# Patient Record
Sex: Male | Born: 1970 | Race: White | Hispanic: No | Marital: Married | Smoking: Current every day smoker
Health system: Southern US, Community
[De-identification: ages and names within clinical notes are randomized; demographics above are authoritative.]

## PROBLEM LIST (undated history)

## (undated) DIAGNOSIS — N9989 Other postprocedural complications and disorders of genitourinary system: Secondary | ICD-10-CM

## (undated) DIAGNOSIS — K56609 Unspecified intestinal obstruction, unspecified as to partial versus complete obstruction: Secondary | ICD-10-CM

## (undated) DIAGNOSIS — T8859XA Other complications of anesthesia, initial encounter: Secondary | ICD-10-CM

## (undated) DIAGNOSIS — G4700 Insomnia, unspecified: Secondary | ICD-10-CM

## (undated) DIAGNOSIS — Z933 Colostomy status: Secondary | ICD-10-CM

## (undated) DIAGNOSIS — K651 Peritoneal abscess: Secondary | ICD-10-CM

## (undated) DIAGNOSIS — T4145XA Adverse effect of unspecified anesthetic, initial encounter: Secondary | ICD-10-CM

## (undated) DIAGNOSIS — K5792 Diverticulitis of intestine, part unspecified, without perforation or abscess without bleeding: Secondary | ICD-10-CM

## (undated) DIAGNOSIS — Z8639 Personal history of other endocrine, nutritional and metabolic disease: Secondary | ICD-10-CM

## (undated) DIAGNOSIS — Z87442 Personal history of urinary calculi: Secondary | ICD-10-CM

---

## 2000-09-13 ENCOUNTER — Encounter: Payer: Self-pay | Admitting: Emergency Medicine

## 2000-09-13 ENCOUNTER — Emergency Department (HOSPITAL_COMMUNITY): Admission: EM | Admit: 2000-09-13 | Discharge: 2000-09-13 | Payer: Self-pay | Admitting: Emergency Medicine

## 2002-12-31 ENCOUNTER — Emergency Department (HOSPITAL_COMMUNITY): Admission: EM | Admit: 2002-12-31 | Discharge: 2002-12-31 | Payer: Self-pay | Admitting: Emergency Medicine

## 2009-08-25 ENCOUNTER — Emergency Department (HOSPITAL_COMMUNITY): Admission: EM | Admit: 2009-08-25 | Discharge: 2009-08-26 | Payer: Self-pay | Admitting: Emergency Medicine

## 2009-08-25 ENCOUNTER — Ambulatory Visit: Payer: Self-pay | Admitting: Diagnostic Radiology

## 2009-08-25 ENCOUNTER — Emergency Department (HOSPITAL_BASED_OUTPATIENT_CLINIC_OR_DEPARTMENT_OTHER): Admission: EM | Admit: 2009-08-25 | Discharge: 2009-08-25 | Payer: Self-pay | Admitting: Emergency Medicine

## 2010-07-06 LAB — URINE CULTURE
Colony Count: NO GROWTH
Culture: NO GROWTH

## 2010-07-06 LAB — BASIC METABOLIC PANEL
GFR calc Af Amer: >60 mL/min (ref >60)
GFR calc non Af Amer: >60 mL/min (ref >60)
Potassium: 3.8 mEq/L (ref 3.5–5.1)
Sodium: 142 mEq/L (ref 135–145)

## 2010-07-06 LAB — URINALYSIS, ROUTINE W REFLEX MICROSCOPIC
Bilirubin Urine: NEGATIVE
Glucose, UA: NEGATIVE mg/dL
Hgb urine dipstick: NEGATIVE
Ketones, ur: NEGATIVE mg/dL
Nitrite: POSITIVE — AB
Protein, ur: NEGATIVE mg/dL
Specific Gravity, Urine: 1.021 (ref 1.005–1.030)
Urobilinogen, UA: 1 mg/dL (ref 0.0–1.0)
pH: 6 (ref 5.0–8.0)

## 2010-07-06 LAB — URINE MICROSCOPIC-ADD ON

## 2011-03-27 ENCOUNTER — Ambulatory Visit: Payer: Self-pay

## 2011-12-23 ENCOUNTER — Emergency Department (HOSPITAL_COMMUNITY): Payer: Medicaid Other

## 2011-12-23 ENCOUNTER — Encounter (HOSPITAL_COMMUNITY): Payer: Self-pay | Admitting: *Deleted

## 2011-12-23 ENCOUNTER — Inpatient Hospital Stay (HOSPITAL_COMMUNITY)
Admission: EM | Admit: 2011-12-23 | Discharge: 2011-12-26 | DRG: 690 | Disposition: A | Discharge status: Home / Self Care | Payer: Medicaid Other | Attending: Internal Medicine | Admitting: Internal Medicine

## 2011-12-23 DIAGNOSIS — K5792 Diverticulitis of intestine, part unspecified, without perforation or abscess without bleeding: Secondary | ICD-10-CM

## 2011-12-23 DIAGNOSIS — F17200 Nicotine dependence, unspecified, uncomplicated: Secondary | ICD-10-CM | POA: Diagnosis present

## 2011-12-23 DIAGNOSIS — N390 Urinary tract infection, site not specified: Principal | ICD-10-CM | POA: Diagnosis present

## 2011-12-23 DIAGNOSIS — D72829 Elevated white blood cell count, unspecified: Secondary | ICD-10-CM | POA: Diagnosis present

## 2011-12-23 DIAGNOSIS — R109 Unspecified abdominal pain: Secondary | ICD-10-CM | POA: Diagnosis present

## 2011-12-23 DIAGNOSIS — K5732 Diverticulitis of large intestine without perforation or abscess without bleeding: Secondary | ICD-10-CM

## 2011-12-23 DIAGNOSIS — K56699 Other intestinal obstruction unspecified as to partial versus complete obstruction: Secondary | ICD-10-CM | POA: Diagnosis present

## 2011-12-23 DIAGNOSIS — E860 Dehydration: Secondary | ICD-10-CM | POA: Diagnosis present

## 2011-12-23 DIAGNOSIS — K5900 Constipation, unspecified: Secondary | ICD-10-CM | POA: Diagnosis present

## 2011-12-23 DIAGNOSIS — K5289 Other specified noninfective gastroenteritis and colitis: Secondary | ICD-10-CM | POA: Diagnosis present

## 2011-12-23 HISTORY — DX: Other postprocedural complications and disorders of genitourinary system: N99.89

## 2011-12-23 LAB — COMPREHENSIVE METABOLIC PANEL
AST: 17 U/L (ref 0–37)
CO2: 23 mEq/L (ref 19–32)
Chloride: 98 mEq/L (ref 96–112)
Creatinine, Ser: 0.79 mg/dL (ref 0.50–1.35)
GFR calc non Af Amer: >90 mL/min (ref >90)
Total Bilirubin: 0.4 mg/dL (ref 0.3–1.2)

## 2011-12-23 LAB — CBC WITH DIFFERENTIAL/PLATELET
Basophils Absolute: 0.1 10*3/uL (ref 0.0–0.1)
HCT: 46.7 % (ref 39.0–52.0)
Hemoglobin: 16.6 g/dL (ref 13.0–17.0)
Lymphocytes Relative: 17 % (ref 12–46)
Lymphs Abs: 2.5 10*3/uL (ref 0.7–4.0)
Monocytes Absolute: 1.4 10*3/uL — ABNORMAL HIGH (ref 0.1–1.0)
Monocytes Relative: 9 % (ref 3–12)
Neutro Abs: 11 10*3/uL — ABNORMAL HIGH (ref 1.7–7.7)
RBC: 5.08 MIL/uL (ref 4.22–5.81)
RDW: 12.6 % (ref 11.5–15.5)
WBC: 14.9 10*3/uL — ABNORMAL HIGH (ref 4.0–10.5)

## 2011-12-23 LAB — URINALYSIS, ROUTINE W REFLEX MICROSCOPIC
Glucose, UA: NEGATIVE mg/dL
Protein, ur: NEGATIVE mg/dL

## 2011-12-23 LAB — URINE MICROSCOPIC-ADD ON

## 2011-12-23 MED ORDER — IOHEXOL 300 MG/ML  SOLN
100.0000 mL | Freq: Once | INTRAMUSCULAR | Status: AC | PRN
  Administered 2011-12-23: 100 mL via INTRAVENOUS

## 2011-12-23 MED ORDER — LIDOCAINE HCL 2 % EX GEL
CUTANEOUS | Status: AC
  Administered 2011-12-23: 10
  Filled 2011-12-23: qty 10

## 2011-12-23 MED ORDER — DEXTROSE 5 % IV SOLN
1.0000 g | Freq: Once | INTRAVENOUS | Status: AC
  Administered 2011-12-24: 1 g via INTRAVENOUS
  Filled 2011-12-23: qty 10

## 2011-12-23 MED ORDER — HYDROMORPHONE HCL PF 1 MG/ML IJ SOLN
1.0000 mg | Freq: Once | INTRAMUSCULAR | Status: AC
  Administered 2011-12-23: 1 mg via INTRAVENOUS
  Filled 2011-12-23: qty 1

## 2011-12-23 MED ORDER — ONDANSETRON HCL 4 MG/2ML IJ SOLN
4.0000 mg | Freq: Once | INTRAMUSCULAR | Status: AC
  Administered 2011-12-23: 4 mg via INTRAVENOUS
  Filled 2011-12-23: qty 2

## 2011-12-23 NOTE — ED Notes (Signed)
Pt states that he has been unable to urinate for the past two days. Also, states that he has not been able to have a BM since Saturday.

## 2011-12-23 NOTE — ED Provider Notes (Signed)
History     CSN: [id number]  Arrival date & time 12/23/11  2036   First MD Initiated Contact with Patient 12/23/11 2100      Chief Complaint  Patient presents with  . Urinary Retention    (Consider location/radiation/quality/duration/timing/severity/associated sxs/prior treatment) HPI History provided by pt.   Pt c/o constipation and urinary retention.  Most recent BM 6 days ago and he normally goes daily.  Most recently urinated upon waking this morning, but only ~3oz, and before that it was yesterday morning.  His entire abdomen is painful, tender and distended but he does not feel as though he needs to urinate.  Appetite decreased, has had nausea with retching and has not been passing gas.  Denies fever and other GU sx.  No h/o abd surgeries.  No h/o liver disease and denies edema elsewhere.  Per prior chart, pt presented to ED w/ same in 08/2009.  Dr. 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  He diagnosed him w/ urinary retention.  Pt has had intermittent cystitis as well as prostatitis in the past.    Past Medical History  Diagnosis Date  . Urinary anastomotic stricture undiagnosed     hard to be cathed; urology did the last time    History reviewed. No pertinent past surgical history.  History reviewed. No pertinent family history.  History  Substance Use Topics  . Smoking status: Current Everyday Smoker -- 2.0 packs/day for 20 years    Types: Cigarettes  . Smokeless tobacco: Not on file  . Alcohol Use: No      Review of Systems  All other systems reviewed and are negative.    Allergies  Review of patient's allergies indicates no known allergies.  Home Medications   Current Outpatient Rx  Name Route Sig Dispense Refill  . NAPROXEN SODIUM 220 MG PO TABS Oral Take 440 mg by mouth 2 (two) times daily with a meal.      BP 154/99  Pulse 105  Temp 98.1 F (36.7 C) (Oral)  Resp 16  SpO2  96%  Physical Exam  Nursing note and vitals reviewed. Constitutional: He is oriented to person, place, and time. He appears well-developed and well-nourished.       Uncomfortable appearing.  HENT:  Head: Normocephalic and atraumatic.  Eyes:       Normal appearance  Neck: Normal range of motion.  Cardiovascular: Normal rate and regular rhythm.   Pulmonary/Chest: Effort normal and breath sounds normal. No respiratory distress.  Abdominal: Soft. Bowel sounds are normal. He exhibits distension. There is no tenderness. There is no guarding.       Obese. No surgical scars. Diffuse tenderness to light palpation with possible peritonitis (past w/ light tapping and shaking of hips).    Genitourinary:       No CVA tenderness but vibration from tapping induces abd pain.  Prostate exam limited by body habitus but patient denies tenderness.  Nml stool color and nml rectal tone.  No impaction or gross blood.   Musculoskeletal: Normal range of motion. He exhibits no edema and no tenderness.  Neurological: He is alert and oriented to person, place, and time.  Skin: Skin is warm and dry. No rash noted.       No jaundice  Psychiatric: He has a normal mood and affect. His behavior is normal.    ED Course  Procedures (including critical care time)  Labs Reviewed  CBC  WITH DIFFERENTIAL - Abnormal; Notable for the following:    WBC 14.9 (*)     Platelets 507 (*)     Neutro Abs 11.0 (*)     Monocytes Absolute 1.4 (*)     All other components within normal limits  COMPREHENSIVE METABOLIC PANEL - Abnormal; Notable for the following:    Sodium 134 (*)     Glucose, Bld 118 (*)     All other components within normal limits  LIPASE, BLOOD - Abnormal; Notable for the following:    Lipase 126 (*)     All other components within normal limits  URINALYSIS, ROUTINE W REFLEX MICROSCOPIC - Abnormal; Notable for the following:    Color, Urine ORANGE (*)  BIOCHEMICALS MAY BE AFFECTED BY COLOR   APPearance  CLOUDY (*)     Specific Gravity, Urine 1.034 (*)     Hgb urine dipstick MODERATE (*)     Bilirubin Urine SMALL (*)     Ketones, ur TRACE (*)     Nitrite POSITIVE (*)     Leukocytes, UA SMALL (*)     All other components within normal limits  URINE MICROSCOPIC-ADD ON - Abnormal; Notable for the following:    Bacteria, UA MANY (*)     All other components within normal limits  TSH   Ct Abdomen Pelvis W Contrast  12/23/2011  *RADIOLOGY REPORT*  Clinical Data: Abdominal pain.  Unable to urinate for 2 days.  No bowel movement since Saturday.  CT ABDOMEN AND PELVIS WITH CONTRAST  Technique:  Multidetector CT imaging of the abdomen and pelvis was performed following the standard protocol during bolus administration of intravenous contrast.  Contrast: OMNIPAQUE IOHEXOL 300 MG/ML  SOLN  Comparison: 08/25/2009  Findings: Mild dependent atelectasis in the lung bases.  The diffuse low attenuation change throughout the liver consistent with fatty infiltration.  The spleen, gallbladder, pancreas, adrenal glands, kidneys, abdominal aorta, and retroperitoneal lymph nodes are unremarkable.  No free air or free fluid in the abdomen. Prominent visceral adipose tissues.  Stomach and small bowel are not distended.  Mild gaseous distension of the colon with mild colonic wall thickening diffusely.  There is more prominent colonic wall thickening in the descending portion with pericolonic fatty infiltration around the descending region.  This is new since the previous study.  The colon is diffusely stool filled.  Changes most likely to represent inflammatory process such as infectious colitis, diverticulitis, or inflammatory bowel disease.  There is a focal area of narrowing in the descending region, follow-up evaluation of the colon after acute process has resolved is recommended to exclude underlying neoplasm.  There are a few scattered lymph nodes in the pericolonic region on the left which are not pathologically  enlarged and likely represent reactive nodes.  Pelvis:  There is a Foley catheter in the bladder.  Bladder wall does not appear thickened.  Increased density in the bladder may arise from blood or contrast material.  No free or loculated pelvic fluid collections.  The appendix is normal.  No significant pelvic lymphadenopathy.  Diffuse degenerative changes throughout the thoracolumbar spine. Prominent disc osteophyte complexes at multiple levels cause encroachment upon the thecal sac.  IMPRESSION: Inflammatory process focally involving the descending colon with diffuse colonic wall thickening and dilatation.  Changes suggest inflammatory bowel disease versus infectious colitis or diverticulitis.  Underlying neoplasm should be excluded and follow- up evaluation of the colon  after resolution of acute process is recommend   Original Report Authenticated  By: Marlon Pel, M.D.    Dg Abd Acute W/chest  12/23/2011  *RADIOLOGY REPORT*  Clinical Data: Abdominal pain.  Constipation.  ACUTE ABDOMEN SERIES (ABDOMEN 2 VIEW & CHEST 1 VIEW)  Comparison: None.  Findings: Mild diffuse colonic dilatation is seen.  Some stool is noted in the distal colon and right colon.  This could be due to a colonic ileus or distal colonic obstruction.  There is no evidence of free air.  No evidence of dilated small bowel loops.  Heart size is normal.  Both lungs are clear.  IMPRESSION:  1.  Diffuse colonic dilatation, with some stool seen in the distal and right colon.  Differential diagnosis includes colonic ileus and distal colonic obstruction. 2.  No active cardiopulmonary disease.   Original Report Authenticated By: Danae Orleans, M.D.      1. Diverticulitis   2. Urinary tract infection   3. Constipation   4. Dehydration       MDM  41yo M w/ h/o urinary retention secondary to urethral stricture presents w/ urinary retention x 2d and constipation x6d.  On exam, uncomfortable appearing, abd distended and diffusely tender  w/ possible peritonitis, nml DRE.  Foley inserted and approx 200cc urine removed.  Labs sig for mild leukocytosis and UTI.  Acute abd series shows possible distal colonic obstruction. CT abd/pelvis pending.  Pt received IV dilaudid and zofran w/ relief.   Rocephin administered for UTI.    Pt reports that pain and nausea are much improved.  CT shows descending colon diverticulitis vs. Colitis w/ focal area of narrowing that is concerning for neoplasm.  Results discussed w/ pt.  He received IV cipro and flagyl.  [facility] consulted for admission.  8:32 AM        Otilio Miu, PA 12/24/11 [phone]

## 2011-12-23 NOTE — ED Notes (Signed)
Patient transported to CT 

## 2011-12-24 DIAGNOSIS — E860 Dehydration: Secondary | ICD-10-CM | POA: Diagnosis present

## 2011-12-24 DIAGNOSIS — N390 Urinary tract infection, site not specified: Secondary | ICD-10-CM | POA: Diagnosis present

## 2011-12-24 DIAGNOSIS — D72829 Elevated white blood cell count, unspecified: Secondary | ICD-10-CM | POA: Diagnosis present

## 2011-12-24 DIAGNOSIS — F17200 Nicotine dependence, unspecified, uncomplicated: Secondary | ICD-10-CM | POA: Diagnosis present

## 2011-12-24 DIAGNOSIS — K5289 Other specified noninfective gastroenteritis and colitis: Secondary | ICD-10-CM | POA: Diagnosis present

## 2011-12-24 DIAGNOSIS — R109 Unspecified abdominal pain: Secondary | ICD-10-CM | POA: Diagnosis present

## 2011-12-24 DIAGNOSIS — K5900 Constipation, unspecified: Secondary | ICD-10-CM | POA: Diagnosis present

## 2011-12-24 DIAGNOSIS — K56699 Other intestinal obstruction unspecified as to partial versus complete obstruction: Secondary | ICD-10-CM | POA: Diagnosis present

## 2011-12-24 DIAGNOSIS — K5732 Diverticulitis of large intestine without perforation or abscess without bleeding: Secondary | ICD-10-CM | POA: Diagnosis present

## 2011-12-24 LAB — TSH: TSH: 1.537 u[IU]/mL (ref 0.350–4.500)

## 2011-12-24 MED ORDER — FLEET ENEMA 7-19 GM/118ML RE ENEM
1.0000 | ENEMA | Freq: Once | RECTAL | Status: AC | PRN
  Administered 2011-12-24: 1 via RECTAL
  Filled 2011-12-24: qty 1

## 2011-12-24 MED ORDER — HYDROMORPHONE HCL PF 1 MG/ML IJ SOLN
1.0000 mg | INTRAMUSCULAR | Status: DC | PRN
  Administered 2011-12-24 – 2011-12-26 (×8): 1 mg via INTRAVENOUS
  Filled 2011-12-24 (×8): qty 1

## 2011-12-24 MED ORDER — SODIUM CHLORIDE 0.9 % IV BOLUS (SEPSIS)
1000.0000 mL | Freq: Once | INTRAVENOUS | Status: AC
  Administered 2011-12-24: 1000 mL via INTRAVENOUS

## 2011-12-24 MED ORDER — CIPROFLOXACIN IN D5W 400 MG/200ML IV SOLN: 400.0000 mg | Freq: Two times a day (BID) | INTRAVENOUS | Status: DC

## 2011-12-24 MED ORDER — PEG 3350-KCL-NA BICARB-NACL 420 G PO SOLR
4000.0000 mL | Freq: Once | ORAL | Status: AC
  Administered 2011-12-24: 4000 mL via ORAL
  Filled 2011-12-24: qty 4000

## 2011-12-24 MED ORDER — CIPROFLOXACIN IN D5W 400 MG/200ML IV SOLN
400.0000 mg | Freq: Two times a day (BID) | INTRAVENOUS | Status: DC
  Administered 2011-12-24 – 2011-12-26 (×4): 400 mg via INTRAVENOUS
  Filled 2011-12-24 (×6): qty 200

## 2011-12-24 MED ORDER — ALUM & MAG HYDROXIDE-SIMETH 200-200-20 MG/5ML PO SUSP
30.0000 mL | Freq: Once | ORAL | Status: AC
  Administered 2011-12-24: 30 mL via ORAL
  Filled 2011-12-24: qty 30

## 2011-12-24 MED ORDER — BISACODYL 10 MG RE SUPP
10.0000 mg | Freq: Every day | RECTAL | Status: DC | PRN
  Administered 2011-12-24: 10 mg via RECTAL
  Filled 2011-12-24: qty 1

## 2011-12-24 MED ORDER — ONDANSETRON HCL 4 MG/2ML IJ SOLN
4.0000 mg | Freq: Four times a day (QID) | INTRAMUSCULAR | Status: DC | PRN
  Administered 2011-12-24 – 2011-12-25 (×5): 4 mg via INTRAVENOUS
  Filled 2011-12-24 (×5): qty 2

## 2011-12-24 MED ORDER — METRONIDAZOLE IN NACL 5-0.79 MG/ML-% IV SOLN
500.0000 mg | Freq: Three times a day (TID) | INTRAVENOUS | Status: DC
  Administered 2011-12-24 – 2011-12-26 (×7): 500 mg via INTRAVENOUS
  Filled 2011-12-24 (×8): qty 100

## 2011-12-24 MED ORDER — METRONIDAZOLE IN NACL 5-0.79 MG/ML-% IV SOLN
500.0000 mg | Freq: Three times a day (TID) | INTRAVENOUS | Status: DC
  Filled 2011-12-24: qty 100

## 2011-12-24 MED ORDER — CIPROFLOXACIN IN D5W 400 MG/200ML IV SOLN
400.0000 mg | Freq: Once | INTRAVENOUS | Status: AC
  Administered 2011-12-24: 400 mg via INTRAVENOUS
  Filled 2011-12-24: qty 200

## 2011-12-24 MED ORDER — METRONIDAZOLE IN NACL 5-0.79 MG/ML-% IV SOLN
500.0000 mg | Freq: Once | INTRAVENOUS | Status: AC
  Administered 2011-12-24: 500 mg via INTRAVENOUS
  Filled 2011-12-24: qty 100

## 2011-12-24 MED ORDER — BISACODYL 10 MG RE SUPP
10.0000 mg | Freq: Once | RECTAL | Status: AC
  Administered 2011-12-24: 10 mg via RECTAL
  Filled 2011-12-24: qty 1

## 2011-12-24 MED ORDER — DEXTROSE-NACL 5-0.9 % IV SOLN
INTRAVENOUS | Status: DC
  Administered 2011-12-24 – 2011-12-25 (×3): via INTRAVENOUS

## 2011-12-24 MED ORDER — ONDANSETRON HCL 4 MG PO TABS: 4.0000 mg | ORAL_TABLET | Freq: Four times a day (QID) | ORAL | Status: DC | PRN

## 2011-12-24 NOTE — Progress Notes (Addendum)
Addendum to admission note  Assessment and Plan: - reviewed assessment and plan done by my colleague; we will continue cipro and flagyl for possible colitis.  Manson Passey [facility] Pager [phone]

## 2011-12-24 NOTE — ED Provider Notes (Signed)
Medical screening examination/treatment/procedure(s) were conducted as a shared visit with non-physician practitioner(s) and myself.  I personally evaluated the patient during the encounter  Decreased urine output with abdomina distention and obstipation.  Obese, distended nontender abdomen.  Glynn Octave, MD 12/24/11 1102

## 2011-12-24 NOTE — Progress Notes (Signed)
Patient arrived to unit via stretcher from ED accompanied by wife at 59. Patient is A&Ox3, c/o abdominal 6/10 and lower back 7/10 pain~Dilaudid 1mg  IV administered. R hand PIV patent and infusing D5NS@125ml /hr. Foley intact draining clear amber urine. Lungs diminished, bsx4 faint, LBM 12/17/11. Abdomen distended and tender. Skin intact with small scratches noted to BLE per patient from cat. +pedal pulses, no edema noted. Multiple tattoo's to BUE. No advanced directive. Contact person~wife~Ashley Wilson [phone]. Bed in lower position with breaks locked, side rails up x2. Patient resting quietly. Patient expressed he prefers to watch safety video in AM. ~C.Lilyannah Zuelke, RN

## 2011-12-24 NOTE — H&P (Signed)
[facility] History and Physical  YONY ROULSTON ZOX:[id number] DOB: 19-Jun-1970    PCP:   No primary provider on file.   Chief Complaint: abdominal pain.  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  He said his pain started in his lower abdomen, and radiated difusely.  He also noted he hadn't been able to move his bowel for close to a week.  This is very unusual for him.  He noted that he had decreased in his urinary output as well.  He didn't have any fever or chills.  Evaluation in the ER showed his CT of the abdomen and Pelvis showing severe constipation, new colonic wall thickening and increased pericolic fatty infiltration.  He has leukocytosis with a WBC of 16K.  His UA is indicating he has a UTI as well.  In the ER, he was given Rocephin for UTI, then with the result of his CT, he was given Cipro and Flagyl for ? Diverticultis.  Hospitalist was asked to admit him as he may benefit from getting a colonoscopy sooner, and he has no primary physician.   He actually came on no chronic medication, and has not had a PCP in over 2 years.  Rewiew of Systems:  Constitutional: Negative for malaise, fever and chills. No significant weight loss or weight gain Eyes: Negative for eye pain, redness and discharge, diplopia, visual changes, or flashes of light. ENMT: Negative for ear pain, hoarseness, nasal congestion, sinus pressure and sore throat. No headaches; tinnitus, drooling, or problem swallowing. Cardiovascular: Negative for chest pain, palpitations, diaphoresis, dyspnea and peripheral edema. ; No orthopnea, PND Respiratory: Negative for cough, hemoptysis, wheezing and stridor. No pleuritic chestpain. Gastrointestinal: Negative for nausea, vomiting, diarrhea, melena, blood in stool, hematemesis, jaundice and rectal bleeding.    Genitourinary: Negative for  frequency, dysuria, incontinence,flank pain and hematuria; Musculoskeletal: Negative for back pain and neck pain. Negative for swelling and trauma.;  Skin: . Negative for pruritus, rash, abrasions, bruising and skin lesion.; ulcerations Neuro: Negative for headache, lightheadedness and neck stiffness. Negative for weakness, altered level of consciousness , altered mental status, extremity weakness, burning feet, involuntary movement, seizure and syncope.  Psych: negative for anxiety, depression, insomnia, tearfulness, panic attacks, hallucinations, paranoia, suicidal or homicidal ideation    Past Medical History  Diagnosis Date  . Urinary anastomotic stricture undiagnosed     hard to be cathed; urology did the last time    History reviewed. No pertinent past surgical history.  Medications:  HOME MEDS: Prior to Admission medications   Medication Sig Start Date End Date Taking? Authorizing Provider  naproxen sodium (ANAPROX) 220 MG tablet Take 440 mg by mouth 2 (two) times daily with a meal.   Yes Historical Provider, MD     Allergies:  No Known Allergies  Social History:   reports that he has been smoking Cigarettes.  He has a 40 pack-year smoking history. He does not have any smokeless tobacco history on file. He reports that he does not drink alcohol. His drug history not on file.  Family History: History reviewed. No pertinent family history.   Physical Exam: Filed Vitals:   12/23/11 2051 12/24/11 0053 12/24/11 0245 12/24/11 0603  BP: 154/99 127/68 152/91 137/73  Pulse: 105 74 83 78  Temp: 98.1 F (36.7 C) 98.5 F (36.9 C) 97.9 F (36.6 C) 98 F (36.7 C)  TempSrc:  Oral Oral Oral Oral  Resp: 16 16 19 17   Height:   5\' 10"  (1.778 m)   Weight:   138 kg (304 lb 3.8 oz)   SpO2: 96% 95% 98% 94%   Blood pressure 137/73, pulse 78, temperature 98 F (36.7 C), temperature source Oral, resp. rate 17, height 5\' 10"  (1.778 m), weight 138 kg (304 lb 3.8 oz), SpO2 94.00%.  GEN:   Pleasant obese patient lying in the stretcher in no acute distress; cooperative with exam. PSYCH:  alert and oriented x4; does not appear anxious or depressed; affect is appropriate. HEENT: Mucous membranes pink and anicteric; PERRLA; EOM intact; no cervical lymphadenopathy nor thyromegaly or carotid bruit; no JVD; There were no stridor. Neck is very supple. Breasts:: Not examined CHEST WALL: No tenderness CHEST: Normal respiration, clear to auscultation bilaterally.  HEART: Regular rate and rhythm.  There are no murmur, rub, or gallops.   BACK: No kyphosis or scoliosis; no CVA tenderness ABDOMEN: soft and non-tender; no masses, no organomegaly, normal abdominal bowel sounds; no pannus; no intertriginous candida. There is no rebound and no distention. His abdomen is quite obese. Rectal Exam: Not done EXTREMITIES: No bone or joint deformity; age-appropriate arthropathy of the hands and knees; no edema; no ulcerations.  There is no calf tenderness. Genitalia: not examined PULSES: 2+ and symmetric SKIN: Normal hydration no rash or ulceration CNS: Cranial nerves 2-12 grossly intact no focal lateralizing neurologic deficit.  Speech is fluent; uvula elevated with phonation, facial symmetry and tongue midline. DTR are normal bilaterally, cerebella exam is intact, barbinski is negative and strengths are equaled bilaterally.  No sensory loss.   Labs on Admission:  Basic Metabolic Panel:  Lab 12/23/11 0981  NA 134*  K 3.5  CL 98  CO2 23  GLUCOSE 118*  BUN 10  CREATININE 0.79  CALCIUM 9.5  MG --  PHOS --   Liver Function Tests:  Lab 12/23/11 2132  AST 17  ALT 33  ALKPHOS 102  BILITOT 0.4  PROT 7.7  ALBUMIN 3.8    Lab 12/23/11 2132  LIPASE 126*  AMYLASE --   No results found for this basename: AMMONIA:5 in the last 168 hours CBC:  Lab 12/23/11 2132  WBC 14.9*  NEUTROABS 11.0*  HGB 16.6  HCT 46.7  MCV 91.9  PLT 507*   Cardiac Enzymes: No results found for this basename:  CKTOTAL:5,CKMB:5,CKMBINDEX:5,TROPONINI:5 in the last 168 hours  CBG: No results found for this basename: GLUCAP:5 in the last 168 hours   Radiological Exams on Admission: Ct Abdomen Pelvis W Contrast  12/23/2011  *RADIOLOGY REPORT*  Clinical Data: Abdominal pain.  Unable to urinate for 2 days.  No bowel movement since Saturday.  CT ABDOMEN AND PELVIS WITH CONTRAST  Technique:  Multidetector CT imaging of the abdomen and pelvis was performed following the standard protocol during bolus administration of intravenous contrast.  Contrast: OMNIPAQUE IOHEXOL 300 MG/ML  SOLN  Comparison: 08/25/2009  Findings: Mild dependent atelectasis in the lung bases.  The diffuse low attenuation change throughout the liver consistent with fatty infiltration.  The spleen, gallbladder, pancreas, adrenal glands, kidneys, abdominal aorta, and retroperitoneal lymph nodes are unremarkable.  No free air or free fluid in the abdomen. Prominent visceral adipose tissues.  Stomach and small bowel are not distended.  Mild gaseous distension of the colon with mild colonic wall thickening diffusely.  There is more prominent colonic wall thickening in the descending portion with pericolonic fatty infiltration around the descending region.  This is new since the previous study.  The colon is diffusely stool filled.  Changes most likely to represent inflammatory process such as infectious colitis, diverticulitis, or inflammatory bowel disease.  There is a focal area of narrowing in the descending region, follow-up evaluation of the colon after acute process has resolved is recommended to exclude underlying neoplasm.  There are a few scattered lymph nodes in the pericolonic region on the left which are not pathologically enlarged and likely represent reactive nodes.  Pelvis:  There is a Foley catheter in the bladder.  Bladder wall does not appear thickened.  Increased density in the bladder may arise from blood or contrast material.  No  free or loculated pelvic fluid collections.  The appendix is normal.  No significant pelvic lymphadenopathy.  Diffuse degenerative changes throughout the thoracolumbar spine. Prominent disc osteophyte complexes at multiple levels cause encroachment upon the thecal sac.  IMPRESSION: Inflammatory process focally involving the descending colon with diffuse colonic wall thickening and dilatation.  Changes suggest inflammatory bowel disease versus infectious colitis or diverticulitis.  Underlying neoplasm should be excluded and follow- up evaluation of the colon  after resolution of acute process is recommend   Original Report Authenticated By: Marlon Pel, M.D.    Dg Abd Acute W/chest  12/23/2011  *RADIOLOGY REPORT*  Clinical Data: Abdominal pain.  Constipation.  ACUTE ABDOMEN SERIES (ABDOMEN 2 VIEW & CHEST 1 VIEW)  Comparison: None.  Findings: Mild diffuse colonic dilatation is seen.  Some stool is noted in the distal colon and right colon.  This could be due to a colonic ileus or distal colonic obstruction.  There is no evidence of free air.  No evidence of dilated small bowel loops.  Heart size is normal.  Both lungs are clear.  IMPRESSION:  1.  Diffuse colonic dilatation, with some stool seen in the distal and right colon.  Differential diagnosis includes colonic ileus and distal colonic obstruction. 2.  No active cardiopulmonary disease.   Original Report Authenticated By: Danae Orleans, M.D.      Assessment/Plan Present on Admission:  .UTI (lower urinary tract infection) .Colitis .Constipation .Dehydration .Diverticulitis of colon without hemorrhage   PLAN:  This man has several problems.  He was having decrease urinary output and has a UTI.  He also has abdominal pain with severe change in his bowel habit, along with CT scan subtle abnormality.  He will be treated with Cipro and Flagyl, which will cover both diverticulitis and UTI.  For his low urinary output, will give him fluid challege.   I do think he needs to have a colonoscopy if we can clean out his bowel.  I will start with laxative and will give him a bowel regimen.  He doesn't use any narcotics so it is not the cause of his constipation.  He is stable, full code, and will be admitted to [facility] service.  Other plans as per orders.  Code Status: FULL Unk Lightning, MD. [facility] Pager [phone] 7pm to 7am.  12/24/2011, 6:24 AM

## 2011-12-24 NOTE — Progress Notes (Signed)
On-call doctor paged informing of patients arrival to floor.

## 2011-12-25 DIAGNOSIS — R109 Unspecified abdominal pain: Secondary | ICD-10-CM

## 2011-12-25 LAB — CBC
HCT: 44.9 % (ref 39.0–52.0)
MCHC: 33.9 g/dL (ref 30.0–36.0)
Platelets: 445 10*3/uL — ABNORMAL HIGH (ref 150–400)
RDW: 12.9 % (ref 11.5–15.5)
WBC: 9.5 10*3/uL (ref 4.0–10.5)

## 2011-12-25 LAB — BASIC METABOLIC PANEL
BUN: 10 mg/dL (ref 6–23)
Chloride: 98 mEq/L (ref 96–112)
Creatinine, Ser: 0.75 mg/dL (ref 0.50–1.35)
GFR calc Af Amer: >90 mL/min (ref >90)
GFR calc non Af Amer: >90 mL/min (ref >90)

## 2011-12-25 MED ORDER — PNEUMOCOCCAL VAC POLYVALENT 25 MCG/0.5ML IJ INJ
0.5000 mL | INJECTION | INTRAMUSCULAR | Status: AC
  Administered 2011-12-26: 0.5 mL via INTRAMUSCULAR
  Filled 2011-12-25: qty 0.5

## 2011-12-25 NOTE — Progress Notes (Signed)
[facility] PROGRESS NOTE  [name] UYQ:[id number] DOB: 01-07-1971 DOA: 12/23/2011 PCP: No primary provider on file.  Brief narrative: Patient admitted for persistent abdominal pain, found to have diverticulitis.  Assessment/Plan:  Principal Problem:  *Abdominal pain - secondary to possible diverticulitis - continue cipro and flagyl - patient had BM today - continue current bowel regimen  Active Problems  UTI (lower urinary tract infection) - continue cipro  Code Status: full code Family Communication: updated family at bedside Disposition Plan: home in 24 hours  Manson Passey, MD  [facility] Pager [phone]  If 7PM-7AM, please contact night-coverage [email or web address] Password [facility] 12/25/2011, 12:22 PM   LOS: 2 days   HPI/Subjective: Feels better today, had BM in am,  Objective: Filed Vitals:   12/24/11 0603 12/24/11 1352 12/24/11 2055 12/25/11 0540  BP: 137/73 139/84 137/74 132/82  Pulse: 78 85 94 78  Temp: 98 F (36.7 C) 98.2 F (36.8 C) 99 F (37.2 C) 98.6 F (37 C)  TempSrc: Oral Oral Oral Oral  Resp: 17 20 18 18   Height:      Weight:      SpO2: 94% 98% 94% 98%    Intake/Output Summary (Last 24 hours) at 12/25/11 1222 Last data filed at 12/25/11 0540  Gross per 24 hour  Intake 993.75 ml  Output    800 ml  Net 193.75 ml    Exam:   General:  Pt is alert, follows commands appropriately, not in acute distress  Cardiovascular: Regular rate and rhythm, S1/S2, no murmurs, no rubs, no gallops  Respiratory: Clear to auscultation bilaterally, no wheezing, no crackles, no rhonchi  Abdomen: Soft, mid abdomen tenderness, non distended, bowel sounds present, no guarding  Extremities: No edema, pulses DP and PT palpable bilaterally  Neuro: Grossly nonfocal  Data Reviewed: Basic Metabolic Panel:  Lab 12/25/11 3329 12/23/11 2132  NA 134* 134*  K 4.0 3.5  CL 98 98  CO2 28 23  GLUCOSE 153* 118*  BUN 10 10  CREATININE 0.75 0.79  CALCIUM 8.8 9.5     Liver Function Tests:  Lab 12/23/11 2132  AST 17  ALT 33  ALKPHOS 102  BILITOT 0.4  PROT 7.7  ALBUMIN 3.8    Lab 12/23/11 2132  LIPASE 126*  AMYLASE --   CBC:  Lab 12/25/11 0404 12/23/11 2132  WBC 9.5 14.9*  HGB 15.2 16.6  HCT 44.9 46.7  MCV 93.3 91.9  PLT 445* 507*    Studies: Ct Abdomen Pelvis W Contrast 12/23/2011  * IMPRESSION: Inflammatory process focally involving the descending colon with diffuse colonic wall thickening and dilatation.  Changes suggest inflammatory bowel disease versus infectious colitis or diverticulitis.  Underlying neoplasm should be excluded and follow- up evaluation of the colon  after resolution of acute process is recommend     Dg Abd Acute W/chest 12/23/2011  * IMPRESSION:  1.  Diffuse colonic dilatation, with some stool seen in the distal and right colon.  Differential diagnosis includes colonic ileus and distal colonic obstruction. 2.  No active cardiopulmonary disease.      Scheduled Meds:   . alum & mag hydroxide-simeth  30 mL Oral Once  . bisacodyl  10 mg Rectal Once  . ciprofloxacin  400 mg Intravenous Q12H  . metronidazole  500 mg Intravenous Q8H  . polyethylene glycol-electrolytes  4,000 mL Oral Once   Continuous Infusions:   . dextrose 5 % and 0.9% NaCl 125 mL/hr at 12/25/11 0950

## 2011-12-25 NOTE — Progress Notes (Signed)
Notified physician about foley. To discontinue foley. Will continue to monitor urine output throughout shift.

## 2011-12-26 MED ORDER — CIPROFLOXACIN HCL 500 MG PO TABS: 500.0000 mg | ORAL_TABLET | Freq: Two times a day (BID) | ORAL | Status: DC

## 2011-12-26 MED ORDER — BISACODYL 10 MG RE SUPP: 10.0000 mg | Freq: Every day | RECTAL | Status: DC | PRN

## 2011-12-26 MED ORDER — HYDROCODONE-ACETAMINOPHEN 5-325 MG PO TABS: 1.0000 | ORAL_TABLET | Freq: Four times a day (QID) | ORAL | Status: DC | PRN

## 2011-12-26 MED ORDER — METRONIDAZOLE 500 MG PO TABS: 500.0000 mg | ORAL_TABLET | Freq: Three times a day (TID) | ORAL | Status: DC

## 2011-12-26 NOTE — Progress Notes (Signed)
Pt discharged home. All paperwork has been discussed and questions have been answered. Patient refused wheelchair upon discharge and ambulated downstairs with family.

## 2011-12-26 NOTE — Discharge Summary (Signed)
Physician Discharge Summary  [name] ZOX:[id number] DOB: 1971-02-26 DOA: 12/23/2011  PCP: No primary provider on file.  Admit date: 12/23/2011 Discharge date: 12/26/2011  Recommendations for Outpatient Follow-up:  1. Patient will have to followup with primary care provider in about one to 2 weeks after discharge  Discharge Diagnoses:  Principal Problem:  *Abdominal pain Active Problems:  Diverticulitis  UTI (lower urinary tract infection)  Constipation  Diverticulitis of colon without hemorrhage  Discharge Condition: Medically stable and clinically appears well for discharge home today  Diet recommendation: As tolerated  History of present illness:  Patient admitted for persistent abdominal pain, found to have diverticulitis.   Assessment/Plan:   Principal Problem:  *Abdominal pain  - secondary to possible diverticulitis  - continue cipro and flagyl for additional 7 days on discharge - patient had BM today  - continue current bowel regimen with bisacodyl daily as needed  Active Problems  UTI (lower urinary tract infection)  - continue cipro as mentioned above  Code Status: full code  Family Communication: updated family at bedside  Disposition Plan: home today  Discharge Exam: Filed Vitals:   12/26/11 0515  BP: 121/74  Pulse: 75  Temp: 98 F (36.7 C)  Resp: 18   Filed Vitals:   12/25/11 0540 12/25/11 1350 12/25/11 2055 12/26/11 0515  BP: 132/82 131/85 112/59 121/74  Pulse: 78 80 69 75  Temp: 98.6 F (37 C) 97.6 F (36.4 C) 98.5 F (36.9 C) 98 F (36.7 C)  TempSrc: Oral Oral Oral Oral  Resp: 18 20 17 18   Height:      Weight:      SpO2: 98% 94% 97% 96%    General: Pt is alert, follows commands appropriately, not in acute distress Cardiovascular: Regular rate and rhythm, S1/S2 +, no murmurs, no rubs, no gallops Respiratory: Clear to auscultation bilaterally, no wheezing, no crackles, no rhonchi Abdominal: Soft, non tender, non distended, bowel  sounds +, no guarding Extremities: no edema, no cyanosis, pulses palpable bilaterally DP and PT Neuro: Grossly nonfocal  Discharge Instructions  Discharge Orders    Future Orders Please Complete By Expires   Diet - low sodium heart healthy      Increase activity slowly      Call MD for:  persistant nausea and vomiting      Call MD for:  severe uncontrolled pain      Call MD for:  difficulty breathing, headache or visual disturbances      Call MD for:  persistant dizziness or light-headedness        Medication List  As of 12/26/2011 11:33 AM   TAKE these medications         bisacodyl 10 MG suppository   Commonly known as: DULCOLAX   Place 1 suppository (10 mg total) rectally daily as needed for constipation.      ciprofloxacin 500 MG tablet   Commonly known as: CIPRO   Take 1 tablet (500 mg total) by mouth 2 (two) times daily.      HYDROcodone-acetaminophen 5-325 MG per tablet   Commonly known as: NORCO/VICODIN   Take 1 tablet by mouth every 6 (six) hours as needed for pain.      metroNIDAZOLE 500 MG tablet   Commonly known as: FLAGYL   Take 1 tablet (500 mg total) by mouth 3 (three) times daily.      naproxen sodium 220 MG tablet   Commonly known as: ANAPROX   Take 440 mg by mouth 2 (two)  times daily with a meal.              The results of significant diagnostics from this hospitalization (including imaging, microbiology, ancillary and laboratory) are listed below for reference.    Significant Diagnostic Studies: Ct Abdomen Pelvis W Contrast  12/23/2011  *RADIOLOGY REPORT*  Clinical Data: Abdominal pain.  Unable to urinate for 2 days.  No bowel movement since Saturday.  CT ABDOMEN AND PELVIS WITH CONTRAST  Technique:  Multidetector CT imaging of the abdomen and pelvis was performed following the standard protocol during bolus administration of intravenous contrast.  Contrast: OMNIPAQUE IOHEXOL 300 MG/ML  SOLN  Comparison: 08/25/2009  Findings: Mild dependent  atelectasis in the lung bases.  The diffuse low attenuation change throughout the liver consistent with fatty infiltration.  The spleen, gallbladder, pancreas, adrenal glands, kidneys, abdominal aorta, and retroperitoneal lymph nodes are unremarkable.  No free air or free fluid in the abdomen. Prominent visceral adipose tissues.  Stomach and small bowel are not distended.  Mild gaseous distension of the colon with mild colonic wall thickening diffusely.  There is more prominent colonic wall thickening in the descending portion with pericolonic fatty infiltration around the descending region.  This is new since the previous study.  The colon is diffusely stool filled.  Changes most likely to represent inflammatory process such as infectious colitis, diverticulitis, or inflammatory bowel disease.  There is a focal area of narrowing in the descending region, follow-up evaluation of the colon after acute process has resolved is recommended to exclude underlying neoplasm.  There are a few scattered lymph nodes in the pericolonic region on the left which are not pathologically enlarged and likely represent reactive nodes.  Pelvis:  There is a Foley catheter in the bladder.  Bladder wall does not appear thickened.  Increased density in the bladder may arise from blood or contrast material.  No free or loculated pelvic fluid collections.  The appendix is normal.  No significant pelvic lymphadenopathy.  Diffuse degenerative changes throughout the thoracolumbar spine. Prominent disc osteophyte complexes at multiple levels cause encroachment upon the thecal sac.  IMPRESSION: Inflammatory process focally involving the descending colon with diffuse colonic wall thickening and dilatation.  Changes suggest inflammatory bowel disease versus infectious colitis or diverticulitis.  Underlying neoplasm should be excluded and follow- up evaluation of the colon  after resolution of acute process is recommend   Original Report  Authenticated By: Marlon Pel, M.D.    Dg Abd Acute W/chest  12/23/2011  *RADIOLOGY REPORT*  Clinical Data: Abdominal pain.  Constipation.  ACUTE ABDOMEN SERIES (ABDOMEN 2 VIEW & CHEST 1 VIEW)  Comparison: None.  Findings: Mild diffuse colonic dilatation is seen.  Some stool is noted in the distal colon and right colon.  This could be due to a colonic ileus or distal colonic obstruction.  There is no evidence of free air.  No evidence of dilated small bowel loops.  Heart size is normal.  Both lungs are clear.  IMPRESSION:  1.  Diffuse colonic dilatation, with some stool seen in the distal and right colon.  Differential diagnosis includes colonic ileus and distal colonic obstruction. 2.  No active cardiopulmonary disease.   Original Report Authenticated By: Danae Orleans, M.D.     Microbiology: No results found for this or any previous visit (from the past 240 hour(s)).   Labs: Basic Metabolic Panel:  Lab 12/25/11 0981 12/23/11 2132  NA 134* 134*  K 4.0 3.5  CL 98 98  CO2 28 23  GLUCOSE 153* 118*  BUN 10 10  CREATININE 0.75 0.79  CALCIUM 8.8 9.5  MG -- --  PHOS -- --   Liver Function Tests:  Lab 12/23/11 2132  AST 17  ALT 33  ALKPHOS 102  BILITOT 0.4  PROT 7.7  ALBUMIN 3.8    Lab 12/23/11 2132  LIPASE 126*  AMYLASE --   CBC:  Lab 12/25/11 0404 12/23/11 2132  WBC 9.5 14.9*  NEUTROABS -- 11.0*  HGB 15.2 16.6  HCT 44.9 46.7  MCV 93.3 91.9  PLT 445* 507*    Time coordinating discharge: Over 30 minutes  Signed:  Manson Passey, MD  [facility] 12/26/2011, 11:33 AM  Pager #: [phone]

## 2011-12-26 NOTE — Progress Notes (Signed)
UR done. 

## 2011-12-26 NOTE — Plan of Care (Signed)
Problem: Food- and Nutrition-Related Knowledge Deficit (NB-1.1) Goal: Nutrition education Formal process to instruct or train a patient/client in a skill or to impart knowledge to help patients/clients voluntarily manage or modify food choices and eating behavior to maintain or improve health.  Outcome: Completed/Met Date Met:  12/26/11 Met with pt and family to discuss diet therapy for diverticulitis. Discussed relationship between fiber and diverticulitis and provided education for low fiber nutrition therapy when pt has GI symptoms. Discussed low fiber options in every food group and provided handout of this information. RD contact information provided. Pt and family asked appropriate questions and expressed understanding of information. Expect good compliance.

## 2012-01-06 ENCOUNTER — Emergency Department (HOSPITAL_COMMUNITY)
Admission: EM | Admit: 2012-01-06 | Discharge: 2012-01-07 | Disposition: A | Discharge status: Home / Self Care | Payer: Medicaid Other | Attending: Emergency Medicine | Admitting: Emergency Medicine

## 2012-01-06 ENCOUNTER — Encounter (HOSPITAL_COMMUNITY): Payer: Self-pay | Admitting: Emergency Medicine

## 2012-01-06 ENCOUNTER — Emergency Department (HOSPITAL_COMMUNITY): Payer: Medicaid Other

## 2012-01-06 DIAGNOSIS — E860 Dehydration: Secondary | ICD-10-CM | POA: Insufficient documentation

## 2012-01-06 DIAGNOSIS — M545 Low back pain, unspecified: Secondary | ICD-10-CM | POA: Insufficient documentation

## 2012-01-06 DIAGNOSIS — R000 Tachycardia, unspecified: Secondary | ICD-10-CM | POA: Insufficient documentation

## 2012-01-06 DIAGNOSIS — M549 Dorsalgia, unspecified: Secondary | ICD-10-CM

## 2012-01-06 DIAGNOSIS — R509 Fever, unspecified: Secondary | ICD-10-CM | POA: Diagnosis present

## 2012-01-06 LAB — CBC WITH DIFFERENTIAL/PLATELET
Eosinophils Absolute: 0 10*3/uL (ref 0.0–0.7)
Hemoglobin: 16.2 g/dL (ref 13.0–17.0)
Lymphocytes Relative: 15 % (ref 12–46)
Lymphs Abs: 2.6 10*3/uL (ref 0.7–4.0)
Monocytes Relative: 8 % (ref 3–12)
Neutro Abs: 13.3 10*3/uL — ABNORMAL HIGH (ref 1.7–7.7)
Neutrophils Relative %: 76 % (ref 43–77)
Platelets: 403 10*3/uL — ABNORMAL HIGH (ref 150–400)
RBC: 5.05 MIL/uL (ref 4.22–5.81)
WBC: 17.4 10*3/uL — ABNORMAL HIGH (ref 4.0–10.5)

## 2012-01-06 LAB — URINALYSIS, ROUTINE W REFLEX MICROSCOPIC
Glucose, UA: NEGATIVE mg/dL
Nitrite: NEGATIVE
Protein, ur: NEGATIVE mg/dL
pH: 6 (ref 5.0–8.0)

## 2012-01-06 LAB — COMPREHENSIVE METABOLIC PANEL
ALT: 22 U/L (ref 0–53)
Alkaline Phosphatase: 69 U/L (ref 39–117)
BUN: 8 mg/dL (ref 6–23)
Chloride: 96 mEq/L (ref 96–112)
GFR calc Af Amer: >90 mL/min (ref >90)
Glucose, Bld: 103 mg/dL — ABNORMAL HIGH (ref 70–99)
Potassium: 3.2 mEq/L — ABNORMAL LOW (ref 3.5–5.1)
Sodium: 131 mEq/L — ABNORMAL LOW (ref 135–145)
Total Bilirubin: 0.5 mg/dL (ref 0.3–1.2)
Total Protein: 6.8 g/dL (ref 6.0–8.3)

## 2012-01-06 LAB — URINE MICROSCOPIC-ADD ON

## 2012-01-06 MED ORDER — ONDANSETRON HCL 4 MG/2ML IJ SOLN
4.0000 mg | Freq: Once | INTRAMUSCULAR | Status: AC
  Administered 2012-01-06: 4 mg via INTRAVENOUS
  Filled 2012-01-06: qty 2

## 2012-01-06 MED ORDER — SODIUM CHLORIDE 0.9 % IV BOLUS (SEPSIS)
1000.0000 mL | Freq: Once | INTRAVENOUS | Status: AC
  Administered 2012-01-06: 1000 mL via INTRAVENOUS

## 2012-01-06 MED ORDER — FENTANYL CITRATE 0.05 MG/ML IJ SOLN
50.0000 ug | Freq: Once | INTRAMUSCULAR | Status: AC
  Administered 2012-01-06: 50 ug via INTRAVENOUS
  Filled 2012-01-06: qty 2

## 2012-01-06 NOTE — ED Notes (Signed)
Pt presenting to ed with c/o diagnosed recently with diverticulitis and uti pt states discharged from hospital 9/9. Pt states positive fever at home and urine is orange. Pt states he feels weak and possibly dehydrated

## 2012-01-07 MED ORDER — CYCLOBENZAPRINE HCL 10 MG PO TABS: 10.0000 mg | ORAL_TABLET | Freq: Two times a day (BID) | ORAL | Status: DC | PRN

## 2012-01-07 MED ORDER — HYDROCODONE-ACETAMINOPHEN 5-500 MG PO TABS: 1.0000 | ORAL_TABLET | Freq: Four times a day (QID) | ORAL | Status: DC | PRN

## 2012-01-07 NOTE — ED Provider Notes (Signed)
History     CSN: [id number]  Arrival date & time 01/06/12  Paulo Fruit   First MD Initiated Contact with Patient 01/06/12 2007      Chief Complaint  Patient presents with  . Fever    (Consider location/radiation/quality/duration/timing/severity/associated sxs/prior treatment) HPI   pt presents to the ER for fevers at home up to 102. He was diagnosed with diverticulitis and UTI on 9/6 for which he was admitted for for 3 days. Since then he has been doing very well. He has an appointment with GI coming up on Oct 2. He does endorse some low back pain. He is a [employer] and admits that since he has been sick he has been sitting more often. He denies any abdominal pain or dysuria but endorses that his urine has been amber in color. He worries he may have a UTI. He denies any othe rassociate symptoms of N/V/D, chills. He thinks he may be dehydrated as he feels weaker than normal.   Past Medical History  Diagnosis Date  . Urinary anastomotic stricture undiagnosed     hard to be cathed; urology did the last time    History reviewed. No pertinent past surgical history.  No family history on file.  History  Substance Use Topics  . Smoking status: Current Every Day Smoker -- 2.0 packs/day for 20 years    Types: Cigarettes  . Smokeless tobacco: Not on file  . Alcohol Use: No      Review of Systems  Review of Systems  Gen: no weight loss, fevers, chills, night sweats  Eyes: no discharge or drainage, no occular pain or visual changes  Nose: no epistaxis or rhinorrhea  Mouth: no dental pain, no sore throat  Neck: no neck pain  Lungs:No wheezing, coughing or hemoptysis CV: no chest pain, palpitations, dependent edema or orthopnea  Abd: no abdominal pain, nausea, vomiting  GU: no dysuria or gross hematuria  MSK:  Low back pain Neuro: no headache, no focal neurologic deficits  Skin: no abnormalities Psyche: negative.   Allergies  Review of patient's allergies indicates no known  allergies.  Home Medications   Current Outpatient Rx  Name Route Sig Dispense Refill  . HYDROCODONE-ACETAMINOPHEN 5-325 MG PO TABS Oral Take 1 tablet by mouth every 6 (six) hours as needed. For pain.    Marland Kitchen NAPROXEN SODIUM 220 MG PO TABS Oral Take 440 mg by mouth 2 (two) times daily with a meal.    . CIPROFLOXACIN HCL 500 MG PO TABS Oral Take 500 mg by mouth 2 (two) times daily.    . CYCLOBENZAPRINE HCL 10 MG PO TABS Oral Take 1 tablet (10 mg total) by mouth 2 (two) times daily as needed for muscle spasms. 20 tablet 0  . HYDROCODONE-ACETAMINOPHEN 5-500 MG PO TABS Oral Take 1-2 tablets by mouth every 6 (six) hours as needed for pain. 15 tablet 0  . METRONIDAZOLE 500 MG PO TABS Oral Take 500 mg by mouth 3 (three) times daily.      BP 111/58  Pulse 94  Temp 99.4 F (37.4 C) (Oral)  Resp 24  SpO2 94%  Physical Exam  Nursing note and vitals reviewed. Constitutional: He appears well-developed and well-nourished. No distress.  HENT:  Head: Normocephalic and atraumatic.  Eyes: Pupils are equal, round, and reactive to light.  Neck: Normal range of motion. Neck supple.  Cardiovascular: Normal rate and regular rhythm.   Pulmonary/Chest: Effort normal.  Abdominal: Soft. He exhibits no distension and no mass. There  is no tenderness. There is no rebound and no guarding.  Musculoskeletal:        Equal strength to bilateral lower extremities. Neurosensory  function adequate to both legs. Skin color is normal. Skin is warm and moist. I see no step off deformity, no bony tenderness. Pt is able to ambulate without limp. Pain is relieved when sitting in certain positions. ROM is decreased due to pain. No crepitus, laceration, effusion, swelling.  Pulses are normal   Neurological: He is alert.  Skin: Skin is warm and dry.    ED Course  Procedures (including critical care time)  Labs Reviewed  CBC WITH DIFFERENTIAL - Abnormal; Notable for the following:    WBC 17.4 (*)     Platelets 403 (*)      Neutro Abs 13.3 (*)     Monocytes Absolute 1.4 (*)     All other components within normal limits  COMPREHENSIVE METABOLIC PANEL - Abnormal; Notable for the following:    Sodium 131 (*)     Potassium 3.2 (*)     Glucose, Bld 103 (*)     Albumin 3.3 (*)     All other components within normal limits  URINALYSIS, ROUTINE W REFLEX MICROSCOPIC - Abnormal; Notable for the following:    Color, Urine AMBER (*)  BIOCHEMICALS MAY BE AFFECTED BY COLOR   Hgb urine dipstick TRACE (*)     Bilirubin Urine SMALL (*)     Ketones, ur TRACE (*)     Leukocytes, UA SMALL (*)     All other components within normal limits  URINE MICROSCOPIC-ADD ON  URINE CULTURE   Dg Abd 1 View  01/06/2012  *RADIOLOGY REPORT*  Clinical Data: Abdominal pain, nausea, recent diverticulitis  ABDOMEN - 1 VIEW  Comparison: None.  Findings: Nonspecific bowel gas pattern, but without disproportionate small bowel dilatation to suggest small bowel obstruction.  Visualized osseous structures are within normal limits.  IMPRESSION: No evidence of bowel obstruction.   Original Report Authenticated By: Charline Bills, M.D.      1. Fever   2. Back pain       MDM   For suspected pyelonephritis on my initial examination the patient. He is tachycardic with a low-grade fever and low back pain. However the patient was dehydrated as when he was fluid resuscitated his tachycardia resolved. His urinalysis shows no gross urinary tract infection. Also while in the emergency department the patient's fever never reached 100F. He has not taken any Tylenol or any fever reducers today. The back pain he is having is very mild. The fever at home of what concerns him the most. He has followup with his GI doctor coming up in 2 weeks. The patient feels fine and is reliable to return to the emergency department if symptoms develop or decline. Given him strict return to the emergency department guidelines and he voices his understanding.  To note the  patient does have an elevated white count. He is not on any steroids. I do not find a cause for the elevated white count however I have advised him to have a GI Dr. recheck that white count in 2 weeks during his appointment.  Pt has been advised of the symptoms that warrant their return to the ED. Patient has voiced understanding and has agreed to follow-up with the PCP or specialist.       Dorthula Matas, PA 01/07/12 [phone]

## 2012-01-07 NOTE — ED Provider Notes (Signed)
Medical screening examination/treatment/procedure(s) were performed by non-physician practitioner and as supervising physician I was immediately available for consultation/collaboration.  Zarius Furr, MD 01/07/12 1526 

## 2012-01-08 LAB — URINE CULTURE: Colony Count: 30000

## 2012-01-16 ENCOUNTER — Encounter (HOSPITAL_COMMUNITY): Payer: Self-pay | Admitting: *Deleted

## 2012-01-16 ENCOUNTER — Emergency Department (HOSPITAL_COMMUNITY): Payer: Medicaid Other

## 2012-01-16 ENCOUNTER — Inpatient Hospital Stay (HOSPITAL_COMMUNITY)
Admission: EM | Admit: 2012-01-16 | Discharge: 2012-02-13 | DRG: 329 | Disposition: A | Discharge status: Home Health | Payer: Medicaid Other | Attending: General Surgery | Admitting: General Surgery

## 2012-01-16 DIAGNOSIS — E46 Unspecified protein-calorie malnutrition: Secondary | ICD-10-CM | POA: Diagnosis not present

## 2012-01-16 DIAGNOSIS — R112 Nausea with vomiting, unspecified: Secondary | ICD-10-CM

## 2012-01-16 DIAGNOSIS — J9601 Acute respiratory failure with hypoxia: Secondary | ICD-10-CM | POA: Diagnosis not present

## 2012-01-16 DIAGNOSIS — A419 Sepsis, unspecified organism: Secondary | ICD-10-CM | POA: Diagnosis not present

## 2012-01-16 DIAGNOSIS — J988 Other specified respiratory disorders: Secondary | ICD-10-CM | POA: Diagnosis not present

## 2012-01-16 DIAGNOSIS — K651 Peritoneal abscess: Secondary | ICD-10-CM | POA: Diagnosis present

## 2012-01-16 DIAGNOSIS — E662 Morbid (severe) obesity with alveolar hypoventilation: Secondary | ICD-10-CM | POA: Diagnosis present

## 2012-01-16 DIAGNOSIS — G9340 Encephalopathy, unspecified: Secondary | ICD-10-CM | POA: Diagnosis not present

## 2012-01-16 DIAGNOSIS — IMO0002 Reserved for concepts with insufficient information to code with codable children: Secondary | ICD-10-CM | POA: Diagnosis not present

## 2012-01-16 DIAGNOSIS — N390 Urinary tract infection, site not specified: Secondary | ICD-10-CM | POA: Diagnosis not present

## 2012-01-16 DIAGNOSIS — R109 Unspecified abdominal pain: Secondary | ICD-10-CM

## 2012-01-16 DIAGNOSIS — T50995A Adverse effect of other drugs, medicaments and biological substances, initial encounter: Secondary | ICD-10-CM | POA: Diagnosis not present

## 2012-01-16 DIAGNOSIS — K5792 Diverticulitis of intestine, part unspecified, without perforation or abscess without bleeding: Secondary | ICD-10-CM

## 2012-01-16 DIAGNOSIS — F17200 Nicotine dependence, unspecified, uncomplicated: Secondary | ICD-10-CM | POA: Diagnosis present

## 2012-01-16 DIAGNOSIS — K56609 Unspecified intestinal obstruction, unspecified as to partial versus complete obstruction: Secondary | ICD-10-CM | POA: Diagnosis present

## 2012-01-16 DIAGNOSIS — N179 Acute kidney failure, unspecified: Secondary | ICD-10-CM | POA: Diagnosis not present

## 2012-01-16 DIAGNOSIS — Y836 Removal of other organ (partial) (total) as the cause of abnormal reaction of the patient, or of later complication, without mention of misadventure at the time of the procedure: Secondary | ICD-10-CM | POA: Diagnosis not present

## 2012-01-16 DIAGNOSIS — N9989 Other postprocedural complications and disorders of genitourinary system: Secondary | ICD-10-CM | POA: Diagnosis not present

## 2012-01-16 DIAGNOSIS — R6521 Severe sepsis with septic shock: Secondary | ICD-10-CM | POA: Diagnosis not present

## 2012-01-16 DIAGNOSIS — Z6838 Body mass index (BMI) 38.0-38.9, adult: Secondary | ICD-10-CM

## 2012-01-16 DIAGNOSIS — K9403 Colostomy malfunction: Secondary | ICD-10-CM | POA: Diagnosis not present

## 2012-01-16 DIAGNOSIS — J9819 Other pulmonary collapse: Secondary | ICD-10-CM | POA: Diagnosis not present

## 2012-01-16 DIAGNOSIS — E876 Hypokalemia: Secondary | ICD-10-CM | POA: Diagnosis not present

## 2012-01-16 DIAGNOSIS — G92 Toxic encephalopathy: Secondary | ICD-10-CM | POA: Diagnosis not present

## 2012-01-16 DIAGNOSIS — T8131XA Disruption of external operation (surgical) wound, not elsewhere classified, initial encounter: Secondary | ICD-10-CM | POA: Diagnosis not present

## 2012-01-16 DIAGNOSIS — R5381 Other malaise: Secondary | ICD-10-CM | POA: Diagnosis not present

## 2012-01-16 DIAGNOSIS — J95821 Acute postprocedural respiratory failure: Secondary | ICD-10-CM | POA: Diagnosis not present

## 2012-01-16 DIAGNOSIS — Y832 Surgical operation with anastomosis, bypass or graft as the cause of abnormal reaction of the patient, or of later complication, without mention of misadventure at the time of the procedure: Secondary | ICD-10-CM | POA: Diagnosis not present

## 2012-01-16 DIAGNOSIS — K5732 Diverticulitis of large intestine without perforation or abscess without bleeding: Secondary | ICD-10-CM | POA: Diagnosis present

## 2012-01-16 DIAGNOSIS — Z23 Encounter for immunization: Secondary | ICD-10-CM

## 2012-01-16 DIAGNOSIS — K9413 Enterostomy malfunction: Secondary | ICD-10-CM | POA: Diagnosis not present

## 2012-01-16 DIAGNOSIS — K650 Generalized (acute) peritonitis: Secondary | ICD-10-CM | POA: Diagnosis present

## 2012-01-16 DIAGNOSIS — G929 Unspecified toxic encephalopathy: Secondary | ICD-10-CM | POA: Diagnosis not present

## 2012-01-16 DIAGNOSIS — E870 Hyperosmolality and hypernatremia: Secondary | ICD-10-CM | POA: Diagnosis not present

## 2012-01-16 DIAGNOSIS — G4733 Obstructive sleep apnea (adult) (pediatric): Secondary | ICD-10-CM | POA: Diagnosis present

## 2012-01-16 DIAGNOSIS — K56699 Other intestinal obstruction unspecified as to partial versus complete obstruction: Secondary | ICD-10-CM | POA: Diagnosis present

## 2012-01-16 DIAGNOSIS — E8779 Other fluid overload: Secondary | ICD-10-CM | POA: Diagnosis not present

## 2012-01-16 DIAGNOSIS — K5669 Other intestinal obstruction: Principal | ICD-10-CM | POA: Diagnosis present

## 2012-01-16 DIAGNOSIS — A409 Streptococcal sepsis, unspecified: Secondary | ICD-10-CM | POA: Diagnosis not present

## 2012-01-16 DIAGNOSIS — N170 Acute kidney failure with tubular necrosis: Secondary | ICD-10-CM | POA: Diagnosis not present

## 2012-01-16 DIAGNOSIS — D473 Essential (hemorrhagic) thrombocythemia: Secondary | ICD-10-CM | POA: Diagnosis not present

## 2012-01-16 DIAGNOSIS — K630 Abscess of intestine: Secondary | ICD-10-CM | POA: Diagnosis not present

## 2012-01-16 DIAGNOSIS — R7309 Other abnormal glucose: Secondary | ICD-10-CM | POA: Diagnosis not present

## 2012-01-16 DIAGNOSIS — Z720 Tobacco use: Secondary | ICD-10-CM

## 2012-01-16 DIAGNOSIS — E781 Pure hyperglyceridemia: Secondary | ICD-10-CM | POA: Diagnosis present

## 2012-01-16 DIAGNOSIS — K5900 Constipation, unspecified: Secondary | ICD-10-CM

## 2012-01-16 DIAGNOSIS — J810 Acute pulmonary edema: Secondary | ICD-10-CM

## 2012-01-16 DIAGNOSIS — G8918 Other acute postprocedural pain: Secondary | ICD-10-CM | POA: Diagnosis not present

## 2012-01-16 DIAGNOSIS — R6520 Severe sepsis without septic shock: Secondary | ICD-10-CM | POA: Diagnosis not present

## 2012-01-16 HISTORY — DX: Morbid (severe) obesity due to excess calories: E66.01

## 2012-01-16 HISTORY — DX: Diverticulitis of intestine, part unspecified, without perforation or abscess without bleeding: K57.92

## 2012-01-16 HISTORY — DX: Unspecified intestinal obstruction, unspecified as to partial versus complete obstruction: K56.609

## 2012-01-16 LAB — COMPREHENSIVE METABOLIC PANEL
ALT: 25 U/L (ref 0–53)
AST: 15 U/L (ref 0–37)
Albumin: 3.3 g/dL — ABNORMAL LOW (ref 3.5–5.2)
Alkaline Phosphatase: 120 U/L — ABNORMAL HIGH (ref 39–117)
BUN: 9 mg/dL (ref 6–23)
CO2: 22 mEq/L (ref 19–32)
Calcium: 9.7 mg/dL (ref 8.4–10.5)
Chloride: 98 mEq/L (ref 96–112)
Creatinine, Ser: 0.7 mg/dL (ref 0.50–1.35)
GFR calc Af Amer: >90 mL/min (ref >90)
GFR calc non Af Amer: >90 mL/min (ref >90)
Glucose, Bld: 130 mg/dL — ABNORMAL HIGH (ref 70–99)
Potassium: 4.5 mEq/L (ref 3.5–5.1)
Sodium: 133 mEq/L — ABNORMAL LOW (ref 135–145)
Total Bilirubin: 0.5 mg/dL (ref 0.3–1.2)
Total Protein: 7.7 g/dL (ref 6.0–8.3)

## 2012-01-16 LAB — CBC WITH DIFFERENTIAL/PLATELET
Basophils Absolute: 0 10*3/uL (ref 0.0–0.1)
Basophils Relative: 0 % (ref 0–1)
Eosinophils Absolute: 0 10*3/uL (ref 0.0–0.7)
Eosinophils Relative: 0 % (ref 0–5)
HCT: 47.3 % (ref 39.0–52.0)
Hemoglobin: 15.9 g/dL (ref 13.0–17.0)
Lymphocytes Relative: 9 % — ABNORMAL LOW (ref 12–46)
Lymphs Abs: 1.3 10*3/uL (ref 0.7–4.0)
MCH: 30.9 pg (ref 26.0–34.0)
MCHC: 33.6 g/dL (ref 30.0–36.0)
MCV: 91.8 fL (ref 78.0–100.0)
Monocytes Absolute: 1.3 10*3/uL — ABNORMAL HIGH (ref 0.1–1.0)
Monocytes Relative: 9 % (ref 3–12)
Neutro Abs: 11.8 10*3/uL — ABNORMAL HIGH (ref 1.7–7.7)
Neutrophils Relative %: 82 % — ABNORMAL HIGH (ref 43–77)
Platelets: 569 10*3/uL — ABNORMAL HIGH (ref 150–400)
RBC: 5.15 MIL/uL (ref 4.22–5.81)
RDW: 12.6 % (ref 11.5–15.5)
WBC: 14.4 10*3/uL — ABNORMAL HIGH (ref 4.0–10.5)

## 2012-01-16 LAB — URINALYSIS, ROUTINE W REFLEX MICROSCOPIC
Glucose, UA: NEGATIVE mg/dL
Ketones, ur: 40 mg/dL — AB
Nitrite: NEGATIVE
Protein, ur: 30 mg/dL — AB
Specific Gravity, Urine: 1.024 (ref 1.005–1.030)
Urobilinogen, UA: 1 mg/dL (ref 0.0–1.0)
pH: 5.5 (ref 5.0–8.0)

## 2012-01-16 LAB — URINE MICROSCOPIC-ADD ON

## 2012-01-16 LAB — LIPASE, BLOOD: Lipase: 24 U/L (ref 11–59)

## 2012-01-16 MED ORDER — ONDANSETRON HCL 4 MG/2ML IJ SOLN
4.0000 mg | Freq: Once | INTRAMUSCULAR | Status: AC
  Administered 2012-01-16: 4 mg via INTRAVENOUS
  Filled 2012-01-16: qty 2

## 2012-01-16 MED ORDER — ONDANSETRON HCL 4 MG/2ML IJ SOLN
4.0000 mg | Freq: Four times a day (QID) | INTRAMUSCULAR | Status: DC | PRN
  Administered 2012-01-16 – 2012-01-18 (×6): 4 mg via INTRAVENOUS
  Filled 2012-01-16 (×7): qty 2

## 2012-01-16 MED ORDER — PANTOPRAZOLE SODIUM 40 MG IV SOLR
40.0000 mg | Freq: Every day | INTRAVENOUS | Status: DC
  Administered 2012-01-16 – 2012-02-04 (×20): 40 mg via INTRAVENOUS
  Filled 2012-01-16 (×21): qty 40

## 2012-01-16 MED ORDER — INFLUENZA VIRUS VACC SPLIT PF IM SUSP
0.5000 mL | INTRAMUSCULAR | Status: AC
  Filled 2012-01-16: qty 0.5

## 2012-01-16 MED ORDER — ENOXAPARIN SODIUM 40 MG/0.4ML ~~LOC~~ SOLN
40.0000 mg | SUBCUTANEOUS | Status: DC
  Administered 2012-01-16 – 2012-01-24 (×9): 40 mg via SUBCUTANEOUS
  Filled 2012-01-16 (×11): qty 0.4

## 2012-01-16 MED ORDER — SODIUM CHLORIDE 0.9 % IV BOLUS (SEPSIS)
1000.0000 mL | Freq: Once | INTRAVENOUS | Status: AC
  Administered 2012-01-16: 1000 mL via INTRAVENOUS

## 2012-01-16 MED ORDER — PIPERACILLIN-TAZOBACTAM 3.375 G IVPB
3.3750 g | Freq: Three times a day (TID) | INTRAVENOUS | Status: AC
  Administered 2012-01-16 – 2012-01-27 (×32): 3.375 g via INTRAVENOUS
  Filled 2012-01-16 (×35): qty 50

## 2012-01-16 MED ORDER — KCL IN DEXTROSE-NACL 20-5-0.9 MEQ/L-%-% IV SOLN
INTRAVENOUS | Status: DC
  Administered 2012-01-17: 05:00:00 via INTRAVENOUS
  Administered 2012-01-18: 1000 mL via INTRAVENOUS
  Administered 2012-01-18: 125 mL/h via INTRAVENOUS
  Administered 2012-01-22: 20 mL/h via INTRAVENOUS
  Administered 2012-01-22: 01:00:00 via INTRAVENOUS
  Filled 2012-01-16 (×12): qty 1000

## 2012-01-16 MED ORDER — SODIUM CHLORIDE 0.9 % IV SOLN: Freq: Once | INTRAVENOUS | Status: DC

## 2012-01-16 MED ORDER — HYDROMORPHONE HCL PF 1 MG/ML IJ SOLN
1.0000 mg | Freq: Once | INTRAMUSCULAR | Status: AC
  Administered 2012-01-16: 1 mg via INTRAVENOUS
  Filled 2012-01-16: qty 1

## 2012-01-16 MED ORDER — IOHEXOL 300 MG/ML  SOLN
100.0000 mL | Freq: Once | INTRAMUSCULAR | Status: AC | PRN
  Administered 2012-01-16: 100 mL via INTRAVENOUS

## 2012-01-16 MED ORDER — HYDROMORPHONE HCL PF 1 MG/ML IJ SOLN
1.0000 mg | INTRAMUSCULAR | Status: DC | PRN
  Administered 2012-01-16 – 2012-01-17 (×5): 1 mg via INTRAVENOUS
  Filled 2012-01-16 (×6): qty 1

## 2012-01-16 NOTE — ED Provider Notes (Addendum)
History    41 year old male with abdominal pain and nausea and vomiting. Patient has been having ongoing symptoms for the past month. He was evaluated and admitted approximately one month ago and diagnosed with diverticulitis. He states that he is feeling better after discharge but not completely back to normal. Seen about a week and a half ago and diagnosed with pyelonephritis. Symptoms have persisted and progressively worse in the past 2 days. Pain is diffuse and crampy. No appreciable exacerbating or relieving factors. Associated with nausea and anorexia. No vomiting, but burping a lot. No urinary complaints. No diarrhea. No fevers or chills. Scheduled for [facility] GI appointment on October 2nd.  CSN: [id number]  Arrival date & time 01/16/12  1055   First MD Initiated Contact with Patient 01/16/12 1134      No chief complaint on file.   (Consider location/radiation/quality/duration/timing/severity/associated sxs/prior treatment) HPI  Past Medical History  Diagnosis Date  . Urinary anastomotic stricture undiagnosed     hard to be cathed; urology did the last time    History reviewed. No pertinent past surgical history.  History reviewed. No pertinent family history.  History  Substance Use Topics  . Smoking status: Current Every Day Smoker -- 2.0 packs/day for 20 years    Types: Cigarettes  . Smokeless tobacco: Never Used  . Alcohol Use: No      Review of Systems   Review of symptoms negative unless otherwise noted in HPI.   Allergies  Review of patient's allergies indicates no known allergies.  Home Medications   Current Outpatient Rx  Name Route Sig Dispense Refill  . CYCLOBENZAPRINE HCL 10 MG PO TABS Oral Take 1 tablet (10 mg total) by mouth 2 (two) times daily as needed for muscle spasms. 20 tablet 0  . HYDROCODONE-ACETAMINOPHEN 5-325 MG PO TABS Oral Take 1 tablet by mouth every 6 (six) hours as needed. For pain.    Marland Kitchen HYDROCODONE-ACETAMINOPHEN 5-500 MG PO TABS  Oral Take 1-2 tablets by mouth every 6 (six) hours as needed for pain. 15 tablet 0  . NAPROXEN SODIUM 220 MG PO TABS Oral Take 440 mg by mouth 2 (two) times daily with a meal.      BP 156/97  Pulse 114  Temp 97.5 F (36.4 C) (Oral)  Resp 20  SpO2 96%  Physical Exam  Nursing note and vitals reviewed. Constitutional: He appears well-developed and well-nourished.       Laying in bed. Uncomfortable appearing but not toxic.  HENT:  Head: Normocephalic and atraumatic.  Eyes: Conjunctivae normal are normal. Right eye exhibits no discharge. Left eye exhibits no discharge.  Neck: Neck supple.  Cardiovascular: Normal rate, regular rhythm and normal heart sounds.  Exam reveals no gallop and no friction rub.   No murmur heard. Pulmonary/Chest: Effort normal and breath sounds normal. No respiratory distress.  Abdominal: Soft. He exhibits no distension. There is no tenderness.       Seems distended but exam difficult due to body habitus. Diffuse tenderness without rebound or guarding. No mass palpated.  Genitourinary:       No costovertebral angle tenderness  Musculoskeletal: He exhibits no edema and no tenderness.  Neurological: He is alert.  Skin: Skin is warm and dry.  Psychiatric: He has a normal mood and affect. His behavior is normal. Thought content normal.    ED Course  Procedures (including critical care time)  Labs Reviewed  CBC WITH DIFFERENTIAL - Abnormal; Notable for the following:    WBC 14.4 (*)  Platelets 569 (*)     Neutrophils Relative 82 (*)     Neutro Abs 11.8 (*)     Lymphocytes Relative 9 (*)     Monocytes Absolute 1.3 (*)     All other components within normal limits  URINALYSIS, ROUTINE W REFLEX MICROSCOPIC - Abnormal; Notable for the following:    Color, Urine AMBER (*)  BIOCHEMICALS MAY BE AFFECTED BY COLOR   APPearance CLOUDY (*)     Hgb urine dipstick SMALL (*)     Bilirubin Urine MODERATE (*)     Ketones, ur 40 (*)     Protein, ur 30 (*)      Leukocytes, UA TRACE (*)     All other components within normal limits  URINE MICROSCOPIC-ADD ON - Abnormal; Notable for the following:    Squamous Epithelial / LPF FEW (*)     Bacteria, UA FEW (*)     All other components within normal limits  COMPREHENSIVE METABOLIC PANEL  LIPASE, BLOOD   Ct Abdomen Pelvis W Contrast  01/16/2012  *RADIOLOGY REPORT*  Clinical Data: 41 year old male with continued and increasing abdominal and pelvic pain.  CT ABDOMEN AND PELVIS WITH CONTRAST  Technique:  Multidetector CT imaging of the abdomen and pelvis was performed following the standard protocol during bolus administration of intravenous contrast.  Contrast: OMNIPAQUE IOHEXOL 300 MG/ML  SOLN  Comparison: 12/23/2011 and 08/25/2009 CTs  Findings: Dilated small bowel and colon is identified extending to circumferential narrowing/wall thickening of the mid descending colon (6 cm in length) with adjacent stranding and mildly enlarged pericolonic lymph nodes.  There is a 3 cm area of low density extension from the colonic wall thickening through the peritoneum/lateral pericolic gutter fascia which abuts the transversus abdominus muscle.  A tiny amount of free fluid within the right pericolic gutter is noted.  There is no evidence of pneumoperitoneum, enlarged lymph nodes, biliary dilation or abdominal aortic aneurysm identified.  The liver, spleen, adrenal glands, pancreas, kidneys and gallbladder are unremarkable.  The bladder and appendix are unremarkable.  No acute or suspicious bony abnormalities are identified. Several chronic disc protrusion/bulges within the thoracic and lumbar spine are noted.  IMPRESSION: High-grade colonic and small bowel obstruction from mid-descending colonic wall thickening with adjacent stranding, mildly enlarged pericolonic lymph nodes and low density extension through the peritoneum/pericolic gutter fascia.  Although this could represent an inflammatory or infectious process with  adjacent phlegmon, these findings are suspicious for neoplasm and further evaluation / direct inspection is recommended. No evidence of pneumoperitoneum.  These results were called to Dr. Juleen China on 01/16/2012 at 4:17 p.m.   Original Report Authenticated By: Rosendo Gros, M.D.      1. Bowel obstruction   2. Abdominal pain       MDM  41 year old male with abdominal pain and nausea. CT significant for a high-grade obstruction with question of underlying neoplasm. NPO. NGT. IVF. Will discuss with surgery. Admit.         Raeford Razor, MD 01/16/12 1653  5:15 PM Discussed with Dr Daphine Deutscher, CCS.  Raeford Razor, MD 01/16/12 [phone]

## 2012-01-16 NOTE — ED Notes (Signed)
Pt drinking CT contrast 

## 2012-01-16 NOTE — Progress Notes (Signed)
Pt listed as self pay with no insurance coverage Pt confirms she is self pay [place] resident.  CM discussed and provided written information for self pay pcps, importance of pcp for f/u care, [email or web address] and other [place] resources Pt voiced understanding and appreciative of resources

## 2012-01-16 NOTE — ED Notes (Signed)
Pt states has not had a BM x 5 days, pt states has been having this problem over past 5 weeks and this is the 3rd time coming to the hospital for it. Pt states having abdominal pain, states "I feel like my stomach is about to bust". Pt states having nausea and dry hieves. Pt also states he's been diaphoretic.

## 2012-01-16 NOTE — H&P (Signed)
[name] is an 41 y.o. male.   Chief Complaint: abdominal pain HPI: 41 y/o male 1 month history of abdominal pain,  Nausea and vomiting.  Admitted on 12/23/2011 for presumed diverticulitis.  Has had a change in BM frequency to less frequent.  No BM since wed.  Pain is duffuse and crampy.  CT shows stricture of descending colon with colonic obstruction.  No free air or perforation.  Past Medical History  Diagnosis Date  . Urinary anastomotic stricture undiagnosed     hard to be cathed; urology did the last time  . Diverticulitis     Past Surgical History  Procedure Date  . No past surgeries     History reviewed. No pertinent family history. Social History:  reports that he has been smoking Cigarettes.  He has a 40 pack-year smoking history. He has never used smokeless tobacco. He reports that he does not drink alcohol or use illicit drugs.  Allergies: No Known Allergies   (Not in a hospital admission)  Results for orders placed during the hospital encounter of 01/16/12 (from the past 48 hour(s))  URINALYSIS, ROUTINE W REFLEX MICROSCOPIC     Status: Abnormal   Collection Time   01/16/12 11:15 AM      Component Value Range Comment   Color, Urine AMBER (*) YELLOW BIOCHEMICALS MAY BE AFFECTED BY COLOR   APPearance CLOUDY (*) CLEAR    Specific Gravity, Urine 1.024  1.005 - 1.030    pH 5.5  5.0 - 8.0    Glucose, UA NEGATIVE  NEGATIVE mg/dL    Hgb urine dipstick SMALL (*) NEGATIVE    Bilirubin Urine MODERATE (*) NEGATIVE    Ketones, ur 40 (*) NEGATIVE mg/dL    Protein, ur 30 (*) NEGATIVE mg/dL    Urobilinogen, UA 1.0  0.0 - 1.0 mg/dL    Nitrite NEGATIVE  NEGATIVE    Leukocytes, UA TRACE (*) NEGATIVE   URINE MICROSCOPIC-ADD ON     Status: Abnormal   Collection Time   01/16/12 11:15 AM      Component Value Range Comment   Squamous Epithelial / LPF FEW (*) RARE    WBC, UA 7-10  <3 WBC/hpf    RBC / HPF 3-6  <3 RBC/hpf    Bacteria, UA FEW (*) RARE    Urine-Other MUCOUS  PRESENT     CBC WITH DIFFERENTIAL     Status: Abnormal   Collection Time   01/16/12 12:10 PM      Component Value Range Comment   WBC 14.4 (*) 4.0 - 10.5 K/uL    RBC 5.15  4.22 - 5.81 MIL/uL    Hemoglobin 15.9  13.0 - 17.0 g/dL    HCT 78.2  95.6 - 21.3 %    MCV 91.8  78.0 - 100.0 fL    MCH 30.9  26.0 - 34.0 pg    MCHC 33.6  30.0 - 36.0 g/dL    RDW 08.6  57.8 - 46.9 %    Platelets 569 (*) 150 - 400 K/uL    Neutrophils Relative 82 (*) 43 - 77 %    Neutro Abs 11.8 (*) 1.7 - 7.7 K/uL    Lymphocytes Relative 9 (*) 12 - 46 %    Lymphs Abs 1.3  0.7 - 4.0 K/uL    Monocytes Relative 9  3 - 12 %    Monocytes Absolute 1.3 (*) 0.1 - 1.0 K/uL    Eosinophils Relative 0  0 - 5 %  Eosinophils Absolute 0.0  0.0 - 0.7 K/uL    Basophils Relative 0  0 - 1 %    Basophils Absolute 0.0  0.0 - 0.1 K/uL   COMPREHENSIVE METABOLIC PANEL     Status: Abnormal   Collection Time   01/16/12 12:10 PM      Component Value Range Comment   Sodium 133 (*) 135 - 145 mEq/L    Potassium 4.5  3.5 - 5.1 mEq/L    Chloride 98  96 - 112 mEq/L    CO2 22  19 - 32 mEq/L    Glucose, Bld 130 (*) 70 - 99 mg/dL    BUN 9  6 - 23 mg/dL    Creatinine, Ser 4.09  0.50 - 1.35 mg/dL    Calcium 9.7  8.4 - 81.1 mg/dL    Total Protein 7.7  6.0 - 8.3 g/dL    Albumin 3.3 (*) 3.5 - 5.2 g/dL    AST 15  0 - 37 U/L    ALT 25  0 - 53 U/L    Alkaline Phosphatase 120 (*) 39 - 117 U/L    Total Bilirubin 0.5  0.3 - 1.2 mg/dL    GFR calc non Af Amer >90  >90 mL/min    GFR calc Af Amer >90  >90 mL/min   LIPASE, BLOOD     Status: Normal   Collection Time   01/16/12 12:10 PM      Component Value Range Comment   Lipase 24  11 - 59 U/L    Ct Abdomen Pelvis W Contrast  01/16/2012  *RADIOLOGY REPORT*  Clinical Data: 41 year old male with continued and increasing abdominal and pelvic pain.  CT ABDOMEN AND PELVIS WITH CONTRAST  Technique:  Multidetector CT imaging of the abdomen and pelvis was performed following the standard protocol during bolus  administration of intravenous contrast.  Contrast: OMNIPAQUE IOHEXOL 300 MG/ML  SOLN  Comparison: 12/23/2011 and 08/25/2009 CTs  Findings: Dilated small bowel and colon is identified extending to circumferential narrowing/wall thickening of the mid descending colon (6 cm in length) with adjacent stranding and mildly enlarged pericolonic lymph nodes.  There is a 3 cm area of low density extension from the colonic wall thickening through the peritoneum/lateral pericolic gutter fascia which abuts the transversus abdominus muscle.  A tiny amount of free fluid within the right pericolic gutter is noted.  There is no evidence of pneumoperitoneum, enlarged lymph nodes, biliary dilation or abdominal aortic aneurysm identified.  The liver, spleen, adrenal glands, pancreas, kidneys and gallbladder are unremarkable.  The bladder and appendix are unremarkable.  No acute or suspicious bony abnormalities are identified. Several chronic disc protrusion/bulges within the thoracic and lumbar spine are noted.  IMPRESSION: High-grade colonic and small bowel obstruction from mid-descending colonic wall thickening with adjacent stranding, mildly enlarged pericolonic lymph nodes and low density extension through the peritoneum/pericolic gutter fascia.  Although this could represent an inflammatory or infectious process with adjacent phlegmon, these findings are suspicious for neoplasm and further evaluation / direct inspection is recommended. No evidence of pneumoperitoneum.  These results were called to Dr. Juleen China on 01/16/2012 at 4:17 p.m.   Original Report Authenticated By: Rosendo Gros, M.D.     Review of Systems  Constitutional: Negative for fever and chills.  HENT: Negative.   Eyes: Negative.   Respiratory: Negative.   Cardiovascular: Negative.   Gastrointestinal: Positive for nausea, vomiting, abdominal pain and constipation.  Genitourinary: Positive for dysuria.  Musculoskeletal: Negative.  Skin: Negative.     Neurological: Negative.   Endo/Heme/Allergies: Negative.   Psychiatric/Behavioral: Negative.     Blood pressure 139/80, pulse 102, temperature 98.7 F (37.1 C), temperature source Oral, resp. rate 18, SpO2 90.00%. Physical Exam  Constitutional: He is oriented to person, place, and time. He appears well-developed and well-nourished.  HENT:  Head: Normocephalic and atraumatic.  Eyes: EOM are normal. Pupils are equal, round, and reactive to light.  Neck: Normal range of motion. Neck supple.  Cardiovascular: Normal rate and regular rhythm.   Respiratory: Effort normal and breath sounds normal.  GI: He exhibits distension. He exhibits no mass. There is tenderness. There is no rebound and no guarding.  Musculoskeletal: Normal range of motion.  Neurological: He is alert and oriented to person, place, and time.  Skin: Skin is warm and dry.  Psychiatric: He has a normal mood and affect. His behavior is normal. Judgment and thought content normal.     Assessment/Plan Descending colon obstruction   Cancer vs diverticulitis Admit IVF/ABX Will need partial colectomy and colostomy since prep an issue.  Discussed with patient and wife.    Ginnette Gates A. 01/16/2012, 5:14 PM

## 2012-01-17 ENCOUNTER — Inpatient Hospital Stay (HOSPITAL_COMMUNITY): Payer: Medicaid Other | Admitting: Anesthesiology

## 2012-01-17 ENCOUNTER — Encounter (HOSPITAL_COMMUNITY): Admission: EM | Disposition: A | Discharge status: Home Health | Payer: Self-pay | Source: Home / Self Care

## 2012-01-17 ENCOUNTER — Encounter (HOSPITAL_COMMUNITY): Payer: Self-pay | Admitting: General Surgery

## 2012-01-17 ENCOUNTER — Encounter (HOSPITAL_COMMUNITY): Payer: Self-pay | Admitting: Anesthesiology

## 2012-01-17 DIAGNOSIS — E66813 Obesity, class 3: Secondary | ICD-10-CM

## 2012-01-17 DIAGNOSIS — K630 Abscess of intestine: Secondary | ICD-10-CM

## 2012-01-17 DIAGNOSIS — K631 Perforation of intestine (nontraumatic): Secondary | ICD-10-CM

## 2012-01-17 DIAGNOSIS — K56609 Unspecified intestinal obstruction, unspecified as to partial versus complete obstruction: Secondary | ICD-10-CM | POA: Diagnosis present

## 2012-01-17 HISTORY — DX: Morbid (severe) obesity due to excess calories: E66.01

## 2012-01-17 HISTORY — DX: Unspecified intestinal obstruction, unspecified as to partial versus complete obstruction: K56.609

## 2012-01-17 HISTORY — PX: PARTIAL COLECTOMY: SHX5273

## 2012-01-17 HISTORY — PX: COLOSTOMY: SHX63

## 2012-01-17 HISTORY — DX: Obesity, class 3: E66.813

## 2012-01-17 LAB — CBC
HCT: 45.7 % (ref 39.0–52.0)
Hemoglobin: 15.5 g/dL (ref 13.0–17.0)
MCV: 93.1 fL (ref 78.0–100.0)
RDW: 12.9 % (ref 11.5–15.5)
WBC: 7.4 10*3/uL (ref 4.0–10.5)

## 2012-01-17 LAB — COMPREHENSIVE METABOLIC PANEL
Albumin: 2.8 g/dL — ABNORMAL LOW (ref 3.5–5.2)
Alkaline Phosphatase: 102 U/L (ref 39–117)
BUN: 12 mg/dL (ref 6–23)
Calcium: 8.8 mg/dL (ref 8.4–10.5)
Potassium: 4.1 mEq/L (ref 3.5–5.1)
Sodium: 136 mEq/L (ref 135–145)
Total Protein: 6.8 g/dL (ref 6.0–8.3)

## 2012-01-17 SURGERY — COLECTOMY, PARTIAL
Anesthesia: General | Site: Abdomen | Wound class: Clean Contaminated

## 2012-01-17 MED ORDER — NEOSTIGMINE METHYLSULFATE 1 MG/ML IJ SOLN
INTRAMUSCULAR | Status: DC | PRN
  Administered 2012-01-17: 4 mg via INTRAVENOUS

## 2012-01-17 MED ORDER — PROPOFOL 10 MG/ML IV BOLUS
INTRAVENOUS | Status: DC | PRN
  Administered 2012-01-17: 200 mg via INTRAVENOUS

## 2012-01-17 MED ORDER — LACTATED RINGERS IV BOLUS (SEPSIS): 500.0000 mL | Freq: Once | INTRAVENOUS | Status: DC

## 2012-01-17 MED ORDER — SODIUM CHLORIDE 0.9 % IJ SOLN: 9.0000 mL | INTRAMUSCULAR | Status: DC | PRN

## 2012-01-17 MED ORDER — ONDANSETRON HCL 4 MG/2ML IJ SOLN: 4.0000 mg | Freq: Four times a day (QID) | INTRAMUSCULAR | Status: DC | PRN

## 2012-01-17 MED ORDER — LACTATED RINGERS IV SOLN: INTRAVENOUS | Status: DC

## 2012-01-17 MED ORDER — ONDANSETRON HCL 4 MG/2ML IJ SOLN
INTRAMUSCULAR | Status: DC | PRN
  Administered 2012-01-17: 4 mg via INTRAVENOUS

## 2012-01-17 MED ORDER — NALOXONE HCL 0.4 MG/ML IJ SOLN: 0.4000 mg | INTRAMUSCULAR | Status: DC | PRN

## 2012-01-17 MED ORDER — ROCURONIUM BROMIDE 100 MG/10ML IV SOLN
INTRAVENOUS | Status: DC | PRN
  Administered 2012-01-17 (×2): 10 mg via INTRAVENOUS
  Administered 2012-01-17: 5 mg via INTRAVENOUS
  Administered 2012-01-17: 40 mg via INTRAVENOUS
  Administered 2012-01-17 (×2): 10 mg via INTRAVENOUS

## 2012-01-17 MED ORDER — PHENOL 1.4 % MT LIQD
1.0000 | OROMUCOSAL | Status: DC | PRN
  Administered 2012-01-17: 1 via OROMUCOSAL
  Filled 2012-01-17: qty 177

## 2012-01-17 MED ORDER — MIDAZOLAM HCL 5 MG/ML IJ SOLN
1.0000 mg | INTRAMUSCULAR | Status: DC | PRN
  Administered 2012-01-17 (×2): 1 mg via INTRAVENOUS

## 2012-01-17 MED ORDER — DEXAMETHASONE SODIUM PHOSPHATE 10 MG/ML IJ SOLN
INTRAMUSCULAR | Status: DC | PRN
  Administered 2012-01-17: 10 mg via INTRAVENOUS

## 2012-01-17 MED ORDER — MIDAZOLAM HCL 5 MG/5ML IJ SOLN
INTRAMUSCULAR | Status: DC | PRN
  Administered 2012-01-17: 1 mg
  Administered 2012-01-17: 2 mg via INTRAVENOUS

## 2012-01-17 MED ORDER — FENTANYL CITRATE 0.05 MG/ML IJ SOLN
INTRAMUSCULAR | Status: DC | PRN
  Administered 2012-01-17 (×4): 50 ug via INTRAVENOUS
  Administered 2012-01-17: 100 ug via INTRAVENOUS
  Administered 2012-01-17 (×6): 50 ug via INTRAVENOUS

## 2012-01-17 MED ORDER — MIDAZOLAM HCL 2 MG/2ML IJ SOLN: 2.0000 mg | INTRAMUSCULAR | Status: DC | PRN

## 2012-01-17 MED ORDER — HYDROMORPHONE HCL PF 1 MG/ML IJ SOLN
0.2500 mg | INTRAMUSCULAR | Status: DC | PRN
  Administered 2012-01-17 (×5): 0.5 mg via INTRAVENOUS

## 2012-01-17 MED ORDER — SUCCINYLCHOLINE CHLORIDE 20 MG/ML IJ SOLN
INTRAMUSCULAR | Status: DC | PRN
  Administered 2012-01-17: 100 mg via INTRAVENOUS

## 2012-01-17 MED ORDER — DIPHENHYDRAMINE HCL 50 MG/ML IJ SOLN: 12.5000 mg | Freq: Four times a day (QID) | INTRAMUSCULAR | Status: DC | PRN

## 2012-01-17 MED ORDER — HYDROMORPHONE 0.3 MG/ML IV SOLN
INTRAVENOUS | Status: DC
  Administered 2012-01-17: 0.3 mg via INTRAVENOUS
  Administered 2012-01-18: 2.1 mg via INTRAVENOUS
  Administered 2012-01-18: 2.34 mg via INTRAVENOUS
  Administered 2012-01-18: 08:00:00 via INTRAVENOUS
  Administered 2012-01-18: 2.4 mg via INTRAVENOUS
  Administered 2012-01-18: 4.5 mg via INTRAVENOUS
  Administered 2012-01-18: 0.6 mg via INTRAVENOUS
  Administered 2012-01-18: 18:00:00 via INTRAVENOUS
  Administered 2012-01-18: 2.4 mg via INTRAVENOUS
  Filled 2012-01-17 (×2): qty 25

## 2012-01-17 MED ORDER — GLYCOPYRROLATE 0.2 MG/ML IJ SOLN
INTRAMUSCULAR | Status: DC | PRN
  Administered 2012-01-17: .5 mg via INTRAVENOUS

## 2012-01-17 MED ORDER — METOPROLOL TARTRATE 1 MG/ML IV SOLN
5.0000 mg | INTRAVENOUS | Status: DC | PRN
  Administered 2012-01-17 (×4): 5 mg via INTRAVENOUS

## 2012-01-17 MED ORDER — HYDROMORPHONE HCL PF 1 MG/ML IJ SOLN: 0.5000 mg | INTRAMUSCULAR | Status: DC | PRN

## 2012-01-17 MED ORDER — DIPHENHYDRAMINE HCL 12.5 MG/5ML PO ELIX
12.5000 mg | ORAL_SOLUTION | Freq: Four times a day (QID) | ORAL | Status: DC | PRN
  Filled 2012-01-17: qty 5

## 2012-01-17 MED ORDER — LACTATED RINGERS IV SOLN
INTRAVENOUS | Status: DC
  Administered 2012-01-17: 17:00:00 via INTRAVENOUS
  Administered 2012-01-17: 1000 mL via INTRAVENOUS
  Administered 2012-01-17 (×2): via INTRAVENOUS

## 2012-01-17 SURGICAL SUPPLY — 59 items
APPLICATOR COTTON TIP 6IN STRL (MISCELLANEOUS) IMPLANT
BLADE EXTENDED COATED 6.5IN (ELECTRODE) ×2 IMPLANT
BLADE HEX COATED 2.75 (ELECTRODE) ×2 IMPLANT
BLADE SURG SZ10 CARB STEEL (BLADE) ×2 IMPLANT
CANISTER SUCTION 2500CC (MISCELLANEOUS) ×2 IMPLANT
CLIP TI LARGE 6 (CLIP) IMPLANT
CLOTH BEACON ORANGE TIMEOUT ST (SAFETY) ×2 IMPLANT
COVER MAYO STAND STRL (DRAPES) ×2 IMPLANT
COVER SURGICAL LIGHT HANDLE (MISCELLANEOUS) ×2 IMPLANT
DRAPE LAPAROSCOPIC ABDOMINAL (DRAPES) ×2 IMPLANT
DRAPE LG THREE QUARTER DISP (DRAPES) IMPLANT
DRAPE WARM FLUID 44X44 (DRAPE) ×2 IMPLANT
DRSG PAD ABDOMINAL 8X10 ST (GAUZE/BANDAGES/DRESSINGS) IMPLANT
ELECT REM PT RETURN 9FT ADLT (ELECTROSURGICAL) ×2
ELECTRODE REM PT RTRN 9FT ADLT (ELECTROSURGICAL) ×1 IMPLANT
ENSEAL DEVICE STD TIP 35CM (ENDOMECHANICALS) IMPLANT
GLOVE BIOGEL PI IND STRL 7.0 (GLOVE) ×1 IMPLANT
GLOVE BIOGEL PI INDICATOR 7.0 (GLOVE) ×1
GLOVE INDICATOR 8.0 STRL GRN (GLOVE) ×4 IMPLANT
GLOVE SS BIOGEL STRL SZ 8 (GLOVE) ×2 IMPLANT
GLOVE SUPERSENSE BIOGEL SZ 8 (GLOVE) ×2
GOWN STRL NON-REIN LRG LVL3 (GOWN DISPOSABLE) ×2 IMPLANT
GOWN STRL REIN XL XLG (GOWN DISPOSABLE) ×4 IMPLANT
HAND ACTIVATED (MISCELLANEOUS) IMPLANT
KIT BASIN OR (CUSTOM PROCEDURE TRAY) ×2 IMPLANT
LEGGING LITHOTOMY PAIR STRL (DRAPES) IMPLANT
LIGASURE IMPACT 36 18CM CVD LR (INSTRUMENTS) ×2 IMPLANT
NS IRRIG 1000ML POUR BTL (IV SOLUTION) ×4 IMPLANT
PACK GENERAL/GYN (CUSTOM PROCEDURE TRAY) ×2 IMPLANT
RELOAD PROXIMATE 75MM BLUE (ENDOMECHANICALS) ×6 IMPLANT
SCALPEL HARMONIC ACE (MISCELLANEOUS) IMPLANT
SHEARS FOC LG CVD HARMONIC 17C (MISCELLANEOUS) IMPLANT
SPONGE GAUZE 4X4 12PLY (GAUZE/BANDAGES/DRESSINGS) ×2 IMPLANT
SPONGE LAP 18X18 X RAY DECT (DISPOSABLE) ×6 IMPLANT
STAPLER PROXIMATE 75MM BLUE (STAPLE) ×2 IMPLANT
STAPLER VISISTAT 35W (STAPLE) ×2 IMPLANT
SUCTION POOLE TIP (SUCTIONS) ×2 IMPLANT
SUT NOV 1 T60/GS (SUTURE) IMPLANT
SUT NOVA NAB DX-16 0-1 5-0 T12 (SUTURE) IMPLANT
SUT NOVA T20/GS 25 (SUTURE) IMPLANT
SUT PDS AB 1 CTX 36 (SUTURE) IMPLANT
SUT PDS AB 1 TP1 96 (SUTURE) ×4 IMPLANT
SUT PDS AB 3-0 SH 27 (SUTURE) IMPLANT
SUT PDS AB 4-0 SH 27 (SUTURE) IMPLANT
SUT PROLENE 2 0 BLUE (SUTURE) IMPLANT
SUT SILK 2 0 (SUTURE) ×1
SUT SILK 2 0 SH CR/8 (SUTURE) ×2 IMPLANT
SUT SILK 2 0SH CR/8 30 (SUTURE) IMPLANT
SUT SILK 2-0 18XBRD TIE 12 (SUTURE) ×1 IMPLANT
SUT SILK 2-0 30XBRD TIE 12 (SUTURE) IMPLANT
SUT SILK 3 0 (SUTURE) ×2
SUT SILK 3 0 SH CR/8 (SUTURE) ×2 IMPLANT
SUT SILK 3-0 18XBRD TIE 12 (SUTURE) ×2 IMPLANT
SUT VIC AB 2-0 SH 18 (SUTURE) ×2 IMPLANT
SUT VIC AB 3-0 SH 18 (SUTURE) ×2 IMPLANT
TOWEL OR 17X26 10 PK STRL BLUE (TOWEL DISPOSABLE) ×4 IMPLANT
TRAY FOLEY CATH 14FRSI W/METER (CATHETERS) ×2 IMPLANT
WATER STERILE IRR 1500ML POUR (IV SOLUTION) IMPLANT
YANKAUER SUCT BULB TIP NO VENT (SUCTIONS) ×2 IMPLANT

## 2012-01-17 NOTE — Consult Note (Signed)
Ostomy consult requested for pre-surgical marking for possible ileostomy or colostomy.  Assessed patient while standing, sitting, and lying.  Mark placed within rectus muscles, in line of vision, in area free from folds.  Pt wears pants low on abdomen and has large, round soft abd so pouch will have to be placed above belt line.  Mark placed even with umbilicus, 6 cm to the left and 6 cm to the right.  Discussed pouching routines and educational materials given to pt.  WOC team will plan to follow post-op for further ostomy education.     Cammie Mcgee, RN, MSN, [employer]  [phone]

## 2012-01-17 NOTE — Care Management Note (Signed)
    Page 1 of 2   02/13/2012     10:40:39 AM   CARE MANAGEMENT NOTE 02/13/2012  Patient:  [name], [name]   Account Number:  [id number]  Date Initiated:  01/17/2012  Documentation initiated by:  Lorenda Ishihara  Subjective/Objective Assessment:   41 yo male admitted with bowel obstruction, to OR for resection 10/1. PTA lived at home with spouse.     Action/Plan:   from home exp lap and colostomy on [id number] transferred to icu and intubated [id number].   Anticipated DC Date:  02/13/2012   Anticipated DC Plan:  HOME W HOME HEALTH SERVICES  In-house referral  Clinical Social Worker      DC Planning Services  CM consult      [facility] Choice  HOME HEALTH  DURABLE MEDICAL EQUIPMENT   Choice offered to / List presented to:  C-1 Patient   DME arranged  Levan Hurst      DME agency  [facility]     [facility] arranged  HH-1 RN      [facility] agency  [facility]   Status of service:  Completed, signed off Medicare Important Message given?   (If response is "NO", the following Medicare IM given date fields will be blank) Date Medicare IM given:   Date Additional Medicare IM given:    Discharge Disposition:  HOME W HOME HEALTH SERVICES  Per UR Regulation:  Reviewed for med. necessity/level of care/duration of stay  If discussed at Long Length of Stay Meetings, dates discussed:    Comments:  02-02-12 Lorenda Ishihara RN CM 1100 Spoke with patient and family at bedside. Patient improved and no longer eligible for CIR. Plans to d/c home with family. Discussed HH agency list/choice, patient and family chose [facility]. Will likely need RN for wound/ostomy care, PT to continue to address deconditioning. Patient requesting a walker as well to help with mobility. Will await final orders, discussed with Darl Pikes at [facility], she will follow for d/c needs.  01-30-12 Lorenda Ishihara RN CM 1200 Transferred out of ICU, weaning TNA, advancing diet, wound care and  drain.  [id number]/Rhonda Earlene Plater, RN, BSN, CCM: CHART REVIEWED AND UPDATED. NO DISCHARGE NEEDS PRESENT AT THIS TIME. CASE MANAGEMENT [phone]   [id number]/Rhonda Davis,RN,BSN,CCM: Patient taken to or on [id number] for abd pain-obstruction versus perforation, colectomy with colostomy required, post op patient became hypotensive requiring iv fld blous transferred to icu for monitoring.

## 2012-01-17 NOTE — Preoperative (Signed)
Beta Blockers   Reason not to administer Beta Blockers:Not Applicable 

## 2012-01-17 NOTE — Progress Notes (Addendum)
INITIAL ADULT NUTRITION ASSESSMENT Date: 01/17/2012   Time: 11:09 AM Reason for Assessment: Nutrition risk   INTERVENTION: Diet advancement per MD. Will monitor.   Pt meets criteria for severe malnutrition of acute illness AEB <50% estimated energy intake for at least the past 5 days with 9.5% weight loss in the past month.   ASSESSMENT: Male 41 y.o.  Dx: Colon obstruction  Food/Nutrition Related Hx: Pt known to RD from previous admission during which pt was educated on low fiber diet for diverticulitis. Pt d/c from hospital on 12/26/11 and pt reports poor intake since then with only 2 solid regular meals during this time frame. Pt reports he was eating mostly soft foods because he has no appetite in addition to nausea and dry heaves. Pt down 29 pounds unintentionally in the past month. Pt with NGT in place however still c/o nausea even though dry heaves have resolved. Per RN pt had 4-5 NGT canisters emptied since pt seen in ED. Pt reports no BM in the past 5 days. Pt found to have descending colon obstruction. Pt scheduled to have surgery possibly today for partial colectomy and colostomy.  Hx:  Past Medical History  Diagnosis Date  . Urinary anastomotic stricture undiagnosed     hard to be cathed; urology did the last time  . Diverticulitis   . Colon obstruction 01/17/2012  . Obesity, Class III, BMI 40-49.9 (morbid obesity) 01/17/2012   Related Meds:  Scheduled Meds:   . enoxaparin  40 mg Subcutaneous Q24H  .  HYDROmorphone (DILAUDID) injection  1 mg Intravenous Once  .  HYDROmorphone (DILAUDID) injection  1 mg Intravenous Once  . influenza  inactive virus vaccine  0.5 mL Intramuscular Tomorrow-1000  . ondansetron (ZOFRAN) IV  4 mg Intravenous Once  . ondansetron (ZOFRAN) IV  4 mg Intravenous Once  . pantoprazole (PROTONIX) IV  40 mg Intravenous QHS  . piperacillin-tazobactam (ZOSYN)  IV  3.375 g Intravenous Q8H  . sodium chloride  1,000 mL Intravenous Once  . DISCONTD: sodium  chloride   Intravenous Once   Continuous Infusions:   . dextrose 5 % and 0.9 % NaCl with KCl 20 mEq/L 125 mL/hr at 01/17/12 0500   PRN Meds:.HYDROmorphone (DILAUDID) injection, iohexol, ondansetron, phenol  Ht: 5\' 10"  (177.8 cm)  Wt: 275 lb 1.6 oz (124.785 kg)  Ideal Wt: 166 lb % Ideal Wt: 165  Usual Wt: 304 lb in September 2013 % Usual Wt: 90  Wt Readings from Last 5 Encounters:  01/17/12 275 lb 1.6 oz (124.785 kg)  01/17/12 275 lb 1.6 oz (124.785 kg)  12/24/11 304 lb 3.8 oz (138 kg)    Body mass index is 39.47 kg/(m^2). Class II obesity    Labs:  CMP     Component Value Date/Time   NA 136 01/17/2012 0353   K 4.1 01/17/2012 0353   CL 100 01/17/2012 0353   CO2 25 01/17/2012 0353   GLUCOSE 125* 01/17/2012 0353   BUN 12 01/17/2012 0353   CREATININE 0.59 01/17/2012 0353   CALCIUM 8.8 01/17/2012 0353   PROT 6.8 01/17/2012 0353   ALBUMIN 2.8* 01/17/2012 0353   AST 15 01/17/2012 0353   ALT 18 01/17/2012 0353   ALKPHOS 102 01/17/2012 0353   BILITOT 0.4 01/17/2012 0353   GFRNONAA >90 01/17/2012 0353   GFRAA >90 01/17/2012 0353    Intake/Output Summary (Last 24 hours) at 01/17/12 1116 Last data filed at 01/17/12 1000  Gross per 24 hour  Intake   1225 ml  Output    850 ml  Net    375 ml   NGT output - since morning shift, dark green, brown per RN  Last BM - 5 days PTA per pt report  Diet Order: NPO   IVF:    dextrose 5 % and 0.9 % NaCl with KCl 20 mEq/L Last Rate: 125 mL/hr at 01/17/12 0500    Estimated Nutritional Needs:   Kcal:1900-2250 Protein:90-115g Fluid:1.9-2.2L  NUTRITION DIAGNOSIS: -Inadequate oral intake (NI-2.1).  Status: Ongoing  RELATED TO: descending colon obstruction  AS EVIDENCE BY: NPO  MONITORING/EVALUATION(Goals): Advance diet as tolerated to low fiber diet.   EDUCATION NEEDS: -No education needs identified at this time   Dietitian #: [phone]  DOCUMENTATION CODES Per approved criteria  -Severe malnutrition in the context of  acute illness or injury -Obesity Unspecified    Marshall Cork 01/17/2012, 11:09 AM

## 2012-01-17 NOTE — Progress Notes (Signed)
OR for partial colectomy and colostomy since not preppable and obstructed.  The procedure was discussed with the patient.   partial colectomy and colostomy  discussed with the patient as well as non operative treatments. The risks of operative management include bleeding,  Infection,  Leak of anastamosis,  Ostomy formation, open procedure,  Sepsis,  Abcess,  Hernia,  DVT,  Pulmonary complications,  Cardiovascular  complications,  Injury to ureter,  Bladder,kidney,and anesthesia risks,  And death. The patient understands.  Questions answered.   The success of the procedure is 50-100 % for treating the patients symptoms. They agree to proceed.

## 2012-01-17 NOTE — Progress Notes (Signed)
Subjective: Feels somewhat better.  Objective: Vital signs in last 24 hours: Temp:  [97.5 F (36.4 C)-100.2 F (37.9 C)] 98.1 F (36.7 C) (10/01 0524) Pulse Rate:  [98-114] 98  (10/01 0524) Resp:  [18-20] 18  (10/01 0524) BP: (122-156)/(80-97) 128/89 mmHg (10/01 0524) SpO2:  [90 %-97 %] 97 % (10/01 0524) Weight:  [136.079 kg (300 lb)] 136.079 kg (300 lb) (10/01 0100)   Tm 100.2, VSS, labs OK  Intake/Output from previous day: 09/30 0701 - 10/01 0700 In: 1225 [I.V.:1125; IV Piggyback:100] Out: 850 [Urine:850] Intake/Output this shift:    General appearance: alert, cooperative and no distress Resp: clear to auscultation bilaterally GI: soft, distended not really tender, few BS, 750 ml NG drainage.  Lab Results:   Basename 01/17/12 0353 01/16/12 1210  WBC 7.4 14.4*  HGB 15.5 15.9  HCT 45.7 47.3  PLT 496* 569*    BMET  Basename 01/17/12 0353 01/16/12 1210  NA 136 133*  K 4.1 4.5  CL 100 98  CO2 25 22  GLUCOSE 125* 130*  BUN 12 9  CREATININE 0.59 0.70  CALCIUM 8.8 9.7   PT/INR No results found for this basename: LABPROT:2,INR:2 in the last 72 hours   Lab 01/17/12 0353 01/16/12 1210  AST 15 15  ALT 18 25  ALKPHOS 102 120*  BILITOT 0.4 0.5  PROT 6.8 7.7  ALBUMIN 2.8* 3.3*     Lipase     Component Value Date/Time   LIPASE 24 01/16/2012 1210     Studies/Results: Ct Abdomen Pelvis W Contrast  01/16/2012  *RADIOLOGY REPORT*  Clinical Data: 41 year old male with continued and increasing abdominal and pelvic pain.  CT ABDOMEN AND PELVIS WITH CONTRAST  Technique:  Multidetector CT imaging of the abdomen and pelvis was performed following the standard protocol during bolus administration of intravenous contrast.  Contrast: OMNIPAQUE IOHEXOL 300 MG/ML  SOLN  Comparison: 12/23/2011 and 08/25/2009 CTs  Findings: Dilated small bowel and colon is identified extending to circumferential narrowing/wall thickening of the mid descending colon (6 cm in length)  with adjacent stranding and mildly enlarged pericolonic lymph nodes.  There is a 3 cm area of low density extension from the colonic wall thickening through the peritoneum/lateral pericolic gutter fascia which abuts the transversus abdominus muscle.  A tiny amount of free fluid within the right pericolic gutter is noted.  There is no evidence of pneumoperitoneum, enlarged lymph nodes, biliary dilation or abdominal aortic aneurysm identified.  The liver, spleen, adrenal glands, pancreas, kidneys and gallbladder are unremarkable.  The bladder and appendix are unremarkable.  No acute or suspicious bony abnormalities are identified. Several chronic disc protrusion/bulges within the thoracic and lumbar spine are noted.  IMPRESSION: High-grade colonic and small bowel obstruction from mid-descending colonic wall thickening with adjacent stranding, mildly enlarged pericolonic lymph nodes and low density extension through the peritoneum/pericolic gutter fascia.  Although this could represent an inflammatory or infectious process with adjacent phlegmon, these findings are suspicious for neoplasm and further evaluation / direct inspection is recommended. No evidence of pneumoperitoneum.  These results were called to Dr. Juleen China on 01/16/2012 at 4:17 p.m.   Original Report Authenticated By: Rosendo Gros, M.D.     Medications:    . enoxaparin  40 mg Subcutaneous Q24H  .  HYDROmorphone (DILAUDID) injection  1 mg Intravenous Once  .  HYDROmorphone (DILAUDID) injection  1 mg Intravenous Once  . influenza  inactive virus vaccine  0.5 mL Intramuscular Tomorrow-1000  . ondansetron (ZOFRAN) IV  4 mg Intravenous Once  . ondansetron (ZOFRAN) IV  4 mg Intravenous Once  . pantoprazole (PROTONIX) IV  40 mg Intravenous QHS  . piperacillin-tazobactam (ZOSYN)  IV  3.375 g Intravenous Q8H  . sodium chloride  1,000 mL Intravenous Once  . 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 Discussed with pt and his wife they have no real questions at this time.  LOS: 1 day    [name] 01/17/2012

## 2012-01-17 NOTE — Anesthesia Preprocedure Evaluation (Addendum)
Anesthesia Evaluation  Patient identified by MRN, date of birth, ID band Patient awake    Reviewed: Allergy & Precautions, H&P , NPO status , Patient's Chart, lab work & pertinent test results  Airway Mallampati: II TM Distance: >3 FB Neck ROM: full    Dental No notable dental hx. (+) Edentulous Upper and Edentulous Lower   Pulmonary neg pulmonary ROS,  breath sounds clear to auscultation  Pulmonary exam normal       Cardiovascular negative cardio ROS  Rhythm:regular Rate:Normal     Neuro/Psych negative neurological ROS  negative psych ROS   GI/Hepatic negative GI ROS, Neg liver ROS,   Endo/Other  negative endocrine ROSMorbid obesity  Renal/GU negative Renal ROS  negative genitourinary   Musculoskeletal   Abdominal (+) + obese,   Peds  Hematology negative hematology ROS (+)   Anesthesia Other Findings   Reproductive/Obstetrics negative OB ROS                          Anesthesia Physical Anesthesia Plan  ASA: III  Anesthesia Plan: General   Post-op Pain Management:    Induction: Intravenous, Rapid sequence and Cricoid pressure planned  Airway Management Planned: Oral ETT  Additional Equipment:   Intra-op Plan:   Post-operative Plan: Extubation in OR  Informed Consent: I have reviewed the patients History and Physical, chart, labs and discussed the procedure including the risks, benefits and alternatives for the proposed anesthesia with the patient or authorized representative who has indicated his/her understanding and acceptance.   Dental Advisory Given  Plan Discussed with: CRNA and Surgeon  Anesthesia Plan Comments:         Anesthesia Quick Evaluation

## 2012-01-17 NOTE — Anesthesia Postprocedure Evaluation (Signed)
  Anesthesia Post-op Note  Patient: [name]  Procedure(s) Performed: Procedure(s) (LRB): PARTIAL COLECTOMY (N/A) COLOSTOMY (N/A)  Patient Location: PACU  Anesthesia Type: General  Level of Consciousness: awake and alert   Airway and Oxygen Therapy: Patient Spontanous Breathing  Post-op Pain: mild  Post-op Assessment: Post-op Vital signs reviewed, Patient's Cardiovascular Status Stable, Respiratory Function Stable, Patent Airway and No signs of Nausea or vomiting  Post-op Vital Signs: stable  Complications: No apparent anesthesia complications

## 2012-01-17 NOTE — Op Note (Signed)
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  Assistant: Dr. Corliss Skains MD  Anesthesia: Gen.  EBL: 500 cc  Specimen: Descending colon suture on distal margin  Drains: None  IV fluids: 3 L crystalloid  Indications for procedure: The patient was admitted yesterday with a large valve structure and do to a stricture is descending colon. He was seen one month ago and admitted for diverticulitis and discharged. Review of the CT scan showed the stricture one month ago. He did not followup. He continued to have abdominal pain, change in bowel function and a 40 pound weight loss. I discussed options of operative resection. I explained in the need for colostomy in the setting with obstruction. Risk of bleeding, infection, wound breakdown, organ injury, injury to the ureter, injury to kidney, injury to major blood vessels, pneumonia, cardiovascular compromise, myocardial infarction, stroke, blood clots, and the need for other procedures discussed. Discussed sepsis and possible death from that. He voiced understanding and agreed to proceed. Questions were answered.  Description of procedure: The patient was met holding area and questions were answered. He is taken back to the operating room placed supine where general anesthesia was initiated. Abdomen prepped and draped in a sterile fashion Foley catheter placed. Timeout done. He received 1 g of Invanz. Midline incision was used dissection was carried down into the abdominal cavity. He had massively dilated loops of small bowel weight was at 300 pounds. He was very difficult to see. There is ascites. The stricture was palpated was in the distal descending colon was densely adherent to the retroperitoneum. Upon palpation there is an abscess cavity associated with  it.  A Bookwalter retractor was used. We mobilized the colon from the proximal sigmoid. The inflammatory stricture was densely adherent to the retroperitoneum it was perforated there as well as was contained. The mobilized splenic flexure. This was very difficult due to his large body habitus. I divided the colon distal to the middle colic vessels. Mesentery was taken down with the LigaSure. We then divided the colon just distal to the stricture in the mid sigmoid colon. This was marked with a silk suture. Specimen was passed off the field. We were well away from the left ureter. NG tube in the stomach and well-positioned. A circular incision was made left upper quadrant. Dissection was carried down to the rectus muscles and the abdominal cavity. The colon was massively dilated would not extend down to the previously marked colostomy site. The ostomy was brought out to the upper abdominal wound. A single stitch of Vicryl was used to hold the ostomy the fascia. Irrigation was used and suctioned out. Small bowel was examined there is no evidence of injury. All counts are correct this point. Fascia closed with double-stranded #1 PDS. Wound VAC placed into the incision. This is placed to suction with good seal. Colostomy matured with 3-0 Vicryl. He was viable. This was difficult to do do to his morbid obesity. All final counts are found to be correct. Patient was awoke taken recovery in satisfactory condition.

## 2012-01-17 NOTE — Transfer of Care (Signed)
Immediate Anesthesia Transfer of Care Note  Patient: [name]  Procedure(s) Performed: Procedure(s) (LRB) with comments: PARTIAL COLECTOMY (N/A) COLOSTOMY (N/A)  Patient Location: PACU  Anesthesia Type: General  Level of Consciousness: awake, oriented and patient cooperative  Airway & Oxygen Therapy: Patient Spontanous Breathing and Patient connected to face mask oxygen  Post-op Assessment: Report given to PACU RN and Post -op Vital signs reviewed and stable  Post vital signs: Reviewed and stable  Complications: No apparent anesthesia complications

## 2012-01-17 NOTE — Addendum Note (Signed)
Addendum  created 01/17/12 1939 by Gaetano Hawthorne, MD   Modules edited:Orders

## 2012-01-18 ENCOUNTER — Encounter (HOSPITAL_COMMUNITY): Payer: Self-pay | Admitting: Surgery

## 2012-01-18 ENCOUNTER — Inpatient Hospital Stay (HOSPITAL_COMMUNITY): Payer: Medicaid Other

## 2012-01-18 DIAGNOSIS — G9340 Encephalopathy, unspecified: Secondary | ICD-10-CM | POA: Diagnosis not present

## 2012-01-18 DIAGNOSIS — J9601 Acute respiratory failure with hypoxia: Secondary | ICD-10-CM | POA: Diagnosis not present

## 2012-01-18 DIAGNOSIS — J810 Acute pulmonary edema: Secondary | ICD-10-CM | POA: Diagnosis not present

## 2012-01-18 DIAGNOSIS — K56609 Unspecified intestinal obstruction, unspecified as to partial versus complete obstruction: Secondary | ICD-10-CM

## 2012-01-18 DIAGNOSIS — J9600 Acute respiratory failure, unspecified whether with hypoxia or hypercapnia: Secondary | ICD-10-CM

## 2012-01-18 LAB — BLOOD GAS, ARTERIAL
Acid-base deficit: 1.9 mmol/L (ref 0.0–2.0)
Acid-base deficit: 0.8 mmol/L (ref 0.0–2.0)
Bicarbonate: 23.9 mEq/L (ref 20.0–24.0)
Drawn by: 244901
FIO2: 1 %
FIO2: 1 %
MECHVT: 580 mL
O2 Saturation: 89 %
TCO2: 21.8 mmol/L (ref 0–100)
pCO2 arterial: 48.1 mmHg — ABNORMAL HIGH (ref 35.0–45.0)
pCO2 arterial: 48.4 mmHg — ABNORMAL HIGH (ref 35.0–45.0)
pO2, Arterial: 63.8 mmHg — ABNORMAL LOW (ref 80.0–100.0)

## 2012-01-18 LAB — CBC
HCT: 51.4 % (ref 39.0–52.0)
Hemoglobin: 14.7 g/dL (ref 13.0–17.0)
MCH: 32.1 pg (ref 26.0–34.0)
MCV: 92.6 fL (ref 78.0–100.0)
Platelets: 421 10*3/uL — ABNORMAL HIGH (ref 150–400)
Platelets: 528 10*3/uL — ABNORMAL HIGH (ref 150–400)
RBC: 4.73 MIL/uL (ref 4.22–5.81)
RBC: 5.55 MIL/uL (ref 4.22–5.81)
WBC: 16.7 10*3/uL — ABNORMAL HIGH (ref 4.0–10.5)

## 2012-01-18 LAB — COMPREHENSIVE METABOLIC PANEL
ALT: 14 U/L (ref 0–53)
AST: 15 U/L (ref 0–37)
AST: 22 U/L (ref 0–37)
Alkaline Phosphatase: 66 U/L (ref 39–117)
BUN: 11 mg/dL (ref 6–23)
CO2: 23 mEq/L (ref 19–32)
CO2: 26 mEq/L (ref 19–32)
Calcium: 8 mg/dL — ABNORMAL LOW (ref 8.4–10.5)
Chloride: 102 mEq/L (ref 96–112)
Chloride: 107 mEq/L (ref 96–112)
Creatinine, Ser: 0.71 mg/dL (ref 0.50–1.35)
GFR calc Af Amer: >90 mL/min (ref >90)
GFR calc Af Amer: >90 mL/min (ref >90)
GFR calc non Af Amer: >90 mL/min (ref >90)
GFR calc non Af Amer: >90 mL/min (ref >90)
Glucose, Bld: 137 mg/dL — ABNORMAL HIGH (ref 70–99)
Glucose, Bld: 211 mg/dL — ABNORMAL HIGH (ref 70–99)
Potassium: 4.6 mEq/L (ref 3.5–5.1)
Sodium: 141 mEq/L (ref 135–145)
Total Bilirubin: 0.7 mg/dL (ref 0.3–1.2)

## 2012-01-18 LAB — TROPONIN I: Troponin I: <0.3 ng/mL (ref <0.30)

## 2012-01-18 LAB — GLUCOSE, CAPILLARY

## 2012-01-18 LAB — LACTIC ACID, PLASMA: Lactic Acid, Venous: 2.2 mmol/L (ref 0.5–2.2)

## 2012-01-18 MED ORDER — SODIUM CHLORIDE 0.9 % IV SOLN
1.0000 mg/h | INTRAVENOUS | Status: DC
  Administered 2012-01-18: 3 mg/h via INTRAVENOUS
  Administered 2012-01-19: 2 mg/h via INTRAVENOUS
  Administered 2012-01-19: 5 mg/h via INTRAVENOUS
  Administered 2012-01-20: 2 mg/h via INTRAVENOUS
  Administered 2012-01-20: 5 mg/h via INTRAVENOUS
  Administered 2012-01-20: 4 mg/h via INTRAVENOUS
  Administered 2012-01-20 – 2012-01-21 (×2): 2 mg/h via INTRAVENOUS
  Administered 2012-01-21: 4 mg/h via INTRAVENOUS
  Filled 2012-01-18 (×7): qty 10

## 2012-01-18 MED ORDER — FENTANYL CITRATE 0.05 MG/ML IJ SOLN
INTRAMUSCULAR | Status: AC
Start: 2012-01-18 — End: 2012-01-18
  Administered 2012-01-18: 100 ug
  Filled 2012-01-18: qty 2

## 2012-01-18 MED ORDER — INSULIN ASPART 100 UNIT/ML ~~LOC~~ SOLN
0.0000 [IU] | SUBCUTANEOUS | Status: DC
  Administered 2012-01-18 – 2012-01-22 (×10): 2 [IU] via SUBCUTANEOUS
  Administered 2012-01-22: 3 [IU] via SUBCUTANEOUS
  Administered 2012-01-22 – 2012-01-23 (×3): 2 [IU] via SUBCUTANEOUS
  Administered 2012-01-23: 3 [IU] via SUBCUTANEOUS
  Administered 2012-01-23: 2 [IU] via SUBCUTANEOUS
  Administered 2012-01-23 (×2): 3 [IU] via SUBCUTANEOUS
  Administered 2012-01-23 – 2012-01-24 (×2): 2 [IU] via SUBCUTANEOUS
  Administered 2012-01-24 (×2): 3 [IU] via SUBCUTANEOUS

## 2012-01-18 MED ORDER — ROCURONIUM BROMIDE 50 MG/5ML IV SOLN
INTRAVENOUS | Status: AC
  Filled 2012-01-18: qty 2

## 2012-01-18 MED ORDER — SODIUM CHLORIDE 0.9 % IV BOLUS (SEPSIS)
1000.0000 mL | Freq: Once | INTRAVENOUS | Status: AC
  Administered 2012-01-18: 1000 mL via INTRAVENOUS

## 2012-01-18 MED ORDER — FUROSEMIDE 10 MG/ML IJ SOLN
40.0000 mg | Freq: Four times a day (QID) | INTRAMUSCULAR | Status: AC
  Administered 2012-01-18 – 2012-01-20 (×6): 40 mg via INTRAVENOUS
  Filled 2012-01-18 (×6): qty 4

## 2012-01-18 MED ORDER — SUCCINYLCHOLINE CHLORIDE 20 MG/ML IJ SOLN
INTRAMUSCULAR | Status: AC
  Filled 2012-01-18: qty 1

## 2012-01-18 MED ORDER — ALBUTEROL SULFATE (5 MG/ML) 0.5% IN NEBU: 2.5000 mg | INHALATION_SOLUTION | Freq: Four times a day (QID) | RESPIRATORY_TRACT | Status: DC

## 2012-01-18 MED ORDER — MIDAZOLAM HCL 5 MG/ML IJ SOLN
INTRAMUSCULAR | Status: AC
  Administered 2012-01-18: 2 mg
  Filled 2012-01-18: qty 1

## 2012-01-18 MED ORDER — IPRATROPIUM-ALBUTEROL 18-103 MCG/ACT IN AERO
6.0000 | INHALATION_SPRAY | RESPIRATORY_TRACT | Status: DC
  Administered 2012-01-19 – 2012-01-23 (×28): 6 via RESPIRATORY_TRACT
  Filled 2012-01-18: qty 14.7

## 2012-01-18 MED ORDER — IPRATROPIUM-ALBUTEROL 18-103 MCG/ACT IN AERO
6.0000 | INHALATION_SPRAY | RESPIRATORY_TRACT | Status: DC | PRN
  Filled 2012-01-18: qty 14.7

## 2012-01-18 MED ORDER — CHLORHEXIDINE GLUCONATE 0.12 % MT SOLN
15.0000 mL | Freq: Two times a day (BID) | OROMUCOSAL | Status: DC
  Administered 2012-01-18 (×2): 15 mL via OROMUCOSAL
  Filled 2012-01-18 (×2): qty 15

## 2012-01-18 MED ORDER — SODIUM CHLORIDE 0.9 % IV SOLN
25.0000 ug/h | INTRAVENOUS | Status: DC
  Administered 2012-01-18: 150 ug/h via INTRAVENOUS
  Administered 2012-01-19: 100 ug/h via INTRAVENOUS
  Administered 2012-01-19: 200 ug/h via INTRAVENOUS
  Administered 2012-01-19: 50 ug/h via INTRAVENOUS
  Administered 2012-01-20 (×2): 200 ug/h via INTRAVENOUS
  Administered 2012-01-20 – 2012-01-21 (×2): 100 ug/h via INTRAVENOUS
  Filled 2012-01-18 (×5): qty 50

## 2012-01-18 MED ORDER — LEVALBUTEROL HCL 0.63 MG/3ML IN NEBU
0.6300 mg | INHALATION_SOLUTION | Freq: Four times a day (QID) | RESPIRATORY_TRACT | Status: DC | PRN
  Administered 2012-01-18: 0.63 mg via RESPIRATORY_TRACT
  Filled 2012-01-18: qty 3

## 2012-01-18 MED ORDER — BIOTENE DRY MOUTH MT LIQD
15.0000 mL | Freq: Two times a day (BID) | OROMUCOSAL | Status: DC
  Administered 2012-01-18 (×2): 15 mL via OROMUCOSAL

## 2012-01-18 MED ORDER — LIDOCAINE HCL (CARDIAC) 20 MG/ML IV SOLN
INTRAVENOUS | Status: AC
  Filled 2012-01-18: qty 5

## 2012-01-18 MED ORDER — FUROSEMIDE 10 MG/ML IJ SOLN
40.0000 mg | Freq: Once | INTRAMUSCULAR | Status: AC
  Administered 2012-01-18: 40 mg via INTRAVENOUS
  Filled 2012-01-18: qty 4

## 2012-01-18 MED ORDER — ONDANSETRON HCL 4 MG/2ML IJ SOLN
4.0000 mg | INTRAMUSCULAR | Status: DC | PRN
  Administered 2012-01-18 – 2012-01-28 (×3): 4 mg via INTRAVENOUS
  Filled 2012-01-18 (×2): qty 2

## 2012-01-18 MED ORDER — ETOMIDATE 2 MG/ML IV SOLN
INTRAVENOUS | Status: AC
  Administered 2012-01-18: 20 mg
  Filled 2012-01-18: qty 20

## 2012-01-18 NOTE — Progress Notes (Signed)
Pt alert and orientedx4.  Surgeon on call, Dr. Johna Sheriff, notified of tachycardia and low urine output.  New orders, administer 1L Normal saline bolus over one hour, received.

## 2012-01-18 NOTE — Progress Notes (Signed)
1 Day Post-Op  Subjective: sore  Objective: Vital signs in last 24 hours: Temp:  [98 F (36.7 C)-100 F (37.8 C)] 98.6 F (37 C) (10/02 0800) Pulse Rate:  [89-134] 124  (10/02 0700) Resp:  [16-40] 20  (10/02 0700) BP: (112-154)/(81-104) 116/82 mmHg (10/02 0700) SpO2:  [90 %-98 %] 93 % (10/02 0700) FiO2 (%):  [93 %] 93 % (10/02 0750) Weight:  [275 lb 1.6 oz (124.785 kg)-300 lb 11.3 oz (136.4 kg)] 300 lb 11.3 oz (136.4 kg) (10/02 0000)    Intake/Output from previous day: 10/01 0701 - 10/02 0700 In: 5133.5 [I.V.:5041; IV Piggyback:92.5] Out: 1672 [Urine:607; Emesis/NG output:665; Blood:400] Intake/Output this shift:    Cardio: regular rate and rhythm, S1, S2 normal, no murmur, click, rub or gallop Incision/Wound:vac in place ostomy retracted but viable  Lab Results:   [facility] 01/18/12 01/17/12 0353  WBC 7.6 7.4  HGB 17.8* 15.5  HCT 51.4 45.7  PLT 528* 496*   BMET  Basename 01/18/12 01/17/12 0353  NA 135 136  K 4.8 4.1  CL 102 100  CO2 23 25  GLUCOSE 211* 125*  BUN 11 12  CREATININE 0.71 0.59  CALCIUM 8.0* 8.8   PT/INR No results found for this basename: LABPROT:2,INR:2 in the last 72 hours ABG No results found for this basename: PHART:2,PCO2:2,PO2:2,HCO3:2 in the last 72 hours  Studies/Results: Ct Abdomen Pelvis W Contrast  01/16/2012  *RADIOLOGY REPORT*  Clinical Data: 41 year old male with continued and increasing abdominal and pelvic pain.  CT ABDOMEN AND PELVIS WITH CONTRAST  Technique:  Multidetector CT imaging of the abdomen and pelvis was performed following the standard protocol during bolus administration of intravenous contrast.  Contrast: OMNIPAQUE IOHEXOL 300 MG/ML  SOLN  Comparison: 12/23/2011 and 08/25/2009 CTs  Findings: Dilated small bowel and colon is identified extending to circumferential narrowing/wall thickening of the mid descending colon (6 cm in length) with adjacent stranding and mildly enlarged pericolonic lymph nodes.  There is a 3  cm area of low density extension from the colonic wall thickening through the peritoneum/lateral pericolic gutter fascia which abuts the transversus abdominus muscle.  A tiny amount of free fluid within the right pericolic gutter is noted.  There is no evidence of pneumoperitoneum, enlarged lymph nodes, biliary dilation or abdominal aortic aneurysm identified.  The liver, spleen, adrenal glands, pancreas, kidneys and gallbladder are unremarkable.  The bladder and appendix are unremarkable.  No acute or suspicious bony abnormalities are identified. Several chronic disc protrusion/bulges within the thoracic and lumbar spine are noted.  IMPRESSION: High-grade colonic and small bowel obstruction from mid-descending colonic wall thickening with adjacent stranding, mildly enlarged pericolonic lymph nodes and low density extension through the peritoneum/pericolic gutter fascia.  Although this could represent an inflammatory or infectious process with adjacent phlegmon, these findings are suspicious for neoplasm and further evaluation / direct inspection is recommended. No evidence of pneumoperitoneum.  These results were called to Dr. Juleen China on 01/16/2012 at 4:17 p.m.   Original Report Authenticated By: Rosendo Gros, M.D.     Anti-infectives: Anti-infectives     Start     Dose/Rate Route Frequency Ordered Stop   01/16/12 1800   piperacillin-tazobactam (ZOSYN) IVPB 3.375 g        3.375 g 12.5 mL/hr over 240 Minutes Intravenous 3 times per day 01/16/12 1721            Assessment/Plan: s/p Procedure(s) (LRB) with comments: PARTIAL COLECTOMY (N/A) COLOSTOMY (N/A) Keep foley Ice  follw labs DVT prevention  WOCN to see  LOS: 2 days    Vi Biddinger A. 01/18/2012

## 2012-01-18 NOTE — Progress Notes (Signed)
Inpatient Diabetes Program Recommendations  AACE/ADA: New Consensus Statement on Inpatient Glycemic Control (2013)  Target Ranges:  Prepandial:   less than 140 mg/dL      Peak postprandial:   less than 180 mg/dL (1-2 hours)      Critically ill patients:  140 - 180 mg/dL   Reason for Visit: No history of diabetes however lab glucose this morning was 211 mg/dL.  Consider CBG's q 4 hours to assess glycemic control.  If greater than 140 mg/dL may consider sensitive correction.  Also please check A1C to determine pre-hospitalization glycemic control.

## 2012-01-18 NOTE — Progress Notes (Signed)
He has been reintubated and CCM now placing central line for HD monitoring.  HR improved since intubation

## 2012-01-18 NOTE — Procedures (Signed)
Name: [name] MRN: [id number] DOB: 1971/01/23   PROCEDURE NOTE  Procedure:  Endotracheal intubation.  Indication:  Acute respiratory failure  Consent:  Consent was implied due to the emergency nature of the procedure.  Anesthesia:  A total of 10 mg of Etomidate was given intravenously.  Procedure summary:  Appropriate equipment was assembled. The patient was identified as [name] and safety timeout was performed. The patient was placed supine, with head in sniffing position. After adequate level of anesthesia was achieved, a GS#4 blade was inserted into the oropharynx and the vocal cords were visualized. A 8.0 endotracheal tube was inserted without difficulty and visualized going through the vocal cords. The stylette was removed and cuff inflated. Colorimetric change was noted on the CO2 meter. Breath sounds were heard over both lung fields equally. Post procedure chest xray was ordered.  Complications:  No immediate complications were noted.  Hemodynamic parameters and oxygenation remained stable throughout the procedure.    Orlean Bradford, M.D. Pulmonary and Critical Care Medicine [facility] Cell: [phone] Pager: [phone]  01/18/2012, 10:50 PM

## 2012-01-18 NOTE — Progress Notes (Signed)
Contacted MD on call, B. Biagio Quint, MD regarding glucose of 211. Given verbal orders to start CBG's q4 monitoring with moderate coverage.

## 2012-01-18 NOTE — Progress Notes (Signed)
Paged Warren Lacy, MD, regarding Zofran currently pt has PRN q6, he is nauseated now, next dose is not for two more hours. Also O2 saturation on [place] @ 6L is 88 SPO2, Currently pt has a NRB @ 15L to help get SPO2 to 92, RR rate is 27 - 41.

## 2012-01-18 NOTE — Consult Note (Signed)
WOC consult Note Patient discussed with Zola Button, PA.  We will meet at bedside tomorrow for 1st V.A.C./Surgical dressing change and at that time I will also assess the stoma and apply a new pouching system. Jacki Cones

## 2012-01-18 NOTE — Consult Note (Signed)
Name: [name] MRN: [id number] DOB: June 02, 1970    LOS: 2  Referring Provider:  CCS Reason for Referral:  Acute respiratory failure  PULMONARY / CRITICAL CARE MEDICINE  HPI:  41 yo morbidly obese male with suspected OSA/OHS admitted 9/30 with abdominal pain, nausea and vomiting s/p exploratory laparotomy with partial colectomy and colostomy.  Course is complicated by hypoxemic respiratory failure requiring intubation.  Past Medical History  Diagnosis Date  . Urinary anastomotic stricture undiagnosed     hard to be cathed; urology did the last time  . Diverticulitis   . Colon obstruction 01/17/2012  . Obesity, Class III, BMI 40-49.9 (morbid obesity) 01/17/2012   Past Surgical History  Procedure Date  . No past surgeries   . Partial colectomy 01/17/2012    Procedure: PARTIAL COLECTOMY;  Surgeon: Clovis Pu. Cornett, MD;  Location: [facility] ORS;  Service: General;  Laterality: N/A;  . 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 Cornett, MD;  Location: [facility] ORS;  Service: General;  Laterality: N/A;   Prior to Admission medications   Medication Sig Start Date End Date Taking? Authorizing Provider  cyclobenzaprine (FLEXERIL) 10 MG tablet Take 1 tablet (10 mg total) by mouth 2 (two) times daily as needed for muscle spasms. 01/07/12  Yes Dorthula Matas, PA  HYDROcodone-acetaminophen (NORCO/VICODIN) 5-325 MG per tablet Take 1 tablet by mouth every 6 (six) hours as needed. For pain.   Yes Historical Provider, MD  HYDROcodone-acetaminophen (VICODIN) 5-500 MG per tablet Take 1-2 tablets by mouth every 6 (six) hours as needed for pain. 01/07/12   Dorthula Matas, PA  naproxen sodium (ANAPROX) 220 MG tablet Take 440 mg by mouth 2 (two) times daily with a meal.    Historical Provider, MD   Allergies No Known Allergies  Family History History reviewed. No pertinent family history.  Social History  reports that he has been smoking Cigarettes.  He has a 40 pack-year smoking  history. He has never used smokeless tobacco. He reports that he does not drink alcohol or use illicit drugs.  Review Of Systems:  Unable to provide  Brief patient description:  42 yo morbidly obese male with suspected OSA/OHS admitted 9/30 with abdominal pain, nausea and vomiting s/p exploratory laparotomy with partial colectomy and colostomy.  Course is complicated by hypoxemic respiratory failure requiring intubation.  Events Since Admission: 9/30  Admitted with abdominal pain, nausea and vomiting 10/1  Exploratory laparotomy, partial colectomy, colostomy 10/2  Intubated for hypoxemic respiratory failure  Current Status:  Vital Signs: Temp:  [98.6 F (37 C)-100.8 F (38.2 C)] 99.9 F (37.7 C) (10/02 1600) Pulse Rate:  [122-136] 133  (10/02 2040) Resp:  [20-39] 22  (10/02 2040) BP: (112-146)/(59-97) 146/78 mmHg (10/02 2002) SpO2:  [86 %-95 %] 94 % (10/02 2040) FiO2 (%):  [92 %-100 %] 100 % (10/02 2040) Weight:  [136.4 kg (300 lb 11.3 oz)] 136.4 kg (300 lb 11.3 oz) (10/02 0000)  Physical Examination: General:  Mechanically ventilated, synchronous Neuro:  Sedated, nonfocal, cough / gag present HEENT:  PERRL, OETT, NGT Neck:  Cannot assess JVD Cardiovascular:  RRR, tachycardic, no m/r/g Lungs:  Bilateral diminished air entry, diffuse rales Abdomen:  Obese, colostomy, wound vac, bowel sounds absent Musculoskeletal:  Moves allextremities Skin:  No rash  Principal Problem:  *Colon obstruction Active Problems:  Obesity, Class III, BMI 40-49.9 (morbid obesity)  Acute respiratory failure with hypoxia  Acute pulmonary edema  Encephalopathy acute  ASSESSMENT AND PLAN  PULMONARY  Lab 01/18/12 1924  PHART 7.321*  PCO2ART 48.1*  PO2ART 63.8*  HCO3 23.9  O2SAT 89.0   Ventilator Settings: Vent Mode:  [-]  FiO2 (%):  [92 %-100 %] 100 %  CXR:  10/2 >>>  Hypoinflation, bilateral airspace disease consistent with pulmonary edema ETT:  10/2 >>>  A:  Acute hypoxemic /  hypercarbic respiratory failure secondary to acute pulmonary edema in setting of aggressive fluid resuscitation (~ 6L positive) and decreased FRC due to abdominal distension after exploratory lap.  Possible atelectasis. Possible ARDS. Suspected OSA/OHS. P:   Goal SpO2 > 92, pH > 7.30 Full mechanical support PEEP 10 (increase FRC, improve chest compliance in setting of morbid obesity) CXR ABG Daily SBT Combivent  CARDIOVASCULAR  Lab 01/18/12 2006 01/18/12 2005  TROPONINI -- --  LATICACIDVEN 2.3* --  PROBNP -- 364.1*   ECG:  Pending Lines: R Brewster TLC 10/2 >>>  A: Hemodynamically stable.  Must r/o postop ischemia given acute pulmonary edema. P:  Goal MAP > 60 12 lead ECG Trend lactic acid Trend troponin  RENAL  Lab 01/18/12 01/17/12 0353 01/16/12 1210  NA 135 136 133*  K 4.8 4.1 --  CL 102 100 98  CO2 23 25 22   BUN 11 12 9   CREATININE 0.71 0.59 0.70  CALCIUM 8.0* 8.8 9.7  MG -- -- --  PHOS -- -- --   Intake/Output      10/02 0701 - 10/03 0700   I.V. (mL/kg) 1500 (11)   IV Piggyback 1050   Total Intake(mL/kg) 2550 (18.7)   Urine (mL/kg/hr) 775 (0.4)   Emesis/NG output    Blood    Total Output 775   Net +1775        Foley:  10/1 >>>  A:  Clinically volume overloaded.  Normal renal function. P:   IVF to [facility] Goal CVP 3-4 Lasix 40 q6h x 24 hrs Trend BMP  GASTROINTESTINAL  Lab 01/18/12 01/17/12 0353 01/16/12 1210  AST 15 15 15   ALT 15 18 25   ALKPHOS 73 102 120*  BILITOT 0.7 0.4 0.5  PROT 5.5* 6.8 7.7  ALBUMIN 2.2* 2.8* 3.3*   A:  Status post exploratory lap, partial colectomy, colostomy. P:   NPO NGT to Sx Per CCS  HEMATOLOGIC  Lab 01/18/12 01/17/12 0353 01/16/12 1210  HGB 17.8* 15.5 15.9  HCT 51.4 45.7 47.3  PLT 528* 496* 569*  INR -- -- --  APTT -- -- --   A:  Hemoconcentration?  Reactive thrombocytosis. P:  Trend CBC  INFECTIOUS  Lab 01/18/12 2005 01/18/12 01/17/12 0353 01/16/12 1210  WBC -- 7.6 7.4 14.4*  PROCALCITON 2.43 -- -- --    Cultures: NA Antibiotics: Zosyn 9/30 >>>  A:  Suspected peritonitis. P:   Antibiotics / cultures as above  ENDOCRINE  Lab 01/18/12 2011  GLUCAP 135*   A:  Hyperglycemia. P:   Goal CBG 120 to 180 SSI  NEUROLOGIC  A:  Acute encephalopathy secondary to sedation.  Post op pain. P:   Goal RASS 0 to -1 Daily WUA Fentanyl / Versed gtt D/c PCA as intubated  BEST PRACTICE / DISPOSITION Level of Care:  ICU Primary Service:  CCS Consultants:  PCCM Code Status:  Full Diet:  NPO DVT Px:  Protonix GI Px:  Lovenox Skin Integrity:  Intact Social / Family:  Family is updated at bedside  The patient is critically ill with multiple organ systems failure and requires high complexity decision making for assessment and support, frequent  evaluation and titration of therapies, application of advanced monitoring technologies and extensive interpretation of multiple databases. Critical Care Time devoted to patient care services described in this note is 45 minutes.  Lonia Farber, M.D. Pulmonary and Critical Care Medicine [facility] Pager: [phone]  01/18/2012, 9:40 PM

## 2012-01-18 NOTE — Progress Notes (Signed)
CARE MANAGEMENT NOTE 01/18/2012  Patient:  [name], [name]   Account Number:  [id number]  Date Initiated:  01/17/2012  Documentation initiated by:  PEELE,SUZANNE  Subjective/Objective Assessment:   41 yo male admitted with bowel obstruction, to OR for resection 10/1. PTA lived at home with spouse.     Action/Plan:   Anticipated DC Date:  01/21/2012   Anticipated DC Plan:  HOME/SELF CARE      DC Planning Services  CM consult      Choice offered to / List presented to:             Status of service:  In process, will continue to follow Medicare Important Message given?   (If response is "NO", the following Medicare IM given date fields will be blank) Date Medicare IM given:   Date Additional Medicare IM given:    Discharge Disposition:    Per UR Regulation:  Reviewed for med. necessity/level of care/duration of stay  If discussed at Long Length of Stay Meetings, dates discussed:    Comments:  [id number]/Rhonda Davis,RN,BSN,CCM: Patient taken to or on [id number] for abd pain-obstruction versus perforation, colectomy with colostomy required, post op patient became hypotensive requiring iv fld blous transferred to icu for monitoring.

## 2012-01-18 NOTE — Progress Notes (Signed)
Called for tachypnea and hypoxia.  HR 137, RR 39, SBP 128, sats 91% on partial NRB.  He has wheezing bilat. CXR with poor expansion and concern for pulmonary edema.  ABG 7.33/46/60/24  I am concerned that he may need reintubation for such poor oxygenation.  I have spoken with the CCM and he will consult on the patient.  He has already ordered lasix.  Will see his response to this but again, I am concerned that he may require reintubation tonight.

## 2012-01-18 NOTE — Procedures (Signed)
Name:  GROVE DEFINA MRN:  [id number] DOB:  1970/05/04  PROCEDURE NOTE  Procedure:  Central venous catheter placement.  Indications:  Need for intravenous access and hemodynamic monitoring.  Consent:  Consent was implied due to the emergency nature of the procedure.  Anesthesia:  A total of 10 mL of 1% Lidocaine was used for local infiltration anesthesia.  Procedure summary:  Appropriate equipment was assembled.  The patient was identified as [name] and safety timeout was performed. The patient was placed in Trendelenburg position.  Sterile technique was used. The patient's left anterior chest wall was prepped using chlorhexidine / alcohol scrub and the field was draped in usual sterile fashion with full body drape. After the adequate anesthesia was achieved, the left subclavian vein was cannulated with the introducer needle without difficulty. A guide wire was advanced through the introducer needle, which was then withdrawn. A small skin incision was made at the point of wire entry, the dilator was inserted over the guide wire and appropriate dilation was obtained. The dilator was removed and triple-lumen catheter was advanced over the guide wire, which was then removed.  All ports were aspirated and flushed with normal saline without difficulty. The catheter was secured into place. Antibiotic patch was placed and sterile dressing was applied. Post-procedure chest x-ray was ordered.  Complications:  No immediate complications were noted.  Hemodynamic parameters and oxygenation remained stable throughout the procedure.  Estimated blood loss:  Less then 5 mL.  Orlean Bradford, M.D. Pulmonary and Critical Care Medicine [facility] Cell: [phone]  01/18/2012, 10:51 PM

## 2012-01-18 NOTE — Progress Notes (Signed)
First wound vac dressing change will be Wednesday 10/3. With Ladona Mow RN and Zola Button, [place].

## 2012-01-18 NOTE — Progress Notes (Signed)
eLink Physician-Brief Progress Note Patient Name: [name] DOB: [birth year] MRN: [id number]  Date of Service  01/18/2012   HPI/Events of Note   Respiratory failure. Pulmonary edema   eICU Interventions  Bipap, check labs, lasix. May yet need vent. Full PCCM note to follow   Intervention Category Major Interventions: Respiratory failure - evaluation and management  Shan Levans 01/18/2012, 7:46 PM

## 2012-01-19 ENCOUNTER — Inpatient Hospital Stay (HOSPITAL_COMMUNITY): Payer: Medicaid Other

## 2012-01-19 DIAGNOSIS — K5732 Diverticulitis of large intestine without perforation or abscess without bleeding: Secondary | ICD-10-CM

## 2012-01-19 LAB — BLOOD GAS, ARTERIAL
Acid-Base Excess: 3.5 mmol/L — ABNORMAL HIGH (ref 0.0–2.0)
Bicarbonate: 28.2 mEq/L — ABNORMAL HIGH (ref 20.0–24.0)
FIO2: 0.6 %
O2 Saturation: 98 %
TCO2: 24.8 mmol/L (ref 0–100)
pO2, Arterial: 119 mmHg — ABNORMAL HIGH (ref 80.0–100.0)

## 2012-01-19 LAB — TROPONIN I
Troponin I: <0.3 ng/mL (ref <0.30)
Troponin I: <0.3 ng/mL (ref <0.30)

## 2012-01-19 LAB — GLUCOSE, CAPILLARY: Glucose-Capillary: 142 mg/dL — ABNORMAL HIGH (ref 70–99)

## 2012-01-19 MED ORDER — MIDAZOLAM HCL 5 MG/ML IJ SOLN: 2.0000 mg | INTRAMUSCULAR | Status: DC | PRN

## 2012-01-19 MED ORDER — FENTANYL CITRATE 0.05 MG/ML IJ SOLN: 25.0000 ug | INTRAMUSCULAR | Status: DC | PRN

## 2012-01-19 MED ORDER — BIOTENE DRY MOUTH MT LIQD
15.0000 mL | Freq: Four times a day (QID) | OROMUCOSAL | Status: DC
  Administered 2012-01-19 – 2012-01-28 (×33): 15 mL via OROMUCOSAL

## 2012-01-19 MED ORDER — ACETAMINOPHEN 10 MG/ML IV SOLN
1000.0000 mg | Freq: Four times a day (QID) | INTRAVENOUS | Status: AC
  Administered 2012-01-19 – 2012-01-20 (×4): 1000 mg via INTRAVENOUS
  Filled 2012-01-19 (×5): qty 100

## 2012-01-19 MED ORDER — CHLORHEXIDINE GLUCONATE 0.12 % MT SOLN
15.0000 mL | Freq: Two times a day (BID) | OROMUCOSAL | Status: DC
  Administered 2012-01-19 – 2012-02-01 (×27): 15 mL via OROMUCOSAL
  Filled 2012-01-19 (×31): qty 15

## 2012-01-19 MED ORDER — DIPHENHYDRAMINE HCL 25 MG PO CAPS
ORAL_CAPSULE | ORAL | Status: AC
  Filled 2012-01-19: qty 1

## 2012-01-19 NOTE — Progress Notes (Signed)
Name: [name] MRN: [id number] DOB: Nov 09, 1970    LOS: 3  Referring Provider:  CCS Reason for Referral:  Acute respiratory failure  PULMONARY / CRITICAL CARE MEDICINE   Brief patient description:  41 yo morbidly obese male with suspected OSA/OHS admitted 9/30 with abdominal pain, nausea and vomiting s/p exploratory laparotomy with partial colectomy and colostomy (10/1).  The post op course has been complicated by hypoxemic respiratory failure requiring intubation.  Events Since Admission: 9/30  Admitted with abdominal pain, nausea and vomiting 10/1  Exploratory laparotomy, partial colectomy, colostomy; peritoneal abscess noted 10/2  Intubated for hypoxemic respiratory failure  Current Status:  Vital Signs: Temp:  [99.6 F (37.6 C)-100.8 F (38.2 C)] 100.6 F (38.1 C) (10/03 0400) Pulse Rate:  [121-136] 126  (10/03 0813) Resp:  [19-39] 26  (10/03 0813) BP: (107-146)/(58-97) 111/58 mmHg (10/03 0813) SpO2:  [86 %-100 %] 100 % (10/03 0813) FiO2 (%):  [80 %-100 %] 80 % (10/03 0814) Weight:  [137.4 kg (302 lb 14.6 oz)] 137.4 kg (302 lb 14.6 oz) (10/03 0536)  Physical Examination:  Gen: obese, sedated on vent, comfortable HEENT: NCAT, PERRL, ETT in place PULM: Few crackles left base CV: RRR, no mgr AB: BS+ (infrequent), wound vac and colostomy in place Ext: warm, scds, trace edema Neuro: Sedated on vent  Principal Problem:  *Colon obstruction Active Problems:  Obesity, Class III, BMI 40-49.9 (morbid obesity)  Acute respiratory failure with hypoxia  Acute pulmonary edema  Encephalopathy acute  ASSESSMENT AND PLAN  PULMONARY  Lab 01/18/12 2227 01/18/12 1924  PHART 7.335* 7.321*  PCO2ART 48.4* 48.1*  PO2ART 70.5* 63.8*  HCO3 24.8* 23.9  O2SAT 92.0 89.0   Ventilator Settings: Vent Mode:  [-] PRVC FiO2 (%):  [80 %-100 %] 80 % Set Rate:  [14 bmp] 14 bmp Vt Set:  [580 mL] 580 mL PEEP:  [8 cmH20-10 cmH20] 10 cmH20 Plateau Pressure:  [17 cmH20-25 cmH20] 17  cmH20  CXR:  10/2 >>>  Hypoinflation, bilateral airspace disease consistent with pulmonary edema ETT:  10/2 >>>  A:   Acute hypoxemic / hypercarbic respiratory failure secondary to acute pulmonary edema in setting of aggressive fluid resuscitation (~ 6L positive) and decreased FRC due to abdominal distension after exploratory lap.    Atelectasis.  Suspected OSA/OHS.  P:   Goal SpO2 > 92, pH > 7.30 Full mechanical support, wean FiO2 today Advance ETT 3cm today PEEP 10 (increase FRC, improve chest compliance in setting of morbid obesity) Daily CXR/ABG Daily SBT Combivent Continue diuresis  CARDIOVASCULAR  Lab 01/19/12 0503 01/19/12 0441 01/18/12 2322 01/18/12 2317 01/18/12 2006 01/18/12 2005  TROPONINI <0.30 -- <0.30 -- -- --  LATICACIDVEN -- 2.1 -- 2.2 2.3* --  PROBNP -- -- -- -- -- 364.1*   ECG:  Pending Lines: R [place] TLC 10/2 >>>  A: Hemodynamically stable.   Ruled out for MI P:  Goal MAP > 60 12 lead ECG Trend lactic acid Trend troponin Diurese today for pulm edema  RENAL  Lab 01/18/12 2322 01/18/12 01/17/12 0353 01/16/12 1210  NA 141 135 136 133*  K 4.6 4.8 -- --  CL 107 102 100 98  CO2 26 23 25 22   BUN 15 11 12 9   CREATININE 1.01 0.71 0.59 0.70  CALCIUM 8.0* 8.0* 8.8 9.7  MG -- -- -- --  PHOS -- -- -- --   Intake/Output      10/02 0701 - 10/03 0700 10/03 0701 - 10/04 0700   I.V. (mL/kg)  1800.4 (13.1) 46.3 (0.3)   IV Piggyback 1173 12.5   Total Intake(mL/kg) 2973.4 (21.6) 58.8 (0.4)   Urine (mL/kg/hr) 3450 (1) 150   Emesis/NG output 400    Drains 5    Blood     Total Output 3855 150   Net -881.6 -91.2         Foley:  10/1 >>>  A:  Clinically volume overloaded.  Normal renal function. P:   IVF to [facility] Goal CVP 3-4 Lasix 40 q6h x 24 hrs Trend BMP  GASTROINTESTINAL  Lab 01/18/12 2322 01/18/12 01/17/12 0353 01/16/12 1210  AST 22 15 15 15   ALT 14 15 18 25   ALKPHOS 66 73 102 120*  BILITOT 0.7 0.7 0.4 0.5  PROT 5.2* 5.5* 6.8 7.7  ALBUMIN  1.9* 2.2* 2.8* 3.3*   A:  Status post exploratory lap, partial colectomy, colostomy. P:   NPO NGT to Sx Per CCS  HEMATOLOGIC  Lab 01/18/12 2322 01/18/12 01/17/12 0353 01/16/12 1210  HGB 14.7 17.8* 15.5 15.9  HCT 44.7 51.4 45.7 47.3  PLT 421* 528* 496* 569*  INR -- -- -- --  APTT -- -- -- --   A:  Hemoconcentration?  Reactive thrombocytosis. P:  Trend CBC  INFECTIOUS  Lab 01/19/12 0503 01/18/12 2322 01/18/12 2005 01/18/12 01/17/12 0353 01/16/12 1210  WBC -- 16.7* -- 7.6 7.4 14.4*  PROCALCITON 2.37 -- 2.43 -- -- --   Cultures: NA Antibiotics: Zosyn 9/30 >>>  A:  Peritonitis, diverticular abscess P:   Antibiotics / cultures as above  ENDOCRINE  Lab 01/18/12 2011  GLUCAP 135*   A:  Hyperglycemia. P:   Goal CBG 120 to 180 SSI  NEUROLOGIC  A:  Acute encephalopathy secondary to sedation.  Post op pain. P:   Goal RASS 0 to -1 Daily WUA Fentanyl / Versed gtt D/c PCA as intubated  BEST PRACTICE / DISPOSITION Level of Care:  ICU Primary Service:  CCS Consultants:  PCCM Code Status:  Full Diet:  NPO DVT Px:  Protonix GI Px:  Lovenox Skin Integrity:  Intact Social / Family:  Updated family at bedside  CC time 46  Yolonda Kida PCCM Pager: [phone] Cell: [phone] If no response, call [phone]   01/19/2012, 9:22 AM

## 2012-01-19 NOTE — Progress Notes (Signed)
2 Days Post-Op  Subjective: Intubated due to hypoxemia poor mechanics  Objective: Vital signs in last 24 hours: Temp:  [99.6 F (37.6 C)-100.8 F (38.2 C)] 100.6 F (38.1 C) (10/03 0400) Pulse Rate:  [121-136] 122  (10/03 1106) Resp:  [19-39] 20  (10/03 1106) BP: (96-146)/(39-97) 122/59 mmHg (10/03 1106) SpO2:  [86 %-100 %] 98 % (10/03 1107) FiO2 (%):  [60 %-100 %] 60 % (10/03 1107) Weight:  [302 lb 14.6 oz (137.4 kg)] 302 lb 14.6 oz (137.4 kg) (10/03 0536)    Intake/Output from previous day: 10/02 0701 - 10/03 0700 In: 2973.4 [I.V.:1800.4; IV Piggyback:1173] Out: 3855 [Urine:3450; Emesis/NG output:400; Drains:5] Intake/Output this shift: Total I/O In: 160.9 [I.V.:135.9; IV Piggyback:25] Out: 315 [Urine:315]  Incision/Wound:ostomy viable.  Patent below fascia vac in place  Lab Results:   Basename 01/18/12 2322 01/18/12  WBC 16.7* 7.6  HGB 14.7 17.8*  HCT 44.7 51.4  PLT 421* 528*   BMET  Basename 01/18/12 2322 01/18/12  NA 141 135  K 4.6 4.8  CL 107 102  CO2 26 23  GLUCOSE 137* 211*  BUN 15 11  CREATININE 1.01 0.71  CALCIUM 8.0* 8.0*   PT/INR No results found for this basename: LABPROT:2,INR:2 in the last 72 hours ABG  Basename 01/19/12 1055 01/18/12 2227  PHART 7.383 7.335*  HCO3 28.2* 24.8*    Studies/Results: Dg Chest Port 1 View  01/19/2012  *RADIOLOGY REPORT*  Clinical Data: Evaluate lungs  PORTABLE CHEST - 1 VIEW  Comparison: 01/18/2012  Findings: Endotracheal tube tip 7 cm proximal to the carina, at the thoracic inlet.  NG tube descends into the abdomen, tip not visualized.  Left subclavian central venous catheter tip projects over the mid SVC.  Cardiomegaly.  Central vascular congestion. Bilateral airspace opacities are similar.  Elevated hemidiaphragms obscures the lung bases.  No definite pleural effusion.  No pneumothorax.  No acute osseous finding.  IMPRESSION: Endotracheal tube tip at the thoracic inlet.  Bilateral airspace opacities are  similar.   Original Report Authenticated By: Waneta Martins, M.D.    Dg Chest Port 1 View  01/18/2012  *RADIOLOGY REPORT*  Clinical Data: Post central line placement  PORTABLE CHEST - 1 VIEW  Comparison: 01/18/2012  Findings: Endotracheal tube tip is located 9 cm proximal to the carina.  Left subclavian central venous catheter tip projects over the mid SVC.  NG tube descends below the level of the image. Prominent cardiomediastinal contours and bilateral interstitial and airspace opacities, similar to prior.  No definite pleural effusion or pneumothorax.  No acute osseous finding.  IMPRESSION: Endotracheal tube tip 9 cm proximal to the carina.  Recommend 2-3 cm of advancement.  Interval left subclavian central venous catheter placement, projects over an appropriate position.  No pneumothorax.  Bilateral airspace opacities are similar to prior; edema (favored) versus infection.   Original Report Authenticated By: Waneta Martins, M.D.    Dg Chest Port 1 View  01/18/2012  *RADIOLOGY REPORT*  Clinical Data: Respiratory failure.  Weakness.  Shortness of breath.  PORTABLE CHEST - 1 VIEW  Comparison: None.  Findings: 1915 hours.  Lung volumes are low.  The patient is rotated to the right.  There is diffuse bilateral alveolar opacity. The cardiopericardial silhouette is enlarged. The NG tube passes into the stomach although the distal tip position is not included on the film. Telemetry leads overlie the chest.  IMPRESSION: Markedly low lung volumes on this rotated film.  Cardiomegaly with diffuse bilateral airspace disease.  Imaging  features are probably related to pulmonary edema although diffuse infection could have this appearance.   Original Report Authenticated By: ERIC A. MANSELL, M.D.     Anti-infectives: Anti-infectives     Start     Dose/Rate Route Frequency Ordered Stop   01/16/12 1800   piperacillin-tazobactam (ZOSYN) IVPB 3.375 g        3.375 g 12.5 mL/hr over 240 Minutes Intravenous 3  times per day 01/16/12 1721            Assessment/Plan: s/p Procedure(s) (LRB) with comments: PARTIAL COLECTOMY (N/A) COLOSTOMY (N/A) VDRF  Per CCM Fever  Cultures pending  Wound ostomy viable and patent OSA Morbid obesity Path pending  Continue supportive care Vac change friday  LOS: 3 days    Atzin Buchta A. 01/19/2012

## 2012-01-19 NOTE — Progress Notes (Signed)
RT Note: ET tube 22@ lip, advanced 3cm to 25 per MD order. PT tolerated well.

## 2012-01-20 ENCOUNTER — Inpatient Hospital Stay (HOSPITAL_COMMUNITY): Payer: Medicaid Other

## 2012-01-20 LAB — CBC WITH DIFFERENTIAL/PLATELET
Basophils Absolute: 0 10*3/uL (ref 0.0–0.1)
Eosinophils Relative: 0 % (ref 0–5)
HCT: 38.3 % — ABNORMAL LOW (ref 39.0–52.0)
Hemoglobin: 12.3 g/dL — ABNORMAL LOW (ref 13.0–17.0)
Lymphocytes Relative: 14 % (ref 12–46)
Lymphs Abs: 1.2 10*3/uL (ref 0.7–4.0)
MCV: 95.3 fL (ref 78.0–100.0)
Monocytes Absolute: 0.7 10*3/uL (ref 0.1–1.0)
Monocytes Relative: 8 % (ref 3–12)
Neutro Abs: 6.4 10*3/uL (ref 1.7–7.7)
RDW: 13.2 % (ref 11.5–15.5)
WBC: 8.3 10*3/uL (ref 4.0–10.5)

## 2012-01-20 LAB — GLUCOSE, CAPILLARY
Glucose-Capillary: 117 mg/dL — ABNORMAL HIGH (ref 70–99)
Glucose-Capillary: 123 mg/dL — ABNORMAL HIGH (ref 70–99)
Glucose-Capillary: 109 mg/dL — ABNORMAL HIGH (ref 70–99)
Glucose-Capillary: 85 mg/dL (ref 70–99)
Glucose-Capillary: 99 mg/dL (ref 70–99)

## 2012-01-20 LAB — BASIC METABOLIC PANEL
Chloride: 106 mEq/L (ref 96–112)
Creatinine, Ser: 1.11 mg/dL (ref 0.50–1.35)
GFR calc Af Amer: >90 mL/min (ref >90)
GFR calc non Af Amer: 82 mL/min — ABNORMAL LOW (ref >90)
Potassium: 3.6 mEq/L (ref 3.5–5.1)

## 2012-01-20 LAB — BLOOD GAS, ARTERIAL
Acid-Base Excess: 6.6 mmol/L — ABNORMAL HIGH (ref 0.0–2.0)
Bicarbonate: 31.9 mEq/L — ABNORMAL HIGH (ref 20.0–24.0)
FIO2: 0.5 %
MECHVT: 580 mL
TCO2: 28.4 mmol/L (ref 0–100)
pCO2 arterial: 52.3 mmHg — ABNORMAL HIGH (ref 35.0–45.0)
pH, Arterial: 7.407 (ref 7.350–7.450)

## 2012-01-20 LAB — PROCALCITONIN: Procalcitonin: 1.54 ng/mL

## 2012-01-20 MED ORDER — POTASSIUM CHLORIDE 10 MEQ/50ML IV SOLN
10.0000 meq | INTRAVENOUS | Status: AC
  Administered 2012-01-20 (×3): 10 meq via INTRAVENOUS
  Filled 2012-01-20 (×3): qty 50
  Filled 2012-01-20: qty 100
  Filled 2012-01-20: qty 50

## 2012-01-20 MED ORDER — POTASSIUM CHLORIDE 10 MEQ/100ML IV SOLN
10.0000 meq | INTRAVENOUS | Status: AC
  Administered 2012-01-20 (×4): 10 meq via INTRAVENOUS
  Filled 2012-01-20 (×4): qty 100

## 2012-01-20 MED ORDER — FUROSEMIDE 10 MG/ML IJ SOLN
40.0000 mg | Freq: Three times a day (TID) | INTRAMUSCULAR | Status: DC
  Administered 2012-01-20 – 2012-01-21 (×4): 40 mg via INTRAVENOUS
  Filled 2012-01-20 (×7): qty 4

## 2012-01-20 NOTE — Progress Notes (Signed)
Nutrition Follow-up  Intervention:  1. If patient is to remain intubated, recommend initiate TF of Jevity 1.2 @  10 ml/hr and increase by 10 ml every 4 hours to a goal rate of 40 ml/hr plus prostat TID. Provides 1452 kcal, 98 grams protein and 775 ml free water. (Meets 97% of estimated kcal needs per ASPEN guidelines and 100% of estimated protein needs.) If patient is to be on bowel rest for colon obstruction and unable to use the gut, recommend TNA for nutrition support.   Assessment:  Patient intubated on mechanical ventilation, no sedation. Per MD trying to wean patient off ventilation today. Patient still has wound VAC. Per MD, pt with no bowel sounds, nothing in ostomy.  MVe: 12.6 TMax: 37.6  Diet Order:  NPO  Meds: Scheduled Meds:   . acetaminophen  1,000 mg Intravenous Q6H  . albuterol-ipratropium  6 puff Inhalation Q4H  . antiseptic oral rinse  15 mL Mouth Rinse QID  . chlorhexidine  15 mL Mouth Rinse BID  . diphenhydrAMINE      . enoxaparin  40 mg Subcutaneous Q24H  . furosemide  40 mg Intravenous Q6H  . furosemide  40 mg Intravenous Q8H  . insulin aspart  0-15 Units Subcutaneous Q4H  . pantoprazole (PROTONIX) IV  40 mg Intravenous QHS  . piperacillin-tazobactam (ZOSYN)  IV  3.375 g Intravenous Q8H  . potassium chloride  10 mEq Intravenous Q1 Hr x 4  . potassium chloride  10 mEq Intravenous Q1 Hr x 4   Continuous Infusions:   . dextrose 5 % and 0.9 % NaCl with KCl 20 mEq/L 1,000 mL (01/18/12 1901)  . fentaNYL infusion INTRAVENOUS Stopped (01/20/12 0945)  . midazolam (VERSED) infusion Stopped (01/20/12 0945)   PRN Meds:.albuterol-ipratropium, fentaNYL, midazolam, ondansetron, phenol  Labs:  CMP     Component Value Date/Time   NA 145 01/20/2012 0437   K 3.6 01/20/2012 0437   CL 106 01/20/2012 0437   CO2 32 01/20/2012 0437   GLUCOSE 106* 01/20/2012 0437   BUN 24* 01/20/2012 0437   CREATININE 1.11 01/20/2012 0437   CALCIUM 8.4 01/20/2012 0437   PROT 5.2* 01/18/2012 2322   ALBUMIN 1.9* 01/18/2012 2322   AST 22 01/18/2012 2322   ALT 14 01/18/2012 2322   ALKPHOS 66 01/18/2012 2322   BILITOT 0.7 01/18/2012 2322   GFRNONAA 82* 01/20/2012 0437   GFRAA >90 01/20/2012 0437     Intake/Output Summary (Last 24 hours) at 01/20/12 1119 Last data filed at 01/20/12 1016  Gross per 24 hour  Intake 1151.88 ml  Output   4120 ml  Net -2968.12 ml    Weight Status:  302 lb.  Re-estimated needs remain the same:  2491 kcal and 90-115 grams protein Enteral nutrition to provide 60-70% of estimated calorie needs (22-25 kcals/kg ideal body weight) and >/= 90% of estimated protein needs, based on ASPEN guidelines for permissive underfeeding in critically ill obese individuals.   Nutrition Dx:  Inadequate Oral Intake, Ongoing.   Goal:  1. Meet > 90% of estimated energy needs.   Monitor:  Extubation, diet advancements, weights, labs   Tommaso, [place] [phone]

## 2012-01-20 NOTE — Progress Notes (Signed)
Ostomy flushed and he is weaning well.  Path diverticulitis.

## 2012-01-20 NOTE — Progress Notes (Signed)
Name: [name] MRN: [id number] DOB: 1970-07-19    LOS: 4  Referring Provider:  CCS Reason for Referral:  Acute respiratory failure  PULMONARY / CRITICAL CARE MEDICINE   Brief patient description:  41 yo morbidly obese male with suspected OSA/OHS admitted 9/30 with abdominal pain, nausea and vomiting s/p exploratory laparotomy with partial colectomy and colostomy (10/1).  The post op course has been complicated by hypoxemic respiratory failure requiring intubation.  Events Since Admission: 9/30  Admitted with abdominal pain, nausea and vomiting 10/1  Exploratory laparotomy, partial colectomy, colostomy; peritoneal abscess noted 10/2  Intubated for hypoxemic respiratory failure  Current Status: Sedated, currently on SBT Vital Signs: Temp:  [99.8 F (37.7 C)-101.9 F (38.8 C)] 100.6 F (38.1 C) (10/04 0400) Pulse Rate:  [103-127] 110  (10/04 0600) Resp:  [16-22] 16  (10/04 0700) BP: (96-130)/(39-65) 111/56 mmHg (10/04 0700) SpO2:  [97 %-100 %] 98 % (10/04 0700) FiO2 (%):  [40 %-60 %] 40 % (10/04 0700)  Physical Examination:  Gen: obese, sedated on vent, comfortable HEENT: NCAT, PERRL, ETT in place PULM: scattered rhonchi CV: RRR, no mgr AB: BS+ (infrequent), wound vac and colostomy in place Ext: warm, scds, trace edema Neuro: Sedated on vent  Principal Problem:  *Colon obstruction Active Problems:  Obesity, Class III, BMI 40-49.9 (morbid obesity)  Acute respiratory failure with hypoxia  Acute pulmonary edema  Encephalopathy acute  ASSESSMENT AND PLAN  PULMONARY  Lab 01/20/12 0422 01/19/12 1055 01/18/12 2227 01/18/12 1924  PHART 7.407 7.383 7.335* 7.321*  PCO2ART 52.3* 49.7* 48.4* 48.1*  PO2ART 105.0* 119.0* 70.5* 63.8*  HCO3 31.9* 28.2* 24.8* 23.9  O2SAT 97.6 98.0 92.0 89.0   Ventilator Settings: Vent Mode:  [-] PRVC FiO2 (%):  [40 %-60 %] 40 % Set Rate:  [14 bmp] 14 bmp Vt Set:  [580 mL] 580 mL PEEP:  [8 cmH20-10 cmH20] 8 cmH20 Plateau Pressure:   [23 cmH20-28 cmH20] 23 cmH20  CXR:  10/4>>>  Hypoinflation, bilateral airspace disease consistent with pulmonary edema, no interval improvement, in spite of aggressive diuresis  ETT:  10/2 >>>  A:   1) Acute hypoxemic / hypercarbic respiratory failure secondary to acute pulmonary edema in setting of aggressive fluid resuscitation (~ 6L positive) and decreased FRC due to abdominal distension after exploratory lap. Given little change in x-ray would add acute lung injury to the differential 2) Atelectasis. 3) Suspected OSA/OHS. Almost 3 L negative on 10/3 chest x-ray still demonstrating bilateral airspace disease without significant improvement. His ventilator mechanics look much better than his x-ray.  P:   Daily assessment for weaning Daily CXR/ABG Daily SBT Combivent Continue diuresis  CARDIOVASCULAR  Lab 01/19/12 1850 01/19/12 1035 01/19/12 0503 01/19/12 0441 01/18/12 2322 01/18/12 2317 01/18/12 2006 01/18/12 2005  TROPONINI <0.30 <0.30 <0.30 -- <0.30 -- -- --  LATICACIDVEN -- 1.8 -- 2.1 -- 2.2 2.3* --  PROBNP -- -- -- -- -- -- -- 364.1*   ECG:  Pending Lines: R [place] TLC 10/2 >>>  A: Hemodynamically stable.   P:  Goal MAP > 60   RENAL  Lab 01/20/12 0437 01/18/12 2322 01/18/12 01/17/12 0353 01/16/12 1210  NA 145 141 135 136 133*  K 3.6 4.6 -- -- --  CL 106 107 102 100 98  CO2 32 26 23 25 22   BUN 24* 15 11 12 9   CREATININE 1.11 1.01 0.71 0.59 0.70  CALCIUM 8.4 8.0* 8.0* 8.8 9.7  MG -- -- -- -- --  PHOS -- -- -- -- --  Intake/Output      10/03 0701 - 10/04 0700 10/04 0701 - 10/05 0700   I.V. (mL/kg) 735.9 (5.4)    Other 100    IV Piggyback 443    Total Intake(mL/kg) 1278.9 (9.3)    Urine (mL/kg/hr) 3890 (1.2) 175   Emesis/NG output 240    Drains     Total Output 4130 175   Net -2851.1 -175         Foley:  10/1 >>>  A:  Clinically volume overloaded.  Normal renal function. Received Lasix on 10/3. (see pulm section) P:   IVF to [facility] Goal CVP 3-4 Trend  BMP  GASTROINTESTINAL  Lab 01/18/12 2322 01/18/12 01/17/12 0353 01/16/12 1210  AST 22 15 15 15   ALT 14 15 18 25   ALKPHOS 66 73 102 120*  BILITOT 0.7 0.7 0.4 0.5  PROT 5.2* 5.5* 6.8 7.7  ALBUMIN 1.9* 2.2* 2.8* 3.3*   A:  Status post exploratory lap, partial colectomy, colostomy. P:   NPO, need to decide on nutrition NGT to Sx Per CCS  HEMATOLOGIC  Lab 01/20/12 0437 01/18/12 2322 01/18/12 01/17/12 0353 01/16/12 1210  HGB 12.3* 14.7 17.8* 15.5 15.9  HCT 38.3* 44.7 51.4 45.7 47.3  PLT 364 421* 528* 496* 569*  INR -- -- -- -- --  APTT -- -- -- -- --   A:  Hemoconcentration?  Reactive thrombocytosis. No evidence of bleeding, currently on enoxaparin. Did have 2 g hemoglobin drift over 48 hours. P:  Trend CBC CBC continues to drift may need to consider transition to SCDs  INFECTIOUS  Lab 01/20/12 0437 01/19/12 0503 01/18/12 2322 01/18/12 2005 01/18/12 01/17/12 0353 01/16/12 1210  WBC 8.3 -- 16.7* -- 7.6 7.4 14.4*  PROCALCITON 1.54 2.37 -- 2.43 -- -- --   Cultures: Blood culture x2 10/3>>>  Antibiotics: Zosyn 9/30 >>>  A:  Peritonitis, diverticular abscess P:   Antibiotics / cultures as above  ENDOCRINE  Lab 01/20/12 0351 01/19/12 2357 01/19/12 1959 01/19/12 1752 01/19/12 1307  GLUCAP 109* 123* 109* 117* 123*   A:  Hyperglycemia. Excellent glycemic control P:   Goal CBG 120 to 180 SSI  NEUROLOGIC  A:  Acute encephalopathy secondary to sedation.  Post op pain.  P:   Goal RASS 0  Daily WUA Fentanyl / Versed gtt D/c PCA as intubated  BEST PRACTICE / DISPOSITION Level of Care:  ICU Primary Service:  CCS Consultants:  PCCM Code Status:  Full Diet:  NPO DVT Px:  Protonix GI Px:  Lovenox Skin Integrity:  Intact Social / Family:  Updated family at bedside    BABCOCK,PETE    01/20/2012, 8:33 AM  Attending:  I have seen and examined the patient with nurse practitioner/resident and agree with the note above.   Much improved oxygenation this  morning.  CXR findings still abnormal, but looks great on PSV 5/5 40% this morning.  Will let him wake up and hopefully exbuate.  Continue diuresis.  CC time 35 minutes.  Yolonda Kida PCCM Pager: [phone] Cell: [phone] If no response, call [phone]

## 2012-01-20 NOTE — Progress Notes (Signed)
PARENTERAL NUTRITION CONSULT NOTE - INITIAL  Pharmacy Consult for TNA (to start 10/5pm) Indication: Massive Bowel Resection  No Known Allergies  Patient Measurements: Height: 5\' 10"  (177.8 cm) Weight: 302 lb 14.6 oz (137.4 kg) IBW/kg (Calculated) : 73  Adjusted Body Weight: 98.9 kg Usual Weight: 304 lbs in 12/2011  Vital Signs: Temp: 99.7 F (37.6 C) (10/04 1200) Temp src: Oral (10/04 1200) BP: 123/70 mmHg (10/04 1500) Pulse Rate: 106  (10/04 1500) Intake/Output from previous day: 10/03 0701 - 10/04 0700 In: 1278.9 [I.V.:735.9; IV Piggyback:443] Out: 4130 [Urine:3890; Emesis/NG output:240] Intake/Output from this shift: Total I/O In: 228.9 [I.V.:99.9; IV Piggyback:129] Out: 1340 [Urine:1340]  Labs:  [facility] 01/20/12 0437 01/18/12 2322 01/18/12  WBC 8.3 16.7* 7.6  HGB 12.3* 14.7 17.8*  HCT 38.3* 44.7 51.4  PLT 364 421* 528*  APTT -- -- --  INR -- -- --     Basename 01/20/12 0437 01/18/12 2322 01/18/12  NA 145 141 135  K 3.6 4.6 4.8  CL 106 107 102  CO2 32 26 23  GLUCOSE 106* 137* 211*  BUN 24* 15 11  CREATININE 1.11 1.01 0.71  LABCREA -- -- --  CREAT24HRUR -- -- --  CALCIUM 8.4 8.0* 8.0*  MG -- -- --  PHOS -- -- --  PROT -- 5.2* 5.5*  ALBUMIN -- 1.9* 2.2*  AST -- 22 15  ALT -- 14 15  ALKPHOS -- 66 73  BILITOT -- 0.7 0.7  BILIDIR -- -- --  IBILI -- -- --  PREALBUMIN -- -- --  TRIG -- -- --  CHOLHDL -- -- --  CHOL -- -- --   Estimated Creatinine Clearance: 123.6 ml/min (by C-G formula based on Cr of 1.11).    Basename 01/20/12 0351 01/19/12 2357 01/19/12 1959  GLUCAP 109* 123* 109*    Medical History: Past Medical History  Diagnosis Date  . Urinary anastomotic stricture undiagnosed     hard to be cathed; urology did the last time  . Diverticulitis   . Colon obstruction 01/17/2012  . Obesity, Class III, BMI 40-49.9 (morbid obesity) 01/17/2012    Medications:  Scheduled:    . acetaminophen  1,000 mg Intravenous Q6H  .  albuterol-ipratropium  6 puff Inhalation Q4H  . antiseptic oral rinse  15 mL Mouth Rinse QID  . chlorhexidine  15 mL Mouth Rinse BID  . diphenhydrAMINE      . enoxaparin  40 mg Subcutaneous Q24H  . furosemide  40 mg Intravenous Q6H  . furosemide  40 mg Intravenous Q8H  . insulin aspart  0-15 Units Subcutaneous Q4H  . pantoprazole (PROTONIX) IV  40 mg Intravenous QHS  . piperacillin-tazobactam (ZOSYN)  IV  3.375 g Intravenous Q8H  . potassium chloride  10 mEq Intravenous Q1 Hr x 4  . potassium chloride  10 mEq Intravenous Q1 Hr x 4   Infusions:    . dextrose 5 % and 0.9 % NaCl with KCl 20 mEq/L 1,000 mL (01/18/12 1901)  . fentaNYL infusion INTRAVENOUS 100 mcg/hr (01/20/12 1426)  . midazolam (VERSED) infusion 4 mg/hr (01/20/12 1542)    Insulin Requirements in the past 24 hours:  (none currently ordered)  Nutritional Goals: (per 10/1 and 10/4 RD Note) UUVO:5366 Protein:90-115g  Fluid:1.9-2.2L   (although MD note indicates pt is currenly fluid overloaded)  Goal TNA: Clinimix E 5/20 at 157ml/hr will provide 2400 ml/day, 120 g protein per day and an average of 2318 kcal/day (2592 kcal on lipid days, 2112 kcal on nonlipid  days)  Current Nutrition:  NPO (as of 9/30)  Assessment: 41 yo morbidly obese M admitted 9/30 with abdominal pain, nausea and vomiting, now s/p exploratory laparotomy with partial colectomy and colostomy (10/1). Currently intubated in the ICU due to hypoxemic resp failure secondary to pulmonary edema in setting of aggressive fluid resuscitation. CCM note indicates patient is volume overloaded. Per RD note, pt reports poor PO intake prior to admission, nausea, dry heaves, and lack of appetite. Pt reports 29 lb unintentional weight loss in the past month. TNA to begin 10/5 pm (due to late order entry past cutoff time) via triple lumen PICC.  Lytes: wnl Renal: wnl but slow trend up - monitor Hepatic: LFTs wnl CBGs: <150  Plan:  1) TNA to begin tomorrow 10/5 pm (exact  formula TBD based on labs, any status changes, etc) - careful rate advancement due to volume overload status 2) TNA labs in am - CMET, Mg, Phos, triglycerides, cholesterol (given morbid obesity and poor PO intake) - watch for refeeding syndrome 3) Continue q4h CBGs - no SSI coverage at this time  Darrol Angel, PharmD Pager: [phone] 01/20/2012,4:08 PM

## 2012-01-20 NOTE — Progress Notes (Signed)
Start TNA

## 2012-01-20 NOTE — Progress Notes (Addendum)
3 Days Post-Op  Subjective: Sedated on vent, but versed is off and they are going to try and wean vent.    Objective: Vital signs in last 24 hours: Temp:  [99.7 F (37.6 C)-101.9 F (38.8 C)] 99.7 F (37.6 C) (10/04 0800) Pulse Rate:  [103-127] 110  (10/04 0600) Resp:  [16-22] 16  (10/04 0700) BP: (107-130)/(45-65) 111/56 mmHg (10/04 0700) SpO2:  [97 %-99 %] 98 % (10/04 0700) FiO2 (%):  [40 %-60 %] 40 % (10/04 0857)  240/NG, NPO, TM 102.1 yesterday, better this AM. hemodynamiaclly stable,     Intake/Output from previous day: 10/03 0701 - 10/04 0700 In: 1278.9 [I.V.:735.9; IV Piggyback:443] Out: 4130 [Urine:3890; Emesis/NG output:240] Intake/Output this shift: Total I/O In: 84.9 [I.V.:59.9; IV Piggyback:25] Out: 305 [Urine:305]  General appearance: sedated on vent and not responsive now, he was earlier when off sedation. Resp: full vent, but weaning. GI: Wound vac in place, no bowel sounds, nothing in ostomy bag,  opaque ostomy bag so it's hard to see ostomy, but it looks ok as far as I can tell.  Lab Results:   Basename 01/20/12 0437 01/18/12 2322  WBC 8.3 16.7*  HGB 12.3* 14.7  HCT 38.3* 44.7  PLT 364 421*    BMET  Basename 01/20/12 0437 01/18/12 2322  NA 145 141  K 3.6 4.6  CL 106 107  CO2 32 26  GLUCOSE 106* 137*  BUN 24* 15  CREATININE 1.11 1.01  CALCIUM 8.4 8.0*   PT/INR No results found for this basename: LABPROT:2,INR:2 in the last 72 hours   Lab 01/18/12 2322 01/18/12 01/17/12 0353 01/16/12 1210  AST 22 15 15 15   ALT 14 15 18 25   ALKPHOS 66 73 102 120*  BILITOT 0.7 0.7 0.4 0.5  PROT 5.2* 5.5* 6.8 7.7  ALBUMIN 1.9* 2.2* 2.8* 3.3*     Lipase     Component Value Date/Time   LIPASE 24 01/16/2012 1210     Studies/Results: Dg Chest Port 1 View  01/20/2012  *RADIOLOGY REPORT*  Clinical Data: Evaluate endotracheal tube positioning  PORTABLE CHEST - 1 VIEW  Comparison: 01/19/2012; 01/18/2012;  Findings:  Interval advancement of endotracheal tube  with tip overlying tracheal air column, approximately 4.4 cm above the carina. Enteric tube terminates into the left hemidiaphragm.  Stable positioning of left subclavian vein approach central venous catheter with tip projected in the superior cavoatrial junction. No pneumothorax.  Unchanged cardiac silhouette and mediastinal contours. Lung volumes remain persistently reduced.  The pulmonary vasculature remains indistinct.  Bilateral heterogeneous air space opacities are grossly unchanged.  No new focal airspace opacities.  No definite pleural effusion.  Unchanged bones.  IMPRESSION: 1.  Interval advancement of endotracheal tube with tip now 4.4 cm above the carina.  Otherwise, stable position of support apparatus. No pneumothorax. 2.  Grossly unchanged bilateral air space opacities with differential considerations again including asymmetric alveolar pulmonary edema versus multifocal infection.   Original Report Authenticated By: Waynard Reeds, M.D.    Dg Chest Port 1 View  01/19/2012  *RADIOLOGY REPORT*  Clinical Data: Evaluate lungs  PORTABLE CHEST - 1 VIEW  Comparison: 01/18/2012  Findings: Endotracheal tube tip 7 cm proximal to the carina, at the thoracic inlet.  NG tube descends into the abdomen, tip not visualized.  Left subclavian central venous catheter tip projects over the mid SVC.  Cardiomegaly.  Central vascular congestion. Bilateral airspace opacities are similar.  Elevated hemidiaphragms obscures the lung bases.  No definite pleural effusion.  No pneumothorax.  No acute osseous finding.  IMPRESSION: Endotracheal tube tip at the thoracic inlet.  Bilateral airspace opacities are similar.   Original Report Authenticated By: Waneta Martins, M.D.    Dg Chest Port 1 View  01/18/2012  *RADIOLOGY REPORT*  Clinical Data: Post central line placement  PORTABLE CHEST - 1 VIEW  Comparison: 01/18/2012  Findings: Endotracheal tube tip is located 9 cm proximal to the carina.  Left subclavian central venous  catheter tip projects over the mid SVC.  NG tube descends below the level of the image. Prominent cardiomediastinal contours and bilateral interstitial and airspace opacities, similar to prior.  No definite pleural effusion or pneumothorax.  No acute osseous finding.  IMPRESSION: Endotracheal tube tip 9 cm proximal to the carina.  Recommend 2-3 cm of advancement.  Interval left subclavian central venous catheter placement, projects over an appropriate position.  No pneumothorax.  Bilateral airspace opacities are similar to prior; edema (favored) versus infection.   Original Report Authenticated By: Waneta Martins, M.D.    Dg Chest Port 1 View  01/18/2012  *RADIOLOGY REPORT*  Clinical Data: Respiratory failure.  Weakness.  Shortness of breath.  PORTABLE CHEST - 1 VIEW  Comparison: None.  Findings: 1915 hours.  Lung volumes are low.  The patient is rotated to the right.  There is diffuse bilateral alveolar opacity. The cardiopericardial silhouette is enlarged. The NG tube passes into the stomach although the distal tip position is not included on the film. Telemetry leads overlie the chest.  IMPRESSION: Markedly low lung volumes on this rotated film.  Cardiomegaly with diffuse bilateral airspace disease.  Imaging features are probably related to pulmonary edema although diffuse infection could have this appearance.   Original Report Authenticated By: ERIC A. MANSELL, M.D.     Medications:    . acetaminophen  1,000 mg Intravenous Q6H  . albuterol-ipratropium  6 puff Inhalation Q4H  . antiseptic oral rinse  15 mL Mouth Rinse QID  . chlorhexidine  15 mL Mouth Rinse BID  . diphenhydrAMINE      . enoxaparin  40 mg Subcutaneous Q24H  . furosemide  40 mg Intravenous Q6H  . furosemide  40 mg Intravenous Q8H  . insulin aspart  0-15 Units Subcutaneous Q4H  . pantoprazole (PROTONIX) IV  40 mg Intravenous QHS  . piperacillin-tazobactam (ZOSYN)  IV  3.375 g Intravenous Q8H  . potassium chloride  10 mEq  Intravenous Q1 Hr x 4  . 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 Luisa Hart Colon, segmental resection, descending - PERFORATION WITH PERICOLONIC ABSCESS, STENOSIS AND ACUTE SEROSITIS. - MUCOSAL PSEUDOPOLYPS. - FIVE BENIGN MESENTERIC LYMPH NODES. - NO EVIDENCE OF MALIGNANCY.  VDRF/acute lung injury Per CCM  Fever Cultures pending Wound ostomy viable and patent  OSA  BMI 43 Morbid obesity   Plan:  Vent per CCM, change dressing later today, continue NPO, NG, bowel rest.   LOS: 4 days  Wound vac changed, and it is clean and looks good , will continue wound vac Rx..1435 hrs.  Chase Arnall 01/20/2012

## 2012-01-20 NOTE — Consult Note (Signed)
WOC consult Note Reason for Consult: first post op dressing change, Will at bedside from CCS to see wound bed.  Wound type: surgical  Measurement: 28cm length, did not get width or depth at dressing today Wound bed: subcutaneous and adipose tissue, nothing necrotic Drainage (amount, consistency, odor) serosanguinous in canister Periwound: intact  Dressing procedure/placement/frequency: 1pc of black granufoam cut to fit into wound bed, drape and sealed at .  Pt sedated, intubated. Tolerated well.   WOC ostomy consult  Stoma type/location:  LLQ, end stoma Stomal assessment/size: 1 1/4" oval stoma, flush with skin Peristomal assessment: intact  Treatment options for stomal/peristomal skin: will need to add 2" barrier ring around stoma due to flushness Output none Ostomy pouching: /2pc placed per surgeon's request, do not think 2pc will work for pt long term but if need to observe stoma 2pc will work best for now until output begins.  May need to change over to 1pc flat with 2" barrier ring under to create gentle convexity around the stoma.   Education provided: demonstrated pouch change to the family at the bedside, however will further teaching once pt off vent and less sedated.   WOC team will follow along with you for continued assistance with [facility] and ostomy teaching. Mckennah Kretchmer [place], [place] [phone]

## 2012-01-21 ENCOUNTER — Inpatient Hospital Stay (HOSPITAL_COMMUNITY): Payer: Medicaid Other

## 2012-01-21 DIAGNOSIS — K651 Peritoneal abscess: Secondary | ICD-10-CM

## 2012-01-21 DIAGNOSIS — R000 Tachycardia, unspecified: Secondary | ICD-10-CM

## 2012-01-21 DIAGNOSIS — F17200 Nicotine dependence, unspecified, uncomplicated: Secondary | ICD-10-CM

## 2012-01-21 DIAGNOSIS — Z720 Tobacco use: Secondary | ICD-10-CM | POA: Diagnosis present

## 2012-01-21 DIAGNOSIS — I10 Essential (primary) hypertension: Secondary | ICD-10-CM

## 2012-01-21 HISTORY — DX: Peritoneal abscess: K65.1

## 2012-01-21 LAB — GLUCOSE, CAPILLARY
Glucose-Capillary: 130 mg/dL — ABNORMAL HIGH (ref 70–99)
Glucose-Capillary: 77 mg/dL (ref 70–99)
Glucose-Capillary: 88 mg/dL (ref 70–99)
Glucose-Capillary: 88 mg/dL (ref 70–99)
Glucose-Capillary: 89 mg/dL (ref 70–99)
Glucose-Capillary: 96 mg/dL (ref 70–99)

## 2012-01-21 LAB — BLOOD GAS, ARTERIAL
Drawn by: 244901
MECHVT: 580 mL
RATE: 14 resp/min
TCO2: 28.7 mmol/L (ref 0–100)
pCO2 arterial: 50 mmHg — ABNORMAL HIGH (ref 35.0–45.0)
pH, Arterial: 7.433 (ref 7.350–7.450)

## 2012-01-21 LAB — COMPREHENSIVE METABOLIC PANEL
ALT: 13 U/L (ref 0–53)
AST: 22 U/L (ref 0–37)
Calcium: 8.5 mg/dL (ref 8.4–10.5)
Sodium: 146 mEq/L — ABNORMAL HIGH (ref 135–145)
Total Protein: 6.2 g/dL (ref 6.0–8.3)

## 2012-01-21 LAB — CBC
HCT: 40 % (ref 39.0–52.0)
Hemoglobin: 12.9 g/dL — ABNORMAL LOW (ref 13.0–17.0)
MCH: 30.9 pg (ref 26.0–34.0)
MCHC: 32.3 g/dL (ref 30.0–36.0)
MCV: 95.7 fL (ref 78.0–100.0)
Platelets: 378 10*3/uL (ref 150–400)
RBC: 4.18 MIL/uL — ABNORMAL LOW (ref 4.22–5.81)
RDW: 13.6 % (ref 11.5–15.5)
WBC: 10.5 10*3/uL (ref 4.0–10.5)

## 2012-01-21 LAB — CHOLESTEROL, TOTAL: Cholesterol: 144 mg/dL (ref 0–200)

## 2012-01-21 LAB — PRO B NATRIURETIC PEPTIDE: Pro B Natriuretic peptide (BNP): 82.8 pg/mL (ref 0–125)

## 2012-01-21 LAB — TRIGLYCERIDES: Triglycerides: 422 mg/dL — ABNORMAL HIGH (ref <150)

## 2012-01-21 MED ORDER — NICOTINE 21 MG/24HR TD PT24
21.0000 mg | MEDICATED_PATCH | Freq: Every day | TRANSDERMAL | Status: DC
  Administered 2012-01-23: 21 mg via TRANSDERMAL
  Filled 2012-01-21 (×3): qty 1

## 2012-01-21 MED ORDER — FUROSEMIDE 10 MG/ML IJ SOLN
40.0000 mg | Freq: Four times a day (QID) | INTRAMUSCULAR | Status: AC
  Administered 2012-01-21 – 2012-01-22 (×3): 40 mg via INTRAVENOUS
  Filled 2012-01-21 (×3): qty 4

## 2012-01-21 MED ORDER — POTASSIUM CHLORIDE 10 MEQ/50ML IV SOLN
10.0000 meq | INTRAVENOUS | Status: AC
  Administered 2012-01-21 (×4): 10 meq via INTRAVENOUS
  Filled 2012-01-21: qty 200

## 2012-01-21 MED ORDER — METOPROLOL TARTRATE 1 MG/ML IV SOLN
5.0000 mg | Freq: Four times a day (QID) | INTRAVENOUS | Status: DC
  Administered 2012-01-21 – 2012-02-02 (×42): 5 mg via INTRAVENOUS
  Filled 2012-01-21 (×54): qty 5

## 2012-01-21 MED ORDER — CLINIMIX E/DEXTROSE (5/20) 5 % IV SOLN
INTRAVENOUS | Status: AC
  Administered 2012-01-21: 18:00:00 via INTRAVENOUS
  Filled 2012-01-21: qty 1000

## 2012-01-21 MED ORDER — ACETAMINOPHEN 650 MG RE SUPP: 650.0000 mg | Freq: Four times a day (QID) | RECTAL | Status: DC | PRN

## 2012-01-21 MED ORDER — MAGIC MOUTHWASH
15.0000 mL | Freq: Four times a day (QID) | ORAL | Status: DC | PRN
  Filled 2012-01-21: qty 15

## 2012-01-21 MED ORDER — PHENOL 1.4 % MT LIQD
2.0000 | OROMUCOSAL | Status: DC | PRN
  Filled 2012-01-21: qty 177

## 2012-01-21 MED ORDER — ACETAMINOPHEN 10 MG/ML IV SOLN
1000.0000 mg | Freq: Four times a day (QID) | INTRAVENOUS | Status: AC
  Administered 2012-01-21 – 2012-01-22 (×4): 1000 mg via INTRAVENOUS
  Filled 2012-01-21 (×5): qty 100

## 2012-01-21 MED ORDER — LIP MEDEX EX OINT
1.0000 "application " | TOPICAL_OINTMENT | Freq: Two times a day (BID) | CUTANEOUS | Status: DC
  Administered 2012-01-21 – 2012-01-22 (×4): 1 via TOPICAL
  Filled 2012-01-21: qty 7

## 2012-01-21 MED ORDER — ALUM & MAG HYDROXIDE-SIMETH 200-200-20 MG/5ML PO SUSP: 30.0000 mL | Freq: Four times a day (QID) | ORAL | Status: DC | PRN

## 2012-01-21 MED ORDER — MENTHOL 3 MG MT LOZG
1.0000 | LOZENGE | OROMUCOSAL | Status: DC | PRN
  Filled 2012-01-21: qty 9

## 2012-01-21 NOTE — Progress Notes (Signed)
PARENTERAL NUTRITION CONSULT NOTE - FOLLOW UP  Pharmacy Consult for TNA Indication: Bowel rest after resection/colostomy  No Known Allergies  Patient Measurements: Height: 5\' 10"  (177.8 cm) Weight: 302 lb 14.6 oz (137.4 kg) IBW/kg (Calculated) : 73  Adjusted Body Weight: 99kg Noted 40lb wt loss pre-op (remains obese)  Vital Signs: Temp: 102 F (38.9 C) (10/05 0400) Temp src: Oral (10/05 0400) BP: 122/79 mmHg (10/05 0600) Pulse Rate: 121  (10/05 0600) Intake/Output from previous day: 10/04 0701 - 10/05 0700 In: 841.6 [I.V.:450.1; IV Piggyback:391.5] Out: 4265 [Urine:4265] Intake/Output from this shift: Total I/O In: 356.5 [I.V.:244; IV Piggyback:112.5] Out: 1960 [Urine:1960]  Labs:  [facility] 01/21/12 0520 01/20/12 0437 01/18/12 2322  WBC 10.5 8.3 16.7*  HGB 12.9* 12.3* 14.7  HCT 40.0 38.3* 44.7  PLT 378 364 421*  APTT -- -- --  INR -- -- --     Basename 01/21/12 0520 01/20/12 0437 01/18/12 2322  NA 146* 145 141  K 3.8 3.6 4.6  CL 105 106 107  CO2 33* 32 26  GLUCOSE 92 106* 137*  BUN 25* 24* 15  CREATININE 1.01 1.11 1.01  LABCREA -- -- --  CREAT24HRUR -- -- --  CALCIUM 8.5 8.4 8.0*  MG 2.5 -- --  PHOS 3.1 -- --  PROT 6.2 -- 5.2*  ALBUMIN 1.9* -- 1.9*  AST 22 -- 22  ALT 13 -- 14  ALKPHOS 72 -- 66  BILITOT 0.4 -- 0.7  BILIDIR -- -- --  IBILI -- -- --  PREALBUMIN -- -- --  TRIG -- -- --  CHOLHDL -- -- --  CHOL -- -- --   Estimated Creatinine Clearance: 135.9 ml/min (by C-G formula based on Cr of 1.01).    Basename 01/21/12 0453 01/21/12 0031 01/20/12 1939  GLUCAP 88 77 85    Medications:  Scheduled:    . acetaminophen  1,000 mg Intravenous Q6H  . albuterol-ipratropium  6 puff Inhalation Q4H  . antiseptic oral rinse  15 mL Mouth Rinse QID  . chlorhexidine  15 mL Mouth Rinse BID  . enoxaparin  40 mg Subcutaneous Q24H  . furosemide  40 mg Intravenous Q8H  . insulin aspart  0-15 Units Subcutaneous Q4H  . pantoprazole (PROTONIX) IV  40 mg  Intravenous QHS  . piperacillin-tazobactam (ZOSYN)  IV  3.375 g Intravenous Q8H  . potassium chloride  10 mEq Intravenous Q1 Hr x 4  . potassium chloride  10 mEq Intravenous Q1 Hr x 4   Infusions:    . dextrose 5 % and 0.9 % NaCl with KCl 20 mEq/L 1,000 mL (01/18/12 1901)  . fentaNYL infusion INTRAVENOUS 200 mcg/hr (01/20/12 2000)  . midazolam (VERSED) infusion 4 mg/hr (01/21/12 0515)   Nutritional Goals:  60-70% of 2500 kCal (permissive underfeeding/obese), and  90-115 grams of protein per day  Clinimix E 5/20 at goal rate 90 ml/hr will deliver: 108 g protein/day; avg 2100 kcal/day (2380 on MWF, 1900 on T,Th,Sa,Su)  Current Nutrition:  NPO from 9/30  Insulin Requirements in the past 24 hours:  CBG 109->77 mg/dl SSI moderate q 4 hr.  Assessment: 41 yom s/p colectomy/colostomy 10/1 for bowel obstruction. BMI of 43 with noted weight loss prior to surgery. Remains intubated since surgery due to pulmonary edema with fluid resuscitation.    Electrolytes: Na 146, otherwise wnl  Renal: wnl  Hepatic: wnl  TG/Cholesterol  Fluid: D5NS with 20 mEq K/L at [facility], Furosemide 40mg  IV tid  Plan:   Begin Clinimix E 5/20 at  33ml/hr  Advance to goal rate as tolerated, monitor for re-feeding syndrome.  IV, rate per MD.  Lipids, MVI and Trace elements on MWF only due to national back order  Full TNA labs Mon,Thur  Otho Bellows PharmD Pager [phone] 01/21/2012,6:45 AM

## 2012-01-21 NOTE — Progress Notes (Signed)
[name] [id number] [birth year]   Subjective:  No major events Agitated off sedation Min vent settings Family & RN in room  Objective:  Vital signs:  Filed Vitals:   01/21/12 0200 01/21/12 0400 01/21/12 0413 01/21/12 0600  BP: 124/77 121/82  122/79  Pulse: 110 111 114 121  Temp:  102 F (38.9 C)    TempSrc:  Oral    Resp: 18 18 18 20   Height:      Weight:      SpO2: 95% 96% 96% 96%       Intake/Output   Yesterday:  10/04 0701 - 10/05 0700 In: 841.6 [I.V.:450.1; IV Piggyback:391.5] Out: 4265 [Urine:4265] This shift:  Total I/O In: 356.5 [I.V.:244; IV Piggyback:112.5] Out: 1960 [Urine:1960]  Bowel function:  Flatus: n  BM: n  Physical Exam:  General: Pt intubated in no acute distress Eyes: PERRL, normal EOM.  Sclera clear.  No icterus Neuro: CN II-XII intact w/o focal sensory/motor deficits. Lymph: No head/neck/groin lymphadenopathy Psych:  No delerium/psychosis/paranoia HENT: Normocephalic, Mucus membranes moist.  No thrush.  ETT in place Neck: Supple, No tracheal deviation Chest: No chest wall pain w good excursion CV:  Pulses intact.  Regular rhythm Abdomen: Soft.  Nondistended.  Wound vac in place.  No incarcerated hernias.  Ostomy no gas/stool Ext:  SCDs BLE.  No mjr edema.  No cyanosis Skin: No petechiae / purpurae  Problem List:  Principal Problem:  *Colon obstruction Active Problems:  UTI (lower urinary tract infection)  Diverticulitis of colon without hemorrhage  Obesity, Class III, BMI 40-49.9 (morbid obesity)  Acute respiratory failure with hypoxia  Acute pulmonary edema  Encephalopathy acute  Tobacco abuse  Intra-abdominal abscess, diverticular s/p surgical drainage   Assessment  [name]  41 y.o. male  4 Days Post-Op  Procedure(s): PARTIAL COLECTOMY COLOSTOMY  Stablizing but still on vent in smoking male requiring emergent colectomy/ostomy  Plan: -diuresis -wean vent as tolerated -pulmonary toilet -IV  ABx -nicotine patch for tobacco w/d -B blocker for tachy as tolerated- -VTE prophylaxis- SCDs, etc -mobilize as tolerated to help recovery  I discussed the patient's status to the family.  Questions were answered.  He expressed understanding & appreciation.   Ardeth Sportsman, M.D., F.A.C.S. Gastrointestinal and Minimally Invasive Surgery [facility] [address], [address] [place], [place] [address] [phone] Main / Paging [phone] Voice Mail   01/21/2012  CARE TEAM:  PCP: No primary provider on file.  Outpatient Care Team: Patient has no care team.  Inpatient Treatment Team: Treatment Team: Attending Provider: Md Montez Morita, MD; Registered Nurse: Bethann Goo, RN; Dietitian: Lavena Bullion, RD; Registered Nurse: Oda Kilts, RN; Rounding Team: Md Pccm, MD; Registered Nurse: Neomia Dear, RN; Technician: Koleen Distance, [place]; Registered Nurse: Gerald Stabs, RN; Registered Nurse: Lorrin Goodell, RN   Results:   Labs: Results for orders placed during the hospital encounter of 01/16/12 (from the past 48 hour(s))  GLUCOSE, CAPILLARY     Status: Abnormal   Collection Time   01/19/12  8:55 AM      Component Value Range Comment   Glucose-Capillary 142 (*) 70 - 99 mg/dL    Comment 1 Notify RN      Comment 2 Documented in Chart     LACTIC ACID, PLASMA     Status: Normal   Collection Time   01/19/12 10:35 AM      Component Value Range Comment   Lactic Acid,  Venous 1.8  0.5 - 2.2 mmol/L   TROPONIN I     Status: Normal   Collection Time   01/19/12 10:35 AM      Component Value Range Comment   Troponin I <0.30  <0.30 ng/mL   CULTURE, BLOOD (ROUTINE X 2)     Status: Normal (Preliminary result)   Collection Time   01/19/12 10:46 AM      Component Value Range Comment   Specimen Description BLOOD LEFT ARM      Special Requests BOTTLES DRAWN AEROBIC AND ANAEROBIC 10CC      Culture  Setup Time 01/19/2012 16:55      Culture         Value:        BLOOD CULTURE RECEIVED NO GROWTH TO DATE CULTURE WILL BE HELD FOR 5 DAYS BEFORE ISSUING A FINAL NEGATIVE REPORT   Report Status PENDING     CULTURE, BLOOD (ROUTINE X 2)     Status: Normal (Preliminary result)   Collection Time   01/19/12 10:48 AM      Component Value Range Comment   Specimen Description BLOOD LEFT ARM      Special Requests BOTTLES DRAWN AEROBIC AND ANAEROBIC 10CC      Culture  Setup Time 01/19/2012 16:55      Culture        Value:        BLOOD CULTURE RECEIVED NO GROWTH TO DATE CULTURE WILL BE HELD FOR 5 DAYS BEFORE ISSUING A FINAL NEGATIVE REPORT   Report Status PENDING     BLOOD GAS, ARTERIAL     Status: Abnormal   Collection Time   01/19/12 10:55 AM      Component Value Range Comment   FIO2 0.60      Delivery systems VENTILATOR      Mode PRESSURE REGULATED VOLUME CONTROL      VT 580      Rate 14      Peep/cpap 10.0      pH, Arterial 7.383  7.350 - 7.450    pCO2 arterial 49.7 (*) 35.0 - 45.0 mmHg    pO2, Arterial 119.0 (*) 80.0 - 100.0 mmHg    Bicarbonate 28.2 (*) 20.0 - 24.0 mEq/L    TCO2 24.8  0 - 100 mmol/L    Acid-Base Excess 3.5 (*) 0.0 - 2.0 mmol/L    O2 Saturation 98.0      Patient temperature 102.1      Collection site LEFT RADIAL      Drawn by [id number]      Sample type ARTERIAL DRAW      Allens test (pass/fail) PASS  PASS   GLUCOSE, CAPILLARY     Status: Abnormal   Collection Time   01/19/12  1:07 PM      Component Value Range Comment   Glucose-Capillary 123 (*) 70 - 99 mg/dL    Comment 1 Documented in Chart      Comment 2 Notify RN     GLUCOSE, CAPILLARY     Status: Abnormal   Collection Time   01/19/12  5:52 PM      Component Value Range Comment   Glucose-Capillary 117 (*) 70 - 99 mg/dL    Comment 1 Documented in Chart      Comment 2 Notify RN     TROPONIN I     Status: Normal   Collection Time   01/19/12  6:50 PM      Component Value Range Comment  Troponin I <0.30  <0.30 ng/mL   GLUCOSE, CAPILLARY     Status: Abnormal    Collection Time   01/19/12  7:59 PM      Component Value Range Comment   Glucose-Capillary 109 (*) 70 - 99 mg/dL   GLUCOSE, CAPILLARY     Status: Abnormal   Collection Time   01/19/12 11:57 PM      Component Value Range Comment   Glucose-Capillary 123 (*) 70 - 99 mg/dL    Comment 1 Documented in Chart      Comment 2 Notify RN     GLUCOSE, CAPILLARY     Status: Abnormal   Collection Time   01/20/12  3:51 AM      Component Value Range Comment   Glucose-Capillary 109 (*) 70 - 99 mg/dL    Comment 1 Documented in Chart      Comment 2 Notify RN     BLOOD GAS, ARTERIAL     Status: Abnormal   Collection Time   01/20/12  4:22 AM      Component Value Range Comment   FIO2 0.50      Delivery systems VENTILATOR      Mode PRESSURE REGULATED VOLUME CONTROL      VT 580      Rate 14      Peep/cpap 8.0      pH, Arterial 7.407  7.350 - 7.450    pCO2 arterial 52.3 (*) 35.0 - 45.0 mmHg    pO2, Arterial 105.0 (*) 80.0 - 100.0 mmHg    Bicarbonate 31.9 (*) 20.0 - 24.0 mEq/L    TCO2 28.4  0 - 100 mmol/L    Acid-Base Excess 6.6 (*) 0.0 - 2.0 mmol/L    O2 Saturation 97.6      Patient temperature 99.8      Collection site RIGHT RADIAL      Drawn by [phone]      Sample type ARTERIAL      Allens test (pass/fail) PASS  PASS   PROCALCITONIN     Status: Normal   Collection Time   01/20/12  4:37 AM      Component Value Range Comment   Procalcitonin 1.54     BASIC METABOLIC PANEL     Status: Abnormal   Collection Time   01/20/12  4:37 AM      Component Value Range Comment   Sodium 145  135 - 145 mEq/L    Potassium 3.6  3.5 - 5.1 mEq/L    Chloride 106  96 - 112 mEq/L    CO2 32  19 - 32 mEq/L    Glucose, Bld 106 (*) 70 - 99 mg/dL    BUN 24 (*) 6 - 23 mg/dL    Creatinine, Ser 0.45  0.50 - 1.35 mg/dL    Calcium 8.4  8.4 - 40.9 mg/dL    GFR calc non Af Amer 82 (*) >90 mL/min    GFR calc Af Amer >90  >90 mL/min   CBC WITH DIFFERENTIAL     Status: Abnormal   Collection Time   01/20/12  4:37 AM       Component Value Range Comment   WBC 8.3  4.0 - 10.5 K/uL    RBC 4.02 (*) 4.22 - 5.81 MIL/uL    Hemoglobin 12.3 (*) 13.0 - 17.0 g/dL    HCT 81.1 (*) 91.4 - 52.0 %    MCV 95.3  78.0 - 100.0 fL    MCH 30.6  26.0 - 34.0 pg    MCHC 32.1  30.0 - 36.0 g/dL    RDW 40.9  81.1 - 91.4 %    Platelets 364  150 - 400 K/uL    Neutrophils Relative 77  43 - 77 %    Neutro Abs 6.4  1.7 - 7.7 K/uL    Lymphocytes Relative 14  12 - 46 %    Lymphs Abs 1.2  0.7 - 4.0 K/uL    Monocytes Relative 8  3 - 12 %    Monocytes Absolute 0.7  0.1 - 1.0 K/uL    Eosinophils Relative 0  0 - 5 %    Eosinophils Absolute 0.0  0.0 - 0.7 K/uL    Basophils Relative 0  0 - 1 %    Basophils Absolute 0.0  0.0 - 0.1 K/uL   GLUCOSE, CAPILLARY     Status: Normal   Collection Time   01/20/12  8:00 AM      Component Value Range Comment   Glucose-Capillary 99  70 - 99 mg/dL   GLUCOSE, CAPILLARY     Status: Abnormal   Collection Time   01/20/12 12:18 PM      Component Value Range Comment   Glucose-Capillary 109 (*) 70 - 99 mg/dL    Comment 1 Documented in Chart      Comment 2 Notify RN     GLUCOSE, CAPILLARY     Status: Normal   Collection Time   01/20/12  4:47 PM      Component Value Range Comment   Glucose-Capillary 96  70 - 99 mg/dL   GLUCOSE, CAPILLARY     Status: Normal   Collection Time   01/20/12  7:39 PM      Component Value Range Comment   Glucose-Capillary 85  70 - 99 mg/dL   GLUCOSE, CAPILLARY     Status: Normal   Collection Time   01/21/12 12:31 AM      Component Value Range Comment   Glucose-Capillary 77  70 - 99 mg/dL    Comment 1 Documented in Chart      Comment 2 Notify RN     BLOOD GAS, ARTERIAL     Status: Abnormal   Collection Time   01/21/12  4:21 AM      Component Value Range Comment   FIO2 0.40      Delivery systems VENTILATOR      Mode PRESSURE REGULATED VOLUME CONTROL      VT 580      Rate 14      Peep/cpap 5.0      pH, Arterial 7.433  7.350 - 7.450    pCO2 arterial 50.0 (*) 35.0 - 45.0 mmHg     pO2, Arterial 82.1  80.0 - 100.0 mmHg    Bicarbonate 32.4 (*) 20.0 - 24.0 mEq/L    TCO2 28.7  0 - 100 mmol/L    Acid-Base Excess 7.6 (*) 0.0 - 2.0 mmol/L    O2 Saturation 95.3      Patient temperature 100.4      Collection site RIGHT RADIAL      Drawn by [phone]      Sample type ARTERIAL      Allens test (pass/fail) PASS  PASS   GLUCOSE, CAPILLARY     Status: Normal   Collection Time   01/21/12  4:53 AM      Component Value Range Comment   Glucose-Capillary 88  70 - 99 mg/dL  Comment 1 Notify RN      Comment 2 Documented in Chart     CBC     Status: Abnormal   Collection Time   01/21/12  5:20 AM      Component Value Range Comment   WBC 10.5  4.0 - 10.5 K/uL    RBC 4.18 (*) 4.22 - 5.81 MIL/uL    Hemoglobin 12.9 (*) 13.0 - 17.0 g/dL    HCT 16.1  09.6 - 04.5 %    MCV 95.7  78.0 - 100.0 fL    MCH 30.9  26.0 - 34.0 pg    MCHC 32.3  30.0 - 36.0 g/dL    RDW 40.9  81.1 - 91.4 %    Platelets 378  150 - 400 K/uL   PRO B NATRIURETIC PEPTIDE     Status: Normal   Collection Time   01/21/12  5:20 AM      Component Value Range Comment   Pro B Natriuretic peptide (BNP) 82.8  0 - 125 pg/mL   COMPREHENSIVE METABOLIC PANEL     Status: Abnormal   Collection Time   01/21/12  5:20 AM      Component Value Range Comment   Sodium 146 (*) 135 - 145 mEq/L    Potassium 3.8  3.5 - 5.1 mEq/L    Chloride 105  96 - 112 mEq/L    CO2 33 (*) 19 - 32 mEq/L    Glucose, Bld 92  70 - 99 mg/dL    BUN 25 (*) 6 - 23 mg/dL    Creatinine, Ser 7.82  0.50 - 1.35 mg/dL    Calcium 8.5  8.4 - 95.6 mg/dL    Total Protein 6.2  6.0 - 8.3 g/dL    Albumin 1.9 (*) 3.5 - 5.2 g/dL    AST 22  0 - 37 U/L    ALT 13  0 - 53 U/L    Alkaline Phosphatase 72  39 - 117 U/L    Total Bilirubin 0.4  0.3 - 1.2 mg/dL    GFR calc non Af Amer >90  >90 mL/min    GFR calc Af Amer >90  >90 mL/min   MAGNESIUM     Status: Normal   Collection Time   01/21/12  5:20 AM      Component Value Range Comment   Magnesium 2.5  1.5 - 2.5 mg/dL     PHOSPHORUS     Status: Normal   Collection Time   01/21/12  5:20 AM      Component Value Range Comment   Phosphorus 3.1  2.3 - 4.6 mg/dL     Imaging / Studies: Dg Chest Port 1 View  01/20/2012  *RADIOLOGY REPORT*  Clinical Data: Evaluate endotracheal tube positioning  PORTABLE CHEST - 1 VIEW  Comparison: 01/19/2012; 01/18/2012;  Findings:  Interval advancement of endotracheal tube with tip overlying tracheal air column, approximately 4.4 cm above the carina. Enteric tube terminates into the left hemidiaphragm.  Stable positioning of left subclavian vein approach central venous catheter with tip projected in the superior cavoatrial junction. No pneumothorax.  Unchanged cardiac silhouette and mediastinal contours. Lung volumes remain persistently reduced.  The pulmonary vasculature remains indistinct.  Bilateral heterogeneous air space opacities are grossly unchanged.  No new focal airspace opacities.  No definite pleural effusion.  Unchanged bones.  IMPRESSION: 1.  Interval advancement of endotracheal tube with tip now 4.4 cm above the carina.  Otherwise, stable position of support apparatus. No pneumothorax. 2.  Grossly unchanged bilateral air space opacities with differential considerations again including asymmetric alveolar pulmonary edema versus multifocal infection.   Original Report Authenticated By: Waynard Reeds, M.D.     Medications / Allergies: per chart  Antibiotics: Anti-infectives     Start     Dose/Rate Route Frequency Ordered Stop   01/16/12 1800  piperacillin-tazobactam (ZOSYN) IVPB 3.375 g       3.375 g 12.5 mL/hr over 240 Minutes Intravenous 3 times per day 01/16/12 1721

## 2012-01-21 NOTE — Progress Notes (Signed)
Name: [name] MRN: [id number] DOB: 1970/11/01    LOS: 5  Referring Provider:  CCS Reason for Referral:  Acute respiratory failure  PULMONARY / CRITICAL CARE MEDICINE  Brief patient description:  41 yo morbidly obese male with suspected OSA/OHS admitted 9/30 with abdominal pain, nausea and vomiting s/p exploratory laparotomy with partial colectomy and colostomy (10/1).  The post op course has been complicated by hypoxemic respiratory failure requiring intubation.  Events Since Admission: 9/30  Admitted with abdominal pain, nausea and vomiting 10/1  Exploratory laparotomy, partial colectomy, colostomy; peritoneal abscess noted 10/2  Intubated for hypoxemic respiratory failure  Current Status: Sedated, currently on SBT Vital Signs: Temp:  [99.4 F (37.4 C)-103.2 F (39.6 C)] 103.2 F (39.6 C) (10/05 0800) Pulse Rate:  [102-122] 117  (10/05 0900) Resp:  [16-40] 40  (10/05 0900) BP: (110-163)/(59-96) 163/96 mmHg (10/05 0900) SpO2:  [95 %-98 %] 96 % (10/05 0900) FiO2 (%):  [40 %] 40 % (10/05 0910)  Physical Examination:  Gen: obese, sedated on vent, comfortable HEENT: NCAT, PERRL, ETT in place PULM: scattered rhonchi CV: RRR, no mgr AB: BS+ (infrequent), wound vac and colostomy in place Ext: warm, scds, trace edema Neuro: Sedated on vent  Principal Problem:  *Colon obstruction Active Problems:  UTI (lower urinary tract infection)  Diverticulitis of colon without hemorrhage  Obesity, Class III, BMI 40-49.9 (morbid obesity)  Acute respiratory failure with hypoxia  Acute pulmonary edema  Encephalopathy acute  Tobacco abuse  Intra-abdominal abscess, diverticular s/p surgical drainage  ASSESSMENT AND PLAN  PULMONARY  Lab 01/21/12 0421 01/20/12 0422 01/19/12 1055 01/18/12 2227 01/18/12 1924  PHART 7.433 7.407 7.383 7.335* 7.321*  PCO2ART 50.0* 52.3* 49.7* 48.4* 48.1*  PO2ART 82.1 105.0* 119.0* 70.5* 63.8*  HCO3 32.4* 31.9* 28.2* 24.8* 23.9  O2SAT 95.3 97.6  98.0 92.0 89.0   Ventilator Settings: Vent Mode:  [-] PRVC FiO2 (%):  [40 %] 40 % Set Rate:  [14 bmp] 14 bmp Vt Set:  [580 mL] 580 mL PEEP:  [5 cmH20] 5 cmH20 Pressure Support:  [8 cmH20] 8 cmH20 Plateau Pressure:  [23 cmH20-24 cmH20] 24 cmH20  CXR:  10/4>>> Hypoinflation, bilateral airspace disease consistent with pulmonary edema, no interval improvement, in spite of aggressive diuresis  ETT:  10/2 >>>  A:   1) Acute hypoxemic / hypercarbic respiratory failure secondary to acute pulmonary edema in setting of aggressive fluid resuscitation (~ 6L positive) and decreased FRC due to abdominal distension after exploratory lap. Given little change in x-ray would add acute lung injury to the differential. 2) Atelectasis. 3) Suspected OSA/OHS. Almost 3.4 L negative on 10/4 chest x-ray still demonstrating bilateral airspace disease without significant improvement. His ventilator mechanics look much better than his x-ray.  P:   Daily assessment for weaning, failed on 10/5, will diurese and reattempt in AM. Daily CXR/ABG. Daily SBT. Combivent. Continue diuresis as ordered. ?when patient will be going to the OR for wound vac change.  CARDIOVASCULAR  Lab 01/21/12 0520 01/19/12 1850 01/19/12 1035 01/19/12 0503 01/19/12 0441 01/18/12 2322 01/18/12 2317 01/18/12 2006 01/18/12 2005  TROPONINI -- <0.30 <0.30 <0.30 -- <0.30 -- -- --  LATICACIDVEN -- -- 1.8 -- 2.1 -- 2.2 2.3* --  PROBNP 82.8 -- -- -- -- -- -- -- 364.1*   ECG:  Pending Lines: R [place] TLC 10/2 >>>  A: Hemodynamically stable.   P:  Goal MAP > 60  RENAL  Lab 01/21/12 0520 01/20/12 0437 01/18/12 2322 01/18/12 01/17/12 0353  NA 146*  145 141 135 136  K 3.8 3.6 -- -- --  CL 105 106 107 102 100  CO2 33* 32 26 23 25   BUN 25* 24* 15 11 12   CREATININE 1.01 1.11 1.01 0.71 0.59  CALCIUM 8.5 8.4 8.0* 8.0* 8.8  MG 2.5 -- -- -- --  PHOS 3.1 -- -- -- --   Intake/Output      10/04 0701 - 10/05 0700 10/05 0701 - 10/06 0700   I.V.  (mL/kg) 474.1 (3.5) 36 (0.3)   Other 20 40   IV Piggyback 404 25   Total Intake(mL/kg) 898.1 (6.5) 101 (0.7)   Urine (mL/kg/hr) 4265 (1.3) 900   Emesis/NG output     Total Output 4265 900   Net -3366.9 -799         Foley:  10/1 >>>  A:  Clinically volume overloaded.  Normal renal function. Received Lasix on 10/3. (see pulm section) P:   IVF to [facility]. Goal CVP 3-4. Trend BMP. Lasix. Replace K.  GASTROINTESTINAL  Lab 01/21/12 0520 01/18/12 2322 01/18/12 01/17/12 0353 01/16/12 1210  AST 22 22 15 15 15   ALT 13 14 15 18 25   ALKPHOS 72 66 73 102 120*  BILITOT 0.4 0.7 0.7 0.4 0.5  PROT 6.2 5.2* 5.5* 6.8 7.7  ALBUMIN 1.9* 1.9* 2.2* 2.8* 3.3*   A:  Status post exploratory lap, partial colectomy, colostomy. P:   TPN to be started tonight. NGT to Sx. Per CCS.  HEMATOLOGIC  Lab 01/21/12 0520 01/20/12 0437 01/18/12 2322 01/18/12 01/17/12 0353  HGB 12.9* 12.3* 14.7 17.8* 15.5  HCT 40.0 38.3* 44.7 51.4 45.7  PLT 378 364 421* 528* 496*  INR -- -- -- -- --  APTT -- -- -- -- --   A:  Hemoconcentration?  Reactive thrombocytosis. No evidence of bleeding, currently on enoxaparin. Did have 2 g hemoglobin drift over 48 hours. P:  Trend CBC CBC continues to drift may need to consider transition to SCDs.  INFECTIOUS  Lab 01/21/12 0520 01/20/12 0437 01/19/12 0503 01/18/12 2322 01/18/12 2005 01/18/12 01/17/12 0353  WBC 10.5 8.3 -- 16.7* -- 7.6 7.4  PROCALCITON -- 1.54 2.37 -- 2.43 -- --   Cultures: Blood culture x2 10/3>>>NTD  Antibiotics: Zosyn 9/30 >>>  A:  Peritonitis, diverticular abscess P:   Antibiotics / cultures as above  ENDOCRINE  Lab 01/21/12 0740 01/21/12 0453 01/21/12 0031 01/20/12 1939 01/20/12 1647  GLUCAP 88 88 77 85 96   A:  Hyperglycemia. Glycemic control P:   Goal CBG 120 to 180 SSI  NEUROLOGIC  A:  Acute encephalopathy secondary to sedation.  Post op pain.  P:   Goal RASS 0  Daily WUA Fentanyl / Versed gtt D/c PCA as intubated  BEST  PRACTICE / DISPOSITION Level of Care:  ICU Primary Service:  CCS Consultants:  PCCM Code Status:  Full Diet:  NPO DVT Px:  Protonix GI Px:  Lovenox Skin Integrity:  Intact Social / Family:  Updated family at bedside  Will aggressively diurese today and reattempt weaning in AM since patient failed today.  Will SBT while on precedex in AM.  CC time 35 min.  Alyson Reedy, M.D. [facility] Pulmonary/Critical Care Medicine. Pager: [phone]. After hours pager: [phone].

## 2012-01-22 ENCOUNTER — Inpatient Hospital Stay (HOSPITAL_COMMUNITY): Payer: Medicaid Other

## 2012-01-22 DIAGNOSIS — E781 Pure hyperglyceridemia: Secondary | ICD-10-CM

## 2012-01-22 LAB — BLOOD GAS, ARTERIAL
Acid-Base Excess: 8.6 mmol/L — ABNORMAL HIGH (ref 0.0–2.0)
Drawn by: 317871
FIO2: 0.4 %
MECHVT: 0.58 mL
RATE: 14 resp/min
TCO2: 28.7 mmol/L (ref 0–100)
pCO2 arterial: 50.6 mmHg — ABNORMAL HIGH (ref 35.0–45.0)
pH, Arterial: 7.442 (ref 7.350–7.450)
pO2, Arterial: 94.4 mmHg (ref 80.0–100.0)

## 2012-01-22 LAB — BASIC METABOLIC PANEL
BUN: 29 mg/dL — ABNORMAL HIGH (ref 6–23)
Calcium: 8.2 mg/dL — ABNORMAL LOW (ref 8.4–10.5)
Creatinine, Ser: 1.01 mg/dL (ref 0.50–1.35)
GFR calc Af Amer: >90 mL/min (ref >90)

## 2012-01-22 LAB — GLUCOSE, CAPILLARY
Glucose-Capillary: 125 mg/dL — ABNORMAL HIGH (ref 70–99)
Glucose-Capillary: 129 mg/dL — ABNORMAL HIGH (ref 70–99)
Glucose-Capillary: 131 mg/dL — ABNORMAL HIGH (ref 70–99)

## 2012-01-22 LAB — CBC
MCH: 30.2 pg (ref 26.0–34.0)
MCHC: 31.2 g/dL (ref 30.0–36.0)
MCV: 96.9 fL (ref 78.0–100.0)
Platelets: 329 10*3/uL (ref 150–400)

## 2012-01-22 LAB — MAGNESIUM: Magnesium: 2.8 mg/dL — ABNORMAL HIGH (ref 1.5–2.5)

## 2012-01-22 MED ORDER — POTASSIUM CHLORIDE 10 MEQ/50ML IV SOLN
10.0000 meq | INTRAVENOUS | Status: AC
  Administered 2012-01-22 – 2012-01-23 (×5): 10 meq via INTRAVENOUS
  Filled 2012-01-22: qty 200

## 2012-01-22 MED ORDER — FREE WATER
200.0000 mL | Freq: Four times a day (QID) | Status: DC
  Administered 2012-01-22 – 2012-01-23 (×4): 200 mL

## 2012-01-22 MED ORDER — FENTANYL CITRATE 0.05 MG/ML IJ SOLN: 50.0000 ug | INTRAMUSCULAR | Status: DC | PRN

## 2012-01-22 MED ORDER — POTASSIUM CHLORIDE 10 MEQ/100ML IV SOLN: 10.0000 meq | INTRAVENOUS | Status: DC

## 2012-01-22 MED ORDER — ACETAMINOPHEN 160 MG/5ML PO SOLN
650.0000 mg | Freq: Four times a day (QID) | ORAL | Status: DC | PRN
  Administered 2012-01-22 – 2012-01-26 (×8): 650 mg
  Filled 2012-01-22 (×7): qty 20.3

## 2012-01-22 MED ORDER — ACETAMINOPHEN 10 MG/ML IV SOLN
1000.0000 mg | Freq: Four times a day (QID) | INTRAVENOUS | Status: DC
  Filled 2012-01-22 (×2): qty 100

## 2012-01-22 MED ORDER — FUROSEMIDE 10 MG/ML IJ SOLN
40.0000 mg | Freq: Four times a day (QID) | INTRAMUSCULAR | Status: AC
  Administered 2012-01-22 (×3): 40 mg via INTRAVENOUS
  Filled 2012-01-22 (×3): qty 4

## 2012-01-22 MED ORDER — CLINIMIX E/DEXTROSE (5/20) 5 % IV SOLN
INTRAVENOUS | Status: AC
  Administered 2012-01-22: 17:00:00 via INTRAVENOUS
  Filled 2012-01-22: qty 1000

## 2012-01-22 MED ORDER — MIDAZOLAM HCL 2 MG/2ML IJ SOLN
2.0000 mg | INTRAMUSCULAR | Status: DC | PRN
  Administered 2012-01-24: 1 mg via INTRAVENOUS

## 2012-01-22 NOTE — Progress Notes (Signed)
[name] [id number] [birth year]   Subjective:  No major events Less agitated off sedation but not fully alert Min vent settings Family in room ICU tean MD/RN outside, discussing wean parmeters re-trial  Objective:  Vital signs:  Filed Vitals:   01/22/12 0400 01/22/12 0423 01/22/12 0600 01/22/12 0800  BP: 113/70  106/64 119/70  Pulse: 96  89 100  Temp: 101.2 F (38.4 C)   100.7 F (38.2 C)  TempSrc: Oral   Axillary  Resp: 16  17 29   Height:      Weight:      SpO2: 97% 97% 96% 97%       Intake/Output   Yesterday:  10/05 0701 - 10/06 0700 In: 1920 [I.V.:642; IV Piggyback:758; TPN:480] Out: 4115 [Urine:3845; Emesis/NG output:150; Drains:120] This shift:  Total I/O In: -  Out: 100 [Urine:100]  Bowel function:  Flatus: n  BM: n  Physical Exam:  General: Pt intubated in no acute distress Eyes: PERRL, normal EOM.  Sclera clear.  No icterus Neuro: CN II-XII intact w/o focal sensory/motor deficits. Lymph: No head/neck/groin lymphadenopathy Psych:  No delerium/psychosis/paranoia HENT: Normocephalic, Mucus membranes moist.  No thrush.  ETT in place Neck: Supple, No tracheal deviation Chest: No chest wall pain w good excursion CV:  Pulses intact.  Regular rhythm Abdomen: Soft.  Nondistended.  Wound vac in place.  No incarcerated hernias.  Ostomy + gas/stool Ext:  SCDs BLE.  No mjr edema.  No cyanosis Skin: No petechiae / purpurae  Problem List:  Principal Problem:  *Colon obstruction Active Problems:  UTI (lower urinary tract infection)  Diverticulitis of colon without hemorrhage  Obesity, Class III, BMI 40-49.9 (morbid obesity)  Acute respiratory failure with hypoxia  Acute pulmonary edema  Encephalopathy acute  Tobacco abuse  Intra-abdominal abscess, diverticular s/p surgical drainage   Assessment  [name]  41 y.o. male  5 Days Post-Op  Procedure(s): PARTIAL COLECTOMY COLOSTOMY  Stablizing but still on vent in smoking male  requiring emergent colectomy/ostomy  Post op ileua resolving  Plan: -diuresis -wean vent as tolerated.  OK to extubate  -pulmonary toilet -wound wac for SQ wound - change MWF -try enteral feeds - PO when extibated vs NGT feeds if anticipate on vent for more days -IV ABx -nicotine patch for tobacco w/d - pt refused -B blocker for tachy as tolerated -VTE prophylaxis- SCDs, etc -mobilize as tolerated to help recovery  I discussed the patient's status to the family.  Questions were answered.  They expressed understanding & appreciation.  They told me there is no way he gets to smoke ever again   Ardeth Sportsman, M.D., F.A.C.S. Gastrointestinal and Minimally Invasive Surgery [facility] [address], [address] [place], [place] [address] [phone] Main / Paging [phone] Voice Mail   01/22/2012  CARE TEAM:  PCP: No primary provider on file.  Outpatient Care Team: Patient has no care team.  Inpatient Treatment Team: Treatment Team: Attending Provider: Md Montez Morita, MD; Registered Nurse: Bethann Goo, RN; Dietitian: Lavena Bullion, RD; Registered Nurse: Oda Kilts, RN; Rounding Team: Md Pccm, MD; Registered Nurse: Neomia Dear, RN; Technician: Koleen Distance, [place]; Registered Nurse: Gerald Stabs, RN; Registered Nurse: Lorrin Goodell, RN; Registered Nurse: Patrina Levering, RN; Respiratory Therapist: Renold [name], RRT   Results:   Labs: Results for orders placed during the hospital encounter of 01/16/12 (from the past 48 hour(s))  GLUCOSE, CAPILLARY     Status: Abnormal  Collection Time   01/20/12 12:18 PM      Component Value Range Comment   Glucose-Capillary 109 (*) 70 - 99 mg/dL    Comment 1 Documented in Chart      Comment 2 Notify RN     GLUCOSE, CAPILLARY     Status: Normal   Collection Time   01/20/12  4:47 PM      Component Value Range Comment   Glucose-Capillary 96  70 - 99 mg/dL   GLUCOSE,  CAPILLARY     Status: Normal   Collection Time   01/20/12  7:39 PM      Component Value Range Comment   Glucose-Capillary 85  70 - 99 mg/dL   GLUCOSE, CAPILLARY     Status: Normal   Collection Time   01/21/12 12:31 AM      Component Value Range Comment   Glucose-Capillary 77  70 - 99 mg/dL    Comment 1 Documented in Chart      Comment 2 Notify RN     BLOOD GAS, ARTERIAL     Status: Abnormal   Collection Time   01/21/12  4:21 AM      Component Value Range Comment   FIO2 0.40      Delivery systems VENTILATOR      Mode PRESSURE REGULATED VOLUME CONTROL      VT 580      Rate 14      Peep/cpap 5.0      pH, Arterial 7.433  7.350 - 7.450    pCO2 arterial 50.0 (*) 35.0 - 45.0 mmHg    pO2, Arterial 82.1  80.0 - 100.0 mmHg    Bicarbonate 32.4 (*) 20.0 - 24.0 mEq/L    TCO2 28.7  0 - 100 mmol/L    Acid-Base Excess 7.6 (*) 0.0 - 2.0 mmol/L    O2 Saturation 95.3      Patient temperature 100.4      Collection site RIGHT RADIAL      Drawn by [phone]      Sample type ARTERIAL      Allens test (pass/fail) PASS  PASS   GLUCOSE, CAPILLARY     Status: Normal   Collection Time   01/21/12  4:53 AM      Component Value Range Comment   Glucose-Capillary 88  70 - 99 mg/dL    Comment 1 Notify RN      Comment 2 Documented in Chart     CBC     Status: Abnormal   Collection Time   01/21/12  5:20 AM      Component Value Range Comment   WBC 10.5  4.0 - 10.5 K/uL    RBC 4.18 (*) 4.22 - 5.81 MIL/uL    Hemoglobin 12.9 (*) 13.0 - 17.0 g/dL    HCT 29.5  62.1 - 30.8 %    MCV 95.7  78.0 - 100.0 fL    MCH 30.9  26.0 - 34.0 pg    MCHC 32.3  30.0 - 36.0 g/dL    RDW 65.7  84.6 - 96.2 %    Platelets 378  150 - 400 K/uL   PRO B NATRIURETIC PEPTIDE     Status: Normal   Collection Time   01/21/12  5:20 AM      Component Value Range Comment   Pro B Natriuretic peptide (BNP) 82.8  0 - 125 pg/mL   COMPREHENSIVE METABOLIC PANEL     Status: Abnormal   Collection Time  01/21/12  5:20 AM      Component Value Range  Comment   Sodium 146 (*) 135 - 145 mEq/L    Potassium 3.8  3.5 - 5.1 mEq/L    Chloride 105  96 - 112 mEq/L    CO2 33 (*) 19 - 32 mEq/L    Glucose, Bld 92  70 - 99 mg/dL    BUN 25 (*) 6 - 23 mg/dL    Creatinine, Ser 1.61  0.50 - 1.35 mg/dL    Calcium 8.5  8.4 - 09.6 mg/dL    Total Protein 6.2  6.0 - 8.3 g/dL    Albumin 1.9 (*) 3.5 - 5.2 g/dL    AST 22  0 - 37 U/L    ALT 13  0 - 53 U/L    Alkaline Phosphatase 72  39 - 117 U/L    Total Bilirubin 0.4  0.3 - 1.2 mg/dL    GFR calc non Af Amer >90  >90 mL/min    GFR calc Af Amer >90  >90 mL/min   MAGNESIUM     Status: Normal   Collection Time   01/21/12  5:20 AM      Component Value Range Comment   Magnesium 2.5  1.5 - 2.5 mg/dL   PHOSPHORUS     Status: Normal   Collection Time   01/21/12  5:20 AM      Component Value Range Comment   Phosphorus 3.1  2.3 - 4.6 mg/dL   TRIGLYCERIDES     Status: Abnormal   Collection Time   01/21/12  5:20 AM      Component Value Range Comment   Triglycerides 422 (*) <150 mg/dL   CHOLESTEROL, TOTAL     Status: Normal   Collection Time   01/21/12  5:20 AM      Component Value Range Comment   Cholesterol 144  0 - 200 mg/dL   GLUCOSE, CAPILLARY     Status: Normal   Collection Time   01/21/12  7:40 AM      Component Value Range Comment   Glucose-Capillary 88  70 - 99 mg/dL    Comment 1 Documented in Chart      Comment 2 Notify RN     GLUCOSE, CAPILLARY     Status: Normal   Collection Time   01/21/12 11:47 AM      Component Value Range Comment   Glucose-Capillary 96  70 - 99 mg/dL    Comment 1 Documented in Chart      Comment 2 Notify RN     GLUCOSE, CAPILLARY     Status: Normal   Collection Time   01/21/12  3:55 PM      Component Value Range Comment   Glucose-Capillary 89  70 - 99 mg/dL   GLUCOSE, CAPILLARY     Status: Abnormal   Collection Time   01/21/12  8:15 PM      Component Value Range Comment   Glucose-Capillary 130 (*) 70 - 99 mg/dL    Comment 1 Notify RN      Comment 2 Documented in  Chart     GLUCOSE, CAPILLARY     Status: Abnormal   Collection Time   01/22/12 12:16 AM      Component Value Range Comment   Glucose-Capillary 133 (*) 70 - 99 mg/dL   GLUCOSE, CAPILLARY     Status: Abnormal   Collection Time   01/22/12  4:00 AM      Component  Value Range Comment   Glucose-Capillary 125 (*) 70 - 99 mg/dL   BLOOD GAS, ARTERIAL     Status: Abnormal   Collection Time   01/22/12  4:08 AM      Component Value Range Comment   FIO2 0.40      Delivery systems VENTILATOR      Mode PRESSURE REGULATED VOLUME CONTROL      VT 0.580      Rate 14.0      Peep/cpap 5.0      pH, Arterial 7.442  7.350 - 7.450    pCO2 arterial 50.6 (*) 35.0 - 45.0 mmHg    pO2, Arterial 94.4  80.0 - 100.0 mmHg    Bicarbonate 33.4 (*) 20.0 - 24.0 mEq/L    TCO2 28.7  0 - 100 mmol/L    Acid-Base Excess 8.6 (*) 0.0 - 2.0 mmol/L    O2 Saturation 96.4      Patient temperature 101.2      Collection site RIGHT RADIAL      Drawn by [id number]      Sample type ARTERIAL DRAW      Allens test (pass/fail) PASS  PASS   BASIC METABOLIC PANEL     Status: Abnormal   Collection Time   01/22/12  5:50 AM      Component Value Range Comment   Sodium 149 (*) 135 - 145 mEq/L    Potassium 3.6  3.5 - 5.1 mEq/L    Chloride 107  96 - 112 mEq/L    CO2 34 (*) 19 - 32 mEq/L    Glucose, Bld 146 (*) 70 - 99 mg/dL    BUN 29 (*) 6 - 23 mg/dL    Creatinine, Ser 0.45  0.50 - 1.35 mg/dL    Calcium 8.2 (*) 8.4 - 10.5 mg/dL    GFR calc non Af Amer >90  >90 mL/min    GFR calc Af Amer >90  >90 mL/min   MAGNESIUM     Status: Abnormal   Collection Time   01/22/12  5:50 AM      Component Value Range Comment   Magnesium 2.8 (*) 1.5 - 2.5 mg/dL   PHOSPHORUS     Status: Normal   Collection Time   01/22/12  5:50 AM      Component Value Range Comment   Phosphorus 2.9  2.3 - 4.6 mg/dL   CBC     Status: Abnormal   Collection Time   01/22/12  5:50 AM      Component Value Range Comment   WBC 8.3  4.0 - 10.5 K/uL    RBC 3.87 (*) 4.22 - 5.81  MIL/uL    Hemoglobin 11.7 (*) 13.0 - 17.0 g/dL    HCT 40.9 (*) 81.1 - 52.0 %    MCV 96.9  78.0 - 100.0 fL    MCH 30.2  26.0 - 34.0 pg    MCHC 31.2  30.0 - 36.0 g/dL    RDW 91.4  78.2 - 95.6 %    Platelets 329  150 - 400 K/uL   GLUCOSE, CAPILLARY     Status: Abnormal   Collection Time   01/22/12  7:33 AM      Component Value Range Comment   Glucose-Capillary 137 (*) 70 - 99 mg/dL    Comment 1 Notify RN      Comment 2 Documented in Chart       Imaging / Studies: Dg Chest Port 1 View  01/21/2012  *RADIOLOGY  REPORT*  Clinical Data: Respiratory failure.  PORTABLE CHEST - 1 VIEW  Comparison: 01/20/2012  Findings: Endotracheal tube tip approximately 3.8 cm above the carina.  Persistent airspace disease and pulmonary edema of both lungs with reduced lung volumes since the prior study.  Bibasilar atelectasis present.  No large pleural effusions identified.  Heart size is stable.  IMPRESSION: Reduced lung volumes with persistent airspace disease and pulmonary edema.   Original Report Authenticated By: Reola Calkins, M.D.     Medications / Allergies: per chart  Antibiotics: Anti-infectives     Start     Dose/Rate Route Frequency Ordered Stop   01/16/12 1800  piperacillin-tazobactam (ZOSYN) IVPB 3.375 g       3.375 g 12.5 mL/hr over 240 Minutes Intravenous 3 times per day 01/16/12 1721

## 2012-01-22 NOTE — Progress Notes (Signed)
Name: [name] MRN: [id number] DOB: 14-Sep-1970    LOS: 6  Referring Provider:  CCS Reason for Referral:  Acute respiratory failure  PULMONARY / CRITICAL CARE MEDICINE  Brief patient description:  41 yo morbidly obese male with suspected OSA/OHS admitted 9/30 with abdominal pain, nausea and vomiting s/p exploratory laparotomy with partial colectomy and colostomy (10/1).  The post op course has been complicated by hypoxemic respiratory failure requiring intubation.  Events Since Admission: 9/30  Admitted with abdominal pain, nausea and vomiting 10/1  Exploratory laparotomy, partial colectomy, colostomy; peritoneal abscess noted 10/2  Intubated for hypoxemic respiratory failure  Current Status: Sedated, currently on SBT Vital Signs: Temp:  [99.7 F (37.6 C)-102.2 F (39 C)] 100.7 F (38.2 C) (10/06 0800) Pulse Rate:  [89-105] 102  (10/06 1000) Resp:  [16-29] 26  (10/06 1000) BP: (106-120)/(64-82) 120/71 mmHg (10/06 1000) SpO2:  [95 %-98 %] 95 % (10/06 1000) FiO2 (%):  [30 %-40 %] 30 % (10/06 1000) Weight:  [128.1 kg (282 lb 6.6 oz)] 128.1 kg (282 lb 6.6 oz) (10/06 0000)  Physical Examination:  Gen: obese, sedated on vent, comfortable HEENT: NCAT, PERRL, ETT in place PULM: scattered rhonchi CV: RRR, no mgr AB: BS+ (infrequent), wound vac and colostomy in place Ext: warm, scds, trace edema Neuro: Sedated on vent  Principal Problem:  *Colon obstruction Active Problems:  UTI (lower urinary tract infection)  Diverticulitis of colon without hemorrhage  Obesity, Class III, BMI 40-49.9 (morbid obesity)  Acute respiratory failure with hypoxia  Acute pulmonary edema  Encephalopathy acute  Tobacco abuse  Intra-abdominal abscess, diverticular s/p surgical drainage  Hypertriglyceridemia  ASSESSMENT AND PLAN  PULMONARY  Lab 01/22/12 0408 01/21/12 0421 01/20/12 0422 01/19/12 1055 01/18/12 2227  PHART 7.442 7.433 7.407 7.383 7.335*  PCO2ART 50.6* 50.0* 52.3* 49.7*  48.4*  PO2ART 94.4 82.1 105.0* 119.0* 70.5*  HCO3 33.4* 32.4* 31.9* 28.2* 24.8*  O2SAT 96.4 95.3 97.6 98.0 92.0   Ventilator Settings: Vent Mode:  [-] PSV FiO2 (%):  [30 %-40 %] 30 % Set Rate:  [14 bmp] 14 bmp Vt Set:  [580 mL] 580 mL PEEP:  [5 cmH20] 5 cmH20 Pressure Support:  [5 cmH20-8 cmH20] 5 cmH20 Plateau Pressure:  [21 cmH20-23 cmH20] 22 cmH20  CXR:  10/4>>> Hypoinflation, bilateral airspace disease consistent with pulmonary edema, no interval improvement, in spite of aggressive diuresis  ETT:  10/2 >>>  A:   1) Acute hypoxemic / hypercarbic respiratory failure secondary to acute pulmonary edema in setting of aggressive fluid resuscitation (~ 6L positive) and decreased FRC due to abdominal distension after exploratory lap. Given little change in x-ray would add acute lung injury to the differential. 2) Atelectasis. 3) Suspected OSA/OHS. Almost 1.7 L negative on 10/5 chest x-ray still demonstrating bilateral airspace disease without significant improvement. His ventilator mechanics look much better than his x-ray.  P:   Daily assessment for weaning, failed on 10/5, will diurese and reattempt in AM. Daily CXR/ABG. Daily SBT. Combivent. Continue diuresis as ordered. ?when patient will be going to the OR for wound vac change.  CARDIOVASCULAR  Lab 01/21/12 0520 01/19/12 1850 01/19/12 1035 01/19/12 0503 01/19/12 0441 01/18/12 2322 01/18/12 2317 01/18/12 2006 01/18/12 2005  TROPONINI -- <0.30 <0.30 <0.30 -- <0.30 -- -- --  LATICACIDVEN -- -- 1.8 -- 2.1 -- 2.2 2.3* --  PROBNP 82.8 -- -- -- -- -- -- -- 364.1*   ECG:  Pending Lines: [facility] [place] TLC 10/2 >>>  A: Hemodynamically stable.   P:  Goal MAP > 60  RENAL  Lab 01/22/12 0550 01/21/12 0520 01/20/12 0437 01/18/12 2322 01/18/12  NA 149* 146* 145 141 135  K 3.6 3.8 -- -- --  CL 107 105 106 107 102  CO2 34* 33* 32 26 23  BUN 29* 25* 24* 15 11  CREATININE 1.01 1.01 1.11 1.01 0.71  CALCIUM 8.2* 8.5 8.4 8.0* 8.0*  MG 2.8*  2.5 -- -- --  PHOS 2.9 3.1 -- -- --   Intake/Output      10/05 0701 - 10/06 0700 10/06 0701 - 10/07 0700   I.V. (mL/kg) 692 (5.4) 40 (0.3)   Other 40    IV Piggyback 770.5 12.5   TPN 520 120   Total Intake(mL/kg) 2022.5 (15.8) 172.5 (1.3)   Urine (mL/kg/hr) 3845 (1.3) 300 (0.5)   Emesis/NG output 150    Drains 120    Total Output 4115 300   Net -2092.5 -127.5          Intake/Output Summary (Last 24 hours) at 01/22/12 1117 Last data filed at 01/22/12 1000  Gross per 24 hour  Intake   1854 ml  Output   3515 ml  Net  -1661 ml   Foley:  10/1 >>>  A:  Clinically volume overloaded.  Normal renal function. Received Lasix on 10/3. (see pulm section) P:   IVF to [facility]. Goal CVP 3-4. Trend BMP. Lasix 40 q6 x3. Free water for hypernatremia. Replace K.  GASTROINTESTINAL  Lab 01/21/12 0520 01/18/12 2322 01/18/12 01/17/12 0353 01/16/12 1210  AST 22 22 15 15 15   ALT 13 14 15 18 25   ALKPHOS 72 66 73 102 120*  BILITOT 0.4 0.7 0.7 0.4 0.5  PROT 6.2 5.2* 5.5* 6.8 7.7  ALBUMIN 1.9* 1.9* 2.2* 2.8* 3.3*   A:  Status post exploratory lap, partial colectomy, colostomy. P:   Start TF and if tolerated would d/c TPN. Per CCS.  HEMATOLOGIC  Lab 01/22/12 0550 01/21/12 0520 01/20/12 0437 01/18/12 2322 01/18/12  HGB 11.7* 12.9* 12.3* 14.7 17.8*  HCT 37.5* 40.0 38.3* 44.7 51.4  PLT 329 378 364 421* 528*  INR -- -- -- -- --  APTT -- -- -- -- --   A:  Hemoconcentration?  Reactive thrombocytosis. No evidence of bleeding, currently on enoxaparin. Did have 2 g hemoglobin drift over 48 hours. P:  Trend CBC CBC continues to drift may need to consider transition to SCDs.  INFECTIOUS  Lab 01/22/12 0550 01/21/12 0520 01/20/12 0437 01/19/12 0503 01/18/12 2322 01/18/12 2005 01/18/12  WBC 8.3 10.5 8.3 -- 16.7* -- 7.6  PROCALCITON -- -- 1.54 2.37 -- 2.43 --   Cultures: Blood culture x2 10/3>>>NTD  Antibiotics: Zosyn 9/30 >>>  A:  Peritonitis, diverticular abscess P:   Antibiotics /  cultures as above  ENDOCRINE  Lab 01/22/12 0733 01/22/12 0400 01/22/12 0016 01/21/12 2015 01/21/12 1555  GLUCAP 137* 125* 133* 130* 89   A:  Hyperglycemia. Glycemic control P:   Goal CBG 120 to 180 SSI  NEUROLOGIC  A:  Acute encephalopathy secondary to sedation.  Post op pain.  P:   Goal RASS 0  Daily WUA Fentanyl / Versed gtt D/c PCA as intubated  BEST PRACTICE / DISPOSITION Level of Care:  ICU Primary Service:  CCS Consultants:  PCCM Code Status:  Full Diet:  NPO DVT Px:  Protonix GI Px:  Lovenox Skin Integrity:  Intact Social / Family:  Updated family at bedside  CC time 35 min.  Alyson Reedy,  M.D. [facility] Pulmonary/Critical Care Medicine. Pager: [phone]. After hours pager: [phone].

## 2012-01-22 NOTE — Progress Notes (Signed)
PARENTERAL NUTRITION CONSULT NOTE - FOLLOW UP  Pharmacy Consult for TNA Indication: Bowel rest after resection/colostomy  No Known Allergies  Patient Measurements: Height: 5\' 10"  (177.8 cm) Weight: 282 lb 6.6 oz (128.1 kg) IBW/kg (Calculated) : 73  Adjusted Body Weight: 99kg Noted 40lb wt loss pre-op (remains obese)  Vital Signs: Temp: 100.7 F (38.2 C) (10/06 0800) Temp src: Axillary (10/06 0800) BP: 119/70 mmHg (10/06 0800) Pulse Rate: 100  (10/06 0800) Intake/Output from previous day: 10/05 0701 - 10/06 0700 In: 1920 [I.V.:642; IV Piggyback:758; TPN:480] Out: 4696 [Urine:3845; Emesis/NG output:150; Drains:120] Intake/Output from this shift: Total I/O In: -  Out: 100 [Urine:100]  Labs:  [facility] 01/22/12 0550 01/21/12 0520 01/20/12 0437  WBC 8.3 10.5 8.3  HGB 11.7* 12.9* 12.3*  HCT 37.5* 40.0 38.3*  PLT 329 378 364  APTT -- -- --  INR -- -- --     Basename 01/22/12 0550 01/21/12 0520 01/20/12 0437  NA 149* 146* 145  K 3.6 3.8 3.6  CL 107 105 106  CO2 34* 33* 32  GLUCOSE 146* 92 106*  BUN 29* 25* 24*  CREATININE 1.01 1.01 1.11  LABCREA -- -- --  CREAT24HRUR -- -- --  CALCIUM 8.2* 8.5 8.4  MG 2.8* 2.5 --  PHOS 2.9 3.1 --  PROT -- 6.2 --  ALBUMIN -- 1.9* --  AST -- 22 --  ALT -- 13 --  ALKPHOS -- 72 --  BILITOT -- 0.4 --  BILIDIR -- -- --  IBILI -- -- --  PREALBUMIN -- -- --  TRIG -- 422* --  CHOLHDL -- -- --  CHOL -- 144 --   Estimated Creatinine Clearance: 130.6 ml/min (by C-G formula based on Cr of 1.01).    Basename 01/22/12 0733 01/22/12 0400 01/22/12 0016  GLUCAP 137* 125* 133*    Medications:  Scheduled:     . acetaminophen  1,000 mg Intravenous Q6H  . albuterol-ipratropium  6 puff Inhalation Q4H  . antiseptic oral rinse  15 mL Mouth Rinse QID  . chlorhexidine  15 mL Mouth Rinse BID  . enoxaparin  40 mg Subcutaneous Q24H  . furosemide  40 mg Intravenous Q6H  . insulin aspart  0-15 Units Subcutaneous Q4H  . lip balm  1  application Topical BID  . metoprolol  5 mg Intravenous Q6H  . nicotine  21 mg Transdermal Daily  . pantoprazole (PROTONIX) IV  40 mg Intravenous QHS  . piperacillin-tazobactam (ZOSYN)  IV  3.375 g Intravenous Q8H  . potassium chloride  10 mEq Intravenous Q1 Hr x 4  . DISCONTD: furosemide  40 mg Intravenous Q8H   Infusions:     . dextrose 5 % and 0.9 % NaCl with KCl 20 mEq/L 20 mL/hr at 01/22/12 0111  . fentaNYL infusion INTRAVENOUS 100 mcg/hr (01/21/12 0824)  . midazolam (VERSED) infusion 2 mg/hr (01/21/12 2326)  . TPN (CLINIMIX) +/- additives 40 mL/hr at 01/21/12 1814   Nutritional Goals:  2500 kCal (permissive underfeeding/obese), and  90-115 grams of protein per day  Clinimix E 5/20 at goal rate 100 ml/hr will deliver: 120 g protein/day; avg 2318 kcal/day (2592 on MWF, 2112 on T,Th,Sa,Su)  Current Nutrition:  NPO from 9/30  Insulin Requirements in the past 24 hours:  CBG 88-133 mg/dl SSI moderate q 4 hr; 6 units/24 hr  Assessment: 41 yom s/p colectomy/colostomy 10/1 for bowel obstruction. BMI of 43 with noted weight loss prior to surgery. Remains intubated since surgery due to pulmonary edema, ALI with  fluid resuscitation.    Electrolytes: Na 149, Mag 2.8, otherwise wnl  Renal: wnl  Hepatic: wnl  TG/Cholesterol; 422/144 (10/5)  Fluid: D5NS with 20 mEq K/L at [facility], Furosemide 40mg  IV x 6 doses from 10/4  Plan:   Continue  Clinimix E 5/20 at 35ml/hr (no rate advancement till electrolytes more stable)  Advance to goal rate as tolerated, monitor for re-feeding syndrome.  IV, rate per MD.  Repeat Triglyceride tomorrow, consider holding Lipids with elevated level  MVI and Trace elements on MWF only due to national back order  Full TNA labs Mon,Thur  Otho Bellows PharmD Pager [phone] 01/22/2012,8:58 AM

## 2012-01-22 NOTE — Progress Notes (Signed)
Nutrition Follow-up  Intervention:    Pharmacy to dose TPN.  RD to follow.  Assessment:   Received consult for new TPN. Patient is currently intubated on ventilator support. Plans to wean as tolerated with PO when extubated. MV: 11.9 Temp:Temp (24hrs), Avg:100.8 F (38.2 C), Min:99.7 F (37.6 C), Max:102.2 F (39 C)  Current TPN regimen: Clinimix E 5/20 at 40 mL/hr with 20% lipids MWF to provide daily average 1050 kcal, 48 gm protein. Noted goal rate of 100 mL/hr with 20% lipids MWF to provide daily average 2318 kcal (93% estimated needs), 120 gm protein (>100% estimated needs).  Diet Order:  NPO  Meds: Scheduled Meds:   . acetaminophen  1,000 mg Intravenous Q6H  . albuterol-ipratropium  6 puff Inhalation Q4H  . antiseptic oral rinse  15 mL Mouth Rinse QID  . chlorhexidine  15 mL Mouth Rinse BID  . enoxaparin  40 mg Subcutaneous Q24H  . furosemide  40 mg Intravenous Q6H  . insulin aspart  0-15 Units Subcutaneous Q4H  . lip balm  1 application Topical BID  . metoprolol  5 mg Intravenous Q6H  . nicotine  21 mg Transdermal Daily  . pantoprazole (PROTONIX) IV  40 mg Intravenous QHS  . piperacillin-tazobactam (ZOSYN)  IV  3.375 g Intravenous Q8H  . potassium chloride  10 mEq Intravenous Q1 Hr x 4  . potassium chloride  10 mEq Intravenous Q1 Hr x 4  . DISCONTD: furosemide  40 mg Intravenous Q8H  . DISCONTD: potassium chloride  10 mEq Intravenous Q1 Hr x 4   Continuous Infusions:   . dextrose 5 % and 0.9 % NaCl with KCl 20 mEq/L 20 mL/hr (01/22/12 1000)  . fentaNYL infusion INTRAVENOUS 100 mcg/hr (01/21/12 0824)  . midazolam (VERSED) infusion 2 mg/hr (01/21/12 2326)  . TPN (CLINIMIX) +/- additives 40 mL/hr at 01/21/12 1814  . TPN (CLINIMIX) +/- additives     PRN Meds:.albuterol-ipratropium, alum & mag hydroxide-simeth, fentaNYL, magic mouthwash, menthol-cetylpyridinium, midazolam, ondansetron, phenol, DISCONTD: acetaminophen  Labs:  CMP     Component Value Date/Time   NA 149* 01/22/2012 0550   K 3.6 01/22/2012 0550   CL 107 01/22/2012 0550   CO2 34* 01/22/2012 0550   GLUCOSE 146* 01/22/2012 0550   BUN 29* 01/22/2012 0550   CREATININE 1.01 01/22/2012 0550   CALCIUM 8.2* 01/22/2012 0550   PROT 6.2 01/21/2012 0520   ALBUMIN 1.9* 01/21/2012 0520   AST 22 01/21/2012 0520   ALT 13 01/21/2012 0520   ALKPHOS 72 01/21/2012 0520   BILITOT 0.4 01/21/2012 0520   GFRNONAA >90 01/22/2012 0550   GFRAA >90 01/22/2012 0550  Magnesium: 2.8 mg/dL Phosphorus: 2.9 mg/dL  Triglycerides: 409 mg/dL   Intake/Output Summary (Last 24 hours) at 01/22/12 1023 Last data filed at 01/22/12 1000  Gross per 24 hour  Intake   1794 ml  Output   3515 ml  Net  -1721 ml    Weight Status:  282 lb, decreased  Estimated needs:  2491 kcal and 90-115 grams protein  Nutrition Dx:  Inadequate Oral Intake, Ongoing.   Goal: Meet > 90% of estimated energy needs, not met.   Monitor:  Labs, weight, extubation, diet advancement     [name] 01/22/2012, 10:22 AM

## 2012-01-23 ENCOUNTER — Inpatient Hospital Stay (HOSPITAL_COMMUNITY): Payer: Medicaid Other

## 2012-01-23 ENCOUNTER — Other Ambulatory Visit (HOSPITAL_COMMUNITY): Payer: Self-pay

## 2012-01-23 LAB — TRIGLYCERIDES: Triglycerides: 341 mg/dL — ABNORMAL HIGH (ref <150)

## 2012-01-23 LAB — DIFFERENTIAL
Basophils Absolute: 0.1 10*3/uL (ref 0.0–0.1)
Eosinophils Relative: 1 % (ref 0–5)
Lymphocytes Relative: 13 % (ref 12–46)
Lymphs Abs: 1.6 10*3/uL (ref 0.7–4.0)
Monocytes Relative: 8 % (ref 3–12)
Neutrophils Relative %: 77 % (ref 43–77)

## 2012-01-23 LAB — BLOOD GAS, ARTERIAL
Acid-Base Excess: 8.1 mmol/L — ABNORMAL HIGH (ref 0.0–2.0)
FIO2: 0.3 %
MECHVT: 0.58 mL
TCO2: 27.5 mmol/L (ref 0–100)
pCO2 arterial: 41.7 mmHg (ref 35.0–45.0)
pH, Arterial: 7.496 — ABNORMAL HIGH (ref 7.350–7.450)
pO2, Arterial: 79 mmHg — ABNORMAL LOW (ref 80.0–100.0)

## 2012-01-23 LAB — COMPREHENSIVE METABOLIC PANEL
ALT: 20 U/L (ref 0–53)
CO2: 32 mEq/L (ref 19–32)
Calcium: 8.6 mg/dL (ref 8.4–10.5)
Creatinine, Ser: 0.95 mg/dL (ref 0.50–1.35)
GFR calc Af Amer: >90 mL/min (ref >90)
GFR calc non Af Amer: >90 mL/min (ref >90)
Glucose, Bld: 154 mg/dL — ABNORMAL HIGH (ref 70–99)
Sodium: 150 mEq/L — ABNORMAL HIGH (ref 135–145)
Total Protein: 6.5 g/dL (ref 6.0–8.3)

## 2012-01-23 LAB — GLUCOSE, CAPILLARY: Glucose-Capillary: 143 mg/dL — ABNORMAL HIGH (ref 70–99)

## 2012-01-23 LAB — CBC
MCV: 95.3 fL (ref 78.0–100.0)
Platelets: 361 10*3/uL (ref 150–400)
RBC: 4.26 MIL/uL (ref 4.22–5.81)
RDW: 13.8 % (ref 11.5–15.5)
WBC: 12.4 10*3/uL — ABNORMAL HIGH (ref 4.0–10.5)

## 2012-01-23 LAB — CHOLESTEROL, TOTAL: Cholesterol: 123 mg/dL (ref 0–200)

## 2012-01-23 LAB — MAGNESIUM: Magnesium: 2.9 mg/dL — ABNORMAL HIGH (ref 1.5–2.5)

## 2012-01-23 LAB — PHOSPHORUS: Phosphorus: 3.3 mg/dL (ref 2.3–4.6)

## 2012-01-23 LAB — PREALBUMIN: Prealbumin: 5.5 mg/dL — ABNORMAL LOW (ref 17.0–34.0)

## 2012-01-23 MED ORDER — ZINC TRACE METAL 1 MG/ML IV SOLN
INTRAVENOUS | Status: DC
  Filled 2012-01-23: qty 2000

## 2012-01-23 MED ORDER — FENTANYL CITRATE 0.05 MG/ML IJ SOLN
25.0000 ug | INTRAMUSCULAR | Status: DC | PRN
  Administered 2012-01-25 – 2012-01-26 (×3): 50 ug via INTRAVENOUS
  Administered 2012-01-26 (×2): 25 ug via INTRAVENOUS
  Administered 2012-01-26 – 2012-01-31 (×27): 50 ug via INTRAVENOUS
  Filled 2012-01-23 (×34): qty 2

## 2012-01-23 MED ORDER — FENTANYL CITRATE 0.05 MG/ML IJ SOLN
50.0000 ug | Freq: Once | INTRAMUSCULAR | Status: AC
  Administered 2012-01-23 – 2012-01-24 (×2): 50 ug via INTRAVENOUS

## 2012-01-23 MED ORDER — POTASSIUM CHLORIDE 20 MEQ/15ML (10%) PO LIQD
30.0000 meq | Freq: Three times a day (TID) | ORAL | Status: DC
  Administered 2012-01-23 (×2): 30 meq
  Administered 2012-01-24: 16:00:00
  Administered 2012-01-24 – 2012-01-26 (×7): 30 meq
  Filled 2012-01-23 (×16): qty 22.5

## 2012-01-23 MED ORDER — FREE WATER
200.0000 mL | Status: DC
  Administered 2012-01-23 – 2012-01-26 (×16): 200 mL

## 2012-01-23 MED ORDER — FUROSEMIDE 10 MG/ML IJ SOLN
40.0000 mg | Freq: Three times a day (TID) | INTRAMUSCULAR | Status: DC
  Filled 2012-01-23 (×3): qty 4

## 2012-01-23 MED ORDER — VITAL AF 1.2 CAL PO LIQD
1000.0000 mL | ORAL | Status: DC
  Administered 2012-01-23 – 2012-01-24 (×3): 1000 mL
  Filled 2012-01-23 (×4): qty 1000

## 2012-01-23 MED ORDER — DEXTROSE 5 % IV SOLN
INTRAVENOUS | Status: DC
  Administered 2012-01-23: 20 mL via INTRAVENOUS
  Administered 2012-01-26: 500 mL via INTRAVENOUS
  Administered 2012-01-28: 16:00:00 via INTRAVENOUS

## 2012-01-23 MED ORDER — IOHEXOL 300 MG/ML  SOLN
100.0000 mL | Freq: Once | INTRAMUSCULAR | Status: AC | PRN
  Administered 2012-01-23: 100 mL via INTRAVENOUS

## 2012-01-23 MED ORDER — HYDRALAZINE HCL 20 MG/ML IJ SOLN
10.0000 mg | Freq: Four times a day (QID) | INTRAMUSCULAR | Status: DC | PRN
  Administered 2012-01-23: 10 mg via INTRAVENOUS
  Filled 2012-01-23: qty 1

## 2012-01-23 MED ORDER — POTASSIUM CHLORIDE 10 MEQ/50ML IV SOLN
10.0000 meq | INTRAVENOUS | Status: AC
  Administered 2012-01-23 (×3): 10 meq via INTRAVENOUS
  Filled 2012-01-23: qty 200

## 2012-01-23 MED ORDER — FENTANYL CITRATE 0.05 MG/ML IJ SOLN
INTRAMUSCULAR | Status: AC
  Filled 2012-01-23: qty 2

## 2012-01-23 MED ORDER — MIDAZOLAM HCL 5 MG/ML IJ SOLN: 2.0000 mg | INTRAMUSCULAR | Status: DC | PRN

## 2012-01-23 MED ORDER — DEXMEDETOMIDINE HCL IN NACL 400 MCG/100ML IV SOLN
0.2000 ug/kg/h | INTRAVENOUS | Status: DC
  Administered 2012-01-24 (×3): 0.4 ug/kg/h via INTRAVENOUS
  Administered 2012-01-24: 0.6 ug/kg/h via INTRAVENOUS
  Administered 2012-01-25: 0.4 ug/kg/h via INTRAVENOUS
  Administered 2012-01-25: 0.5 ug/kg/h via INTRAVENOUS
  Administered 2012-01-25: 0.6 ug/kg/h via INTRAVENOUS
  Filled 2012-01-23 (×7): qty 100

## 2012-01-23 MED ORDER — JEVITY 1.2 CAL PO LIQD
1000.0000 mL | ORAL | Status: DC
  Administered 2012-01-23 (×2): 1000 mL

## 2012-01-23 MED ORDER — ZINC TRACE METAL 1 MG/ML IV SOLN
INTRAVENOUS | Status: AC
  Administered 2012-01-23: 18:00:00 via INTRAVENOUS
  Filled 2012-01-23: qty 2000

## 2012-01-23 MED ORDER — INSULIN GLARGINE 100 UNIT/ML ~~LOC~~ SOLN
10.0000 [IU] | Freq: Every day | SUBCUTANEOUS | Status: DC
  Administered 2012-01-23 – 2012-01-28 (×6): 10 [IU] via SUBCUTANEOUS

## 2012-01-23 MED ORDER — FENTANYL CITRATE 0.05 MG/ML IJ SOLN
50.0000 ug | Freq: Once | INTRAMUSCULAR | Status: AC
  Administered 2012-01-23: 50 ug via INTRAVENOUS
  Filled 2012-01-23: qty 2

## 2012-01-23 MED ORDER — DEXMEDETOMIDINE HCL IN NACL 200 MCG/50ML IV SOLN: 0.2000 ug/kg/h | INTRAVENOUS | Status: DC

## 2012-01-23 MED ORDER — SODIUM CHLORIDE 0.9 % IV SOLN
100.0000 mg | Freq: Every day | INTRAVENOUS | Status: AC
  Administered 2012-01-23 – 2012-01-30 (×8): 100 mg via INTRAVENOUS
  Filled 2012-01-23 (×8): qty 100

## 2012-01-23 MED ORDER — FUROSEMIDE 10 MG/ML IJ SOLN
40.0000 mg | Freq: Once | INTRAMUSCULAR | Status: AC
  Administered 2012-01-23: 40 mg via INTRAVENOUS
  Filled 2012-01-23: qty 4

## 2012-01-23 NOTE — Progress Notes (Addendum)
General Surgery Note  LOS: 7 days  POD# 6 Room - 1233  Assessment/Plan: 1.  PARTIAL COLECTOMY, COLOSTOMY - T. Cornett - 01/17/2012  Diverticulitis  On Zosyn  VAC wound dressing - changed MWF  To start TF  CT shows left abdominal abscess - to perc drain tomorrow - 10/8.  2.  UTI (lower urinary tract infection)  3.  Obesity, Class III, BMI 40-49.9 (morbid obesity)  4.  Acute respiratory failure  Remains on vent. 5.  Obtunded.  Does not respond to deep pain.  Has been off sedation meds x 24 hours.  Discussed with Dr. Craige Cotta  CT scan shows no acute cranial injury. 5.  Tobacco abuse  6.  Hypertriglyceridemia  7.  Nutrition  On TPN   Will start tube feedings slowly. 8.  DVT proph  On Lovenox 9.  Hypokalemia  K+ - 3.3 - 01/23/2012.  Subjective:  Intubated and not responding.  Parents are at the bedside.  I talked to them about findings. Objective:   Filed Vitals:   01/23/12 0800  BP:   Pulse:   Temp: 102.5 F (39.2 C)  Resp:      Intake/Output from previous day:  10/06 0701 - 10/07 0700 In: 1684.5 [I.V.:150; NG/GT:200; IV Piggyback:374.5; TPN:960] Out: 4425 [Urine:3575; Emesis/NG output:550; Drains:150; Stool:150]  Intake/Output this shift:  Total I/O In: 50 [IV Piggyback:50] Out: 145 [Urine:145]   Physical Exam:   General: Obese, bearded WM. Intubated.  Does not respond.   HEENT: Normal. Pupils equal. .   Lungs: Some mild rhonchi.   Abdomen: Obese.  Soft.  Rare BS.  Ostomy LUQ with stool.   Wound: VAC on midline wound     Lab Results:    Basename 01/23/12 0420 01/22/12 0550  WBC 12.4* 8.3  HGB 13.1 11.7*  HCT 40.6 37.5*  PLT 361 329    BMET   Basename 01/23/12 0420 01/22/12 0550  NA 150* 149*  K 3.3* 3.6  CL 108 107  CO2 32 34*  GLUCOSE 154* 146*  BUN 27* 29*  CREATININE 0.95 1.01  CALCIUM 8.6 8.2*    PT/INR  No results found for this basename: LABPROT:2,INR:2 in the last 72 hours  ABG   Basename 01/23/12 0355 01/22/12 0408  PHART 7.496* 7.442   HCO3 31.6* 33.4*     Studies/Results:  Dg Chest Port 1 View  01/23/2012  *RADIOLOGY REPORT*  Clinical Data: 41 year old male endotracheal tube placement. Respiratory failure.  PORTABLE CHEST - 1 VIEW  Comparison: 01/22/2012 and earlier.  Findings: Portable semi upright AP view 0458 hours.  Endotracheal tube tip is stable the level of clavicles.  Stable left subclavian central line. Lower lung volumes.  Patchy perihilar and basilar opacity most resembles atelectasis.  No pneumothorax, pulmonary edema or pleural effusion.  Stable cardiac size and mediastinal contours.  IMPRESSION: 1. Stable lines and tubes. 2.  Very low lung volumes with perihilar and basilar opacity which most resembles atelectasis.   Original Report Authenticated By: Harley Hallmark, M.D.    Dg Chest Port 1 View  01/22/2012  *RADIOLOGY REPORT*  Clinical Data: Endotracheal placement.  PORTABLE CHEST - 1 VIEW  Comparison: 10/05  Findings: Endotracheal tube has its tip 4 cm above the carina. Nasogastric tube enters the abdomen. Left subclavian central line has its tip at the SVC/RA junction.  The Volume loss of both lower lobes persists, little changed allowing for technical factors.  IMPRESSION: No significant change since yesterday.  Basilar atelectasis.   Original Report  Authenticated By: Thomasenia Sales, M.D.      Anti-infectives:   Anti-infectives     Start     Dose/Rate Route Frequency Ordered Stop   01/16/12 1800  piperacillin-tazobactam (ZOSYN) IVPB 3.375 g       3.375 g 12.5 mL/hr over 240 Minutes Intravenous 3 times per day 01/16/12 1721 01/27/12 0930          Ovidio Kin, MD, FACS Pager: [phone],   [facility] Office: [phone] 01/23/2012

## 2012-01-23 NOTE — Progress Notes (Signed)
CARE MANAGEMENT NOTE 01/23/2012  Patient:  JAMILLE, YOSHINO   Account Number:  [id number]  Date Initiated:  01/17/2012  Documentation initiated by:  PEELE,SUZANNE  Subjective/Objective Assessment:   41 yo male admitted with bowel obstruction, to OR for resection 10/1. PTA lived at home with spouse.     Action/Plan:   from home exp lap and colostomy on [id number] transferred to icu and intubated [id number].   Anticipated DC Date:  01/26/2012   Anticipated DC Plan:  HOME/SELF CARE      DC Planning Services  CM consult      Choice offered to / List presented to:             Status of service:  In process, will continue to follow Medicare Important Message given?   (If response is "NO", the following Medicare IM given date fields will be blank) Date Medicare IM given:   Date Additional Medicare IM given:    Discharge Disposition:    Per UR Regulation:  Reviewed for med. necessity/level of care/duration of stay  If discussed at Long Length of Stay Meetings, dates discussed:    Comments:  [id number]/Jamaiyah Pyle Earlene Plater, RN, BSN, CCM: CHART REVIEWED AND UPDATED. NO DISCHARGE NEEDS PRESENT AT THIS TIME. CASE MANAGEMENT [phone]   [id number]/Mallori Araque,RN,BSN,CCM: Patient taken to or on [id number] for abd pain-obstruction versus perforation, colectomy with colostomy required, post op patient became hypotensive requiring iv fld blous transferred to icu for monitoring.

## 2012-01-23 NOTE — Progress Notes (Signed)
Name: [name] MRN: [id number] DOB: 1971-01-02    LOS: 7  Referring Provider:  CCS Reason for Referral:  Acute respiratory failure  PULMONARY / CRITICAL CARE MEDICINE  Brief patient description:  41 yo morbidly obese male with suspected OSA/OHS admitted 9/30 with perf bowel due to stricture, + peritonitis, s/p exploratory laparotomy with partial colectomy and colostomy (10/1).  The post op course has been complicated by hypoxemic respiratory failure requiring intubation.  Events Since Admission: 9/30  Admitted with abdominal pain, nausea and vomiting 10/1  Exploratory laparotomy, partial colectomy, colostomy; peritoneal abscess noted 10/2  Intubated for hypoxemic respiratory failure  Current Status: Not tolerating PSV well today   Vital Signs: Temp:  [99 F (37.2 C)-102.5 F (39.2 C)] 102.5 F (39.2 C) (10/07 0800) Pulse Rate:  [93-114] 111  (10/07 1000) Resp:  [19-34] 21  (10/07 1000) BP: (121-166)/(81-110) 140/100 mmHg (10/07 1000) SpO2:  [93 %-97 %] 95 % (10/07 1000) FiO2 (%):  [30 %-40 %] 30 % (10/07 0858) Weight:  [125.8 kg (277 lb 5.4 oz)] 125.8 kg (277 lb 5.4 oz) (10/07 0000)  Physical Examination:  Gen: obese, sedated on vent, comfortable HEENT: NCAT, PERRL, ETT in place PULM: scattered rhonchi CV: RRR, no mgr AB: BS+ (infrequent), wound vac and colostomy in place Ext: warm, scds, trace edema Neuro: Sedated on vent, localizing vs posturing   Principal Problem:  *Colon obstruction Active Problems:  UTI (lower urinary tract infection)  Diverticulitis of colon without hemorrhage  Obesity, Class III, BMI 40-49.9 (morbid obesity)  Acute respiratory failure with hypoxia  Acute pulmonary edema  Encephalopathy acute  Tobacco abuse  Intra-abdominal abscess, diverticular s/p surgical drainage  Hypertriglyceridemia  ASSESSMENT AND PLAN  PULMONARY  Lab 01/23/12 0355 01/22/12 0408 01/21/12 0421 01/20/12 0422 01/19/12 1055  PHART 7.496* 7.442 7.433 7.407  7.383  PCO2ART 41.7 50.6* 50.0* 52.3* 49.7*  PO2ART 79.0* 94.4 82.1 105.0* 119.0*  HCO3 31.6* 33.4* 32.4* 31.9* 28.2*  O2SAT 95.1 96.4 95.3 97.6 98.0   Ventilator Settings: Vent Mode:  [-] PRVC FiO2 (%):  [30 %-40 %] 30 % Set Rate:  [14 bmp] 14 bmp Vt Set:  [580 mL] 580 mL PEEP:  [5 cmH20] 5 cmH20 Plateau Pressure:  [11 cmH20-23 cmH20] 11 cmH20  CXR:  10/7>>> Hypoinflation, bilateral airspace disease, bibasilar atelectasis  ETT:  10/2 >>>  A:   1) Acute hypoxemic / hypercarbic respiratory failure secondary to acute pulmonary edema , ALI and  Atelectasis. 3) Suspected OSA/OHS. CXR still w/ bilateral atelectasis, still w/ bilateral airspace disease. His vent mechanics are a little worse when comparing weaning efforts 3 days prior. His decreased mental status is also a barrier to weaning.  P:   Push diuresis Daily CXR/ABG. Daily SBT. Combivent prn. Minimize all sedation efforts. If needs sedation will place on precedex   CARDIOVASCULAR  Lab 01/21/12 0520 01/19/12 1850 01/19/12 1035 01/19/12 0503 01/19/12 0441 01/18/12 2322 01/18/12 2317 01/18/12 2006 01/18/12 2005  TROPONINI -- <0.30 <0.30 <0.30 -- <0.30 -- -- --  LATICACIDVEN -- -- 1.8 -- 2.1 -- 2.2 2.3* --  PROBNP 82.8 -- -- -- -- -- -- -- 364.1*   Lines: R Ventress TLC 10/2 >>>  A: Hemodynamically stable.   P:  Goal MAP > 60  RENAL  Lab 01/23/12 0420 01/22/12 0550 01/21/12 0520 01/20/12 0437 01/18/12 2322  NA 150* 149* 146* 145 141  K 3.3* 3.6 -- -- --  CL 108 107 105 106 107  CO2 32 34* 33* 32  26  BUN 27* 29* 25* 24* 15  CREATININE 0.95 1.01 1.01 1.11 1.01  CALCIUM 8.6 8.2* 8.5 8.4 8.0*  MG 2.9* 2.8* 2.5 -- --  PHOS 3.3 2.9 3.1 -- --   Intake/Output      10/06 0701 - 10/07 0700 10/07 0701 - 10/08 0700   I.V. (mL/kg) 150 (1.2)    Other     NG/GT 200    IV Piggyback 374.5 112.5   TPN 960 120   Total Intake(mL/kg) 1684.5 (13.4) 232.5 (1.8)   Urine (mL/kg/hr) 3575 (1.2) 335 (0.6)   Emesis/NG output 550    Drains  150    Stool 150    Total Output 4425 335   Net -2740.5 -102.5          Intake/Output Summary (Last 24 hours) at 01/23/12 1113 Last data filed at 01/23/12 1000  Gross per 24 hour  Intake   1642 ml  Output   4460 ml  Net  -2818 ml   Foley:  10/1 >>>  A:   1) Clinically volume overloaded. 2) Hypernatremia 3) hypokalemia  P:   Free water for hypernatremia. Replace K. Continue lasix  GASTROINTESTINAL  Lab 01/23/12 0420 01/21/12 0520 01/18/12 2322 01/18/12 01/17/12 0353  AST 34 22 22 15 15   ALT 20 13 14 15 18   ALKPHOS 64 72 66 73 102  BILITOT 0.5 0.4 0.7 0.7 0.4  PROT 6.5 6.2 5.2* 5.5* 6.8  ALBUMIN 2.0* 1.9* 1.9* 2.2* 2.8*   A:  Status post exploratory lap, partial colectomy, colostomy. P:   Started TF and if tolerated would d/c TPN soon per CCS Post op care per CCS.  HEMATOLOGIC  Lab 01/23/12 0420 01/22/12 0550 01/21/12 0520 01/20/12 0437 01/18/12 2322  HGB 13.1 11.7* 12.9* 12.3* 14.7  HCT 40.6 37.5* 40.0 38.3* 44.7  PLT 361 329 378 364 421*  INR -- -- -- -- --  APTT -- -- -- -- --   A:   1) Hemoconcentration? Got diuresed negative almost 3 liters.  P:  Trend CBC   INFECTIOUS  Lab 01/23/12 0420 01/22/12 0550 01/21/12 0520 01/20/12 0437 01/19/12 0503 01/18/12 2322 01/18/12 2005  WBC 12.4* 8.3 10.5 8.3 -- 16.7* --  PROCALCITON -- -- -- 1.54 2.37 -- 2.43   Cultures: Blood culture x2 10/3>>>NTD  Antibiotics: Zosyn 9/30 >>> Micafungin 10/07>>  A:  Peritonitis, diverticular abscess with persistent fevers. P:   Antibiotics / cultures as above F/u CT abd/pelvis  ENDOCRINE  Lab 01/23/12 0728 01/22/12 1910 01/22/12 1518 01/22/12 1152 01/22/12 0733  GLUCAP 163* 155* 131* 129* 137*   A:  Hyperglycemia. Glycemic control P:   Goal CBG 120 to 180 SSI  NEUROLOGIC  A:  Acute encephalopathy secondary to sedation.  Post op pain. His MS is worse than on exam in prior week. Has not has any PRN sedation in over 24hrs.  P:   CT head Continue supportive  care Hold LMWH until CT head back  BEST PRACTICE / DISPOSITION Level of Care:  ICU Primary Service:  CCS Consultants:  PCCM Code Status:  Full Diet:  NPO DVT Px:  Protonix GI Px:  Lovenox Skin Integrity:  Intact Social / Family:  Updated family at bedside  Reviewed above, examined pt, and agree with assessment/plan.  He has persistent fever.  He has poor mental status in spite of being off sedatives.  He did not tolerate SBT.  Will plan CT head, abd/pelvis.  Add empiric anti-fungal coverage.  Updated family  at bedside about plan.  Critical care time 40 minutes.  Coralyn Helling, MD [facility] Pulmonary/Critical Care 01/23/2012, 12:58 PM Pager:  [phone] After 3pm call: [phone]

## 2012-01-23 NOTE — Progress Notes (Signed)
eLink Physician-Brief Progress Note Patient Name: [name] DOB: Jun 16, 1970 MRN: [id number]  Date of Service  01/23/2012   HPI/Events of Note  hypokalemia   eICU Interventions  Potassium replaced   Intervention Category Intermediate Interventions: Electrolyte abnormality - evaluation and management  [name] 01/23/2012, 5:19 AM

## 2012-01-23 NOTE — Significant Event (Signed)
Pt agitated with increased RR.  Had difficulty with mental status, but CT head earlier today normal.  Will give fentanyl 50 mcg IV x one and monitor.  Coralyn Helling, MD 01/23/2012, 9:46 PM [phone]

## 2012-01-23 NOTE — Significant Event (Signed)
Pt with elevated SBP and DBP.  Will give prn hydralazine.  Coralyn Helling, MD 01/23/2012, 5:16 PM [phone]

## 2012-01-23 NOTE — Progress Notes (Signed)
Dr Bard Herbert on call for CCM-updated re: decreased LOC.  No movement to sternal rub or nailbed pressure.

## 2012-01-23 NOTE — Progress Notes (Signed)
eLink Physician-Brief Progress Note Patient Name: [name] DOB: 07-28-1970 MRN: [id number]  Date of Service  01/23/2012   HPI/Events of Note  Continued issues of breath stacking, hypertension and tachycardia responded briefly to intermittent doses of fentanyl.  Note earlier on rounds today stated plan for use of precedex if agitation continued   eICU Interventions  Plan: Start precedex protocol for adequate sedation while vented.   Intervention Category Intermediate Interventions: Respiratory distress - evaluation and management Minor Interventions: Agitation / anxiety - evaluation and management  DETERDING,[name] 01/23/2012, 11:53 PM

## 2012-01-23 NOTE — Consult Note (Addendum)
WOC Wound Consult Note Reason for Consult:VAC Dressing Change Wound type:Surgical Pressure Ulcer POA:No Measurement:28 x 5 x 4.5cm Wound bed:Red, moist, free of necrotic tissue Drainage (amount, consistency, odor)  Periwound:serosanguinous Dressing procedure/placement/frequency:VAC changed without incident: 125 mmHg negative pressure (continuous) tolerated without change in VS, seal achieved.  Ostomy pouch leaking and changed at same time.  See below.  WOC ostomy consult  Stoma type/location: LLQ end stoma Stomal assessment/size: 1 and 1/2 inch oval opening, stoma now below skin level. Functioning Peristomal assessment: Intact, but reddened. Stool found to be pancaking onto skin circumferentially from stomal opening Treatment options for stomal/peristomal skin: None indicated. Output thick, pasty light brown stool. Ostomy pouching: 1pc. With convexity and barrier ring.  It is noted that MD desired 2-pc pouching for stoma observation, but patient requires 1 piece with convexity for stool management: protection of skin and to keep stool out of VAC dressing/wound.  Extra supplies at bedside should MD wish to remove pouch for stomal inspection.  RN can replace pouch.  Pouch ([place]# [phone]) Corporate treasurer ([place]# [phone])  Education provided: None.  Patient's mother left the bedside when I arrived and patient is not responsive.    Our WOC Team will follow with you. Thanks, Ladona Mow, MSN, RN, [facility], CWOCN [phone])

## 2012-01-23 NOTE — Progress Notes (Addendum)
Nutrition Follow-up- Consult for TF per Nutrition  Intervention:    Change TF to Vital AF 1.2 at 20 ml/hr.  Increase 10 ml every 8 hours to goal of 65 ml/hr to provide 1872 kcal, 117 gm protein per day. (65% est kcal needs, 98% est protein needs to meet aspen guidelines.)  Monitor TPN with pharmacy  Assessment:   Pt 6 days post op partial colectomy and colostomy. Wound VAC in place.  Colostomy with small amount output.  Tolerating TF. Temp and vent needs high increasing caloric needs.  TF:  Jevity 1.2 at 20 ml/hr tolerating well and providing 576 kcals, 27 gm protein  TPN:   Clinimix E 5/20 at 40 ml/hr currently providing 845 kcal, 48 gm protein per day   TPN:  To change to Clinimix E 5/15 at 60 ml/hr with tonight's bag to provide:  1022 kcal and 72 gm protein daily.  Due to increased Na, Magnesium.  Lipids held due to increased triglycerides and transition to TF.    Diet Order:  NPO  Meds: Scheduled Meds:   . albuterol-ipratropium  6 puff Inhalation Q4H  . antiseptic oral rinse  15 mL Mouth Rinse QID  . chlorhexidine  15 mL Mouth Rinse BID  . enoxaparin  40 mg Subcutaneous Q24H  . free water  200 mL Per Tube Q6H  . furosemide  40 mg Intravenous Q6H  . insulin aspart  0-15 Units Subcutaneous Q4H  . lip balm  1 application Topical BID  . metoprolol  5 mg Intravenous Q6H  . nicotine  21 mg Transdermal Daily  . pantoprazole (PROTONIX) IV  40 mg Intravenous QHS  . piperacillin-tazobactam (ZOSYN)  IV  3.375 g Intravenous Q8H  . potassium chloride  10 mEq Intravenous Q1 Hr x 4  . potassium chloride  10 mEq Intravenous Q1 Hr x 4  . DISCONTD: acetaminophen  1,000 mg Intravenous Q6H   Continuous Infusions:   . dextrose 5 % and 0.9 % NaCl with KCl 20 mEq/L 20 mL/hr (01/22/12 1000)  . feeding supplement (JEVITY 1.2 CAL) 1,000 mL (01/23/12 1014)  . TPN (CLINIMIX) +/- additives 40 mL/hr at 01/21/12 1814  . TPN (CLINIMIX) +/- additives 40 mL/hr at 01/22/12 1707  . TPN (CLINIMIX) +/-  additives    . DISCONTD: fentaNYL infusion INTRAVENOUS 100 mcg/hr (01/21/12 0824)  . DISCONTD: midazolam (VERSED) infusion 2 mg/hr (01/21/12 2326)   PRN Meds:.acetaminophen (TYLENOL) oral liquid 160 mg/5 mL, albuterol-ipratropium, alum & mag hydroxide-simeth, fentaNYL, magic mouthwash, menthol-cetylpyridinium, midazolam, ondansetron, phenol, DISCONTD: fentaNYL, DISCONTD: midazolam  Labs:  CMP     Component Value Date/Time   NA 150* 01/23/2012 0420   K 3.3* 01/23/2012 0420   CL 108 01/23/2012 0420   CO2 32 01/23/2012 0420   GLUCOSE 154* 01/23/2012 0420   BUN 27* 01/23/2012 0420   CREATININE 0.95 01/23/2012 0420   CALCIUM 8.6 01/23/2012 0420   PROT 6.5 01/23/2012 0420   ALBUMIN 2.0* 01/23/2012 0420   AST 34 01/23/2012 0420   ALT 20 01/23/2012 0420   ALKPHOS 64 01/23/2012 0420   BILITOT 0.5 01/23/2012 0420   GFRNONAA >90 01/23/2012 0420   GFRAA >90 01/23/2012 0420  Phos 3.3 Magnesium:  2.9 Triglycerides:  341- decreased but remain elevated.  Intake/Output Summary (Last 24 hours) at 01/23/12 1123 Last data filed at 01/23/12 1000  Gross per 24 hour  Intake   1642 ml  Output   4460 ml  Net  -2818 ml    Weight Status:  125.8  kg  Re-estimated needs:  2860 kcal, 120-140 gm protein  Nutrition Dx:  Inadequate oral intake--ongoing  Goal:  Enteral nutrition to provide 60-70% of estimated calorie needs (22-25 kcals/kg ideal body weight) and >/= 90% of estimated protein needs, based on ASPEN guidelines for permissive underfeeding in critically ill obese individuals.   Monitor:  TPN with pharmacy, transition to TF   Oran Rein, RD, LDN Clinical Inpatient Dietitian Pager:  [phone] Weekend and after hours pager:  [phone]

## 2012-01-23 NOTE — Progress Notes (Signed)
PARENTERAL NUTRITION CONSULT NOTE - FOLLOW UP  Pharmacy Consult for TNA  Indication: s/p massive bowel resection  No Known Allergies  Patient Measurements: Height: 5\' 10"  (177.8 cm) Weight: 277 lb 5.4 oz (125.8 kg) IBW/kg (Calculated) : 73  Adjusted Body Weight: 99 kg  Noted 40lb wt loss pre-op (remains obese)  Vital Signs: Temp: 102.5 F (39.2 C) (10/07 0800) Temp src: Axillary (10/07 0800) BP: 134/87 mmHg (10/07 0700) Pulse Rate: 110  (10/07 0700) Intake/Output from previous day: 10/06 0701 - 10/07 0700 In: 1684.5 [I.V.:150; NG/GT:200; IV Piggyback:374.5; TPN:960] Out: 4425 [Urine:3575; Emesis/NG output:550; Drains:150; Stool:150] Intake/Output from this shift: Total I/O In: 192.5 [IV Piggyback:112.5; TPN:80] Out: 145 [Urine:145]  Labs:  [facility] 01/23/12 0420 01/22/12 0550 01/21/12 0520  WBC 12.4* 8.3 10.5  HGB 13.1 11.7* 12.9*  HCT 40.6 37.5* 40.0  PLT 361 329 378  APTT -- -- --  INR -- -- --    Basename 01/23/12 0420 01/22/12 0550 01/21/12 0520  NA 150* 149* 146*  K 3.3* 3.6 3.8  CL 108 107 105  CO2 32 34* 33*  GLUCOSE 154* 146* 92  BUN 27* 29* 25*  CREATININE 0.95 1.01 1.01  LABCREA -- -- --  CREAT24HRUR -- -- --  CALCIUM 8.6 8.2* 8.5  MG 2.9* 2.8* 2.5  PHOS 3.3 2.9 3.1  PROT 6.5 -- 6.2  ALBUMIN 2.0* -- 1.9*  AST 34 -- 22  ALT 20 -- 13  ALKPHOS 64 -- 72  BILITOT 0.5 -- 0.4  BILIDIR -- -- --  IBILI -- -- --  PREALBUMIN -- -- --  TRIG 341* -- 422*  CHOLHDL -- -- --  CHOL 123 -- 696   Estimated Creatinine Clearance: 137.6 ml/min (by C-G formula based on Cr of 0.95).    Basename 01/23/12 0728 01/22/12 1910 01/22/12 1518  GLUCAP 163* 155* 131*   CBGs & Insulin requirements past 24 hours:  CBGs 121-163, required 13 units SSI sensitive   Nutritional Goals:  RD recs 10/4: 2500 kcal, 90-115 g protein per day Clinimix E 5/20 @ 156ml/hr and IVFE 20% @ 10 ml/hr on MWF will provide 120 g protein/day and an average of 2318 kcal/day (2592 kcal on  lipid days, 2112 kcal on nonlipid days). Meeting 93% Kcal needs and >100% protein needs.  Current nutrition:  Jevity 1.2 cal @ 20 ml/hr (starting 10/7 AM)  480 ml per 24 hours will provide 576 Kcal, 27 g protein/day TNA: Clinimix E 5/20 @ 40 ml/hr MIVF: D5NS + 20 mEq/L KCl @ KVO, Free water 200 ml PT q6h  Assessment:   41 yom presented 9/30 with abd pain x 1 month, N/V. CT w/ stricture of descending colon with colonic obstruction. 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 Complicated by hypoxemic respiratory failure requiring intubation 10/2. TNA started 10/4. Per RD note, pt reports poor PO intake prior to admission, nausea, dry heaves, and lack of appetite. Pt reports 29 lb unintentional weight loss in the past month.  MD started TF with Jevity at rate of 20 ml/hr 10/7 AM - f/u residual.   Due to increased Na, Mag will remove electrolytes from TNA today (1L of Clinimix E will provide 35 mEq/L Na)  Labs Renal function: Scr wnl/stable Hepatic function: AST/ALT, Alk phos wnl Electrolytes:  Na high, 150, K low (MD repleting). Mag high, 2.9. Pre-Albumin: pending 10/7 lab TG/Cholesterol: TG elevated 10/5 (422), 10/7 down to 341  Plan:  At 1800 tonight  Change to Clinimix 5/15 (no  electrolytes) @ 60 ml/hr  TNA to contain IV fat emulsion, standard multivitamins and trace elements only on MWF only due to ongoing shortage  Will not give IV fat emulsion today and allow TG to improve. Likely can resume on Wednesday.  F/u TF toleration  TNA labs Monday/Thursdays  Pharmacy will follow up daily  Geoffry Paradise, PharmD, BCPS Pager: [phone] 10:06 AM Pharmacy #: [phone]

## 2012-01-23 NOTE — Progress Notes (Signed)
Dr Gwinda Passe notified about abdominal CT results.

## 2012-01-24 ENCOUNTER — Encounter (HOSPITAL_COMMUNITY): Payer: Self-pay | Admitting: Radiology

## 2012-01-24 ENCOUNTER — Inpatient Hospital Stay (HOSPITAL_COMMUNITY): Payer: Medicaid Other

## 2012-01-24 DIAGNOSIS — E46 Unspecified protein-calorie malnutrition: Secondary | ICD-10-CM

## 2012-01-24 LAB — COMPREHENSIVE METABOLIC PANEL
ALT: 32 U/L (ref 0–53)
AST: 47 U/L — ABNORMAL HIGH (ref 0–37)
Albumin: 2 g/dL — ABNORMAL LOW (ref 3.5–5.2)
Alkaline Phosphatase: 60 U/L (ref 39–117)
CO2: 26 mEq/L (ref 19–32)
Chloride: 108 mEq/L (ref 96–112)
GFR calc non Af Amer: >90 mL/min (ref >90)
Potassium: 3.6 mEq/L (ref 3.5–5.1)
Sodium: 146 mEq/L — ABNORMAL HIGH (ref 135–145)
Total Bilirubin: 0.8 mg/dL (ref 0.3–1.2)

## 2012-01-24 LAB — BLOOD GAS, ARTERIAL
Bicarbonate: 25 mEq/L — ABNORMAL HIGH (ref 20.0–24.0)
Patient temperature: 101.4
TCO2: 21.9 mmol/L (ref 0–100)
pCO2 arterial: 33.8 mmHg — ABNORMAL LOW (ref 35.0–45.0)
pH, Arterial: 7.489 — ABNORMAL HIGH (ref 7.350–7.450)
pO2, Arterial: 83.1 mmHg (ref 80.0–100.0)

## 2012-01-24 LAB — CBC
MCHC: 32.1 g/dL (ref 30.0–36.0)
MCV: 94.9 fL (ref 78.0–100.0)
Platelets: 309 10*3/uL (ref 150–400)
RDW: 13.8 % (ref 11.5–15.5)
WBC: 11.8 10*3/uL — ABNORMAL HIGH (ref 4.0–10.5)

## 2012-01-24 LAB — GLUCOSE, CAPILLARY
Glucose-Capillary: 130 mg/dL — ABNORMAL HIGH (ref 70–99)
Glucose-Capillary: 146 mg/dL — ABNORMAL HIGH (ref 70–99)
Glucose-Capillary: 124 mg/dL — ABNORMAL HIGH (ref 70–99)

## 2012-01-24 LAB — PROTIME-INR: INR: 1.19 (ref 0.00–1.49)

## 2012-01-24 MED ORDER — VITAL AF 1.2 CAL PO LIQD
1000.0000 mL | ORAL | Status: DC
  Administered 2012-01-24: 1000 mL
  Filled 2012-01-24: qty 1000

## 2012-01-24 MED ORDER — CLINIMIX/DEXTROSE (5/15) 5 % IV SOLN
INTRAVENOUS | Status: AC
  Administered 2012-01-24: 18:00:00 via INTRAVENOUS
  Filled 2012-01-24: qty 2000

## 2012-01-24 MED ORDER — FENTANYL CITRATE 0.05 MG/ML IJ SOLN
INTRAMUSCULAR | Status: AC
  Administered 2012-01-24: 50 ug via INTRAVENOUS
  Filled 2012-01-24: qty 2

## 2012-01-24 MED ORDER — POTASSIUM CHLORIDE 20 MEQ/15ML (10%) PO LIQD: 40.0000 meq | Freq: Once | ORAL | Status: DC

## 2012-01-24 MED ORDER — INSULIN ASPART 100 UNIT/ML ~~LOC~~ SOLN
0.0000 [IU] | SUBCUTANEOUS | Status: DC
  Administered 2012-01-24 – 2012-01-26 (×10): 3 [IU] via SUBCUTANEOUS
  Administered 2012-01-26: 2 [IU] via SUBCUTANEOUS
  Administered 2012-01-26 – 2012-01-27 (×6): 3 [IU] via SUBCUTANEOUS
  Administered 2012-01-28: 4 [IU] via SUBCUTANEOUS
  Administered 2012-01-28 (×2): 3 [IU] via SUBCUTANEOUS
  Administered 2012-01-28: 7 [IU] via SUBCUTANEOUS
  Administered 2012-01-28: 4 [IU] via SUBCUTANEOUS
  Administered 2012-01-29 (×3): 3 [IU] via SUBCUTANEOUS
  Administered 2012-01-29 (×2): 7 [IU] via SUBCUTANEOUS
  Administered 2012-01-29: 3 [IU] via SUBCUTANEOUS
  Administered 2012-01-30: 4 [IU] via SUBCUTANEOUS
  Administered 2012-01-30: 3 [IU] via SUBCUTANEOUS

## 2012-01-24 MED ORDER — MIDAZOLAM HCL 2 MG/2ML IJ SOLN
INTRAMUSCULAR | Status: AC
  Administered 2012-01-24: 1 mg via INTRAVENOUS
  Filled 2012-01-24: qty 2

## 2012-01-24 MED ORDER — FUROSEMIDE 10 MG/ML IJ SOLN
40.0000 mg | Freq: Once | INTRAMUSCULAR | Status: AC
  Administered 2012-01-24: 40 mg via INTRAVENOUS
  Filled 2012-01-24: qty 4

## 2012-01-24 NOTE — Progress Notes (Signed)
PARENTERAL NUTRITION CONSULT NOTE - FOLLOW UP  Pharmacy Consult for TNA  Indication: s/p massive bowel resection  No Known Allergies  Patient Measurements: Height: 5\' 10"  (177.8 cm) Weight: 280 lb 6.8 oz (127.2 kg) IBW/kg (Calculated) : 73  Adjusted Body Weight: 99 kg  Noted 40lb wt loss pre-op (remains obese)  Vital Signs: Temp: 103.2 F (39.6 C) (10/08 0800) Temp src: Oral (10/08 0800) BP: 99/66 mmHg (10/08 0900) Pulse Rate: 97  (10/08 0600) Intake/Output from previous day: 10/07 0701 - 10/08 0700 In: 3703.5 [I.V.:393.5; NG/GT:1660; IV Piggyback:360; TPN:1240] Out: 2485 [Urine:2185; Drains:200; Stool:100] Intake/Output from this shift: Total I/O In: 121.9 [I.V.:9.4; NG/GT:40; IV Piggyback:12.5; TPN:60] Out: 605 [Urine:255; Stool:350]  Labs:  [facility] 01/24/12 0900 01/24/12 0535 01/23/12 0420 01/22/12 0550  WBC -- 11.8* 12.4* 8.3  HGB -- 12.5* 13.1 11.7*  HCT -- 38.9* 40.6 37.5*  PLT -- 309 361 329  APTT 32 -- -- --  INR 1.19 -- -- --    Basename 01/24/12 0535 01/23/12 0420 01/22/12 0550  NA 146* 150* 149*  K 3.6 3.3* 3.6  CL 108 108 107  CO2 26 32 34*  GLUCOSE 138* 154* 146*  BUN 28* 27* 29*  CREATININE 0.87 0.95 1.01  LABCREA -- -- --  CREAT24HRUR -- -- --  CALCIUM 8.1* 8.6 8.2*  MG -- 2.9* 2.8*  PHOS -- 3.3 2.9  PROT 6.4 6.5 --  ALBUMIN 2.0* 2.0* --  AST 47* 34 --  ALT 32 20 --  ALKPHOS 60 64 --  BILITOT 0.8 0.5 --  BILIDIR -- -- --  IBILI -- -- --  PREALBUMIN -- 5.5* --  TRIG -- 341* --  CHOLHDL -- -- --  CHOL -- 123 --   Estimated Creatinine Clearance: 151.2 ml/min (by C-G formula based on Cr of 0.87).    Basename 01/24/12 0736 01/23/12 2346 01/23/12 1931  GLUCAP 130* 154* 157*   CBGs & Insulin requirements past 24 hours:  CBGs 130-157, required 18 units SSI mod and 10 units Lantus  Nutritional Goals:  RD recs 10/4: 2500 kcal, 90-115 g protein per day Clinimix E 5/20 @ 158ml/hr and IVFE 20% @ 10 ml/hr on MWF will provide 120 g  protein/day and an average of 2318 kcal/day (2592 kcal on lipid days, 2112 kcal on nonlipid days). Meeting 93% Kcal needs and >100% protein needs.  Current nutrition:  Vital AF 1.2 cal @ 20 ml/hr (starting 10/7 AM)  480 ml per 24 hours will provide 576 Kcal, 36 g protein/day  Plan increase 10 ml every 8 hours to goal of 65 ml/hr. At goal rate, will provide 1872 kcal, 117 gm protein per day TNA: Clinimix 5/15 @ 60 ml/hr MIVF: D5NS + 20 mEq/L KCl @ KVO, Free water 200 ml PT q6h  Assessment:   41 yom presented 9/30 with abd pain x 1 month, N/V. CT w/ stricture of descending colon with colonic obstruction. 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 Complicated by hypoxemic respiratory failure requiring intubation 10/2. TNA started 10/4. Per RD note, pt reports poor PO intake prior to admission, nausea, dry heaves, and lack of appetite. Pt reports 29 lb unintentional weight loss in the past month.  TF started 10/7, advance as tolerated.  TF held since midnight for perc drainage today.  Plan resume this afternoon.  Due to increased Na, Mag - electrolytes were removed from TNA on  (1L of Clinimix E will provide 35 mEq/L Na).  Na improving.  Labs Renal function: Scr wnl/stable Hepatic function: Slight elevation of AST, ALT/Alk phos wnl Electrolytes:  Na improving, 146 Pre-Albumin:  5.5 (10/7) TG/Cholesterol: TG elevated 10/5 (422), 10/7 down to 341  Plan:  At 1800 tonight  Increase Clinimix 5/15 (no electrolytes) to 70 ml/hr.  Anticipate able to add electrolytes back tomorrow.  TNA to contain IV fat emulsion, standard multivitamins and trace elements only on MWF only due to ongoing shortage  Change SSI to resistant scale, continue Lantus as ordered by MD.   F/u TF toleration and adjust TNA in AM  TNA labs Monday/Thursdays  Pharmacy will follow up daily  Geoffry Paradise, PharmD, BCPS Pager: [phone] 10:33 AM Pharmacy #: [phone]

## 2012-01-24 NOTE — Procedures (Signed)
LLQ abscess drain 12 fr Frank pus No comp

## 2012-01-24 NOTE — Progress Notes (Signed)
Patient ID: [name], male   DOB: [birth year], 41 y.o.   MRN: [id number] 7 Days Post-Op  Subjective: Pt on vent and sedated.  Parents in the room.  Tolerating his TFs.  Held right now for perc drain.  Objective: Vital signs in last 24 hours: Temp:  [98.9 F (37.2 C)-103.2 F (39.6 C)] 103.2 F (39.6 C) (10/08 0800) Pulse Rate:  [91-124] 97  (10/08 0600) Resp:  [20-31] 30  (10/08 0600) BP: (85-161)/(58-117) 99/66 mmHg (10/08 0900) SpO2:  [92 %-98 %] 96 % (10/08 0600) FiO2 (%):  [30 %] 30 % (10/08 1146) Weight:  [280 lb 6.8 oz (127.2 kg)] 280 lb 6.8 oz (127.2 kg) (10/08 0000) Last BM Date: 01/24/12  Intake/Output from previous day: 10/07 0701 - 10/08 0700 In: 3703.5 [I.V.:393.5; NG/GT:1660; IV Piggyback:360; TPN:1240] Out: 2485 [Urine:2185; Drains:200; Stool:100] Intake/Output this shift: Total I/O In: 121.9 [I.V.:9.4; NG/GT:40; IV Piggyback:12.5; TPN:60] Out: 605 [Urine:255; Stool:350]  PE: Abd: soft, +BS, ostomy with liquid stool.  Wound with VAC in place with serosang output.  Heart: mild tachy  Lab Results:   Basename 01/24/12 0535 01/23/12 0420  WBC 11.8* 12.4*  HGB 12.5* 13.1  HCT 38.9* 40.6  PLT 309 361   BMET  Basename 01/24/12 0535 01/23/12 0420  NA 146* 150*  K 3.6 3.3*  CL 108 108  CO2 26 32  GLUCOSE 138* 154*  BUN 28* 27*  CREATININE 0.87 0.95  CALCIUM 8.1* 8.6   PT/INR  Basename 01/24/12 0900  LABPROT 14.9  INR 1.19   CMP     Component Value Date/Time   NA 146* 01/24/2012 0535   K 3.6 01/24/2012 0535   CL 108 01/24/2012 0535   CO2 26 01/24/2012 0535   GLUCOSE 138* 01/24/2012 0535   BUN 28* 01/24/2012 0535   CREATININE 0.87 01/24/2012 0535   CALCIUM 8.1* 01/24/2012 0535   PROT 6.4 01/24/2012 0535   ALBUMIN 2.0* 01/24/2012 0535   AST 47* 01/24/2012 0535   ALT 32 01/24/2012 0535   ALKPHOS 60 01/24/2012 0535   BILITOT 0.8 01/24/2012 0535   GFRNONAA >90 01/24/2012 0535   GFRAA >90 01/24/2012 0535   Lipase     Component Value Date/Time   LIPASE 24 01/16/2012 1210       Studies/Results: Ct Head Wo Contrast  01/23/2012  *RADIOLOGY REPORT*  Clinical Data:  Altered mental status after abdominal surgery.  CT HEAD WITHOUT CONTRAST  Technique:  Contiguous axial images were obtained from the base of the skull through the vertex without contrast  Comparison:  None.  Findings:  The brain has a normal appearance without evidence for hemorrhage, acute infarction, hydrocephalus, or mass lesion.  There is no extra axial fluid collection.  The skull and paranasal sinuses are normal.  IMPRESSION: Normal CT of the head without contrast.   Original Report Authenticated By: Elsie Stain, M.D.    Ct Abdomen Pelvis W Contrast  01/23/2012  *RADIOLOGY REPORT*  Clinical Data: Status post colon surgery for diverticulitis. Evaluate for abscess.  CT ABDOMEN AND PELVIS WITH CONTRAST  The colon has been transected.  The proximal elongated and the of the distal colon lies in the left mid abdomen correctly adjacent to this collection.  The proximal colon is diverted, along the left transverse colon, into the left mid abdomen colostomy.  The remaining right colon is unremarkable.  The remaining left colon shows no wall thickening or inflammatory changes.  Adjacent to the collection and intermixed with small bubbles of  extraluminal air is inflamatory-type stranding.   There are mildly dilated loops of proximal small bowel with air- fluid levels.  Maximal dilation of the small bowel is 4 cm.  A discrete transition point is not seen.  The small bowel does appear thick-walled where it contacts an additional small collection that lies along the anterior left mid abdomen.  The second and separate collection measures 8.4 cm x 2.8 cm transversely and measures 9 cm in length.  It contains fluid and nondependent air.  There are small left and minimal right pleural effusions.  There is dependent atelectasis at the lung bases.  The heart is normal in size.  The liver shows mild  diffuse fatty infiltration but is otherwise unremarkable.  Normal spleen, gallbladder and pancreas. No bile duct dilation.  No adrenal masses. The kidneys and ureters are unremarkable.  The bladder is decompressed by Foley catheter. No enlarged lymph nodes.  IMPRESSION: There are collections in the left abdomen, the largest extending along the left pericolic gutter into the left upper quadrant and adjacent to the greater curvature of the stomach.  A smaller collection lies in the left anterior mid to lower abdomen.  Small bowel loops adjacent to the smaller collection appear thick-walled and there is a small bowel dilation proximal to this.  This is likely a mild focal adynamic ileus due to the small bowel wall inflammation at the level of the collection.  The other collection is in close proximity to the remaining left colon proximal anastomose the staple line.  This may potentially be leaking. The collections are consistent with abscesses in the proper clinical setting.  No other acute findings.  The lung bases show small left minimal right pleural effusions and lung base atelectasis.   Original Report Authenticated By: Domenic Moras, M.D.    Dg Chest Port 1 View  01/24/2012  *RADIOLOGY REPORT*  Clinical Data: 41 year old male with respiratory failure with hypoxia.  PORTABLE CHEST - 1 VIEW  Comparison: 01/23/2012 and earlier.  Findings: Portable semi upright AP view 0516 hours.  Stable endotracheal tube.  Stable left subclavian central line.  Stable visualized enteric tube. Continued low lung volumes, not significantly changed.  Perihilar and basilar atelectasis not significantly changed.  No pneumothorax or definite effusion.  Stable cardiac size and mediastinal contours.  IMPRESSION:  1. Stable lines and tubes. 2.  Stable low lung volumes and atelectasis.   Original Report Authenticated By: Harley Hallmark, M.D.    Dg Chest Port 1 View  01/23/2012  *RADIOLOGY REPORT*  Clinical Data: 41 year old male  endotracheal tube placement. Respiratory failure.  PORTABLE CHEST - 1 VIEW  Comparison: 01/22/2012 and earlier.  Findings: Portable semi upright AP view 0458 hours.  Endotracheal tube tip is stable the level of clavicles.  Stable left subclavian central line. Lower lung volumes.  Patchy perihilar and basilar opacity most resembles atelectasis.  No pneumothorax, pulmonary edema or pleural effusion.  Stable cardiac size and mediastinal contours.  IMPRESSION: 1. Stable lines and tubes. 2.  Very low lung volumes with perihilar and basilar opacity which most resembles atelectasis.   Original Report Authenticated By: Harley Hallmark, M.D.     Anti-infectives: Anti-infectives     Start     Dose/Rate Route Frequency Ordered Stop   01/23/12 1300   micafungin (MYCAMINE) 100 mg in sodium chloride 0.9 % 100 mL IVPB        100 mg 100 mL/hr over 1 Hours Intravenous Daily 01/23/12 1158  01/16/12 1800  piperacillin-tazobactam (ZOSYN) IVPB 3.375 g       3.375 g 12.5 mL/hr over 240 Minutes Intravenous 3 times per day 01/16/12 1721 01/27/12 0930           Assessment/Plan  1. Septic shock secondary to perforated colon 2. S/p Hartman's 3. VDRF 4. Intra-abdominal abscess 5. Febrile 6. UTI 7. PCM/TNA/TF  Plan: 1. Patient continues to be febrile, last blood cultures were check on 10-3.  Patient is running a 103.2 fever today.  May need to recheck blood cultures.  It is uncommon to run this type of fever just secondary to an intra-abdominal abscess. 2. When patient is tolerating TFs at [place], then we can dc TNA.  Unclear what rate TFs are at currently.  RN is off the floor with the patient in CT. 3. Appreciate CCM assistance 4. Cont VAC changes as scheduled 5. Routine ostomy care 6. Follow labs. 7. Cont zosyn    LOS: 8 days    OSBORNE,KELLY E 01/24/2012, 12:19 PM Pager: [phone]  CT perc drain today with over 1 liter of foul smelling fluid.  I don't think that there is a bowel injury.  In talking  to the parents.   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  I spent 15 minutes talking to the parents, who are at the bedside, explaining surgical plans for abdomen and drain.  Ovidio Kin, MD, [facility] Surgery Pager: [phone] Office phone:  [phone]

## 2012-01-24 NOTE — Progress Notes (Signed)
Name: [name] MRN: [id number] DOB: August 01, 1970    LOS: 8  Referring Provider:  CCS Reason for Referral:  Acute respiratory failure  PULMONARY / CRITICAL CARE MEDICINE  Brief patient description:  41 yo morbidly obese male with suspected OSA/OHS admitted 9/30 with perf bowel due to stricture, + peritonitis, s/p exploratory laparotomy with partial colectomy and colostomy (10/1).  The post op course has been complicated by hypoxemic respiratory failure requiring intubation.  Events Since Admission: 9/30  Admitted with abdominal pain, nausea and vomiting 10/1  Exploratory laparotomy, partial colectomy, colostomy; peritoneal abscess noted 10/2  Intubated for hypoxemic respiratory failure 10/7: persistent fevers, CT of abdomen and pelvis demonstrated large left-sided fluid collection, and the left abdomen.  Current Status: Now on Precedex, fevers persist. He is awaiting trip to interventional radiology for placement of percutaneous drain  Vital Signs: Temp:  [98.9 F (37.2 C)-103.2 F (39.6 C)] 103.2 F (39.6 C) (10/08 0800) Pulse Rate:  [91-124] 97  (10/08 0600) Resp:  [20-31] 30  (10/08 0600) BP: (85-161)/(58-117) 97/65 mmHg (10/08 0600) SpO2:  [92 %-98 %] 96 % (10/08 0600) FiO2 (%):  [30 %] 30 % (10/08 0600) Weight:  [127.2 kg (280 lb 6.8 oz)] 127.2 kg (280 lb 6.8 oz) (10/08 0000)  Physical Examination:  Gen: obese, sedated on vent HEENT: NCAT, PERRL, ETT in place PULM: scattered rhonchi CV: RRR, no mgr AB: BS+ (infrequent), wound vac and colostomy in place Ext: warm, scds, trace edema Neuro: Sedated on vent, localizing vs posturing   Principal Problem:  *Colon obstruction Active Problems:  UTI (lower urinary tract infection)  Diverticulitis of colon without hemorrhage  Obesity, Class III, BMI 40-49.9 (morbid obesity)  Acute respiratory failure with hypoxia  Acute pulmonary edema  Encephalopathy acute  Tobacco abuse  Intra-abdominal abscess, diverticular s/p  surgical drainage  Hypertriglyceridemia  ASSESSMENT AND PLAN  PULMONARY  Lab 01/24/12 0345 01/23/12 0355 01/22/12 0408 01/21/12 0421 01/20/12 0422  PHART 7.489* 7.496* 7.442 7.433 7.407  PCO2ART 33.8* 41.7 50.6* 50.0* 52.3*  PO2ART 83.1 79.0* 94.4 82.1 105.0*  HCO3 25.0* 31.6* 33.4* 32.4* 31.9*  O2SAT 95.1 95.1 96.4 95.3 97.6   Ventilator Settings: Vent Mode:  [-] PRVC FiO2 (%):  [30 %] 30 % Set Rate:  [14 bmp] 14 bmp Vt Set:  [580 mL] 580 mL PEEP:  [5 cmH20] 5 cmH20 Plateau Pressure:  [20 cmH20-24 cmH20] 21 cmH20  CXR:  10/8>>> Hypoinflation, bilateral airspace disease, bibasilar atelectasis no significant change ETT:  10/2 >>>  A:   1) Acute hypoxemic / hypercarbic respiratory failure secondary to acute pulmonary edema , ALI and  Atelectasis. 2) Suspected OSA/OHS. CXR w/ bilateral atelectasis, still w/ bilateral airspace disease. Think ongoing infection his likely his major barrier to diuresis P:   Continue Precedex Daily assessment for weaning Keep fluid balance even or negative  CARDIOVASCULAR  Lab 01/21/12 0520 01/19/12 1850 01/19/12 1035 01/19/12 0503 01/19/12 0441 01/18/12 2322 01/18/12 2317 01/18/12 2006 01/18/12 2005  TROPONINI -- <0.30 <0.30 <0.30 -- <0.30 -- -- --  LATICACIDVEN -- -- 1.8 -- 2.1 -- 2.2 2.3* --  PROBNP 82.8 -- -- -- -- -- -- -- 364.1*   Lines: R Henriette TLC 10/2 >>>  A: Hemodynamically stable.   P:  Goal MAP > 60  RENAL  Lab 01/24/12 0535 01/23/12 0420 01/22/12 0550 01/21/12 0520 01/20/12 0437  NA 146* 150* 149* 146* 145  K 3.6 3.3* -- -- --  CL 108 108 107 105 106  CO2 26  32 34* 33* 32  BUN 28* 27* 29* 25* 24*  CREATININE 0.87 0.95 1.01 1.01 1.11  CALCIUM 8.1* 8.6 8.2* 8.5 8.4  MG -- 2.9* 2.8* 2.5 --  PHOS -- 3.3 2.9 3.1 --   Intake/Output      10/07 0701 - 10/08 0700 10/08 0701 - 10/09 0700   I.V. (mL/kg) 393.5 (3.1)    Other 50    NG/GT 1660    IV Piggyback 360 12.5   TPN 1240 60   Total Intake(mL/kg) 3703.5 (29.1) 72.5 (0.6)     Urine (mL/kg/hr) 2185 (0.7) 80   Emesis/NG output     Drains 200    Stool 100    Total Output 2485 80   Net +1218.5 -7.5          Intake/Output Summary (Last 24 hours) at 01/24/12 0853 Last data filed at 01/24/12 0851  Gross per 24 hour  Intake 3673.5 ml  Output   2420 ml  Net 1253.5 ml   Foley:  10/1 >>>  A:   1) Hypernatremia Improved with free water replacement P:   Free water for hypernatremia.   GASTROINTESTINAL  Lab 01/24/12 0535 01/23/12 0420 01/21/12 0520 01/18/12 2322 01/18/12  AST 47* 34 22 22 15   ALT 32 20 13 14 15   ALKPHOS 60 64 72 66 73  BILITOT 0.8 0.5 0.4 0.7 0.7  PROT 6.4 6.5 6.2 5.2* 5.5*  ALBUMIN 2.0* 2.0* 1.9* 1.9* 2.2*   A:   Status post exploratory lap, partial colectomy, colostomy. Abdominal abscess 10/7 P:   Started TF and if tolerated would d/c TPN soon per CCS Post op care per CCS.  HEMATOLOGIC  Lab 01/24/12 0535 01/23/12 0420 01/22/12 0550 01/21/12 0520 01/20/12 0437  HGB 12.5* 13.1 11.7* 12.9* 12.3*  HCT 38.9* 40.6 37.5* 40.0 38.3*  PLT 309 361 329 378 364  INR -- -- -- -- --  APTT -- -- -- -- --   A:   1) Hemoconcentration? Got diuresed negative almost 3 liters. P:  Trend CBC   INFECTIOUS  Lab 01/24/12 0535 01/23/12 0420 01/22/12 0550 01/21/12 0520 01/20/12 0437 01/19/12 0503 01/18/12 2005  WBC 11.8* 12.4* 8.3 10.5 8.3 -- --  PROCALCITON -- -- -- -- 1.54 2.37 2.43   Cultures: Blood culture x2 10/3>>>NTD  Antibiotics: Zosyn 9/30 >>> Micafungin 10/07>>  A:   Peritonitis, diverticular abscess with persistent fevers. Left lower quadrant intra-abdominal abscess 10/7 P:   Antibiotics / cultures as above percutaneous drain 10/8 by IR  ENDOCRINE  Lab 01/24/12 0736 01/23/12 2346 01/23/12 1931 01/23/12 1647 01/23/12 1117  GLUCAP 130* 154* 157* 143* 143*   A:  Hyperglycemia. Glycemic control P:   Goal CBG 120 to 180 SSI  NEUROLOGIC  A:  Acute encephalopathy secondary to sedation.  Post op pain. He woke up  in the pm hours, was more agitated. On Precedex. CT head was negative for acute changes.  P:   Supportive care Precedex   BEST PRACTICE / DISPOSITION Level of Care:  ICU Primary Service:  CCS Consultants:  PCCM Code Status:  Full Diet:  NPO DVT Px:  Protonix GI Px:  Lovenox Skin Integrity:  Intact Social / Family:  Updated family at bedside  Critical care time 35 minutes.  Coralyn Helling, MD [facility] Pulmonary/Critical Care 01/24/2012, 10:03 AM Pager:  [phone] After 3pm call: [phone]

## 2012-01-24 NOTE — H&P (Signed)
[name] is an 41 y.o. male.   Chief Complaint: pt with diverticulits; bowel obstruction Post op colectomy; colostomy 10/1 New fever and high wbc CT reveals left abd fluid collections Scheduled for abscess drain placement HPI: obese; divertic dz  Past Medical History  Diagnosis Date  . Urinary anastomotic stricture undiagnosed     hard to be cathed; urology did the last time  . Diverticulitis   . Colon obstruction 01/17/2012  . Obesity, Class III, BMI 40-49.9 (morbid obesity) 01/17/2012    Past Surgical History  Procedure Date  . No past surgeries   . Partial colectomy 01/17/2012    Procedure: PARTIAL COLECTOMY;  Surgeon: Clovis Pu. Cornett, MD;  Location: [facility];  Service: General;  Laterality: N/A;  . 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 Cornett, MD;  Location: [facility];  Service: General;  Laterality: N/A;    History reviewed. No pertinent family history. Social History:  reports that he has been smoking Cigarettes.  He has a 40 pack-year smoking history. He has never used smokeless tobacco. He reports that he does not drink alcohol or use illicit drugs.  Allergies: No Known Allergies  Medications Prior to Admission  Medication Sig Dispense Refill  . cyclobenzaprine (FLEXERIL) 10 MG tablet Take 1 tablet (10 mg total) by mouth 2 (two) times daily as needed for muscle spasms.  20 tablet  0  . HYDROcodone-acetaminophen (NORCO/VICODIN) 5-325 MG per tablet Take 1 tablet by mouth every 6 (six) hours as needed. For pain.      Marland Kitchen HYDROcodone-acetaminophen (VICODIN) 5-500 MG per tablet Take 1-2 tablets by mouth every 6 (six) hours as needed for pain.  15 tablet  0  . naproxen sodium (ANAPROX) 220 MG tablet Take 440 mg by mouth 2 (two) times daily with a meal.        Results for orders placed during the hospital encounter of 01/16/12 (from the past 48 hour(s))  GLUCOSE, CAPILLARY     Status: Abnormal   Collection Time   01/22/12 11:52 AM      Component  Value Range Comment   Glucose-Capillary 129 (*) 70 - 99 mg/dL    Comment 1 Notify RN      Comment 2 Documented in Chart     GLUCOSE, CAPILLARY     Status: Abnormal   Collection Time   01/22/12  3:18 PM      Component Value Range Comment   Glucose-Capillary 131 (*) 70 - 99 mg/dL   GLUCOSE, CAPILLARY     Status: Abnormal   Collection Time   01/22/12  7:10 PM      Component Value Range Comment   Glucose-Capillary 155 (*) 70 - 99 mg/dL   GLUCOSE, CAPILLARY     Status: Abnormal   Collection Time   01/23/12 12:07 AM      Component Value Range Comment   Glucose-Capillary 137 (*) 70 - 99 mg/dL   GLUCOSE, CAPILLARY     Status: Abnormal   Collection Time   01/23/12  3:04 AM      Component Value Range Comment   Glucose-Capillary 154 (*) 70 - 99 mg/dL   BLOOD GAS, ARTERIAL     Status: Abnormal   Collection Time   01/23/12  3:55 AM      Component Value Range Comment   FIO2 0.30      Delivery systems VENTILATOR      Mode PRESSURE REGULATED VOLUME CONTROL  VT 0.580      Rate 14      Peep/cpap 5.0      pH, Arterial 7.496 (*) 7.350 - 7.450    pCO2 arterial 41.7  35.0 - 45.0 mmHg    pO2, Arterial 79.0 (*) 80.0 - 100.0 mmHg    Bicarbonate 31.6 (*) 20.0 - 24.0 mEq/L    TCO2 27.5  0 - 100 mmol/L    Acid-Base Excess 8.1 (*) 0.0 - 2.0 mmol/L    O2 Saturation 95.1      Patient temperature 100.3      Collection site RIGHT RADIAL      Drawn by  CHELSEA HOLCOMB, RRT, RCP       Sample type ARTERIAL DRAW      Allens test (pass/fail) PASS  PASS   COMPREHENSIVE METABOLIC PANEL     Status: Abnormal   Collection Time   01/23/12  4:20 AM      Component Value Range Comment   Sodium 150 (*) 135 - 145 mEq/L    Potassium 3.3 (*) 3.5 - 5.1 mEq/L    Chloride 108  96 - 112 mEq/L    CO2 32  19 - 32 mEq/L    Glucose, Bld 154 (*) 70 - 99 mg/dL    BUN 27 (*) 6 - 23 mg/dL    Creatinine, Ser 4.09  0.50 - 1.35 mg/dL    Calcium 8.6  8.4 - 81.1 mg/dL    Total Protein 6.5  6.0 - 8.3 g/dL    Albumin 2.0 (*) 3.5  - 5.2 g/dL    AST 34  0 - 37 U/L    ALT 20  0 - 53 U/L    Alkaline Phosphatase 64  39 - 117 U/L    Total Bilirubin 0.5  0.3 - 1.2 mg/dL    GFR calc non Af Amer >90  >90 mL/min    GFR calc Af Amer >90  >90 mL/min   MAGNESIUM     Status: Abnormal   Collection Time   01/23/12  4:20 AM      Component Value Range Comment   Magnesium 2.9 (*) 1.5 - 2.5 mg/dL   PHOSPHORUS     Status: Normal   Collection Time   01/23/12  4:20 AM      Component Value Range Comment   Phosphorus 3.3  2.3 - 4.6 mg/dL   CBC     Status: Abnormal   Collection Time   01/23/12  4:20 AM      Component Value Range Comment   WBC 12.4 (*) 4.0 - 10.5 K/uL    RBC 4.26  4.22 - 5.81 MIL/uL    Hemoglobin 13.1  13.0 - 17.0 g/dL    HCT 91.4  78.2 - 95.6 %    MCV 95.3  78.0 - 100.0 fL    MCH 30.8  26.0 - 34.0 pg    MCHC 32.3  30.0 - 36.0 g/dL    RDW 21.3  08.6 - 57.8 %    Platelets 361  150 - 400 K/uL   DIFFERENTIAL     Status: Abnormal   Collection Time   01/23/12  4:20 AM      Component Value Range Comment   Neutrophils Relative 77  43 - 77 %    Lymphocytes Relative 13  12 - 46 %    Monocytes Relative 8  3 - 12 %    Eosinophils Relative 1  0 - 5 %  Basophils Relative 1  0 - 1 %    Neutro Abs 9.6 (*) 1.7 - 7.7 K/uL    Lymphs Abs 1.6  0.7 - 4.0 K/uL    Monocytes Absolute 1.0  0.1 - 1.0 K/uL    Eosinophils Absolute 0.1  0.0 - 0.7 K/uL    Basophils Absolute 0.1  0.0 - 0.1 K/uL    Smear Review MORPHOLOGY UNREMARKABLE     CHOLESTEROL, TOTAL     Status: Normal   Collection Time   01/23/12  4:20 AM      Component Value Range Comment   Cholesterol 123  0 - 200 mg/dL   TRIGLYCERIDES     Status: Abnormal   Collection Time   01/23/12  4:20 AM      Component Value Range Comment   Triglycerides 341 (*) <150 mg/dL   PREALBUMIN     Status: Abnormal   Collection Time   01/23/12  4:20 AM      Component Value Range Comment   Prealbumin 5.5 (*) 17.0 - 34.0 mg/dL   GLUCOSE, CAPILLARY     Status: Abnormal   Collection Time    01/23/12  7:28 AM      Component Value Range Comment   Glucose-Capillary 163 (*) 70 - 99 mg/dL    Comment 1 Documented in Chart      Comment 2 Notify RN     GLUCOSE, CAPILLARY     Status: Abnormal   Collection Time   01/23/12 11:17 AM      Component Value Range Comment   Glucose-Capillary 143 (*) 70 - 99 mg/dL    Comment 1 Notify RN      Comment 2 Documented in Chart     GLUCOSE, CAPILLARY     Status: Abnormal   Collection Time   01/23/12  4:47 PM      Component Value Range Comment   Glucose-Capillary 143 (*) 70 - 99 mg/dL   GLUCOSE, CAPILLARY     Status: Abnormal   Collection Time   01/23/12  7:31 PM      Component Value Range Comment   Glucose-Capillary 157 (*) 70 - 99 mg/dL   GLUCOSE, CAPILLARY     Status: Abnormal   Collection Time   01/23/12 11:46 PM      Component Value Range Comment   Glucose-Capillary 154 (*) 70 - 99 mg/dL   BLOOD GAS, ARTERIAL     Status: Abnormal   Collection Time   01/24/12  3:45 AM      Component Value Range Comment   FIO2 0.30      Delivery systems VENTILATOR      Mode PRESSURE REGULATED VOLUME CONTROL      VT 580      Rate 14      Peep/cpap 5.0      pH, Arterial 7.489 (*) 7.350 - 7.450    pCO2 arterial 33.8 (*) 35.0 - 45.0 mmHg    pO2, Arterial 83.1  80.0 - 100.0 mmHg    Bicarbonate 25.0 (*) 20.0 - 24.0 mEq/L    TCO2 21.9  0 - 100 mmol/L    Acid-Base Excess 2.9 (*) 0.0 - 2.0 mmol/L    O2 Saturation 95.1      Patient temperature 101.4      Collection site LEFT RADIAL      Drawn by [id number]      Sample type ARTERIAL      Allens test (pass/fail) PASS  PASS   CBC  Status: Abnormal   Collection Time   01/24/12  5:35 AM      Component Value Range Comment   WBC 11.8 (*) 4.0 - 10.5 K/uL    RBC 4.10 (*) 4.22 - 5.81 MIL/uL    Hemoglobin 12.5 (*) 13.0 - 17.0 g/dL    HCT 16.1 (*) 09.6 - 52.0 %    MCV 94.9  78.0 - 100.0 fL    MCH 30.5  26.0 - 34.0 pg    MCHC 32.1  30.0 - 36.0 g/dL    RDW 04.5  40.9 - 81.1 %    Platelets 309  150 - 400 K/uL     COMPREHENSIVE METABOLIC PANEL     Status: Abnormal   Collection Time   01/24/12  5:35 AM      Component Value Range Comment   Sodium 146 (*) 135 - 145 mEq/L    Potassium 3.6  3.5 - 5.1 mEq/L    Chloride 108  96 - 112 mEq/L    CO2 26  19 - 32 mEq/L    Glucose, Bld 138 (*) 70 - 99 mg/dL    BUN 28 (*) 6 - 23 mg/dL    Creatinine, Ser 9.14  0.50 - 1.35 mg/dL    Calcium 8.1 (*) 8.4 - 10.5 mg/dL    Total Protein 6.4  6.0 - 8.3 g/dL    Albumin 2.0 (*) 3.5 - 5.2 g/dL    AST 47 (*) 0 - 37 U/L    ALT 32  0 - 53 U/L    Alkaline Phosphatase 60  39 - 117 U/L    Total Bilirubin 0.8  0.3 - 1.2 mg/dL    GFR calc non Af Amer >90  >90 mL/min    GFR calc Af Amer >90  >90 mL/min   GLUCOSE, CAPILLARY     Status: Abnormal   Collection Time   01/24/12  7:36 AM      Component Value Range Comment   Glucose-Capillary 130 (*) 70 - 99 mg/dL    Comment 1 Documented in Chart      Comment 2 Notify RN      Ct Head Wo Contrast  01/23/2012  *RADIOLOGY REPORT*  Clinical Data:  Altered mental status after abdominal surgery.  CT HEAD WITHOUT CONTRAST  Technique:  Contiguous axial images were obtained from the base of the skull through the vertex without contrast  Comparison:  None.  Findings:  The brain has a normal appearance without evidence for hemorrhage, acute infarction, hydrocephalus, or mass lesion.  There is no extra axial fluid collection.  The skull and paranasal sinuses are normal.  IMPRESSION: Normal CT of the head without contrast.   Original Report Authenticated By: Elsie Stain, M.D.    Ct Abdomen Pelvis W Contrast  01/23/2012  *RADIOLOGY REPORT*  Clinical Data: Status post colon surgery for diverticulitis. Evaluate for abscess.  CT ABDOMEN AND PELVIS WITH CONTRAST  The colon has been transected.  The proximal elongated and the of the distal colon lies in the left mid abdomen correctly adjacent to this collection.  The proximal colon is diverted, along the left transverse colon, into the left mid abdomen  colostomy.  The remaining right colon is unremarkable.  The remaining left colon shows no wall thickening or inflammatory changes.  Adjacent to the collection and intermixed with small bubbles of extraluminal air is inflamatory-type stranding.   There are mildly dilated loops of proximal small bowel with air- fluid levels.  Maximal dilation  of the small bowel is 4 cm.  A discrete transition point is not seen.  The small bowel does appear thick-walled where it contacts an additional small collection that lies along the anterior left mid abdomen.  The second and separate collection measures 8.4 cm x 2.8 cm transversely and measures 9 cm in length.  It contains fluid and nondependent air.  There are small left and minimal right pleural effusions.  There is dependent atelectasis at the lung bases.  The heart is normal in size.  The liver shows mild diffuse fatty infiltration but is otherwise unremarkable.  Normal spleen, gallbladder and pancreas. No bile duct dilation.  No adrenal masses. The kidneys and ureters are unremarkable.  The bladder is decompressed by Foley catheter. No enlarged lymph nodes.  IMPRESSION: There are collections in the left abdomen, the largest extending along the left pericolic gutter into the left upper quadrant and adjacent to the greater curvature of the stomach.  A smaller collection lies in the left anterior mid to lower abdomen.  Small bowel loops adjacent to the smaller collection appear thick-walled and there is a small bowel dilation proximal to this.  This is likely a mild focal adynamic ileus due to the small bowel wall inflammation at the level of the collection.  The other collection is in close proximity to the remaining left colon proximal anastomose the staple line.  This may potentially be leaking. The collections are consistent with abscesses in the proper clinical setting.  No other acute findings.  The lung bases show small left minimal right pleural effusions and lung base  atelectasis.   Original Report Authenticated By: Domenic Moras, M.D.    Dg Chest Port 1 View  01/24/2012  *RADIOLOGY REPORT*  Clinical Data: 41 year old male with respiratory failure with hypoxia.  PORTABLE CHEST - 1 VIEW  Comparison: 01/23/2012 and earlier.  Findings: Portable semi upright AP view 0516 hours.  Stable endotracheal tube.  Stable left subclavian central line.  Stable visualized enteric tube. Continued low lung volumes, not significantly changed.  Perihilar and basilar atelectasis not significantly changed.  No pneumothorax or definite effusion.  Stable cardiac size and mediastinal contours.  IMPRESSION:  1. Stable lines and tubes. 2.  Stable low lung volumes and atelectasis.   Original Report Authenticated By: Harley Hallmark, M.D.    Dg Chest Port 1 View  01/23/2012  *RADIOLOGY REPORT*  Clinical Data: 41 year old male endotracheal tube placement. Respiratory failure.  PORTABLE CHEST - 1 VIEW  Comparison: 01/22/2012 and earlier.  Findings: Portable semi upright AP view 0458 hours.  Endotracheal tube tip is stable the level of clavicles.  Stable left subclavian central line. Lower lung volumes.  Patchy perihilar and basilar opacity most resembles atelectasis.  No pneumothorax, pulmonary edema or pleural effusion.  Stable cardiac size and mediastinal contours.  IMPRESSION: 1. Stable lines and tubes. 2.  Very low lung volumes with perihilar and basilar opacity which most resembles atelectasis.   Original Report Authenticated By: Harley Hallmark, M.D.     Review of Systems  Constitutional: Positive for fever.  Cardiovascular: Negative for chest pain.  Gastrointestinal: Positive for abdominal pain.  Neurological: Positive for weakness.    Blood pressure 97/65, pulse 97, temperature 103.2 F (39.6 C), temperature source Oral, resp. rate 30, height 5\' 10"  (1.778 m), weight 280 lb 6.8 oz (127.2 kg), SpO2 96.00%. Physical Exam  Constitutional:       On vent post op  Cardiovascular: Normal  rate and regular rhythm.  No murmur heard. Respiratory: Effort normal. He has wheezes.  GI: Soft. Bowel sounds are normal.       Post op; colectomy and colostomy 10/1  Neurological:       On vent; consented wife     Assessment/Plan Post op partial colectomy;colostomy 10/1 CT reveals left abd abscess High wbc and fever Scheduled for abd abscess drain placement On Vent pts wife aware of procedure benefits and risks and agreeable to proceed Consent signed and in chart   Sanjna Haskew A 01/24/2012, 8:53 AM

## 2012-01-25 ENCOUNTER — Inpatient Hospital Stay (HOSPITAL_COMMUNITY): Payer: Medicaid Other

## 2012-01-25 LAB — GLUCOSE, CAPILLARY
Glucose-Capillary: 135 mg/dL — ABNORMAL HIGH (ref 70–99)
Glucose-Capillary: 146 mg/dL — ABNORMAL HIGH (ref 70–99)

## 2012-01-25 LAB — CULTURE, BLOOD (ROUTINE X 2)

## 2012-01-25 MED ORDER — VANCOMYCIN HCL 1000 MG IV SOLR
1250.0000 mg | Freq: Two times a day (BID) | INTRAVENOUS | Status: DC
  Administered 2012-01-25 – 2012-01-27 (×5): 1250 mg via INTRAVENOUS
  Filled 2012-01-25 (×7): qty 1250

## 2012-01-25 MED ORDER — POTASSIUM CHLORIDE 20 MEQ/15ML (10%) PO LIQD
40.0000 meq | Freq: Every day | ORAL | Status: DC
  Administered 2012-01-25 – 2012-01-26 (×2): 40 meq
  Filled 2012-01-25 (×4): qty 30

## 2012-01-25 MED ORDER — FUROSEMIDE 10 MG/ML IJ SOLN
40.0000 mg | Freq: Every day | INTRAMUSCULAR | Status: DC
  Administered 2012-01-25 – 2012-01-27 (×3): 40 mg via INTRAVENOUS
  Filled 2012-01-25 (×3): qty 4

## 2012-01-25 MED ORDER — ENOXAPARIN SODIUM 60 MG/0.6ML ~~LOC~~ SOLN
60.0000 mg | SUBCUTANEOUS | Status: DC
  Administered 2012-01-25 – 2012-02-02 (×9): 60 mg via SUBCUTANEOUS
  Filled 2012-01-25 (×10): qty 0.6

## 2012-01-25 MED ORDER — VITAL AF 1.2 CAL PO LIQD
1000.0000 mL | ORAL | Status: DC
  Filled 2012-01-25 (×3): qty 1000

## 2012-01-25 MED ORDER — PRO-STAT SUGAR FREE PO LIQD
30.0000 mL | Freq: Every day | ORAL | Status: DC
  Administered 2012-01-26: 30 mL
  Filled 2012-01-25: qty 30

## 2012-01-25 NOTE — Progress Notes (Signed)
PARENTERAL NUTRITION CONSULT NOTE - FOLLOW UP  Pharmacy Consult for TNA  Indication: s/p massive bowel resection  No Known Allergies  Patient Measurements: Height: 5\' 10"  (177.8 cm) Weight: 274 lb 7.6 oz (124.5 kg) IBW/kg (Calculated) : 73  Adjusted Body Weight: 99 kg  Noted 40lb wt loss pre-op (remains obese)  Vital Signs: Temp: 101.5 F (38.6 C) (10/09 0400) Temp src: Oral (10/09 0400) BP: 94/60 mmHg (10/09 0700) Pulse Rate: 74  (10/09 0700) Intake/Output from previous day: 10/08 0701 - 10/09 0700 In: 2592.9 [I.V.:322.9; NG/GT:1140; IV Piggyback:225; TPN:900] Out: 5635 [Urine:1810; Drains:1675; Stool:2150]  Labs:  [facility] 01/24/12 0900 01/24/12 0535 01/23/12 0420  WBC -- 11.8* 12.4*  HGB -- 12.5* 13.1  HCT -- 38.9* 40.6  PLT -- 309 361  APTT 32 -- --  INR 1.19 -- --    Basename 01/24/12 0535 01/23/12 0420  NA 146* 150*  K 3.6 3.3*  CL 108 108  CO2 26 32  GLUCOSE 138* 154*  BUN 28* 27*  CREATININE 0.87 0.95  LABCREA -- --  CREAT24HRUR -- --  CALCIUM 8.1* 8.6  MG -- 2.9*  PHOS -- 3.3  PROT 6.4 6.5  ALBUMIN 2.0* 2.0*  AST 47* 34  ALT 32 20  ALKPHOS 60 64  BILITOT 0.8 0.5  BILIDIR -- --  IBILI -- --  PREALBUMIN -- 5.5*  TRIG -- 341*  CHOLHDL -- --  CHOL -- 123   Estimated Creatinine Clearance: 149.4 ml/min (by C-G formula based on Cr of 0.87).    Basename 01/25/12 0345 01/24/12 2355 01/24/12 1916  GLUCAP 138* 135* 124*   CBGs & Insulin requirements past 24 hours:  CBGs 130-146, required 17 units SSI mod and 10 units Lantus  Nutritional Goals:  RD recs 10/9: 2428 kcal, 137-161 g protein per day Clinimix E 5/20 @ 174ml/hr and IVFE 20% @ 10 ml/hr on MWF will provide 120 g protein/day and an average of 2318 kcal/day (2592 kcal on lipid days, 2112 kcal on nonlipid days). Meeting 93% Kcal needs and >100% protein needs.  Current nutrition:  Vital AF 1.2 cal @ 20 ml/hr (starting 10/7 AM)  10/9 Current rate : 20 ml/hr  Goal rate 60 ml/hr + one  packet prostat daily to provide 1828 kcal and 123 grams protein (Meets 103% of estimated energy needs per ASPEN guidelines and 90% of estimated protein needs).   TNA: Clinimix 5/15 @ 70 ml/hr MIVF: D5 @ KVO, Free water 200 ml PT q4h  Assessment:   41 yom presented 9/30 with abd pain x 1 month, N/V. CT w/ stricture of descending colon with colonic obstruction. 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 Complicated by hypoxemic respiratory failure requiring intubation 10/2. TNA started 10/4. Per RD note, pt reports poor PO intake prior to admission, nausea, dry heaves, and lack of appetite. Pt reports 29 lb unintentional weight loss in the past month.  Due to increased Na, Mag - electrolytes were removed from TNA on  (1L of Clinimix E will provide 35 mEq/L Na).  Na improving.    TF started 10/7, advance as tolerated.  TF held yesterday for perc drain placement and resumed 10/8.  Planning to increase TF to goal rate today.  Labs Renal function: Scr wnl/stable Hepatic function: Slight elevation of AST, ALT/Alk phos wnl Electrolytes:  Na improving, 146 Pre-Albumin:  5.5 (10/7) TG/Cholesterol: TG elevated 10/5 (422), 10/7 down to 341  Plan:    Decrease TPN to 1/2 rate of 35 ml/hr  in anticipation of TF rate increase  When current bag expires at 1800 tonight, d/c TPN orders  Current insulin orders are Lantus and resistant SSI.  Continue per MD.   Lynann Beaver PharmD, BCPS Pager [phone] 01/25/2012 2:40 PM

## 2012-01-25 NOTE — Progress Notes (Signed)
Name: [name] MRN: [id number] DOB: March 19, 1971    LOS: 9  Referring Provider:  CCS Reason for Referral:  Acute respiratory failure  PULMONARY / CRITICAL CARE MEDICINE  Brief patient description:  41 yo morbidly obese male with suspected OSA/OHS admitted 9/30 with perf bowel due to stricture, + peritonitis, s/p exploratory laparotomy with partial colectomy and colostomy (10/1).  The post op course has been complicated by hypoxemic respiratory failure requiring intubation.  Events Since Admission: 9/30  Admitted with abdominal pain, nausea and vomiting 10/1  Exploratory laparotomy, partial colectomy, colostomy; peritoneal abscess noted 10/2  Intubated for hypoxemic respiratory failure 10/7: persistent fevers, CT of abdomen and pelvis demonstrated large left-sided fluid collection, and the left abdomen. 10/8: Percutaneous drain placed in left lower quadrant fluid collection by interventional radiology Current Status: Now on Precedex, we have re initiated weaning trial Vital Signs: Temp:  [99.3 F (37.4 C)-102.9 F (39.4 C)] 99.3 F (37.4 C) (10/09 0800) Pulse Rate:  [74-93] 74  (10/09 0700) Resp:  [24-31] 24  (10/09 0700) BP: (86-114)/(55-74) 94/60 mmHg (10/09 0700) SpO2:  [95 %-100 %] 98 % (10/09 0700) FiO2 (%):  [30 %] 30 % (10/09 0700) Weight:  [124.5 kg (274 lb 7.6 oz)] 124.5 kg (274 lb 7.6 oz) (10/09 0500)  Physical Examination:  Gen: obese, sedated on vent HEENT: NCAT, PERRL, ETT in place PULM: scattered rhonchi CV: RRR, no mgr AB: BS+ (infrequent), wound vac and colostomy in place, left lower quadrant percutaneous drain in place appears to be draining feculent appearing matter Ext: warm, scds, trace edema Neuro: Sedated on vent, localizing vs posturing   Principal Problem:  *Colon obstruction Active Problems:  UTI (lower urinary tract infection)  Diverticulitis of colon without hemorrhage  Obesity, Class III, BMI 40-49.9 (morbid obesity)  Acute respiratory  failure with hypoxia  Acute pulmonary edema  Encephalopathy acute  Tobacco abuse  Intra-abdominal abscess, diverticular s/p surgical drainage  Hypertriglyceridemia  ASSESSMENT AND PLAN  PULMONARY  Lab 01/24/12 0345 01/23/12 0355 01/22/12 0408 01/21/12 0421 01/20/12 0422  PHART 7.489* 7.496* 7.442 7.433 7.407  PCO2ART 33.8* 41.7 50.6* 50.0* 52.3*  PO2ART 83.1 79.0* 94.4 82.1 105.0*  HCO3 25.0* 31.6* 33.4* 32.4* 31.9*  O2SAT 95.1 95.1 96.4 95.3 97.6   Ventilator Settings: Vent Mode:  [-] PRVC FiO2 (%):  [30 %] 30 % Set Rate:  [14 bmp] 14 bmp Vt Set:  [580 mL] 580 mL PEEP:  [5 cmH20] 5 cmH20 Plateau Pressure:  [21 cmH20-23 cmH20] 21 cmH20  CXR:  10/9>>> Hypoinflation, bilateral airspace disease, bibasilar atelectasis no significant change ETT:  10/2 >>>  A:   1) Acute hypoxemic / hypercarbic respiratory failure secondary to acute pulmonary edema , ALI and  Atelectasis. 2) Suspected OSA/OHS. CXR w/ bilateral atelectasis, still w/ bilateral airspace disease. Think ongoing infection his likely his major barrier to weaning.  P:   Continue Precedex Daily assessment for weaning Keep fluid balance even or negative  CARDIOVASCULAR  Lab 01/21/12 0520 01/19/12 1850 01/19/12 1035 01/19/12 0503 01/19/12 0441 01/18/12 2322 01/18/12 2317 01/18/12 2006 01/18/12 2005  TROPONINI -- <0.30 <0.30 <0.30 -- <0.30 -- -- --  LATICACIDVEN -- -- 1.8 -- 2.1 -- 2.2 2.3* --  PROBNP 82.8 -- -- -- -- -- -- -- 364.1*   Lines: R [place] TLC 10/2 >>>  A: Hemodynamically stable.   P:  Goal MAP > 60  RENAL  Lab 01/24/12 0535 01/23/12 0420 01/22/12 0550 01/21/12 0520 01/20/12 0437  NA 146* 150* 149* 146*  145  K 3.6 3.3* -- -- --  CL 108 108 107 105 106  CO2 26 32 34* 33* 32  BUN 28* 27* 29* 25* 24*  CREATININE 0.87 0.95 1.01 1.01 1.11  CALCIUM 8.1* 8.6 8.2* 8.5 8.4  MG -- 2.9* 2.8* 2.5 --  PHOS -- 3.3 2.9 3.1 --   Intake/Output      10/08 0701 - 10/09 0700 10/09 0701 - 10/10 0700   I.V. (mL/kg)  322.9 (2.6)    Other 5    NG/GT 1140    IV Piggyback 225    TPN 900    Total Intake(mL/kg) 2592.9 (20.8)    Urine (mL/kg/hr) 1810 (0.6)    Drains 1675    Stool 2150    Total Output 5635    Net -3042.1           Intake/Output Summary (Last 24 hours) at 01/25/12 0908 Last data filed at 01/25/12 0600  Gross per 24 hour  Intake   2431 ml  Output   5205 ml  Net  -2774 ml   Foley:  10/1 >>>  A:   1) Hypernatremia Improved with free water replacement P:   Free water for hypernatremia.   GASTROINTESTINAL  Lab 01/24/12 0535 01/23/12 0420 01/21/12 0520 01/18/12 2322  AST 47* 34 22 22  ALT 32 20 13 14   ALKPHOS 60 64 72 66  BILITOT 0.8 0.5 0.4 0.7  PROT 6.4 6.5 6.2 5.2*  ALBUMIN 2.0* 2.0* 1.9* 1.9*   A:   Status post exploratory lap, partial colectomy, colostomy. Abdominal abscess 10/7 P:   Started TF and if tolerated would d/c TPN soon per CCS Post op care per CCS.  HEMATOLOGIC  Lab 01/24/12 0900 01/24/12 0535 01/23/12 0420 01/22/12 0550 01/21/12 0520 01/20/12 0437  HGB -- 12.5* 13.1 11.7* 12.9* 12.3*  HCT -- 38.9* 40.6 37.5* 40.0 38.3*  PLT -- 309 361 329 378 364  INR 1.19 -- -- -- -- --  APTT 32 -- -- -- -- --   A:   1) Hemoconcentration? Got diuresed negative almost 3 liters. P:  Trend CBC   INFECTIOUS  Lab 01/24/12 0535 01/23/12 0420 01/22/12 0550 01/21/12 0520 01/20/12 0437 01/19/12 0503 01/18/12 2005  WBC 11.8* 12.4* 8.3 10.5 8.3 -- --  PROCALCITON -- -- -- -- 1.54 2.37 2.43   Cultures: Blood culture x2 10/3: negative Peritoneal fluid 10/8:abundant CPR, moderate GNR, moderate gram variable rod>>> Antibiotics: Zosyn 9/30 >>> Micafungin 10/07>> Vancomycin 10/9>>> A:   Peritonitis, diverticular abscess with persistent fevers. Left lower quadrant intra-abdominal abscess 10/7 status post cutaneous drain on 10/8 Maximum temp was greater than 102 the last 24 hours, it appears that this could be trending down however think there is little downside  broadening coverage at this point. P:   Antibiotics / cultures as above, Adding Vanc, will followup on peritoneal culture, narrow antibiotics as sensitivities dictate percutaneous drain management per surgery Will need followup CT scan 5-7 days  ENDOCRINE  Lab 01/25/12 0738 01/25/12 0345 01/24/12 2355 01/24/12 1916 01/24/12 1539  GLUCAP 146* 138* 135* 124* 146*   A:  Hyperglycemia. Glycemic control P:   Goal CBG 120 to 180 SSI  NEUROLOGIC  A:  Acute encephalopathy secondary to sedation.  Post op pain. He woke up in the pm hours, was more agitated. On Precedex. CT head was negative for acute changes.  P:   Supportive care Precedex   BEST PRACTICE / DISPOSITION Level of Care:  ICU Primary Service:  CCS Consultants:  PCCM Code Status:  Full Diet:  NPO DVT Px:  Protonix GI Px:  Lovenox Skin Integrity:  Intact Social / Family:  Updated family at bedside  Reviewed above, examined pt, and agree with assessment/plan.  Fever curve trending down after perc drain.  Will adjust Abx pending final cx results.  Continue full vent support for now.    Critical care time 35 minutes.  Coralyn Helling, MD [facility] Pulmonary/Critical Care 01/25/2012, 10:29 AM Pager:  [phone] After 3pm call: [phone]

## 2012-01-25 NOTE — Progress Notes (Signed)
Nutrition Follow-up  Intervention:  1. TNA per pharmacy. Recommend wean TNA if patient is with positive tolerance of TF.  2. Will increase TF of Vital 1.2 at 20 ml/hr by 10 ml every 4 hours to a goal rate of 60 ml/hr. Plus one packet prostat daily. Total enteral nutrition to provide 1828 kcal and 123 grams protein (Meets 103% of estimated energy needs per ASPEN guidelines and 90% of estimated protein needs).  3. RD to follow for nutrition plan of care.   Assessment:   Patient remains intubated on mechanical ventilation. Received consult for TF initiation and management. Per RN patient tolerating TF of Vital 1.2 @ 20 ml/hr, providing 576 kcal and 36 grams protein. She reported MD would like to advance the TF and wean the TNA. RN reported patient with good colostomy output, about 50 cc this morning. Noted 10/8 percutaneous drain placed in lower left quadrant for large left sided fluid collection. Noted patient with trace edema. Current TNA of Clinimix E 5/15 at 70 ml/hr provides 1399 kcal and 84 grams protein.   MVe: 17.0 Tmax: 37.4  Diet Order:  NPO. Clinimix E 5/15 @ 70 ml/hr plus lipids 20% at 10 ml/hr MWF. TF of Vital 1.2 @ 20 ml/hr.   Meds: Scheduled Meds:   . antiseptic oral rinse  15 mL Mouth Rinse QID  . chlorhexidine  15 mL Mouth Rinse BID  . enoxaparin  60 mg Subcutaneous Q24H  . free water  200 mL Per Tube Q4H  . furosemide  40 mg Intravenous Daily  . insulin aspart  0-20 Units Subcutaneous Q4H  . insulin glargine  10 Units Subcutaneous QHS  . metoprolol  5 mg Intravenous Q6H  . micafungin (MYCAMINE) IV  100 mg Intravenous Daily  . pantoprazole (PROTONIX) IV  40 mg Intravenous QHS  . piperacillin-tazobactam (ZOSYN)  IV  3.375 g Intravenous Q8H  . potassium chloride  30 mEq Per Tube Q8H  . potassium chloride  40 mEq Per Tube Daily  . vancomycin  1,250 mg Intravenous BID  . DISCONTD: enoxaparin  40 mg Subcutaneous Q24H  . DISCONTD: potassium chloride  40 mEq Per Tube Once    Continuous Infusions:   . dexmedetomidine 0.6 mcg/kg/hr (01/25/12 1200)  . dextrose 20 mL (01/23/12 1137)  . feeding supplement (VITAL AF 1.2 CAL) 1,000 mL (01/24/12 1539)  . TPN (CLINIMIX) +/- additives 60 mL/hr at 01/23/12 1732  . TPN (CLINIMIX) +/- additives 70 mL/hr at 01/24/12 1740  . DISCONTD: feeding supplement (VITAL AF 1.2 CAL) 1,000 mL (01/24/12 1359)   PRN Meds:.acetaminophen (TYLENOL) oral liquid 160 mg/5 mL, albuterol-ipratropium, alum & mag hydroxide-simeth, fentaNYL, hydrALAZINE, magic mouthwash, midazolam, ondansetron  Labs:  CMP     Component Value Date/Time   NA 146* 01/24/2012 0535   K 3.6 01/24/2012 0535   CL 108 01/24/2012 0535   CO2 26 01/24/2012 0535   GLUCOSE 138* 01/24/2012 0535   BUN 28* 01/24/2012 0535   CREATININE 0.87 01/24/2012 0535   CALCIUM 8.1* 01/24/2012 0535   PROT 6.4 01/24/2012 0535   ALBUMIN 2.0* 01/24/2012 0535   AST 47* 01/24/2012 0535   ALT 32 01/24/2012 0535   ALKPHOS 60 01/24/2012 0535   BILITOT 0.8 01/24/2012 0535   GFRNONAA >90 01/24/2012 0535   GFRAA >90 01/24/2012 0535     Intake/Output Summary (Last 24 hours) at 01/25/12 1323 Last data filed at 01/25/12 1219  Gross per 24 hour  Intake 3602.5 ml  Output   4860 ml  Net -  1257.5 ml    Weight Status:  124.5 kg  Re-estimated needs:  2428 kcal, 137-161 grams protein              1456-1700 kcal (60-70% of estimated energy needs) Nutrition Dx:  Inadequate Oral Intake, Ongoing.   Goal:  Meet > 90% of estimated energy needs with nutrition support per ASPEN guidelines.  Enteral nutrition to provide 60-70% of estimated calorie needs (22-25 kcals/kg ideal body weight) and >/= 90% of estimated protein needs, based on ASPEN guidelines for permissive underfeeding in critically ill obese individuals.   Monitor:  TNA per pharmacy, TF tolerance, weights, labs   Hoang, [place] [phone]

## 2012-01-25 NOTE — Progress Notes (Signed)
ANTIBIOTIC CONSULT NOTE - INITIAL  Pharmacy Consult for Vancomycin Indication: sepsis  No Known Allergies  Patient Measurements: Height: 5\' 10"  (177.8 cm) Weight: 274 lb 7.6 oz (124.5 kg) IBW/kg (Calculated) : 73   Vital Signs: Temp: 99.3 F (37.4 C) (10/09 0800) Temp src: Axillary (10/09 0800) BP: 104/64 mmHg (10/09 0900) Pulse Rate: 82  (10/09 0900) Intake/Output from previous day: 10/08 0701 - 10/09 0700 In: 2705.4 [I.V.:332.9; NG/GT:1160; IV Piggyback:237.5; TPN:970] Out: 5635 [Urine:1810; Drains:1675; Stool:2150]  Labs:  [facility] 01/24/12 0535 01/23/12 0420  WBC 11.8* 12.4*  HGB 12.5* 13.1  PLT 309 361  LABCREA -- --  CREATININE 0.87 0.95   Estimated Creatinine Clearance: 149.4 ml/min (by C-G formula based on Cr of 0.87). No results found for this basename: VANCOTROUGH:2,VANCOPEAK:2,VANCORANDOM:2,GENTTROUGH:2,GENTPEAK:2,GENTRANDOM:2,TOBRATROUGH:2,TOBRAPEAK:2,TOBRARND:2,AMIKACINPEAK:2,AMIKACINTROU:2,AMIKACIN:2, in the last 72 hours   Microbiology: Recent Results (from the past 720 hour(s))  URINE CULTURE     Status: Normal   Collection Time   01/06/12  8:42 PM      Component Value Range Status Comment   Specimen Description URINE, CLEAN CATCH   Final    Special Requests NONE   Final    Culture  Setup Time 01/07/2012 03:15   Final    Colony Count 30,000 COLONIES/ML   Final    Culture     Final    Value: Multiple bacterial morphotypes present, none predominant. Suggest appropriate recollection if clinically indicated.   Report Status 01/08/2012 FINAL   Final   SURGICAL PCR SCREEN     Status: Normal   Collection Time   01/17/12  8:28 AM      Component Value Range Status Comment   MRSA, PCR NEGATIVE  NEGATIVE Final    Staphylococcus aureus NEGATIVE  NEGATIVE Final   CULTURE, BLOOD (ROUTINE X 2)     Status: Normal   Collection Time   01/19/12 10:46 AM      Component Value Range Status Comment   Specimen Description BLOOD LEFT ARM   Final    Special Requests  BOTTLES DRAWN AEROBIC AND ANAEROBIC 10CC   Final    Culture  Setup Time 01/19/2012 16:55   Final    Culture NO GROWTH 5 DAYS   Final    Report Status 01/25/2012 FINAL   Final   CULTURE, BLOOD (ROUTINE X 2)     Status: Normal   Collection Time   01/19/12 10:48 AM      Component Value Range Status Comment   Specimen Description BLOOD LEFT ARM   Final    Special Requests BOTTLES DRAWN AEROBIC AND ANAEROBIC 10CC   Final    Culture  Setup Time 01/19/2012 16:55   Final    Culture NO GROWTH 5 DAYS   Final    Report Status 01/25/2012 FINAL   Final   CULTURE, ROUTINE-ABSCESS     Status: Normal (Preliminary result)   Collection Time   01/24/12  1:10 PM      Component Value Range Status Comment   Specimen Description PERITONEAL CAVITY   Final    Special Requests NONE   Final    Gram Stain     Final    Value: FEW WBC PRESENT, PREDOMINANTLY PMN     NO SQUAMOUS EPITHELIAL CELLS SEEN     ABUNDANT GRAM NEGATIVE RODS     ABUNDANT GRAM POSITIVE RODS     FEW GRAM VARIABLE ROD   Culture NO GROWTH 1 DAY   Final    Report Status PENDING   Incomplete  CULTURE, ROUTINE-ABSCESS     Status: Normal (Preliminary result)   Collection Time   01/24/12  2:07 PM      Component Value Range Status Comment   Specimen Description PERITONEAL CAVITY   Final    Special Requests NONE   Final    Gram Stain     Final    Value: RARE WBC PRESENT, PREDOMINANTLY PMN     NO SQUAMOUS EPITHELIAL CELLS SEEN     ABUNDANT GRAM POSITIVE RODS     MODERATE GRAM NEGATIVE RODS     MODERATE GRAM VARIABLE ROD   Culture NO GROWTH   Final    Report Status PENDING   Incomplete     Medical History: Past Medical History  Diagnosis Date  . Urinary anastomotic stricture undiagnosed     hard to be cathed; urology did the last time  . Diverticulitis   . Colon obstruction 01/17/2012  . Obesity, Class III, BMI 40-49.9 (morbid obesity) 01/17/2012    Medications:  Anti-infectives     Start     Dose/Rate Route Frequency Ordered Stop    01/23/12 1300   micafungin (MYCAMINE) 100 mg in sodium chloride 0.9 % 100 mL IVPB        100 mg 100 mL/hr over 1 Hours Intravenous Daily 01/23/12 1158     01/16/12 1800   piperacillin-tazobactam (ZOSYN) IVPB 3.375 g        3.375 g 12.5 mL/hr over 240 Minutes Intravenous 3 times per day 01/16/12 1721 01/27/12 0930         Assessment:  41 yom presented 9/30 with perforated bowel and peritonitis d/t colonic stricture and obstruction, s/p partial colectomy and colostomy on 10/1, required intubation on 10/2.  CT on 10/7 with large fluid collection, percutaneous drain placed 10/8.  Day #10 Zosyn and Day #3 Micafungin per MD, Adding vancomycin today per pharmacy dosing for continued fevers and pending peritoneal culture.  Renal function wnl, CrCl (n) > 100 ml/min  Goal of Therapy:  Vancomycin trough level 15-20 mcg/ml  Plan:   Vancomycin 1250mg  IV q12h.  Measure Vanc trough at steady state.  Follow up renal fxn and culture results.   Lynann Beaver PharmD, BCPS Pager [phone] 01/25/2012 10:02 AM

## 2012-01-25 NOTE — Progress Notes (Signed)
General Surgery Note  LOS: 9 days  POD# : 8 Days Post-Op Room - 1233  Assessment/Plan: 1.  PARTIAL COLECTOMY, COLOSTOMY - T. Cornett - 01/17/2012  Diverticulitis  On Zosyn.  Because of continued fever, discussed with Cindee Lame and will broaden antibiotic coverage with Vanc.  WBC - 11,800 - 01/24/2012.  No labs ordered for today.   VAC wound dressing - changed MWF  On tube feedings, to increase TF to goal rate today.   1b.  Left abdominal abscess - perc drain - 10/8.  1,400 cc of purulent material drained so far.  2.  UTI (lower urinary tract infection)  3.  Obesity, Class III, BMI 40-49.9 (morbid obesity)  4.  Acute respiratory failure  Remains on vent. 5.  Tobacco abuse  6.  Hypertriglyceridemia  7.  Nutrition  On TPN   Plan to advance TF to goal rate and stop TPN today. 8.  DVT proph  On Lovenox 9.  Hypokalemia  K+ - 3.6 - 01/24/2012.  Subjective:  Intubated and not responding.  Father is at the bedside.   Objective:   Filed Vitals:   01/25/12 0700  BP: 94/60  Pulse: 74  Temp:   Resp: 24    Intake/Output from previous day:  10/08 0701 - 10/09 0700 In: 2592.9 [I.V.:322.9; NG/GT:1140; IV Piggyback:225; TPN:900] Out: 5635 [Urine:1810; Drains:1675; Stool:2150]  Intake/Output this shift:      Physical Exam:   General: Obese, bearded WM. Intubated.     HEENT: Normal. Pupils equal. .   Lungs: Some mild rhonchi.   Abdomen: Obese.  Soft.  Rare BS.  Ostomy LUQ with stool.   Wound: VAC on midline wound.  Will look at wound later today.     Lab Results:     Basename 01/24/12 0535 01/23/12 0420  WBC 11.8* 12.4*  HGB 12.5* 13.1  HCT 38.9* 40.6  PLT 309 361    BMET    Basename 01/24/12 0535 01/23/12 0420  NA 146* 150*  K 3.6 3.3*  CL 108 108  CO2 26 32  GLUCOSE 138* 154*  BUN 28* 27*  CREATININE 0.87 0.95  CALCIUM 8.1* 8.6    PT/INR    Basename 01/24/12 0900  LABPROT 14.9  INR 1.19    ABG    Basename 01/24/12 0345 01/23/12 0355  PHART 7.489* 7.496*    HCO3 25.0* 31.6*     Studies/Results:  Ct Head Wo Contrast  01/23/2012  *RADIOLOGY REPORT*  Clinical Data:  Altered mental status after abdominal surgery.  CT HEAD WITHOUT CONTRAST  Technique:  Contiguous axial images were obtained from the base of the skull through the vertex without contrast  Comparison:  None.  Findings:  The brain has a normal appearance without evidence for hemorrhage, acute infarction, hydrocephalus, or mass lesion.  There is no extra axial fluid collection.  The skull and paranasal sinuses are normal.  IMPRESSION: Normal CT of the head without contrast.   Original Report Authenticated By: Elsie Stain, M.D.    Ct Guided Abscess Drain  01/24/2012  *RADIOLOGY REPORT*  Clinical Data/Indication: LEFT ABDOMINAL ABSCESS  CT GUIDED ABCESS DRAINAGE WITH CATHETER  Sedation: Versed one mg, Fentanyl 50 mg.  Total Moderate Sedation Time: 15 minutes.  Procedure: The procedure, risks, benefits, and alternatives were explained to the patient. Questions regarding the procedure were encouraged and answered. The patient understands and consents to the procedure.  The left flank was prepped with betadine in a sterile fashion, and a sterile drape was  applied covering the operative field. A sterile gown and sterile gloves were used for the procedure.  Under CT guidance, an 18 gauge needle was inserted into the left abdominal abscess.  It was removed over an Amplatz.  A 12-French dilator followed by a 12-French drain were inserted.  It was looped and string fixed in the abscess cavity and sewn to the skin. Dark cloudy pus was aspirated.  Findings: Imaging demonstrates a 12-French abscess drain placement in the a left abdominal fluid collection.  Complications: None.  IMPRESSION: Successful left sided abdominal abscess drainage.   Original Report Authenticated By: Donavan Burnet, M.D.    Ct Abdomen Pelvis W Contrast  01/23/2012  *RADIOLOGY REPORT*  Clinical Data: Status post colon surgery for  diverticulitis. Evaluate for abscess.  CT ABDOMEN AND PELVIS WITH CONTRAST  The colon has been transected.  The proximal elongated and the of the distal colon lies in the left mid abdomen correctly adjacent to this collection.  The proximal colon is diverted, along the left transverse colon, into the left mid abdomen colostomy.  The remaining right colon is unremarkable.  The remaining left colon shows no wall thickening or inflammatory changes.  Adjacent to the collection and intermixed with small bubbles of extraluminal air is inflamatory-type stranding.   There are mildly dilated loops of proximal small bowel with air- fluid levels.  Maximal dilation of the small bowel is 4 cm.  A discrete transition point is not seen.  The small bowel does appear thick-walled where it contacts an additional small collection that lies along the anterior left mid abdomen.  The second and separate collection measures 8.4 cm x 2.8 cm transversely and measures 9 cm in length.  It contains fluid and nondependent air.  There are small left and minimal right pleural effusions.  There is dependent atelectasis at the lung bases.  The heart is normal in size.  The liver shows mild diffuse fatty infiltration but is otherwise unremarkable.  Normal spleen, gallbladder and pancreas. No bile duct dilation.  No adrenal masses. The kidneys and ureters are unremarkable.  The bladder is decompressed by Foley catheter. No enlarged lymph nodes.  IMPRESSION: There are collections in the left abdomen, the largest extending along the left pericolic gutter into the left upper quadrant and adjacent to the greater curvature of the stomach.  A smaller collection lies in the left anterior mid to lower abdomen.  Small bowel loops adjacent to the smaller collection appear thick-walled and there is a small bowel dilation proximal to this.  This is likely a mild focal adynamic ileus due to the small bowel wall inflammation at the level of the collection.  The  other collection is in close proximity to the remaining left colon proximal anastomose the staple line.  This may potentially be leaking. The collections are consistent with abscesses in the proper clinical setting.  No other acute findings.  The lung bases show small left minimal right pleural effusions and lung base atelectasis.   Original Report Authenticated By: Domenic Moras, M.D.    Dg Chest Port 1 View  01/25/2012  *RADIOLOGY REPORT*  Clinical Data: 41 year old male with respiratory failure.  PORTABLE CHEST - 1 VIEW  Comparison: 01/24/2012 and earlier.  Findings: Portable semi upright AP view at 0509 hours.  Stable endotracheal tube tip at the level of clavicles.  Enteric tube courses to the abdomen.  Stable left subclavian central line. Continued very low lung volumes.  Perihilar and left basilar opacity is  stable and most resembles atelectasis.  No pneumothorax or large effusion.  IMPRESSION:  1. Stable lines and tubes. 2.  Stable low lung volumes with atelectasis.   Original Report Authenticated By: Harley Hallmark, M.D.    Dg Chest Port 1 View  01/24/2012  *RADIOLOGY REPORT*  Clinical Data: 41 year old male with respiratory failure with hypoxia.  PORTABLE CHEST - 1 VIEW  Comparison: 01/23/2012 and earlier.  Findings: Portable semi upright AP view 0516 hours.  Stable endotracheal tube.  Stable left subclavian central line.  Stable visualized enteric tube. Continued low lung volumes, not significantly changed.  Perihilar and basilar atelectasis not significantly changed.  No pneumothorax or definite effusion.  Stable cardiac size and mediastinal contours.  IMPRESSION:  1. Stable lines and tubes. 2.  Stable low lung volumes and atelectasis.   Original Report Authenticated By: Harley Hallmark, M.D.      Anti-infectives:   Anti-infectives     Start     Dose/Rate Route Frequency Ordered Stop   01/23/12 1300   micafungin (MYCAMINE) 100 mg in sodium chloride 0.9 % 100 mL IVPB        100 mg 100  mL/hr over 1 Hours Intravenous Daily 01/23/12 1158     01/16/12 1800   piperacillin-tazobactam (ZOSYN) IVPB 3.375 g        3.375 g 12.5 mL/hr over 240 Minutes Intravenous 3 times per day 01/16/12 1721 01/27/12 0930          Ovidio Kin, MD, FACS Pager: [phone],   [facility] Office: [phone] 01/25/2012

## 2012-01-25 NOTE — Progress Notes (Signed)
8 Days Post-Op  Subjective: Pt intubated/sedated  Objective: Vital signs in last 24 hours: Temp:  [99.3 F (37.4 C)-102.9 F (39.4 C)] 99.6 F (37.6 C) (10/09 1200) Pulse Rate:  [74-89] 82  (10/09 0900) Resp:  [24-30] 27  (10/09 0900) BP: (86-114)/(55-70) 104/64 mmHg (10/09 0900) SpO2:  [96 %-98 %] 97 % (10/09 0900) FiO2 (%):  [30 %] 30 % (10/09 1230) Weight:  [274 lb 7.6 oz (124.5 kg)] 274 lb 7.6 oz (124.5 kg) (10/09 0500) Last BM Date: 01/25/12  Intake/Output from previous day: 10/08 0701 - 10/09 0700 In: 2714.8 [I.V.:342.3; NG/GT:1160; IV Piggyback:237.5; TPN:970] Out: 5635 [Urine:1810; Drains:1675; Stool:2150] Intake/Output this shift: Total I/O In: 1454.5 [I.V.:168; Other:5; NG/GT:440; IV Piggyback:491.5; TPN:350] Out: 1175 [Urine:1000; Drains:115; Stool:60]  LLQ drain intact, output 40 cc's today light brown fluid; 1.6 liters 10/8; cx's pend  Lab Results:   Basename 01/24/12 0535 01/23/12 0420  WBC 11.8* 12.4*  HGB 12.5* 13.1  HCT 38.9* 40.6  PLT 309 361   BMET  Basename 01/24/12 0535 01/23/12 0420  NA 146* 150*  K 3.6 3.3*  CL 108 108  CO2 26 32  GLUCOSE 138* 154*  BUN 28* 27*  CREATININE 0.87 0.95  CALCIUM 8.1* 8.6   PT/INR  Basename 01/24/12 0900  LABPROT 14.9  INR 1.19   ABG  Basename 01/24/12 0345 01/23/12 0355  PHART 7.489* 7.496*  HCO3 25.0* 31.6*   Results for orders placed during the hospital encounter of 01/16/12  SURGICAL PCR SCREEN     Status: Normal   Collection Time   01/17/12  8:28 AM      Component Value Range Status Comment   MRSA, PCR NEGATIVE  NEGATIVE Final    Staphylococcus aureus NEGATIVE  NEGATIVE Final   CULTURE, BLOOD (ROUTINE X 2)     Status: Normal   Collection Time   01/19/12 10:46 AM      Component Value Range Status Comment   Specimen Description BLOOD LEFT ARM   Final    Special Requests BOTTLES DRAWN AEROBIC AND ANAEROBIC 10CC   Final    Culture  Setup Time 01/19/2012 16:55   Final    Culture NO GROWTH 5  DAYS   Final    Report Status 01/25/2012 FINAL   Final   CULTURE, BLOOD (ROUTINE X 2)     Status: Normal   Collection Time   01/19/12 10:48 AM      Component Value Range Status Comment   Specimen Description BLOOD LEFT ARM   Final    Special Requests BOTTLES DRAWN AEROBIC AND ANAEROBIC 10CC   Final    Culture  Setup Time 01/19/2012 16:55   Final    Culture NO GROWTH 5 DAYS   Final    Report Status 01/25/2012 FINAL   Final   CULTURE, ROUTINE-ABSCESS     Status: Normal (Preliminary result)   Collection Time   01/24/12  1:10 PM      Component Value Range Status Comment   Specimen Description PERITONEAL CAVITY   Final    Special Requests NONE   Final    Gram Stain     Final    Value: FEW WBC PRESENT, PREDOMINANTLY PMN     NO SQUAMOUS EPITHELIAL CELLS SEEN     ABUNDANT GRAM NEGATIVE RODS     ABUNDANT GRAM POSITIVE RODS     FEW GRAM VARIABLE ROD   Culture NO GROWTH 1 DAY   Final    Report Status PENDING  Incomplete   CULTURE, ROUTINE-ABSCESS     Status: Normal (Preliminary result)   Collection Time   01/24/12  2:07 PM      Component Value Range Status Comment   Specimen Description PERITONEAL CAVITY   Final    Special Requests NONE   Final    Gram Stain     Final    Value: RARE WBC PRESENT, PREDOMINANTLY PMN     NO SQUAMOUS EPITHELIAL CELLS SEEN     ABUNDANT GRAM POSITIVE RODS     MODERATE GRAM NEGATIVE RODS     MODERATE GRAM VARIABLE ROD   Culture NO GROWTH   Final    Report Status PENDING   Incomplete     Studies/Results: Ct Head Wo Contrast  01/23/2012  *RADIOLOGY REPORT*  Clinical Data:  Altered mental status after abdominal surgery.  CT HEAD WITHOUT CONTRAST  Technique:  Contiguous axial images were obtained from the base of the skull through the vertex without contrast  Comparison:  None.  Findings:  The brain has a normal appearance without evidence for hemorrhage, acute infarction, hydrocephalus, or mass lesion.  There is no extra axial fluid collection.  The skull and  paranasal sinuses are normal.  IMPRESSION: Normal CT of the head without contrast.   Original Report Authenticated By: Elsie Stain, M.D.    Ct Guided Abscess Drain  01/24/2012  *RADIOLOGY REPORT*  Clinical Data/Indication: LEFT ABDOMINAL ABSCESS  CT GUIDED ABCESS DRAINAGE WITH CATHETER  Sedation: Versed one mg, Fentanyl 50 mg.  Total Moderate Sedation Time: 15 minutes.  Procedure: The procedure, risks, benefits, and alternatives were explained to the patient. Questions regarding the procedure were encouraged and answered. The patient understands and consents to the procedure.  The left flank was prepped with betadine in a sterile fashion, and a sterile drape was applied covering the operative field. A sterile gown and sterile gloves were used for the procedure.  Under CT guidance, an 18 gauge needle was inserted into the left abdominal abscess.  It was removed over an Amplatz.  A 12-French dilator followed by a 12-French drain were inserted.  It was looped and string fixed in the abscess cavity and sewn to the skin. Dark cloudy pus was aspirated.  Findings: Imaging demonstrates a 12-French abscess drain placement in the a left abdominal fluid collection.  Complications: None.  IMPRESSION: Successful left sided abdominal abscess drainage.   Original Report Authenticated By: Donavan Burnet, M.D.    Ct Abdomen Pelvis W Contrast  01/23/2012  *RADIOLOGY REPORT*  Clinical Data: Status post colon surgery for diverticulitis. Evaluate for abscess.  CT ABDOMEN AND PELVIS WITH CONTRAST  The colon has been transected.  The proximal elongated and the of the distal colon lies in the left mid abdomen correctly adjacent to this collection.  The proximal colon is diverted, along the left transverse colon, into the left mid abdomen colostomy.  The remaining right colon is unremarkable.  The remaining left colon shows no wall thickening or inflammatory changes.  Adjacent to the collection and intermixed with small bubbles of  extraluminal air is inflamatory-type stranding.   There are mildly dilated loops of proximal small bowel with air- fluid levels.  Maximal dilation of the small bowel is 4 cm.  A discrete transition point is not seen.  The small bowel does appear thick-walled where it contacts an additional small collection that lies along the anterior left mid abdomen.  The second and separate collection measures 8.4 cm x 2.8 cm transversely  and measures 9 cm in length.  It contains fluid and nondependent air.  There are small left and minimal right pleural effusions.  There is dependent atelectasis at the lung bases.  The heart is normal in size.  The liver shows mild diffuse fatty infiltration but is otherwise unremarkable.  Normal spleen, gallbladder and pancreas. No bile duct dilation.  No adrenal masses. The kidneys and ureters are unremarkable.  The bladder is decompressed by Foley catheter. No enlarged lymph nodes.  IMPRESSION: There are collections in the left abdomen, the largest extending along the left pericolic gutter into the left upper quadrant and adjacent to the greater curvature of the stomach.  A smaller collection lies in the left anterior mid to lower abdomen.  Small bowel loops adjacent to the smaller collection appear thick-walled and there is a small bowel dilation proximal to this.  This is likely a mild focal adynamic ileus due to the small bowel wall inflammation at the level of the collection.  The other collection is in close proximity to the remaining left colon proximal anastomose the staple line.  This may potentially be leaking. The collections are consistent with abscesses in the proper clinical setting.  No other acute findings.  The lung bases show small left minimal right pleural effusions and lung base atelectasis.   Original Report Authenticated By: Domenic Moras, M.D.    Dg Chest Port 1 View  01/25/2012  *RADIOLOGY REPORT*  Clinical Data: 41 year old male with respiratory failure.   PORTABLE CHEST - 1 VIEW  Comparison: 01/24/2012 and earlier.  Findings: Portable semi upright AP view at 0509 hours.  Stable endotracheal tube tip at the level of clavicles.  Enteric tube courses to the abdomen.  Stable left subclavian central line. Continued very low lung volumes.  Perihilar and left basilar opacity is stable and most resembles atelectasis.  No pneumothorax or large effusion.  IMPRESSION:  1. Stable lines and tubes. 2.  Stable low lung volumes with atelectasis.   Original Report Authenticated By: Harley Hallmark, M.D.    Dg Chest Port 1 View  01/24/2012  *RADIOLOGY REPORT*  Clinical Data: 41 year old male with respiratory failure with hypoxia.  PORTABLE CHEST - 1 VIEW  Comparison: 01/23/2012 and earlier.  Findings: Portable semi upright AP view 0516 hours.  Stable endotracheal tube.  Stable left subclavian central line.  Stable visualized enteric tube. Continued low lung volumes, not significantly changed.  Perihilar and basilar atelectasis not significantly changed.  No pneumothorax or definite effusion.  Stable cardiac size and mediastinal contours.  IMPRESSION:  1. Stable lines and tubes. 2.  Stable low lung volumes and atelectasis.   Original Report Authenticated By: Harley Hallmark, M.D.     Anti-infectives: Anti-infectives     Start     Dose/Rate Route Frequency Ordered Stop   01/25/12 1045   vancomycin (VANCOCIN) 1,250 mg in sodium chloride 0.9 % 250 mL IVPB        1,250 mg 166.7 mL/hr over 90 Minutes Intravenous 2 times daily 01/25/12 1020     01/23/12 1300   micafungin (MYCAMINE) 100 mg in sodium chloride 0.9 % 100 mL IVPB        100 mg 100 mL/hr over 1 Hours Intravenous Daily 01/23/12 1158     01/16/12 1800   piperacillin-tazobactam (ZOSYN) IVPB 3.375 g        3.375 g 12.5 mL/hr over 240 Minutes Intravenous 3 times per day 01/16/12 1721 01/27/12 0930  Assessment/Plan: s/p LLQ abscess drainage 10/8; check final cx's; cont drain flushes; check f/u CT once  output declines; will need drain injection before removal   LOS: 9 days    Rakwon Letourneau,D [facility] 01/25/2012

## 2012-01-25 NOTE — Consult Note (Addendum)
WOC consult Note Reason for Consult: Wound VAC change Wound type:Midline surgical Pressure Ulcer POA: No Drainage (amount, consistency, odor) moderate amount serosanguinous exudate in cannister Periwound:intact Dressing procedure/placement/frequency: Continue Wound VAC at continuous pressure. Routine changes every M-W-F. 1 piece of black foam used as a wound contact layer. Patient tolerated dressing change well. Staff may change this VAC.     WOC ostomy consult  Stoma type/location: LLQ end Stoma (colostomy) Output thin, light brown stool in pouch. Ostomy pouching: 1pc. Pouch with built-in convexity and barrier ring applied on Monday (48 hours ago) is still intact.  I will not change today and would plan change for Friday with VAC dressing change. Education provided: None.  No family in room and patient is still sedated.  WOC Team will continue to follow.. Thanks, Ladona Mow, MSN, RN, [facility], CWOCN [phone])

## 2012-01-25 NOTE — Clinical Social Work Note (Signed)
CSW tried to check on family this am. Pt's mother present, but on the phone. CSW left card and will check on extended family to offer support and resources.   Doreen Salvage, LCSWA ICU/Stepdown Clinical Social Worker [facility] Cell [phone] Hours 8am-1200pm M-F

## 2012-01-26 ENCOUNTER — Inpatient Hospital Stay (HOSPITAL_COMMUNITY): Payer: Medicaid Other

## 2012-01-26 DIAGNOSIS — R5381 Other malaise: Secondary | ICD-10-CM

## 2012-01-26 DIAGNOSIS — R109 Unspecified abdominal pain: Secondary | ICD-10-CM

## 2012-01-26 LAB — MAGNESIUM: Magnesium: 2.6 mg/dL — ABNORMAL HIGH (ref 1.5–2.5)

## 2012-01-26 LAB — COMPREHENSIVE METABOLIC PANEL WITH GFR
ALT: 230 U/L — ABNORMAL HIGH (ref 0–53)
AST: 198 U/L — ABNORMAL HIGH (ref 0–37)
Albumin: 2.1 g/dL — ABNORMAL LOW (ref 3.5–5.2)
Alkaline Phosphatase: 79 U/L (ref 39–117)
BUN: 25 mg/dL — ABNORMAL HIGH (ref 6–23)
CO2: 21 meq/L (ref 19–32)
Calcium: 8 mg/dL — ABNORMAL LOW (ref 8.4–10.5)
Chloride: 112 meq/L (ref 96–112)
Creatinine, Ser: 0.75 mg/dL (ref 0.50–1.35)
GFR calc Af Amer: >90 mL/min
GFR calc non Af Amer: >90 mL/min
Glucose, Bld: 123 mg/dL — ABNORMAL HIGH (ref 70–99)
Potassium: 4 meq/L (ref 3.5–5.1)
Sodium: 144 meq/L (ref 135–145)
Total Bilirubin: 0.6 mg/dL (ref 0.3–1.2)
Total Protein: 6.8 g/dL (ref 6.0–8.3)

## 2012-01-26 LAB — PHOSPHORUS: Phosphorus: 2.5 mg/dL (ref 2.3–4.6)

## 2012-01-26 LAB — CBC WITH DIFFERENTIAL/PLATELET
Basophils Absolute: 0.1 K/uL (ref 0.0–0.1)
Basophils Relative: 1 % (ref 0–1)
Eosinophils Absolute: 0.1 K/uL (ref 0.0–0.7)
Eosinophils Relative: 1 % (ref 0–5)
HCT: 37.5 % — ABNORMAL LOW (ref 39.0–52.0)
Hemoglobin: 11.9 g/dL — ABNORMAL LOW (ref 13.0–17.0)
Lymphocytes Relative: 14 % (ref 12–46)
Lymphs Abs: 1.4 K/uL (ref 0.7–4.0)
MCH: 29.9 pg (ref 26.0–34.0)
MCHC: 31.7 g/dL (ref 30.0–36.0)
MCV: 94.2 fL (ref 78.0–100.0)
Monocytes Absolute: 1.2 K/uL — ABNORMAL HIGH (ref 0.1–1.0)
Monocytes Relative: 12 % (ref 3–12)
Neutro Abs: 7 K/uL (ref 1.7–7.7)
Neutrophils Relative %: 72 % (ref 43–77)
Platelets: 328 K/uL (ref 150–400)
RBC: 3.98 MIL/uL — ABNORMAL LOW (ref 4.22–5.81)
RDW: 13.3 % (ref 11.5–15.5)
WBC: 9.8 K/uL (ref 4.0–10.5)

## 2012-01-26 LAB — GLUCOSE, CAPILLARY
Glucose-Capillary: 104 mg/dL — ABNORMAL HIGH (ref 70–99)
Glucose-Capillary: 113 mg/dL — ABNORMAL HIGH (ref 70–99)
Glucose-Capillary: 121 mg/dL — ABNORMAL HIGH (ref 70–99)
Glucose-Capillary: 137 mg/dL — ABNORMAL HIGH (ref 70–99)

## 2012-01-26 LAB — COMPREHENSIVE METABOLIC PANEL
ALT: 198 U/L — ABNORMAL HIGH (ref 0–53)
Alkaline Phosphatase: 70 U/L (ref 39–117)
BUN: 28 mg/dL — ABNORMAL HIGH (ref 6–23)
CO2: 21 mEq/L (ref 19–32)
Chloride: 112 mEq/L (ref 96–112)
GFR calc Af Amer: >90 mL/min (ref >90)
Glucose, Bld: 106 mg/dL — ABNORMAL HIGH (ref 70–99)
Potassium: 3.8 mEq/L (ref 3.5–5.1)
Sodium: 143 mEq/L (ref 135–145)
Total Bilirubin: 0.6 mg/dL (ref 0.3–1.2)

## 2012-01-26 MED ORDER — SODIUM CHLORIDE 0.9 % IJ SOLN
10.0000 mL | Freq: Two times a day (BID) | INTRAMUSCULAR | Status: DC
  Administered 2012-01-26 – 2012-01-30 (×7): 10 mL
  Administered 2012-01-31: 20 mL
  Administered 2012-02-01 – 2012-02-03 (×5): 10 mL
  Administered 2012-02-06: 20 mL
  Administered 2012-02-07 – 2012-02-13 (×3): 10 mL

## 2012-01-26 MED ORDER — SODIUM CHLORIDE 0.9 % IJ SOLN
10.0000 mL | INTRAMUSCULAR | Status: DC | PRN
  Administered 2012-01-30 – 2012-02-01 (×3): 10 mL
  Administered 2012-02-02: 20 mL
  Administered 2012-02-02 – 2012-02-10 (×5): 10 mL
  Administered 2012-02-11 – 2012-02-12 (×3): 20 mL

## 2012-01-26 NOTE — Significant Event (Signed)
Pt self-extubated.  Hemodynamics, respiratory rate, WOB, and oxygenation acceptable at present.  He is interactive, following commands, and is able to speak in short sentences.  Will continue supplemental oxygen to keep SpO2 > 92%.  Defer re-intubation for now, and monitor.  Coralyn Helling, MD 01/26/2012, 1:44 AM [phone]

## 2012-01-26 NOTE — Clinical Social Work Psychosocial (Signed)
Clinical Social Work Department BRIEF PSYCHOSOCIAL ASSESSMENT 01/26/2012  Patient:  [name], [name]     Account Number:  [id number]     Admit date:  01/16/2012  Clinical Social Worker:  Jodelle Red  Date/Time:  01/26/2012 10:39 AM  Referred by:  CSW  Date Referred:  01/25/2012 Referred for  Other - See comment   Other Referral:   SUPPORT TO FAMILY  D/C PLANNING DISCUSSION, ASSESS NEEDS/SUPPORT   Interview type:  Family Other interview type:   Pt participated some in discussion, but was very quiet. Pt's father present and answered most questions.  Chart review, discussion with RN    PSYCHOSOCIAL DATA Living Status:  FAMILY Admitted from facility:   Level of care:   Primary support name:  Merita Norton Primary support relationship to patient:   Degree of support available:   good from extended family    CURRENT CONCERNS Current Concerns  Adjustment to Illness   Other Concerns:    SOCIAL WORK ASSESSMENT / PLAN CSW introduced self and inquired about help at home. Pt lives with wife, but she has been helping her mother at her mother's home some while Pt has been in the hospital. Per Pt's father, they live near by and check on Pt regularly. They are also available to help Pt at home as needed.   Assessment/plan status:  Psychosocial Support/Ongoing Assessment of Needs Other assessment/ plan:   Information/referral to community resources:   Artist for [employer] application    PATIENT'S/FAMILY'S RESPONSE TO PLAN OF CARE: Family appreciative and agree to CSW following for possible needs at home. CSW to follow and assist.   Doreen Salvage, LCSWA ICU/Stepdown Clinical Social Worker [facility] Cell [phone] Hours 8am-1200pm M-F

## 2012-01-26 NOTE — Evaluation (Signed)
Physical Therapy Evaluation Patient Details Name: [name] MRN: [id number] DOB: 09/29/1970 Today's Date: 01/26/2012 Time: 1191-4782 PT Time Calculation (min): 28 min  PT Assessment / Plan / Recommendation Clinical Impression  Pt. is 41 yo admitted 9/30 for complications of diverticulitis, bowel obstruction. S/P exploratory lap, VDRF. Pt. self extubated self 10/9. Pt. will benefit fromPT to improve in strength and functional mobility. Pt. may require post acute rehab. will see how pt. progresses. currently has profound weakness of all extremeties.    PT Assessment  Patient needs continued PT services    Follow Up Recommendations   (will need to see how pt progress, CIR may be indicated.)    Does the patient have the potential to tolerate intense rehabilitation   No, Recommend SNF  Barriers to Discharge        Equipment Recommendations  Rolling walker with 5" wheels    Recommendations for Other Services OT consult   Frequency Min 3X/week    Precautions / Restrictions Precautions Precautions: Fall   Pertinent Vitals/Pain Hr 115, RR 42, sats .96% 3.5 l.      Mobility  Bed Mobility Bed Mobility: Rolling Right;Rolling Left Rolling Right: 1: +2 Total assist;With rail Rolling Right: Patient Percentage: 10% Rolling Left: 1: +2 Total assist;With rail Rolling Left: Patient Percentage: 10% Details for Bed Mobility Assistance: assistance to place and hold Legs in flexion to assist with rolling, legs would flop  without control.Pt required assistanct to reach for rail, could not maintain grip on rail. Arm flops. Transfers Transfers: Not assessed Details for Transfer Assistance: maxisky back to bed due to weakness.    Shoulder Instructions     Exercises     PT Diagnosis: Difficulty walking;Generalized weakness  PT Problem List: Decreased strength;Decreased activity tolerance;Decreased mobility;Decreased safety awareness;Decreased knowledge of precautions;Decreased  coordination;Decreased cognition;Cardiopulmonary status limiting activity;Obesity PT Treatment Interventions: DME instruction;Gait training;Functional mobility training;Therapeutic activities;Therapeutic exercise;Patient/family education   PT Goals Acute Rehab PT Goals PT Goal Formulation: Patient unable to participate in goal setting Time For Goal Achievement: 02/09/12 Potential to Achieve Goals: Good Pt will Roll Supine to Right Side: with min assist;with rail PT Goal: Rolling Supine to Right Side - Progress: Goal set today Pt will Roll Supine to Left Side: with min assist;with rail PT Goal: Rolling Supine to Left Side - Progress: Goal set today Pt will go Supine/Side to Sit: with +2 total assist;with rail (pt= 50%) PT Goal: Supine/Side to Sit - Progress: Goal set today Pt will Sit at [facility] of Bed: with min assist;3-5 min;with bilateral upper extremity support PT Goal: Sit at Edge Of Bed - Progress: Goal set today Pt will go Sit to Supine/Side: with +2 total assist (pt=50%) PT Goal: Sit to Supine/Side - Progress: Goal set today Pt will go Sit to Stand: with +2 total assist (pt=50%) PT Goal: Sit to Stand - Progress: Goal set today Pt will go Stand to Sit: with +2 total assist (pt=50) PT Goal: Stand to Sit - Progress: Goal set today Pt will Transfer Bed to Chair/Chair to Bed: with +2 total assist (pt=50) PT Transfer Goal: Bed to Chair/Chair to Bed - Progress: Goal set today  Visit Information  Last PT Received On: 01/26/12 Assistance Needed: +2    Subjective Data  Subjective: "My pride" when asked if he hurt anywhere. Patient Stated Goal: Per father" to go home"   Prior Functioning  Home Living Lives With: Spouse Available Help at Discharge: Family Type of Home: House Home Layout: One level Bathroom Shower/Tub: Tub/shower  unit Bathroom Toilet: Standard Home Adaptive Equipment: None Additional Comments: pt was loing distance truck driver. Prior Function Vocation: Full time  employment Communication Communication:  (voice a whisper)    Cognition  Overall Cognitive Status: Impaired Area of Impairment: Attention;Memory;Following commands;Safety/judgement;Awareness of deficits Arousal/Alertness: Awake/alert Orientation Level: Disoriented to;Place;Time;Situation Behavior During Session: Restless Current Attention Level: Alternating Following Commands: Follows one step commands inconsistently;Follows one step commands with increased time Safety/Judgement: Decreased awareness of safety precautions;Impulsive;Decreased awareness of need for assistance    Extremity/Trunk Assessment Right Lower Extremity Assessment RLE ROM/Strength/Tone: Deficits RLE ROM/Strength/Tone Deficits: strength 2+/5 at best for hip flexion and knee extension. deemed not strong enough to stand RLE Sensation: WFL - Light Touch Left Lower Extremity Assessment LLE ROM/Strength/Tone: Deficits LLE ROM/Strength/Tone Deficits: same as RLE LLE Sensation: WFL - Light Touch   Balance    End of Session PT - End of Session Activity Tolerance: Patient limited by fatigue Patient left: in bed;with call bell/phone within reach;with bed alarm set;with family/visitor present Nurse Communication: Mobility status;Need for lift equipment  GP     Rada Hay 01/26/2012, 10:18 AM  [phone]

## 2012-01-26 NOTE — Evaluation (Signed)
Clinical/Bedside Swallow Evaluation Patient Details  Name: [name] MRN: [id number] Date of Birth: Sep 11, 1970  Today's Date: 01/26/2012 Time: 4098-1191 SLP Time Calculation (min): 37 min  Past Medical History:  Past Medical History  Diagnosis Date  . Urinary anastomotic stricture undiagnosed     hard to be cathed; urology did the last time  . Diverticulitis   . Colon obstruction 01/17/2012  . Obesity, Class III, BMI 40-49.9 (morbid obesity) 01/17/2012   Past Surgical History:  Past Surgical History  Procedure Date  . No past surgeries   . Partial colectomy 01/17/2012    Procedure: PARTIAL COLECTOMY;  Surgeon: Clovis Pu. Cornett, MD;  Location: [facility] ORS;  Service: General;  Laterality: N/A;  . 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  Post op respiratory failure noted requiring intubation on 10/1-10/10 when pt self extubated.  Problem list includes tobacco use, pulm edema,    Assessment / Plan / Recommendation Clinical Impression  Pt with phonatory deficits (aphonia) - ? edema from prolonged intubation and self-extubation and rapid respiratory rate (up to high 40's) impacting his ability to protect his airway with po intake.  Overt clinical indication of aspiration with water via straw characterized by strong cough immediately after the swallow.  Mild delay in swallow initiation observed - possibly cognitive based with pt's encepalopathy.  Intermittent wet phonation noted after swallow of jello and thin.   Rec initiate SMALL amounts only of clears - via tsp - with strict aspiration precautions with continued use of NG tube.  SLP to follow clinically to help with readiness for dietary advancement.  Father and pt's nephew thoroughly educated to  precautions and intake in SMALL amounts for comfort only.  Anticipate swallow to recover quickly with medical recovery and NG removal.      Aspiration Risk  Moderate    Diet Recommendation Thin liquid (small amounts)   Liquid Administration via: Spoon Medication Administration: Via alternative means Supervision: Trained caregiver to feed patient Compensations: Slow rate;Small sips/bites;Clear throat intermittently (clear voice when sounds gurgly-stop intake if dypneic ) Postural Changes and/or Swallow Maneuvers: Upright 30-60 min after meal;Seated upright 90 degrees    Other  Recommendations Oral Care Recommendations: Oral care BID   Follow Up Recommendations       Frequency and Duration min 2x/week  1 week   Pertinent Vitals/Pain Afebrile, decreased, 4 liters oxygen, rapid respiratory rate    SLP Swallow Goals Patient will utilize recommended strategies during swallow to increase swallowing safety with: Moderate assistance   Swallow Study Prior Functional Status       General Date of Onset: 01/26/12 HPI: 41 yo male adm to [facility] 01/16/12 with N/V and abdomen pain, 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  Post op respiratory failure noted requiring intubation on 10/1-10/10 when pt self extubated.  Problem list includes tobacco use, pulm edema,  Type of Study: Bedside swallow evaluation Diet Prior to this Study: NPO (NG Tube) Temperature Spikes Noted: No Respiratory Status: Supplemental O2 delivered via (comment) (4 liters) History of Recent Intubation: Yes Length of Intubations (days): 10 days Date extubated: 01/26/12 (self extubated) Behavior/Cognition: Alert;Cooperative;Pleasant mood;Confused Oral Cavity - Dentition: Edentulous (does not have dentures) Self-Feeding Abilities: Total assist  Patient Positioning: Upright in chair Baseline Vocal Quality: Low vocal intensity;Breathy Volitional Cough: Strong Volitional Swallow: Able to  elicit    Oral/Motor/Sensory Function Overall Oral Motor/Sensory Function: Impaired   Ice Chips Ice chips: Not tested   Thin Liquid Thin Liquid: Impaired Presentation: Cup;Spoon;Straw;Self Fed (hand over hand assist) Oral Phase Impairments: Impaired anterior to posterior transit Oral Phase Functional Implications: Prolonged oral transit Pharyngeal  Phase Impairments: Cough - Immediate;Wet Vocal Quality (cleared with cued throat ) Other Comments: overt cough when consuming water via straw - better tolerance via cup/tsp -     Nectar Thick Nectar Thick Liquid: Not tested   Honey Thick Honey Thick Liquid: Not tested   Puree Puree: Impaired Presentation: Spoon Oral Phase Functional Implications: Prolonged oral transit Pharyngeal Phase Impairments: Suspected delayed Swallow Other Comments: JELLO:  3 tsps   Solid   GO    Solid: Not tested       Donavan Burnet, MS [facility] SLP [phone]

## 2012-01-26 NOTE — Progress Notes (Signed)
RT called to patients room . ET tube pulled out to about 16cm tried to advance tube back in to 25cm but could not. ET tube pulled the rest of the way out. Patient stable and alert. Patient placed on NRB for 15 minutes with 100%. Patient respiratory rate in the 30's and is 96% on 4L nasal cannula. RT will continue to monitor.

## 2012-01-26 NOTE — Plan of Care (Signed)
Problem: Phase II Progression Outcomes Goal: Date pt extubated/weaned off vent Outcome: Completed/Met Date Met:  01/26/12 01/26/12; 0200 Goal: Time pt extubated/weaned off vent Outcome: Completed/Met Date Met:  01/26/12 0200  Comments:  Patient self extubated

## 2012-01-26 NOTE — Evaluation (Signed)
Occupational Therapy Evaluation Patient Details Name: [name] MRN: [id number] DOB: 05-Mar-1971 Today's Date: 01/26/2012 Time: 1478-2956 OT Time Calculation (min): 24 min  OT Assessment / Plan / Recommendation Clinical Impression  Pt. is 41 yo admitted 9/30 for complications of diverticulitis, bowel obstruction. S/P exploratory lap, VDRF. Pt. self extubated self 10/9. Skilled OT indicated to maximize independence with BADLs in prep for d/c to next venue. CIR may be indicated. Need to see how pt progresses. Pt will likely need some sort of f/u therapy at d/c.    OT Assessment  Patient needs continued OT Services    Follow Up Recommendations  Skilled nursing facility;Supervision/Assistance - 24 hour;Inpatient Rehab (depending on pt progress.)    Barriers to Discharge Decreased caregiver support;Inaccessible home environment    Equipment Recommendations  Rolling walker with 5" wheels;3 in 1 bedside comode    Recommendations for Other Services    Frequency  Min 2X/week    Precautions / Restrictions Precautions Precautions: Fall   Pertinent Vitals/Pain Pt denied pain but grimaced and groaned throughout session.    ADL  Grooming: Simulated;Maximal assistance Where Assessed - Grooming: Supported sitting Toileting - Architect and Hygiene: Simulated;+2 Total assistance Toileting - Architect and Hygiene: Patient Percentage: 0% Where Assessed - Toileting Clothing Manipulation and Hygiene: Supine, head of bed flat;Rolling right and/or left ADL Comments: Pt found slumped/slouched in chair upon arrival. Flat affect. Pt extremely weak and deconditioned.    OT Diagnosis: Generalized weakness  OT Problem List: Decreased strength;Decreased safety awareness;Decreased activity tolerance;Decreased cognition;Decreased knowledge of use of DME or AE;Pain;Impaired UE functional use OT Treatment Interventions: Self-care/ADL training;Therapeutic activities;DME and/or AE  instruction;Patient/family education   OT Goals Acute Rehab OT Goals OT Goal Formulation: With patient/family Time For Goal Achievement: 02/09/12 Potential to Achieve Goals: Good ADL Goals Pt Will Perform Grooming: with set-up;Supported;Sitting, chair (x 3 tasks to improve activity tolerance.) ADL Goal: Grooming - Progress: Goal set today Pt Will Transfer to Toilet: with mod assist;3-in-1;Squat pivot transfer;Stand pivot transfer ADL Goal: Toilet Transfer - Progress: Goal set today Additional ADL Goal #1: Pt will complete supine>sit with mod A in prep for ADLs. ADL Goal: Additional Goal #1 - Progress: Goal set today Additional ADL Goal #2: Pt will tolerate sitting EOB unsupported with minguard A x 8 min in prep for seated ADL. ADL Goal: Additional Goal #2 - Progress: Goal set today Arm Goals Pt Will Perform AROM: with supervision, verbal cues required/provided;Bilateral upper extremities;to maintain range of motion;2 sets;10 reps Arm Goal: AROM - Progress: Goal set today Miscellaneous OT Goals Miscellaneous OT Goal #1: Pt will tolerate 20-30 min of TA with minimal RBs in prep for ADLs. OT Goal: Miscellaneous Goal #1 - Progress: Goal set today  Visit Information  Last OT Received On: 01/26/12 Assistance Needed: +2 PT/OT Co-Evaluation/Treatment: Yes    Subjective Data  Subjective: My pride hurts. Patient Stated Goal: Pt unable to state goal at this time. Remained quiet most of time.   Prior Functioning     Home Living Lives With: Spouse Available Help at Discharge: Family Type of Home: House Home Layout: One level Bathroom Shower/Tub: Engineer, manufacturing systems: Standard Home Adaptive Equipment: None Additional Comments: pt was loing distance truck driver. Prior Function Level of Independence: Independent Able to Take Stairs?: Yes Driving: Yes Vocation: Full time employment Communication Communication:  (voice a whisper) Dominant Hand: Right           Vision/Perception     Cognition  Overall Cognitive Status: Impaired  Area of Impairment: Attention;Memory;Following commands;Safety/judgement;Awareness of deficits Arousal/Alertness: Awake/alert Orientation Level: Disoriented to;Place;Time;Situation Behavior During Session: Restless Current Attention Level: Alternating Following Commands: Follows one step commands inconsistently;Follows one step commands with increased time Safety/Judgement: Decreased awareness of safety precautions;Impulsive;Decreased awareness of need for assistance    Extremity/Trunk Assessment Right Upper Extremity Assessment RUE ROM/Strength/Tone: Deficits RUE ROM/Strength/Tone Deficits: Strength at best 3/5 grossly Left Upper Extremity Assessment LUE ROM/Strength/Tone: Deficits LUE ROM/Strength/Tone Deficits: Strength at best 3/5 grossly Right Lower Extremity Assessment RLE ROM/Strength/Tone: Deficits RLE ROM/Strength/Tone Deficits: strength 2+/5 at best for hip flexion and knee extension. deemed not strong enough to stand RLE Sensation: WFL - Light Touch Left Lower Extremity Assessment LLE ROM/Strength/Tone: Deficits LLE ROM/Strength/Tone Deficits: same as RLE LLE Sensation: WFL - Light Touch     Mobility Bed Mobility Bed Mobility: Rolling Right;Rolling Left Rolling Right: 1: +2 Total assist;With rail Rolling Right: Patient Percentage: 10% Rolling Left: 1: +2 Total assist;With rail Rolling Left: Patient Percentage: 10% Details for Bed Mobility Assistance: assistance to place and hold Legs in flexion to assist with rolling, legs would flop  without control.Pt required assistanct to reach for rail, could not maintain grip on rail. Arm flops. Transfers Details for Transfer Assistance: maxisky back to bed due to weakness.     Shoulder Instructions     Exercise     Balance     End of Session OT - End of Session Activity Tolerance: Patient limited by fatigue Patient left: in chair;with call bell/phone  within reach Nurse Communication: Need for lift equipment  GO     Jailyn Leeson A OTR/L [phone] 01/26/2012, 11:14 AM

## 2012-01-26 NOTE — Progress Notes (Signed)
Patient ID: SUHEYB RAUCCI, male   DOB: 04-07-1971, 41 y.o.   MRN: [id number] 9 Days Post-Op  Subjective: Pt self-extubated himself last night.  He is talking this morning, following commands, and appropriate, but a little slow to respond.  He says he feels like crap.  Very thirsty.  Objective: Vital signs in last 24 hours: Temp:  [98.5 F (36.9 C)-101.4 F (38.6 C)] 98.5 F (36.9 C) (10/10 0350) Pulse Rate:  [77-99] 96  (10/10 0700) Resp:  [23-37] 29  (10/10 0700) BP: (86-124)/(51-80) 124/74 mmHg (10/10 0700) SpO2:  [96 %-99 %] 97 % (10/10 0700) FiO2 (%):  [30 %] 30 % (10/09 2345) Weight:  [283 lb 4.7 oz (128.5 kg)] 283 lb 4.7 oz (128.5 kg) (10/10 0000) Last BM Date: 01/25/12  Intake/Output from previous day: 10/09 0701 - 10/10 0700 In: 4117.3 [I.V.:545.8; NG/GT:1920; IV Piggyback:876.5; TPN:770] Out: 3575 [Urine:2385; Drains:280; Stool:910] Intake/Output this shift: Total I/O In: 72.5 [NG/GT:60; IV Piggyback:12.5] Out: -   PE: Abd: soft, +BS, ostomy with liquid stool, drain with dark brown purulent output.  Tender Ht: regular, but tachy   Lab Results:   Basename 01/26/12 0545 01/24/12 0535  WBC 9.8 11.8*  HGB 11.9* 12.5*  HCT 37.5* 38.9*  PLT 328 309   BMET  Basename 01/26/12 0545 01/24/12 0535  NA 143 146*  K 3.8 3.6  CL 112 108  CO2 21 26  GLUCOSE 106* 138*  BUN 28* 28*  CREATININE 0.77 0.87  CALCIUM 8.0* 8.1*   PT/INR  Basename 01/24/12 0900  LABPROT 14.9  INR 1.19   CMP     Component Value Date/Time   NA 143 01/26/2012 0545   K 3.8 01/26/2012 0545   CL 112 01/26/2012 0545   CO2 21 01/26/2012 0545   GLUCOSE 106* 01/26/2012 0545   BUN 28* 01/26/2012 0545   CREATININE 0.77 01/26/2012 0545   CALCIUM 8.0* 01/26/2012 0545   PROT 6.4 01/26/2012 0545   ALBUMIN 2.0* 01/26/2012 0545   AST 210* 01/26/2012 0545   ALT 198* 01/26/2012 0545   ALKPHOS 70 01/26/2012 0545   BILITOT 0.6 01/26/2012 0545   GFRNONAA >90 01/26/2012 0545   GFRAA >90  01/26/2012 0545   Lipase     Component Value Date/Time   LIPASE 24 01/16/2012 1210   Studies/Results: Ct Guided Abscess Drain  01/24/2012  *RADIOLOGY REPORT*  Clinical Data/Indication: LEFT ABDOMINAL ABSCESS  CT GUIDED ABCESS DRAINAGE WITH CATHETER  Sedation: Versed one mg, Fentanyl 50 mg.  Total Moderate Sedation Time: 15 minutes.  Procedure: The procedure, risks, benefits, and alternatives were explained to the patient. Questions regarding the procedure were encouraged and answered. The patient understands and consents to the procedure.  The left flank was prepped with betadine in a sterile fashion, and a sterile drape was applied covering the operative field. A sterile gown and sterile gloves were used for the procedure.  Under CT guidance, an 18 gauge needle was inserted into the left abdominal abscess.  It was removed over an Amplatz.  A 12-French dilator followed by a 12-French drain were inserted.  It was looped and string fixed in the abscess cavity and sewn to the skin. Dark cloudy pus was aspirated.  Findings: Imaging demonstrates a 12-French abscess drain placement in the a left abdominal fluid collection.  Complications: None.  IMPRESSION: Successful left sided abdominal abscess drainage.   Original Report Authenticated By: Donavan Burnet, M.D.    Dg Chest Port 1 View  01/25/2012  *RADIOLOGY REPORT*  Clinical Data: 41 year old male with respiratory failure.  PORTABLE CHEST - 1 VIEW  Comparison: 01/24/2012 and earlier.  Findings: Portable semi upright AP view at 0509 hours.  Stable endotracheal tube tip at the level of clavicles.  Enteric tube courses to the abdomen.  Stable left subclavian central line. Continued very low lung volumes.  Perihilar and left basilar opacity is stable and most resembles atelectasis.  No pneumothorax or large effusion.  IMPRESSION:  1. Stable lines and tubes. 2.  Stable low lung volumes with atelectasis.   Original Report Authenticated By: Harley Hallmark, M.D.      Anti-infectives: Anti-infectives     Start     Dose/Rate Route Frequency Ordered Stop   01/25/12 1045   vancomycin (VANCOCIN) 1,250 mg in sodium chloride 0.9 % 250 mL IVPB        1,250 mg 166.7 mL/hr over 90 Minutes Intravenous 2 times daily 01/25/12 1020     01/23/12 1300   micafungin (MYCAMINE) 100 mg in sodium chloride 0.9 % 100 mL IVPB        100 mg 100 mL/hr over 1 Hours Intravenous Daily 01/23/12 1158     01/16/12 1800  piperacillin-tazobactam (ZOSYN) IVPB 3.375 g       3.375 g 12.5 mL/hr over 240 Minutes Intravenous 3 times per day 01/16/12 1721 01/27/12 0930           Assessment/Plan  1. S/p Hartman's for perf tics 2. VDRF, self-extubated 3. Post op abscess, s/p perc drain 4. Deconditioning 5. PCM/TF  Since gut is working, I think he'll do better with NGT out.  Missing food for a couple of days should not adversely effect him.  Plan: 1. Due to self-extubation, I have d/w CCM, we will obtain a bedside swallow study prior to oral intake.  if patient passes, will dc NGT and stop TFs and start clear liquids.  If he does not then, will cont TFs for now. 2. Will get OOB and mobilize.  PT/OT ordered 3. Cont drain and abx therapy.  His fevers have responded to perc drain.  Will need repeat CT scan this weekend or early next week. 4. Appreciate CCM assistance with patient's medical problems.   LOS: 10 days   OSBORNE,KELLY E 01/26/2012, 8:22 AM Pager: [phone]  Self extubated.  Somewhat short of breath, but doing okay. Tolerating tube feedings at 60 cc/hr.  Will d/c NGT and tube feedings.  If he does well, advance diet tomorrow. Father in room with patient.  Ovidio Kin, MD, [facility] Surgery Pager: [phone] Office phone:  [phone]

## 2012-01-26 NOTE — Progress Notes (Signed)
Name: [name] MRN: [id number] DOB: [birth year]    LOS: 10  Referring Provider:  CCS Reason for Referral:  Acute respiratory failure  PULMONARY / CRITICAL CARE MEDICINE  Brief patient description:  41 yo morbidly obese male with suspected OSA/OHS admitted 9/30 with perf bowel due to stricture, + peritonitis, s/p exploratory laparotomy with partial colectomy and colostomy (10/1).  The post op course has been complicated by hypoxemic respiratory failure requiring intubation.  Events Since Admission: 9/30  Admitted with abdominal pain, nausea and vomiting 10/1  Exploratory laparotomy, partial colectomy, colostomy; peritoneal abscess noted 10/2  Intubated for hypoxemic respiratory failure 10/7: persistent fevers, CT of abdomen and pelvis demonstrated large left-sided fluid collection, and the left abdomen. 10/8: Percutaneous drain placed in left lower quadrant fluid collection by interventional radiology 10/10: Self extubated over night, but tolerating [place] O2  Current Status: Guarded  Vital Signs: Temp:  [98.5 F (36.9 C)-101.4 F (38.6 C)] 99.5 F (37.5 C) (10/10 0800) Pulse Rate:  [77-109] 109  (10/10 0900) Resp:  [23-37] 35  (10/10 0900) BP: (86-124)/(51-80) 120/76 mmHg (10/10 0900) SpO2:  [95 %-99 %] 95 % (10/10 0900) FiO2 (%):  [30 %] 30 % (10/09 2345) Weight:  [128.5 kg (283 lb 4.7 oz)] 128.5 kg (283 lb 4.7 oz) (10/10 0000)  Physical Examination:  Gen: Obese, ill appearing male HEENT: NCAT, PERRL PULM: Fine scattered rhonchi CV: RRR, no mgr AB: BS+, wound vac and colostomy in place, left lower quadrant percutaneous drain in place appears to be draining feculent appearing matter Ext: warm, scds, trace edema Neuro: Alert, oriented x 2, follows commands,   Principal Problem:  *Colon obstruction Active Problems:  UTI (lower urinary tract infection)  Diverticulitis of colon without hemorrhage  Obesity, Class III, BMI 40-49.9 (morbid obesity)  Acute respiratory  failure with hypoxia  Acute pulmonary edema  Encephalopathy acute  Tobacco abuse  Intra-abdominal abscess, diverticular s/p surgical drainage  Hypertriglyceridemia  ASSESSMENT AND PLAN  PULMONARY  Lab 01/24/12 0345 01/23/12 0355 01/22/12 0408 01/21/12 0421 01/20/12 0422  PHART 7.489* 7.496* 7.442 7.433 7.407  PCO2ART 33.8* 41.7 50.6* 50.0* 52.3*  PO2ART 83.1 79.0* 94.4 82.1 105.0*  HCO3 25.0* 31.6* 33.4* 32.4* 31.9*  O2SAT 95.1 95.1 96.4 95.3 97.6   Ventilator Settings: Vent Mode:  [-] PRVC FiO2 (%):  [30 %] 30 % Set Rate:  [14 bmp] 14 bmp Vt Set:  [580 mL] 580 mL PEEP:  [5 cmH20] 5 cmH20 Plateau Pressure:  [19 cmH20-21 cmH20] 21 cmH20  CXR:  10/9>>> Stable low lung volumes with atelectasis. ETT:  10/2 >>>10/10 self extubated  A:   1) Acute hypoxemic / hypercarbic respiratory failure secondary to acute pulmonary edema , ALI and  Atelectasis. 2) Suspected OSA/OHS. P:   Mobilize with PT/OT OOB to chair Pulmonary Toilet See ID section Swallow Eval Just self extubated, monitor  CARDIOVASCULAR  Lab 01/21/12 0520 01/19/12 1850 01/19/12 1035  TROPONINI -- <0.30 <0.30  LATICACIDVEN -- -- 1.8  PROBNP 82.8 -- --   Lines: R [place] TLC 10/2 >>>  A: Hemodynamically stable.   P: PICC line placement Goal MAP > 60  RENAL  Lab 01/26/12 0545 01/24/12 0535 01/23/12 0420 01/22/12 0550 01/21/12 0520  NA 143 146* 150* 149* 146*  K 3.8 3.6 -- -- --  CL 112 108 108 107 105  CO2 21 26 32 34* 33*  BUN 28* 28* 27* 29* 25*  CREATININE 0.77 0.87 0.95 1.01 1.01  CALCIUM 8.0* 8.1* 8.6 8.2* 8.5  MG 2.6* -- 2.9* 2.8* 2.5  PHOS 2.5 -- 3.3 2.9 3.1   Intake/Output      10/09 0701 - 10/10 0700 10/10 0701 - 10/11 0700   I.V. (mL/kg) 545.8 (4.2) 20 (0.2)   Other 5    NG/GT 1920 120   IV Piggyback 876.5 12.5   TPN 770    Total Intake(mL/kg) 4117.3 (32) 152.5 (1.2)   Urine (mL/kg/hr) 2385 (0.8)    Drains 280 60   Stool 910 50   Total Output 3575 110   Net +542.3 +42.5           Intake/Output Summary (Last 24 hours) at 01/26/12 0948 Last data filed at 01/26/12 0900  Gross per 24 hour  Intake 3832.3 ml  Output   3410 ml  Net  422.3 ml   Foley:  10/1 >>>  A:   1) Hypernatremia Improved with free water replacement P:   Free water for hypernatremia, likely to dc in am   GASTROINTESTINAL  Lab 01/26/12 0545 01/24/12 0535 01/23/12 0420 01/21/12 0520  AST 210* 47* 34 22  ALT 198* 32 20 13  ALKPHOS 70 60 64 72  BILITOT 0.6 0.8 0.5 0.4  PROT 6.4 6.4 6.5 6.2  ALBUMIN 2.0* 2.0* 2.0* 1.9*   A:   Status post exploratory lap, partial colectomy, colostomy Perc drain for abdominal abscess - drainage from per drain decreasign  P:   Continue TF pending swallow eval today Post op care per CCS.  HEMATOLOGIC  Lab 01/26/12 0545 01/24/12 0900 01/24/12 0535 01/23/12 0420 01/22/12 0550 01/21/12 0520  HGB 11.9* -- 12.5* 13.1 11.7* 12.9*  HCT 37.5* -- 38.9* 40.6 37.5* 40.0  PLT 328 -- 309 361 329 378  INR -- 1.19 -- -- -- --  APTT -- 32 -- -- -- --   A:   1) Hemoconcentration? Diuresed and now negative almost 3 liters. P:  Trend CBC  INFECTIOUS  Lab 01/26/12 0545 01/24/12 0535 01/23/12 0420 01/22/12 0550 01/21/12 0520 01/20/12 0437  WBC 9.8 11.8* 12.4* 8.3 10.5 --  PROCALCITON -- -- -- -- -- 1.54   Cultures: Blood culture x2 10/3: negative Peritoneal fluid 10/8:abundant CPR, moderate GNR, moderate gram variable rod>>>  Antibiotics: Zosyn 9/30 >>> Micafungin 10/07>> Vancomycin 10/9>>>  A:   Peritonitis, diverticular abscess with persistent fevers. Left lower quadrant intra-abdominal abscess 10/7 status post cutaneous drain on 10/8 - fevers resolving and drainage has slowed  P:   Antibiotics / cultures as above - Adding Vanc, will followup on cultures and narrow antibiotics as sensitivities dictate Percutaneous drain management per surgery Follow up CT this weekend or Monday Keep myco  ENDOCRINE  Lab 01/26/12 0723 01/26/12 0331 01/25/12 2354  01/25/12 1924 01/25/12 1526  GLUCAP 113* 104* 134* 122* 131*   A:  Hyperglycemia. Glycemic control P:   Goal CBG 120 to 180 SSI  NEUROLOGIC  A:  Acute encephalopathy secondary to sedation. - Now resolved  Post Op pain P:   Supportive care Pain management  BEST PRACTICE / DISPOSITION Level of Care:  ICU Primary Service:  CCS Consultants:  PCCM Code Status:  Full Diet:  TF DVT Px:  Protonix GI Px:  Lovenox Skin Integrity:  Intact Social / Family:  Updated family at bedside   Mcarthur Rossetti. Tyson Alias, MD, FACP Pgr: [phone] [facility]

## 2012-01-26 NOTE — Progress Notes (Signed)
Peripherally Inserted Central Catheter/Midline Placement  The IV Nurse has discussed with the patient and/or persons authorized to consent for the patient, the purpose of this procedure and the potential benefits and risks involved with this procedure.  The benefits include less needle sticks, lab draws from the catheter and patient may be discharged home with the catheter.  Risks include, but not limited to, infection, bleeding, blood clot (thrombus formation), and puncture of an artery; nerve damage and irregular heat beat.  Alternatives to this procedure were also discussed.  PICC/Midline Placement Documentation        Timmothy Sours 01/26/2012, 6:02 PM

## 2012-01-26 NOTE — Progress Notes (Signed)
9 Days Post-Op  Subjective: Pt states he feels better; self extubated last night; father in room  Objective: Vital signs in last 24 hours: Temp:  [98.5 F (36.9 C)-101.4 F (38.6 C)] 98.5 F (36.9 C) (10/10 1200) Pulse Rate:  [77-110] 100  (10/10 1300) Resp:  [23-43] 37  (10/10 1300) BP: (86-129)/(51-81) 125/76 mmHg (10/10 1300) SpO2:  [95 %-99 %] 97 % (10/10 1300) FiO2 (%):  [30 %] 30 % (10/09 2345) Weight:  [283 lb 4.7 oz (128.5 kg)] 283 lb 4.7 oz (128.5 kg) (10/10 0000) Last BM Date: 01/26/12  Intake/Output from previous day: 10/09 0701 - 10/10 0700 In: 4117.3 [I.V.:545.8; NG/GT:1920; IV Piggyback:876.5; TPN:770] Out: 3575 [Urine:2385; Drains:280; Stool:910] Intake/Output this shift: Total I/O In: 969 [I.V.:20; NG/GT:570; IV Piggyback:379] Out: 1540 [Urine:1430; Drains:60; Stool:50]  LLQ drain intact, output 60 cc's today liquid stool, cx's pend  Lab Results:   Basename 01/26/12 0545 01/24/12 0535  WBC 9.8 11.8*  HGB 11.9* 12.5*  HCT 37.5* 38.9*  PLT 328 309   BMET  Basename 01/26/12 1155 01/26/12 0545  NA 144 143  K 4.0 3.8  CL 112 112  CO2 21 21  GLUCOSE 123* 106*  BUN 25* 28*  CREATININE 0.75 0.77  CALCIUM 8.0* 8.0*   PT/INR  Basename 01/24/12 0900  LABPROT 14.9  INR 1.19   ABG  Basename 01/24/12 0345  PHART 7.489*  HCO3 25.0*   Results for orders placed during the hospital encounter of 01/16/12  SURGICAL PCR SCREEN     Status: Normal   Collection Time   01/17/12  8:28 AM      Component Value Range Status Comment   MRSA, PCR NEGATIVE  NEGATIVE Final    Staphylococcus aureus NEGATIVE  NEGATIVE Final   CULTURE, BLOOD (ROUTINE X 2)     Status: Normal   Collection Time   01/19/12 10:46 AM      Component Value Range Status Comment   Specimen Description BLOOD LEFT ARM   Final    Special Requests BOTTLES DRAWN AEROBIC AND ANAEROBIC 10CC   Final    Culture  Setup Time 01/19/2012 16:55   Final    Culture NO GROWTH 5 DAYS   Final    Report Status  01/25/2012 FINAL   Final   CULTURE, BLOOD (ROUTINE X 2)     Status: Normal   Collection Time   01/19/12 10:48 AM      Component Value Range Status Comment   Specimen Description BLOOD LEFT ARM   Final    Special Requests BOTTLES DRAWN AEROBIC AND ANAEROBIC 10CC   Final    Culture  Setup Time 01/19/2012 16:55   Final    Culture NO GROWTH 5 DAYS   Final    Report Status 01/25/2012 FINAL   Final   CULTURE, ROUTINE-ABSCESS     Status: Normal (Preliminary result)   Collection Time   01/24/12  1:10 PM      Component Value Range Status Comment   Specimen Description PERITONEAL CAVITY   Final    Special Requests NONE   Final    Gram Stain     Final    Value: FEW WBC PRESENT, PREDOMINANTLY PMN     NO SQUAMOUS EPITHELIAL CELLS SEEN     ABUNDANT GRAM NEGATIVE RODS     ABUNDANT GRAM POSITIVE RODS     FEW GRAM VARIABLE ROD   Culture NO GROWTH 1 DAY   Final    Report Status PENDING  Incomplete   CULTURE, ROUTINE-ABSCESS     Status: Normal (Preliminary result)   Collection Time   01/24/12  2:07 PM      Component Value Range Status Comment   Specimen Description PERITONEAL CAVITY   Final    Special Requests NONE   Final    Gram Stain     Final    Value: RARE WBC PRESENT, PREDOMINANTLY PMN     NO SQUAMOUS EPITHELIAL CELLS SEEN     ABUNDANT GRAM POSITIVE RODS     MODERATE GRAM NEGATIVE RODS     MODERATE GRAM VARIABLE ROD   Culture Culture reincubated for better growth   Final    Report Status PENDING   Incomplete     Studies/Results: Dg Chest Port 1 View  01/25/2012  *RADIOLOGY REPORT*  Clinical Data: 41 year old male with respiratory failure.  PORTABLE CHEST - 1 VIEW  Comparison: 01/24/2012 and earlier.  Findings: Portable semi upright AP view at 0509 hours.  Stable endotracheal tube tip at the level of clavicles.  Enteric tube courses to the abdomen.  Stable left subclavian central line. Continued very low lung volumes.  Perihilar and left basilar opacity is stable and most resembles  atelectasis.  No pneumothorax or large effusion.  IMPRESSION:  1. Stable lines and tubes. 2.  Stable low lung volumes with atelectasis.   Original Report Authenticated By: Harley Hallmark, M.D.     Anti-infectives: Anti-infectives     Start     Dose/Rate Route Frequency Ordered Stop   01/25/12 1045   vancomycin (VANCOCIN) 1,250 mg in sodium chloride 0.9 % 250 mL IVPB        1,250 mg 166.7 mL/hr over 90 Minutes Intravenous 2 times daily 01/25/12 1020     01/23/12 1300   micafungin (MYCAMINE) 100 mg in sodium chloride 0.9 % 100 mL IVPB        100 mg 100 mL/hr over 1 Hours Intravenous Daily 01/23/12 1158     01/16/12 1800   piperacillin-tazobactam (ZOSYN) IVPB 3.375 g        3.375 g 12.5 mL/hr over 240 Minutes Intravenous 3 times per day 01/16/12 1721 01/27/12 0930          Assessment/Plan: s/p LLQ abscess drainage 10/8; cont current tx, check final cx's, monitor labs; check f/u CT once output declines; will need drain injection before removal   LOS: 10 days    Angelito Hopping,D [facility] 01/26/2012

## 2012-01-27 ENCOUNTER — Inpatient Hospital Stay (HOSPITAL_COMMUNITY): Payer: Medicaid Other

## 2012-01-27 LAB — CULTURE, ROUTINE-ABSCESS

## 2012-01-27 LAB — COMPREHENSIVE METABOLIC PANEL
ALT: 175 U/L — ABNORMAL HIGH (ref 0–53)
Calcium: 8.1 mg/dL — ABNORMAL LOW (ref 8.4–10.5)
GFR calc Af Amer: >90 mL/min (ref >90)
Glucose, Bld: 126 mg/dL — ABNORMAL HIGH (ref 70–99)
Sodium: 142 mEq/L (ref 135–145)
Total Protein: 6.4 g/dL (ref 6.0–8.3)

## 2012-01-27 LAB — GLUCOSE, CAPILLARY
Glucose-Capillary: 133 mg/dL — ABNORMAL HIGH (ref 70–99)
Glucose-Capillary: 141 mg/dL — ABNORMAL HIGH (ref 70–99)

## 2012-01-27 LAB — CBC
Hemoglobin: 11.5 g/dL — ABNORMAL LOW (ref 13.0–17.0)
MCH: 29.9 pg (ref 26.0–34.0)
MCHC: 31.9 g/dL (ref 30.0–36.0)
RDW: 13.2 % (ref 11.5–15.5)

## 2012-01-27 MED ORDER — FAT EMULSION 20 % IV EMUL
240.0000 mL | INTRAVENOUS | Status: AC
  Administered 2012-01-27: 240 mL via INTRAVENOUS
  Filled 2012-01-27: qty 250

## 2012-01-27 MED ORDER — VANCOMYCIN HCL 1000 MG IV SOLR
2000.0000 mg | Freq: Two times a day (BID) | INTRAVENOUS | Status: DC
  Administered 2012-01-27 – 2012-02-01 (×10): 2000 mg via INTRAVENOUS
  Filled 2012-01-27 (×11): qty 2000

## 2012-01-27 MED ORDER — VANCOMYCIN HCL 1000 MG IV SOLR: 2000.0000 mg | Freq: Two times a day (BID) | INTRAVENOUS | Status: DC

## 2012-01-27 MED ORDER — ZINC TRACE METAL 1 MG/ML IV SOLN: INTRAVENOUS | Status: DC

## 2012-01-27 MED ORDER — ZINC TRACE METAL 1 MG/ML IV SOLN
INTRAVENOUS | Status: AC
  Administered 2012-01-27: 17:00:00 via INTRAVENOUS

## 2012-01-27 MED ORDER — VANCOMYCIN HCL 1000 MG IV SOLR
750.0000 mg | Freq: Once | INTRAVENOUS | Status: DC
  Filled 2012-01-27: qty 750

## 2012-01-27 NOTE — Progress Notes (Signed)
Name: MAIKOL GRASSIA MRN: [id number] DOB: [birth year]    LOS: 11  Referring Provider:  CCS Reason for Referral:  Acute respiratory failure  PULMONARY / CRITICAL CARE MEDICINE  Brief patient description:  41 yo morbidly obese male with suspected OSA/OHS admitted 9/30 with perf bowel due to stricture, + peritonitis, s/p exploratory laparotomy with partial colectomy and colostomy (10/1).  The post op course has been complicated by hypoxemic respiratory failure requiring intubation.  Events Since Admission: 9/30  Admitted with abdominal pain, nausea and vomiting 10/1  Exploratory laparotomy, partial colectomy, colostomy; peritoneal abscess noted 10/2  Intubated for hypoxemic respiratory failure 10/7: persistent fevers, CT of abdomen and pelvis demonstrated large left-sided fluid collection, and the left abdomen. 10/8: Percutaneous drain placed in left lower quadrant fluid collection by interventional radiology 10/10: Self extubated over night, but tolerating [place] O2  Current Status: Guarded  Vital Signs: Temp:  [98.5 F (36.9 C)-101.8 F (38.8 C)] 98.9 F (37.2 C) (10/11 0800) Pulse Rate:  [78-115] 89  (10/11 1000) Resp:  [30-43] 31  (10/11 1000) BP: (113-139)/(69-94) 139/94 mmHg (10/11 1000) SpO2:  [94 %-98 %] 97 % (10/11 1000) 2 liters  Physical Examination:  Gen: Obese, ill appearing male HEENT: NCAT, PERRL PULM: decreased bases CV: RRR, no mgr AB: BS+, wound well granulated colostomy in place, but almost no output left lower quadrant percutaneous drain in place appears to be draining feculent appearing matter, this has slowed. Ext: warm, scds, trace edema Neuro: Alert, oriented x 2, follows commands,   Principal Problem:  *Colon obstruction Active Problems:  UTI (lower urinary tract infection)  Diverticulitis of colon without hemorrhage  Obesity, Class III, BMI 40-49.9 (morbid obesity)  Acute respiratory failure with hypoxia  Acute pulmonary edema  Encephalopathy  acute  Tobacco abuse  Intra-abdominal abscess, diverticular s/p surgical drainage  Hypertriglyceridemia  ASSESSMENT AND PLAN  PULMONARY  Lab 01/24/12 0345 01/23/12 0355 01/22/12 0408 01/21/12 0421  PHART 7.489* 7.496* 7.442 7.433  PCO2ART 33.8* 41.7 50.6* 50.0*  PO2ART 83.1 79.0* 94.4 82.1  HCO3 25.0* 31.6* 33.4* 32.4*  O2SAT 95.1 95.1 96.4 95.3   Ventilator Settings:    CXR:  10/9>>> Stable low lung volumes with atelectasis. ETT:  10/2 >>>10/10 self extubated  A:   1) Acute hypoxemic / hypercarbic respiratory failure secondary to acute pulmonary edema , ALI and  Atelectasis. 2) Suspected OSA/OHS. (self extubated on 10/10). Looks better from a pulm stand-point but abd issues are a concern. Would likely come back to ICU on vent if need re-exploration.  P:   Mobilize with PT/OT OOB to chair See ID section Swallow Eval on hold for OR needs?  CARDIOVASCULAR  Lab 01/21/12 0520  TROPONINI --  LATICACIDVEN --  PROBNP 82.8   Lines: R [place]er TLC 10/2 >>>10/10 Right PICC 10/11>>>  A: Hemodynamically stable.   P:  Cont tele Hold lasix today At risk ATN, especially if back to OR  RENAL  Lab 01/27/12 0420 01/26/12 1155 01/26/12 0545 01/24/12 0535 01/23/12 0420 01/22/12 0550 01/21/12 0520  NA 142 144 143 146* 150* -- --  K 3.8 4.0 -- -- -- -- --  CL 111 112 112 108 108 -- --  CO2 21 21 21 26  32 -- --  BUN 19 25* 28* 28* 27* -- --  CREATININE 0.64 0.75 0.77 0.87 0.95 -- --  CALCIUM 8.1* 8.0* 8.0* 8.1* 8.6 -- --  MG -- -- 2.6* -- 2.9* 2.8* 2.5  PHOS -- -- 2.5 -- 3.3 2.9 3.1  Intake/Output      10/10 0701 - 10/11 0700 10/11 0701 - 10/12 0700   I.V. (mL/kg) 180 (1.4)    Other     NG/GT 690    IV Piggyback 729 196   TPN     Total Intake(mL/kg) 1599 (12.4) 196 (1.5)   Urine (mL/kg/hr) 2530 (0.8) 175   Drains 160 50   Stool 250    Total Output 2940 225   Net -1341 -29          Intake/Output Summary (Last 24 hours) at 01/27/12 1019 Last data filed at 01/27/12  0900  Gross per 24 hour  Intake 1642.5 ml  Output   3055 ml  Net -1412.5 ml   Foley:  10/1 >>>  A:   1) Hypernatremia Improved with free water replacement P:   F/u chem in am  GASTROINTESTINAL  Lab 01/27/12 0420 01/26/12 1155 01/26/12 0545 01/24/12 0535 01/23/12 0420  AST 83* 198* 210* 47* 34  ALT 175* 230* 198* 32 20  ALKPHOS 74 79 70 60 64  BILITOT 0.5 0.6 0.6 0.8 0.5  PROT 6.4 6.8 6.4 6.4 6.5  ALBUMIN 1.9* 2.1* 2.0* 2.0* 2.0*   A:   Status post exploratory lap, partial colectomy, colostomy Perc drain for abdominal abscess - (see ID section)  CT abd 10/11>>> P:   Now NPO and adding back TNA due to concern about intraabd leak / ostomy malfunction Post op care per CCS, for CT  HEMATOLOGIC  Lab 01/27/12 0420 01/26/12 0545 01/24/12 0900 01/24/12 0535 01/23/12 0420 01/22/12 0550  HGB 11.5* 11.9* -- 12.5* 13.1 11.7*  HCT 36.1* 37.5* -- 38.9* 40.6 37.5*  PLT 398 328 -- 309 361 329  INR -- -- 1.19 -- -- --  APTT -- -- 32 -- -- --   A:   1) Hemoconcentration? Diuresed and now negative almost 3 liters. P:  Trend CBC Ambulation when able  INFECTIOUS  Lab 01/27/12 0420 01/26/12 0545 01/24/12 0535 01/23/12 0420 01/22/12 0550  WBC 8.2 9.8 11.8* 12.4* 8.3  PROCALCITON -- -- -- -- --   Cultures: Blood culture x2 10/3: negative Peritoneal fluid 10/8:abundant enterococcus (amp resist)  Antibiotics: Zosyn 9/30 >>> Micafungin 10/07>> Vancomycin 10/9>>>  A:   Peritonitis, diverticular abscess with persistent fevers. Left lower quadrant intra-abdominal abscess 10/7 status post cutaneous drain on 10/8 - fevers resolving and drainage has slowed, however surgical services are now concerned about intra-abd leak and ostomy malfunction.   P:   Antibiotics / cultures as above  Percutaneous drain management per surgery Follow up CT 10/11 Will not establish stop dates until CT reviewed and leak status?  ENDOCRINE  Lab 01/27/12 0825 01/27/12 0326 01/26/12 2359 01/26/12  2052 01/26/12 1549  GLUCAP 119* 140* 133* 112* 137*   A:  Hyperglycemia. Glycemic control P:   Goal CBG 120 to 180 SSI  NEUROLOGIC  A:  Acute encephalopathy secondary to sedation. - Now resolved  Post Op pain P:   Supportive care Pain management  BEST PRACTICE / DISPOSITION Level of Care:  ICU-->needs to stay in ICU. Have discussed case fully with surgical service  Primary Service:  CCS Consultants:  PCCM Code Status:  Full Diet:  TF DVT Px:  Protonix GI Px:  Lovenox Skin Integrity:  Intact Social / Family:  Updated family at bedside   Mcarthur Rossetti. Tyson Alias, MD, FACP Pgr: [phone] [facility]

## 2012-01-27 NOTE — Progress Notes (Signed)
ANTIBIOTIC CONSULT NOTE - FOLLOW UP  Pharmacy Consult for Vancomycin Indication: Sepsis  No Known Allergies  Patient Measurements: Height: 5\' 10"  (177.8 cm) Weight: 283 lb 4.7 oz (128.5 kg) IBW/kg (Calculated) : 73   Vital Signs: Temp: 98.7 F (37.1 C) (10/11 2000) Temp src: Oral (10/11 2000) BP: 122/76 mmHg (10/11 1900) Pulse Rate: 86  (10/11 1900) Intake/Output from previous day: 10/10 0701 - 10/11 0700 In: 1599 [I.V.:180; NG/GT:690; IV Piggyback:729] Out: 2940 [Urine:2530; Drains:160; Stool:250] Intake/Output from this shift:    Labs:  Basename 01/27/12 0420 01/26/12 1155 01/26/12 0545  WBC 8.2 -- 9.8  HGB 11.5* -- 11.9*  PLT 398 -- 328  LABCREA -- -- --  CREATININE 0.64 0.75 0.77   Estimated Creatinine Clearance: 165.3 ml/min (by C-G formula based on Cr of 0.64).  Basename 01/27/12 2120  VANCOTROUGH 6.8*  VANCOPEAK --  Drue Dun --  GENTTROUGH --  GENTPEAK --  GENTRANDOM --  TOBRATROUGH --  TOBRAPEAK --  TOBRARND --  AMIKACINPEAK --  AMIKACINTROU --  AMIKACIN --     Microbiology: Recent Results (from the past 720 hour(s))  URINE CULTURE     Status: Normal   Collection Time   01/06/12  8:42 PM      Component Value Range Status Comment   Specimen Description URINE, CLEAN CATCH   Final    Special Requests NONE   Final    Culture  Setup Time 01/07/2012 03:15   Final    Colony Count 30,000 COLONIES/ML   Final    Culture     Final    Value: Multiple bacterial morphotypes present, none predominant. Suggest appropriate recollection if clinically indicated.   Report Status 01/08/2012 FINAL   Final   SURGICAL PCR SCREEN     Status: Normal   Collection Time   01/17/12  8:28 AM      Component Value Range Status Comment   MRSA, PCR NEGATIVE  NEGATIVE Final    Staphylococcus aureus NEGATIVE  NEGATIVE Final   CULTURE, BLOOD (ROUTINE X 2)     Status: Normal   Collection Time   01/19/12 10:46 AM      Component Value Range Status Comment   Specimen  Description BLOOD LEFT ARM   Final    Special Requests BOTTLES DRAWN AEROBIC AND ANAEROBIC 10CC   Final    Culture  Setup Time 01/19/2012 16:55   Final    Culture NO GROWTH 5 DAYS   Final    Report Status 01/25/2012 FINAL   Final   CULTURE, BLOOD (ROUTINE X 2)     Status: Normal   Collection Time   01/19/12 10:48 AM      Component Value Range Status Comment   Specimen Description BLOOD LEFT ARM   Final    Special Requests BOTTLES DRAWN AEROBIC AND ANAEROBIC 10CC   Final    Culture  Setup Time 01/19/2012 16:55   Final    Culture NO GROWTH 5 DAYS   Final    Report Status 01/25/2012 FINAL   Final   CULTURE, ROUTINE-ABSCESS     Status: Normal   Collection Time   01/24/12  1:10 PM      Component Value Range Status Comment   Specimen Description PERITONEAL CAVITY   Final    Special Requests NONE   Final    Gram Stain     Final    Value: FEW WBC PRESENT, PREDOMINANTLY PMN     NO SQUAMOUS EPITHELIAL CELLS SEEN  ABUNDANT GRAM NEGATIVE RODS     ABUNDANT GRAM POSITIVE RODS     FEW GRAM VARIABLE ROD   Culture ABUNDANT ENTEROCOCCUS SPECIES   Final    Report Status 01/27/2012 FINAL   Final    Organism ID, Bacteria ENTEROCOCCUS SPECIES   Final   CULTURE, ROUTINE-ABSCESS     Status: Normal   Collection Time   01/24/12  2:07 PM      Component Value Range Status Comment   Specimen Description PERITONEAL CAVITY   Final    Special Requests NONE   Final    Gram Stain     Final    Value: RARE WBC PRESENT, PREDOMINANTLY PMN     NO SQUAMOUS EPITHELIAL CELLS SEEN     ABUNDANT GRAM POSITIVE RODS     MODERATE GRAM NEGATIVE RODS     MODERATE GRAM VARIABLE ROD   Culture ABUNDANT ENTEROCOCCUS SPECIES   Final    Report Status 01/27/2012 FINAL   Final    Organism ID, Bacteria ENTEROCOCCUS SPECIES   Final     Anti-infectives     Start     Dose/Rate Route Frequency Ordered Stop   01/25/12 1045   vancomycin (VANCOCIN) 1,250 mg in sodium chloride 0.9 % 250 mL IVPB        1,250 mg 166.7 mL/hr over 90  Minutes Intravenous 2 times daily 01/25/12 1020     01/23/12 1300   micafungin (MYCAMINE) 100 mg in sodium chloride 0.9 % 100 mL IVPB        100 mg 100 mL/hr over 1 Hours Intravenous Daily 01/23/12 1158     01/16/12 1800  piperacillin-tazobactam (ZOSYN) IVPB 3.375 g       3.375 g 12.5 mL/hr over 240 Minutes Intravenous 3 times per day 01/16/12 1721 01/27/12 1012          Assessment:  41 YOM with intra-abdominal abscess s/p perc drain, wound VAC (dc today)  Day #3 Vancomycin  Day #12 Zosyn and #5 Micafungin per MD  Vanc trough subtherapeutic, renal function is stable  Goal of Therapy:  Vancomycin trough level 15-20 mcg/ml  Plan:   Increase vancomycin to 2gm IV q12h  Recheck trough at new steady state  Loralee Pacas, PharmD, BCPS 01/27/2012,10:28 PM

## 2012-01-27 NOTE — Progress Notes (Addendum)
Patient ID: [name], male   DOB: [birth year], 41 y.o.   MRN: [id number] 10 Days Post-Op  Subjective: Pt having a lot of pain because of [place] removing his VAC.  Otherwise no new complaints.  Objective: Vital signs in last 24 hours: Temp:  [98.5 F (36.9 C)-101.8 F (38.8 C)] 98.9 F (37.2 C) (10/11 0800) Pulse Rate:  [78-115] 89  (10/11 1000) Resp:  [30-43] 31  (10/11 1000) BP: (113-139)/(69-94) 139/94 mmHg (10/11 1000) SpO2:  [94 %-98 %] 97 % (10/11 1000) Last BM Date: 01/26/12  Intake/Output from previous day: 10/10 0701 - 10/11 0700 In: 1599 [I.V.:180; NG/GT:690; IV Piggyback:729] Out: 2940 [Urine:2530; Drains:160; Stool:250] Intake/Output this shift: Total I/O In: 196 [IV Piggyback:196] Out: 225 [Urine:175; Drains:50]  PE: Abd: soft, wound initially with granulation tissue covering necrotic fascia.  This tissue was opened back up to expose necrotic tissue so this can be packed.  His colostomy was removed.  There is some purulent drainage noted just later to the "stoma".  A Q-tip is placed in this and it tracts several centimeters.  His stoma is completely retracted.  A small opening is found, but minimal output in ostomy has been noted over the last 24 hours.  Drain with minimal purulent appearing output.  Lab Results:   Basename 01/27/12 0420 01/26/12 0545  WBC 8.2 9.8  HGB 11.5* 11.9*  HCT 36.1* 37.5*  PLT 398 328   BMET  Basename 01/27/12 0420 01/26/12 1155  NA 142 144  K 3.8 4.0  CL 111 112  CO2 21 21  GLUCOSE 126* 123*  BUN 19 25*  CREATININE 0.64 0.75  CALCIUM 8.1* 8.0*   PT/INR No results found for this basename: LABPROT:2,INR:2 in the last 72 hours CMP     Component Value Date/Time   NA 142 01/27/2012 0420   K 3.8 01/27/2012 0420   CL 111 01/27/2012 0420   CO2 21 01/27/2012 0420   GLUCOSE 126* 01/27/2012 0420   BUN 19 01/27/2012 0420   CREATININE 0.64 01/27/2012 0420   CALCIUM 8.1* 01/27/2012 0420   PROT 6.4 01/27/2012 0420   ALBUMIN 1.9*  01/27/2012 0420   AST 83* 01/27/2012 0420   ALT 175* 01/27/2012 0420   ALKPHOS 74 01/27/2012 0420   BILITOT 0.5 01/27/2012 0420   GFRNONAA >90 01/27/2012 0420   GFRAA >90 01/27/2012 0420   Lipase     Component Value Date/Time   LIPASE 24 01/16/2012 1210       Studies/Results: Dg Chest Port 1 View  01/26/2012  *RADIOLOGY REPORT*  Clinical Data: Confirm PICC line placement  PORTABLE CHEST - 1 VIEW  Comparison: 01/25/2012; 01/24/2012;  Findings:  Interval placement of right upper extremity approach PICC line with tip overlying the superior cavoatrial junction.  Interval extubation and removal of enteric tube.  Left-sided central venous line tip remains over the superior cavoatrial junction.  Lung volumes remain persistently reduced.  Grossly unchanged perihilar and basilar heterogeneous opacities.  The pulmonary vasculature remains indistinct.  No definite pleural effusion.  No pneumothorax.  Unchanged bones.  IMPRESSION: 1. Right extremity approach PICC line tip terminates over the superior cavoatrial junction. 2.  Interval extubation and removal of enteric tube.  Stable positioning of left upper extremity approach central venous catheter.  No pneumothorax. 3.  Persistently reduced lung volumes with perihilar and bibasilar opacities, likely atelectasis. 4.  Mild pulmonary edema suspected.   Original Report Authenticated By: Waynard Reeds, M.D.     Anti-infectives: Anti-infectives  Start     Dose/Rate Route Frequency Ordered Stop   01/25/12 1045   vancomycin (VANCOCIN) 1,250 mg in sodium chloride 0.9 % 250 mL IVPB        1,250 mg 166.7 mL/hr over 90 Minutes Intravenous 2 times daily 01/25/12 1020     01/23/12 1300   micafungin (MYCAMINE) 100 mg in sodium chloride 0.9 % 100 mL IVPB        100 mg 100 mL/hr over 1 Hours Intravenous Daily 01/23/12 1158     01/16/12 1800  piperacillin-tazobactam (ZOSYN) IVPB 3.375 g       3.375 g 12.5 mL/hr over 240 Minutes Intravenous 3 times per  day 01/16/12 1721 01/27/12 1012           Assessment/Plan  1. S/p ex lap with Hartman's for perf tics  T. Cornett - 01/17/2012  Open wound with VAC, but wound has been poorly served with the VAC.  Change to damp to dry dressings. 2. Retraction of stoma 3. Intra-abd abscess, s/p perc drain 4. VDRF, resolved 5. Cx- enterococcus 6. PCM/TNA 7. DVT proph - Lovenox  Plan: 1. Will repeat a CT scan today to better evaluate his stoma.  We will hopefully be able to see if this has retracted or pulled back into his abdomen.  If it has, he will need exploration for revision.  If not, we would like to see if this will get better without repeat surgery.    Discussed with [name] what we are looking for. 2. DC VAC!  Start NS WD dressing changes TID to abdominal wound 3. Cont drain 4. Cont abx therapy 5. Will make patient NPO for now, given his abdominal situation.  We will have pharmacy start the patient on TNA for nutritional support.    LOS: 11 days    OSBORNE,KELLY E 01/27/2012, 10:24 AM Pager: [phone]  Retracted stoma and poor wound healing.  CT scan pending.  Discussed findings and concerns the patient and  his father.  Addendum: CT scan showed about 1 cm retraction of colostomy.  No intra-abdominal contrast.  The left abdominal abscess is somewhat better.  Discussed with patient,  Father, and wife.  Ovidio Kin, MD, [facility] Pager: [phone] Office phone:  [phone]

## 2012-01-27 NOTE — Progress Notes (Addendum)
Subjective: Pt ok. Remains extubated, NGT out now too. On clears diet  Objective: Physical Exam: BP 134/79  Pulse 86  Temp 99 F (37.2 C) (Oral)  Resp 30  Ht 5\' 10"  (1.778 m)  Wt 283 lb 4.7 oz (128.5 kg)  BMI 40.65 kg/m2  SpO2 97% LUQ drain intact, site clean Output still beige milky   Labs: CBC  Basename 01/27/12 0420 01/26/12 0545  WBC 8.2 9.8  HGB 11.5* 11.9*  HCT 36.1* 37.5*  PLT 398 328   BMET  Basename 01/27/12 0420 01/26/12 1155  NA 142 144  K 3.8 4.0  CL 111 112  CO2 21 21  GLUCOSE 126* 123*  BUN 19 25*  CREATININE 0.64 0.75  CALCIUM 8.1* 8.0*   LFT  Basename 01/27/12 0420  PROT 6.4  ALBUMIN 1.9*  AST 83*  ALT 175*  ALKPHOS 74  BILITOT 0.5  BILIDIR --  IBILI --  LIPASE --   PT/INR  Basename 01/24/12 0900  LABPROT 14.9  INR 1.19     Studies/Results: Dg Chest Port 1 View  01/26/2012  *RADIOLOGY REPORT*  Clinical Data: Confirm PICC line placement  PORTABLE CHEST - 1 VIEW  Comparison: 01/25/2012; 01/24/2012;  Findings:  Interval placement of right upper extremity approach PICC line with tip overlying the superior cavoatrial junction.  Interval extubation and removal of enteric tube.  Left-sided central venous line tip remains over the superior cavoatrial junction.  Lung volumes remain persistently reduced.  Grossly unchanged perihilar and basilar heterogeneous opacities.  The pulmonary vasculature remains indistinct.  No definite pleural effusion.  No pneumothorax.  Unchanged bones.  IMPRESSION: 1. Right extremity approach PICC line tip terminates over the superior cavoatrial junction. 2.  Interval extubation and removal of enteric tube.  Stable positioning of left upper extremity approach central venous catheter.  No pneumothorax. 3.  Persistently reduced lung volumes with perihilar and bibasilar opacities, likely atelectasis. 4.  Mild pulmonary edema suspected.   Original Report Authenticated By: Waynard Reeds, M.D.     Assessment/Plan: S/p  (L)sided peritoneal abscess drainage Cont to follow Repeat CT as output trends down or early next week.    LOS: 11 days    Brayton El PA-C 01/27/2012 8:20 AM

## 2012-01-27 NOTE — Progress Notes (Signed)
[id number] Earlene Plater, RN, BSN, CCM: CHART REVIEWED AND UPDATED. NO DISCHARGE NEEDS PRESENT AT THIS TIME. CASE MANAGEMENT [phone]

## 2012-01-27 NOTE — Progress Notes (Signed)
Speech Language Pathology Dysphagia Treatment Patient Details Name: [name] MRN: [id number] DOB: 1971/03/24 Today's Date: 01/27/2012 Time: 4098-1191 SLP Time Calculation (min): 13 min  Assessment / Plan / Recommendation Clinical Impression  F/u after yesterday's swallow eval per Dr. Tyson Alias.  Pt with improved phonatory function; RR remains low 30s.  Provided with minimal sips of water given possibility of return to OR - pt protecting airway adequately with no clinical symptoms of aspiration and no other comorbidities that would suggest dysphagia.  Recommend advancing diet per MD when medically ready.  No further SLP needs identified.  Will sign-off.     Diet Recommendation    clears; advance when medically appropriate   SLP Plan All goals met;Discharge SLP treatment due to (comment)      Swallowing Goals   n/a  General Temperature Spikes Noted: No Behavior/Cognition: Alert;Cooperative Oral Cavity - Dentition: Edentulous Patient Positioning: Partially reclined  Treatment focused on: Skilled observation of diet tolerance Treatment Methods/Modalities: Skilled observation;Differential diagnosis Patient observed directly with PO's: Yes Type of PO's observed: Thin liquids Feeding: Needs assist Liquids provided via: Straw      Coda Filler [name], [place] CCC/SLP Pager [phone]   [name] 01/27/2012, 11:26 AM

## 2012-01-27 NOTE — Clinical Social Work Note (Signed)
CSW following for support and possible d/c needs. CSW spoke to Artist. Pt will need to apply for assistance in his home county. CSW explained this to Pt's father Earvin Hansen and explained the application process. Pt will have PT/OT needs per PT at d/c. CSW will follow as this may be tricky. CIR would be good plan due to very involved family support and age of Pt. CSW will check in on Monday.  Doreen Salvage, LCSWA ICU/Stepdown Clinical Social Worker [facility] Cell [phone] Hours 8am-1200pm M-F

## 2012-01-27 NOTE — Consult Note (Signed)
WOC follow-up consult Note Removed vac dressing with Barnetta Chapel, CCS PA, at bedside to assess abd wound and stoma site.  Abd wound has declined with deeper tunneling areas and darker wound bed than previous assessment, according to PA.  Areas very painful when probed with swab.  Mod pink-tan drainage in cannister, no odor.    Colostomy site without visible stoma; has slipped below skin level and out of view. Sutures remain intact surrounding site.  Upon removal of ostomy pouch, the "crater" of the previous stoma site was filled with thick tan pus.  Very painful to touch when swab inserted to depth of 4 cm.  Slough noted to middle of crater.  Pt has not had any stool or flatus  today. Pus appearance is same color and consistency as the contents of the abd drain located near the previous stoma site.    PA notified Dr Ezzard Standing, who assessed both sites.  Vac was D/Ced and moist gauze dressings were ordered by CCS.  Reapplied one piece convex pouch and barrier ring over previous stoma site.  CT scan pending.  Supplies ordered to bedside for staff use.   Cammie Mcgee, RN, MSN, [employer]  [phone]

## 2012-01-27 NOTE — Progress Notes (Signed)
Physical Therapy Treatment Patient Details Name: [name] MRN: [id number] DOB: 09-18-1970 Today's Date: 01/27/2012 Time: 4098-1191 PT Time Calculation (min): 17 min  PT Assessment / Plan / Recommendation Comments on Treatment Session  pt appears to be improved today with more interaction and active movement.  He continues to have some lag in motor response and has decreased endurance (limited to 5 repetitions os exercise) but he has improved strength.  Feel he would be a good candidate for CIR consideration when medically ready for rehab    Follow Up Recommendations  Post acute inpatient     Does the patient have the potential to tolerate intense rehabilitation     Barriers to Discharge        Equipment Recommendations  Rolling walker with 5" wheels;3 in 1 bedside comode    Recommendations for Other Services    Frequency Min 3X/week   Plan Discharge plan needs to be updated    Precautions / Restrictions     Pertinent Vitals/Pain Pt c/o minor back pain    Mobility  Bed Mobility Bed Mobility: Rolling Right Rolling Right: 1: +2 Total assist Rolling Right: Patient Percentage: 20% Details for Bed Mobility Assistance: needed assist to hold leg to push while rolling and direct UE to reach for rail when rolling toward right.  Pt better able to push with hip and pull shoulder forward to maintain roll.  Transfers Transfers: Not assessed Details for Transfer Assistance: recommend use of maxisky for transfers with nursing Ambulation/Gait Ambulation/Gait Assistance: Not tested (comment) Wheelchair Mobility Wheelchair Mobility: No    Exercises General Exercises - Upper Extremity Shoulder Flexion: AAROM;Both;5 reps;Supine Elbow Flexion: AAROM;Both;5 reps;Supine Elbow Extension: AAROM;Right;Supine;Both Wrist Flexion: AAROM;Both;5 reps;Supine General Exercises - Lower Extremity Ankle Circles/Pumps: AAROM;Both;5 reps;Supine Quad Sets: AROM;Both;5 reps;Supine Short Arc Quad:  AROM;Both;5 reps;Supine (over pillow) Hip Flexion/Marching: AAROM;Both;5 reps;Supine   PT Diagnosis:    PT Problem List:   PT Treatment Interventions:     PT Goals Acute Rehab PT Goals PT Goal Formulation: Patient unable to participate in goal setting Time For Goal Achievement: 02/09/12 Potential to Achieve Goals: Good Pt will Roll Supine to Right Side: with min assist;with rail PT Goal: Rolling Supine to Right Side - Progress: Progressing toward goal  Visit Information  Last PT Received On: 01/27/12 Assistance Needed: +2    Subjective Data  Subjective: pt more verbal today, joking with staff Patient Stated Goal: none stated   Cognition  Overall Cognitive Status: Appears within functional limits for tasks assessed/performed Area of Impairment: Attention;Memory;Following commands;Safety/judgement;Awareness of deficits Arousal/Alertness: Awake/alert Orientation Level: Disoriented to;Place;Time;Situation Behavior During Session: Restless Current Attention Level: Alternating Following Commands: Follows one step commands inconsistently;Follows one step commands with increased time Safety/Judgement: Decreased awareness of safety precautions;Impulsive;Decreased awareness of need for assistance    Balance     End of Session PT - End of Session Activity Tolerance: Patient limited by fatigue   GP     Donnetta Hail 01/27/2012, 10:51 AM

## 2012-01-27 NOTE — Progress Notes (Addendum)
PARENTERAL NUTRITION CONSULT NOTE - INITIAL  Pharmacy Consult for TNA Indication: possible bowel leak  No Known Allergies  Patient Measurements: Height: 5\' 10"  (177.8 cm) Weight: 283 lb 4.7 oz (128.5 kg) IBW/kg (Calculated) : 73  Adjusted Body Weight: 95kg  Vital Signs: Temp: 98.8 F (37.1 C) (10/11 1200) Temp src: Oral (10/11 1200) BP: 126/83 mmHg (10/11 1300) Pulse Rate: 79  (10/11 1300) Intake/Output from previous day: 10/10 0701 - 10/11 0700 In: 1599 [I.V.:180; NG/GT:690; IV Piggyback:729] Out: 2940 [Urine:2530; Drains:160; Stool:250] Intake/Output from this shift: Total I/O In: 959 [P.O.:540; I.V.:40; IV Piggyback:379] Out: 1325 [Urine:1275; Drains:50]  Labs:  [facility] 01/27/12 0420 01/26/12 0545  WBC 8.2 9.8  HGB 11.5* 11.9*  HCT 36.1* 37.5*  PLT 398 328  APTT -- --  INR -- --     Basename 01/27/12 0420 01/26/12 1155 01/26/12 0545  NA 142 144 143  K 3.8 4.0 3.8  CL 111 112 112  CO2 21 21 21   GLUCOSE 126* 123* 106*  BUN 19 25* 28*  CREATININE 0.64 0.75 0.77  LABCREA -- -- --  CREAT24HRUR -- -- --  CALCIUM 8.1* 8.0* 8.0*  MG -- -- 2.6*  PHOS -- -- 2.5  PROT 6.4 6.8 6.4  ALBUMIN 1.9* 2.1* 2.0*  AST 83* 198* 210*  ALT 175* 230* 198*  ALKPHOS 74 79 70  BILITOT 0.5 0.6 0.6  BILIDIR -- -- --  IBILI -- -- --  PREALBUMIN -- -- --  TRIG -- -- --  CHOLHDL -- -- --  CHOL -- -- --   Estimated Creatinine Clearance: 165.3 ml/min (by C-G formula based on Cr of 0.64).    Medical History: Past Medical History  Diagnosis Date  . Urinary anastomotic stricture undiagnosed     hard to be cathed; urology did the last time  . Diverticulitis   . Colon obstruction 01/17/2012  . Obesity, Class III, BMI 40-49.9 (morbid obesity) 01/17/2012    Medications:  Scheduled:    . antiseptic oral rinse  15 mL Mouth Rinse QID  . chlorhexidine  15 mL Mouth Rinse BID  . enoxaparin  60 mg Subcutaneous Q24H  . insulin aspart  0-20 Units Subcutaneous Q4H  . insulin  glargine  10 Units Subcutaneous QHS  . metoprolol  5 mg Intravenous Q6H  . micafungin (MYCAMINE) IV  100 mg Intravenous Daily  . pantoprazole (PROTONIX) IV  40 mg Intravenous QHS  . piperacillin-tazobactam (ZOSYN)  IV  3.375 g Intravenous Q8H  . sodium chloride  10-40 mL Intracatheter Q12H  . vancomycin  1,250 mg Intravenous BID  . DISCONTD: feeding supplement  30 mL Per Tube QAC lunch  . DISCONTD: free water  200 mL Per Tube Q4H  . DISCONTD: furosemide  40 mg Intravenous Daily  . DISCONTD: potassium chloride  30 mEq Per Tube Q8H  . DISCONTD: potassium chloride  40 mEq Per Tube Daily   Infusions:    . dextrose 500 mL (01/26/12 2026)  . fat emulsion    . TPN (CLINIMIX) +/- additives    . DISCONTD: feeding supplement (VITAL AF 1.2 CAL) 1,000 mL (01/26/12 0520)  . DISCONTD: TPN (CLINIMIX) +/- additives     PRN: acetaminophen (TYLENOL) oral liquid 160 mg/5 mL, albuterol-ipratropium, alum & mag hydroxide-simeth, fentaNYL, hydrALAZINE, magic mouthwash, ondansetron, sodium chloride   CBG and Insulin Requirements in the past 24 hours:  CBG 112-140 Lantus 10 units sq qhs SSI q4h, resistant scale, used 14 units  Nutritional Goals:  Re-estimated needs per  RD 10/9: 2428 kcal, 137-161 grams protein, 1456-1700 kcal (60-70% of estimated energy needs Clinimix E 5/20% @ 169ml/hr + Lipids 20% @ 80ml/hr on MWF only = avg 2318 kcal/ day and 120 g protein daily  Current Nutrition:  NPO  Assessment: 41 yom presented 9/30 with abd pain x 1 month, N/V. CT w/ stricture of descending colon with colonic obstruction. 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 Complicated by hypoxemic respiratory failure requiring intubation 10/2. TNA started 10/4. Per RD note, pt reports poor PO intake prior to admission, nausea, dry heaves, and lack of appetite. Pt reports 29 lb unintentional weight loss in the past month. TF started 10/7, and was titrated up. TNA was d/c 10/9. Now suspected  bowel leak, possible need for re-exploration. TF d/c and TNA to resume.  Labs  CBG: within range on current insulin regimen Electrolytes: wnl Renal function: Scr wnl/stable  Hepatic function: Elevated, trending down  Pre-Albumin: 5.5 (10/7)  TG/Cholesterol: TG elevated 10/5 (422), 10/7 down to 341  Plan:   Clinimix E 5/20% to start tonight @ 1800 @ rate of 32ml/hr, advance to goal rate as tolerated  TNA to contain IV fat emulsion, standard multivitamins and trace elements only on MWF only due to ongoing shortage   Continue SSI to resistant scale, continue Lantus as ordered by MD.   Routine TNA labs Monday/Thursdays  Pharmacy will follow up daily  Gwen Her PharmD  [phone] 01/27/2012 1:22 PM

## 2012-01-27 NOTE — Progress Notes (Signed)
Nutrition Follow-up/Consult for new TPN  Intervention:  Monitor TPN with pharmacy.  Clinimix E 5/20 at goal of 110 ml/hr plus 20% lipids at 10 ml/hr MWF would meet estimated needs and provde:  2529 kcal, 132 gm protein.  Assessment:   Concerns for intra abdominal leak/ostomy malfunction with possible need for re-exploration.  Wound VAC d/c'ed and followed by wound care nurse.  Seen by SLP.  OK for diet advancement when medically appropriate.  TPN to be resumed with Clinimix E 5/20 at 40 ml/hr. Plus 20% lipids at 10 ml/hr MWF.  Diet Order:  NPO due to concerns of intra abdominal leak TF d/ced after self extubation.  Meds: Scheduled Meds:   . antiseptic oral rinse  15 mL Mouth Rinse QID  . chlorhexidine  15 mL Mouth Rinse BID  . enoxaparin  60 mg Subcutaneous Q24H  . insulin aspart  0-20 Units Subcutaneous Q4H  . insulin glargine  10 Units Subcutaneous QHS  . metoprolol  5 mg Intravenous Q6H  . micafungin (MYCAMINE) IV  100 mg Intravenous Daily  . pantoprazole (PROTONIX) IV  40 mg Intravenous QHS  . piperacillin-tazobactam (ZOSYN)  IV  3.375 g Intravenous Q8H  . sodium chloride  10-40 mL Intracatheter Q12H  . vancomycin  1,250 mg Intravenous BID  . DISCONTD: feeding supplement  30 mL Per Tube QAC lunch  . DISCONTD: free water  200 mL Per Tube Q4H  . DISCONTD: furosemide  40 mg Intravenous Daily  . DISCONTD: potassium chloride  30 mEq Per Tube Q8H  . DISCONTD: potassium chloride  40 mEq Per Tube Daily   Continuous Infusions:   . dextrose 500 mL (01/26/12 2026)  . fat emulsion    . TPN (CLINIMIX) +/- additives    . DISCONTD: feeding supplement (VITAL AF 1.2 CAL) 1,000 mL (01/26/12 0520)  . DISCONTD: TPN (CLINIMIX) +/- additives     PRN Meds:.acetaminophen (TYLENOL) oral liquid 160 mg/5 mL, albuterol-ipratropium, alum & mag hydroxide-simeth, fentaNYL, hydrALAZINE, magic mouthwash, ondansetron, sodium chloride  Labs:  CMP     Component Value Date/Time   NA 142 01/27/2012 0420   K 3.8 01/27/2012 0420   CL 111 01/27/2012 0420   CO2 21 01/27/2012 0420   GLUCOSE 126* 01/27/2012 0420   BUN 19 01/27/2012 0420   CREATININE 0.64 01/27/2012 0420   CALCIUM 8.1* 01/27/2012 0420   PROT 6.4 01/27/2012 0420   ALBUMIN 1.9* 01/27/2012 0420   AST 83* 01/27/2012 0420   ALT 175* 01/27/2012 0420   ALKPHOS 74 01/27/2012 0420   BILITOT 0.5 01/27/2012 0420   GFRNONAA >90 01/27/2012 0420   GFRAA >90 01/27/2012 0420     Intake/Output Summary (Last 24 hours) at 01/27/12 1427 Last data filed at 01/27/12 1400  Gross per 24 hour  Intake 1536.5 ml  Output   2725 ml  Net -1188.5 ml    Weight Status:  128.5 kg (Range of 124.5 kd-137.4 kg)   Re-estimated needs:  2300-2500 kcal, 130-160 gm protein  Nutrition Dx:  Inadequate oral intake-ongoing.  Now related to altered gi function AEB concerns of leaking abd.  Goal:  Meet >95% estimated needs with TPN plus lipids  Monitor:  TPN per pharmacy, diet advancement per MD, labs, intake, status   Oran Rein, RD, LDN Clinical Inpatient Dietitian Pager:  [phone] Weekend and after hours pager:  [phone]

## 2012-01-28 LAB — GLUCOSE, CAPILLARY
Glucose-Capillary: 147 mg/dL — ABNORMAL HIGH (ref 70–99)
Glucose-Capillary: 123 mg/dL — ABNORMAL HIGH (ref 70–99)
Glucose-Capillary: 151 mg/dL — ABNORMAL HIGH (ref 70–99)

## 2012-01-28 LAB — CBC
Hemoglobin: 11.3 g/dL — ABNORMAL LOW (ref 13.0–17.0)
MCH: 30.2 pg (ref 26.0–34.0)
MCV: 93.9 fL (ref 78.0–100.0)
RBC: 3.74 MIL/uL — ABNORMAL LOW (ref 4.22–5.81)

## 2012-01-28 LAB — BASIC METABOLIC PANEL
CO2: 24 mEq/L (ref 19–32)
Calcium: 8.1 mg/dL — ABNORMAL LOW (ref 8.4–10.5)
Creatinine, Ser: 0.52 mg/dL (ref 0.50–1.35)
Glucose, Bld: 157 mg/dL — ABNORMAL HIGH (ref 70–99)
Sodium: 139 mEq/L (ref 135–145)

## 2012-01-28 MED ORDER — CLINIMIX E/DEXTROSE (5/20) 5 % IV SOLN
INTRAVENOUS | Status: AC
  Administered 2012-01-28: 17:00:00 via INTRAVENOUS

## 2012-01-28 MED ORDER — BIOTENE DRY MOUTH MT LIQD
15.0000 mL | Freq: Two times a day (BID) | OROMUCOSAL | Status: DC
  Administered 2012-01-28 – 2012-02-01 (×9): 15 mL via OROMUCOSAL

## 2012-01-28 NOTE — Progress Notes (Signed)
Name: [name] MRN: [id number] DOB: 1970/09/02    LOS: 12  Referring Provider:  CCS Reason for Referral:  Acute respiratory failure  PULMONARY / CRITICAL CARE MEDICINE  Brief patient description:  41 yo morbidly obese male with suspected OSA/OHS admitted 9/30 with perf bowel due to stricture, + peritonitis, s/p exploratory laparotomy with partial colectomy and colostomy (10/1).  The post op course has been complicated by hypoxemic respiratory failure requiring intubation.  Events Since Admission: 9/30  Admitted with abdominal pain, nausea and vomiting 10/1  Exploratory laparotomy, partial colectomy, colostomy; peritoneal abscess noted 10/2  Intubated for hypoxemic respiratory failure 10/7: persistent fevers, CT of abdomen and pelvis demonstrated large left-sided fluid collection, and the left abdomen. 10/8: Percutaneous drain placed in left lower quadrant fluid collection by interventional radiology 10/10: Self extubated over night, but tolerating [place] O2 10/11- CT improved  Vital Signs: Temp:  [97.7 F (36.5 C)-99.1 F (37.3 C)] 98.3 F (36.8 C) (10/12 0800) Pulse Rate:  [74-91] 81  (10/12 0600) Resp:  [22-33] 24  (10/12 0600) BP: (122-144)/(76-94) 137/77 mmHg (10/12 0600) SpO2:  [96 %-99 %] 99 % (10/12 0600) Weight:  [123.9 kg (273 lb 2.4 oz)] 123.9 kg (273 lb 2.4 oz) (10/12 0400) 2 liters  Physical Examination:  Gen: Obese, in bed HEENT:  PERRL PULM: decreased bases, scattered coarse improved with cough CV: RRR, no mgr AB: BS+, wound well granulated colostomy in place, ostomy deep Ext: warm, scds, trace edema Neuro: Alert, oriented x 3, follows commands,   Principal Problem:  *Colon obstruction Active Problems:  UTI (lower urinary tract infection)  Diverticulitis of colon without hemorrhage  Obesity, Class III, BMI 40-49.9 (morbid obesity)  Acute respiratory failure with hypoxia  Acute pulmonary edema  Encephalopathy acute  Tobacco abuse  Intra-abdominal  abscess, diverticular s/p surgical drainage  Hypertriglyceridemia  ASSESSMENT AND PLAN  PULMONARY  Lab 01/24/12 0345 01/23/12 0355 01/22/12 0408  PHART 7.489* 7.496* 7.442  PCO2ART 33.8* 41.7 50.6*  PO2ART 83.1 79.0* 94.4  HCO3 25.0* 31.6* 33.4*  O2SAT 95.1 95.1 96.4   Ventilator Settings:    CXR:  10/9>>> Stable low lung volumes with atelectasis. ETT:  10/2 >>>10/10 self extubated  A:   1) Acute hypoxemic / hypercarbic respiratory failure secondary to acute pulmonary edema , ALI and  Atelectasis. 2) Suspected OSA/OHS. (self extubated on 10/10).  P:   Mobilize with PT/OT, to chair Swallow Eval done IS pcxr in am for atx assessment  CARDIOVASCULAR No results found for this basename: TROPONINI:5,LATICACIDVEN:5, O2SATVEN:5,PROBNP:5 in the last 168 hours Lines: R [place] TLC 10/2 >>>10/10 Right PICC 10/11>>>  A: Hemodynamically stable.   P:  Cont tele  RENAL  Lab 01/28/12 0325 01/27/12 0420 01/26/12 1155 01/26/12 0545 01/24/12 0535 01/23/12 0420 01/22/12 0550  NA 139 142 144 143 146* -- --  K 3.5 3.8 -- -- -- -- --  CL 107 111 112 112 108 -- --  CO2 24 21 21 21 26  -- --  BUN 15 19 25* 28* 28* -- --  CREATININE 0.52 0.64 0.75 0.77 0.87 -- --  CALCIUM 8.1* 8.1* 8.0* 8.0* 8.1* -- --  MG -- -- -- 2.6* -- 2.9* 2.8*  PHOS -- -- -- 2.5 -- 3.3 2.9   Intake/Output      10/11 0701 - 10/12 0700 10/12 0701 - 10/13 0700   P.O. 540    I.V. (mL/kg) 340 (2.7)    Other 15    NG/GT     IV Piggyback  889    TPN 700    Total Intake(mL/kg) 2484 (20)    Urine (mL/kg/hr) 2225 (0.7)    Drains 65    Stool 300    Total Output 2590    Net -106           Intake/Output Summary (Last 24 hours) at 01/28/12 0822 Last data filed at 01/28/12 0600  Gross per 24 hour  Intake 2471.5 ml  Output   2365 ml  Net  106.5 ml   Foley:  10/1 >>>  A:   1) Hypernatremia-resolved Dc any free water P:   F/u chem in am May need lasix next 24 hrs to even balance goals  GASTROINTESTINAL  Lab  01/27/12 0420 01/26/12 1155 01/26/12 0545 01/24/12 0535 01/23/12 0420  AST 83* 198* 210* 47* 34  ALT 175* 230* 198* 32 20  ALKPHOS 74 79 70 60 64  BILITOT 0.5 0.6 0.6 0.8 0.5  PROT 6.4 6.8 6.4 6.4 6.5  ALBUMIN 1.9* 2.1* 2.0* 2.0* 2.0*   A:   Status post exploratory lap, partial colectomy, colostomy Perc drain for abdominal abscess - (see ID section)  CT abd 10/11>>> P:   Clears started per surgery Post op care per CCS CT reviewed, improved abscess  HEMATOLOGIC  Lab 01/28/12 0325 01/27/12 0420 01/26/12 0545 01/24/12 0900 01/24/12 0535 01/23/12 0420  HGB 11.3* 11.5* 11.9* -- 12.5* 13.1  HCT 35.1* 36.1* 37.5* -- 38.9* 40.6  PLT 467* 398 328 -- 309 361  INR -- -- -- 1.19 -- --  APTT -- -- -- 32 -- --   A:   1) Hemoconcentration P:  lov  INFECTIOUS  Lab 01/28/12 0325 01/27/12 0420 01/26/12 0545 01/24/12 0535 01/23/12 0420  WBC 7.5 8.2 9.8 11.8* 12.4*  PROCALCITON -- -- -- -- --   Cultures: Blood culture x2 10/3: negative Peritoneal fluid 10/8:abundant enterococcus (amp resist)  Antibiotics: Zosyn 9/30 >>>plan stop 10/12 Micafungin 10/07>>stop planned 10/14 Vancomycin 10/9>>>  A:   Peritonitis, diverticular abscess with persistent fevers. Left lower quadrant intra-abdominal abscess 10/7 status post cutaneous drain on 10/8 - Ct and clinically improved  P:   Continue vanc, will need 14 days min vanc Add stop date mycofungin Percutaneous drain management per surgery Dc zosyn  ENDOCRINE  Lab 01/28/12 0743 01/28/12 0323 01/27/12 2333 01/27/12 1924 01/27/12 1535  GLUCAP 147* 154* 141* 128* 103*   A:  Hyperglycemia. Glycemic control P:   Goal CBG 120 to 180 SSI lantus , no changes  NEUROLOGIC  A:  Acute encephalopathy secondary to sedation. - Now resolved  Post Op pain P:   Supportive care Pain management IS Mobilize PT  BEST PRACTICE / DISPOSITION Level of Care:  To sdu bed Primary Service:  CCS Consultants:  PCCM Code Status:  Full Diet:   TF DVT Px:  Protonix GI Px:  Lovenox Skin Integrity:  Intact Social / Family:  Updated family at bedside   Mcarthur Rossetti. Tyson Alias, MD, FACP Pgr: [phone] [facility]

## 2012-01-28 NOTE — Progress Notes (Signed)
11 Days Post-Op  Subjective: Swallowing okay.  He is aware of the CT results.  Objective: Vital signs in last 24 hours: Temp:  [97.7 F (36.5 C)-99.1 F (37.3 C)] 97.7 F (36.5 C) (10/12 0400) Pulse Rate:  [74-91] 81  (10/12 0600) Resp:  [22-33] 24  (10/12 0600) BP: (122-144)/(76-94) 137/77 mmHg (10/12 0600) SpO2:  [96 %-99 %] 99 % (10/12 0600) Weight:  [273 lb 2.4 oz (123.9 kg)] 273 lb 2.4 oz (123.9 kg) (10/12 0400) Last BM Date: 01/27/12  Intake/Output from previous day: 10/11 0701 - 10/12 0700 In: 2484 [P.O.:540; I.V.:340; IV Piggyback:889; TPN:700] Out: 2590 [Urine:2225; Drains:65; Stool:300] Intake/Output this shift:    PE: Abd-soft, obese, colostomy retracted but draining brown liquid stool, LLQ drain output cloudy, open midline wound with areas of fascia dehiscence (stuck) but no evisceration  Lab Results:   Basename 01/28/12 0325 01/27/12 0420  WBC 7.5 8.2  HGB 11.3* 11.5*  HCT 35.1* 36.1*  PLT 467* 398   BMET  Basename 01/28/12 0325 01/27/12 0420  NA 139 142  K 3.5 3.8  CL 107 111  CO2 24 21  GLUCOSE 157* 126*  BUN 15 19  CREATININE 0.52 0.64  CALCIUM 8.1* 8.1*   PT/INR No results found for this basename: LABPROT:2,INR:2 in the last 72 hours Comprehensive Metabolic Panel:    Component Value Date/Time   NA 139 01/28/2012 0325   K 3.5 01/28/2012 0325   CL 107 01/28/2012 0325   CO2 24 01/28/2012 0325   BUN 15 01/28/2012 0325   CREATININE 0.52 01/28/2012 0325   GLUCOSE 157* 01/28/2012 0325   CALCIUM 8.1* 01/28/2012 0325   AST 83* 01/27/2012 0420   ALT 175* 01/27/2012 0420   ALKPHOS 74 01/27/2012 0420   BILITOT 0.5 01/27/2012 0420   PROT 6.4 01/27/2012 0420   ALBUMIN 1.9* 01/27/2012 0420     Studies/Results: Ct Abdomen Pelvis Wo Contrast  01/27/2012  *RADIOLOGY REPORT*  Clinical Data: Ostomy retraction  CT ABDOMEN AND PELVIS WITHOUT CONTRAST  Technique:  Multidetector CT imaging of the abdomen and pelvis was performed following the standard  protocol without intravenous contrast.  Comparison: 01/24/2012  Findings: Images which include the lung bases show bibasilar collapse / consolidation with tiny left pleural effusion.  No focal abnormalities seen in the liver on this study performed without intravenous contrast.  No focal abnormalities seen in the spleen.  The stomach, duodenum, pancreas, and adrenal glands are unremarkable.  High attenuation in the lumen of the gallbladder is probably related to sludge and focal area of decreased attenuation in the neck of the gallbladder may be related to a gallstone. Kidneys have normal uninfused features.  The left percutaneous pigtail catheter is coiled in the fluid collection which tracks up and down the left pericolic gutter. There is near the peroneal cavity cranial to the catheter which may be related to infection or the presence of the catheter itself.  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  The stoma does appear to be slightly retracted on today's study compared to the previous exam, but there is no CT findings to account for this. Some of the gas and mesenteric/omental edema from the left abdominal abscess tracks up towards the stoma.  No abdominal aortic aneurysm.  Scattered numerous small lymph nodes are seen in the root of the small bowel mesentery.  Open midline wound is new in the interval.  Imaging through the pelvis shows a Foley catheter which decompresses the urinary bladder.  Air in  the bladder is compatible with the instrumentation. A long Hartmann's pouch is noted with a surgical suture line in the stump near the junction of the descending and sigmoid segments.  The fluid collection in the left paracolic gutter tracks down towards the pelvis.  There is a trace amount of free fluid in the pelvis.  The terminal ileum is normal. The appendix is normal.  There is no evidence for small bowel obstruction.  No colonic obstruction proximal to this ostomy and contrast material can be  seen in the ostomy bag.  IMPRESSION: The stoma from the transverse colostomy does appear to be slightly retracted below the skin when comparing to the study from 3 days ago.  The etiology for this change in appearance is not evident on the CT scan.  There is no large or small bowel obstruction.  Interval decrease in size of the left pericolic gutter collection of fluid and gas.   Original Report Authenticated By: ERIC A. MANSELL, M.D.    Dg Chest Port 1 View  01/26/2012  *RADIOLOGY REPORT*  Clinical Data: Confirm PICC line placement  PORTABLE CHEST - 1 VIEW  Comparison: 01/25/2012; 01/24/2012;  Findings:  Interval placement of right upper extremity approach PICC line with tip overlying the superior cavoatrial junction.  Interval extubation and removal of enteric tube.  Left-sided central venous line tip remains over the superior cavoatrial junction.  Lung volumes remain persistently reduced.  Grossly unchanged perihilar and basilar heterogeneous opacities.  The pulmonary vasculature remains indistinct.  No definite pleural effusion.  No pneumothorax.  Unchanged bones.  IMPRESSION: 1. Right extremity approach PICC line tip terminates over the superior cavoatrial junction. 2.  Interval extubation and removal of enteric tube.  Stable positioning of left upper extremity approach central venous catheter.  No pneumothorax. 3.  Persistently reduced lung volumes with perihilar and bibasilar opacities, likely atelectasis. 4.  Mild pulmonary edema suspected.   Original Report Authenticated By: Waynard Reeds, M.D.     Anti-infectives: Anti-infectives     Start     Dose/Rate Route Frequency Ordered Stop   01/28/12 2300   vancomycin (VANCOCIN) 2,000 mg in sodium chloride 0.9 % 500 mL IVPB  Status:  Discontinued        2,000 mg 250 mL/hr over 120 Minutes Intravenous Every 12 hours 01/27/12 2239 01/27/12 2243   01/28/12 1200   vancomycin (VANCOCIN) 2,000 mg in sodium chloride 0.9 % 500 mL IVPB  Status:   Discontinued        2,000 mg 250 mL/hr over 120 Minutes Intravenous Every 12 hours 01/27/12 2233 01/27/12 2238   01/27/12 2300   vancomycin (VANCOCIN) 750 mg in sodium chloride 0.9 % 150 mL IVPB  Status:  Discontinued        750 mg 150 mL/hr over 60 Minutes Intravenous  Once 01/27/12 2232 01/27/12 2233   01/27/12 2300   vancomycin (VANCOCIN) 2,000 mg in sodium chloride 0.9 % 500 mL IVPB        2,000 mg 250 mL/hr over 120 Minutes Intravenous Every 12 hours 01/27/12 2244     01/25/12 1045   vancomycin (VANCOCIN) 1,250 mg in sodium chloride 0.9 % 250 mL IVPB  Status:  Discontinued        1,250 mg 166.7 mL/hr over 90 Minutes Intravenous 2 times daily 01/25/12 1020 01/27/12 2237   01/23/12 1300   micafungin (MYCAMINE) 100 mg in sodium chloride 0.9 % 100 mL IVPB        100 mg  100 mL/hr over 1 Hours Intravenous Daily 01/23/12 1158     01/16/12 1800  piperacillin-tazobactam (ZOSYN) IVPB 3.375 g       3.375 g 12.5 mL/hr over 240 Minutes Intravenous 3 times per day 01/16/12 1721 01/27/12 1012          Assessment Principal Problem:  *Colon obstruction Active Problems:  Diverticulitis of colon with perforation 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  Open abdominal wound with areas of fascial dehiscence  PC malnutrition-on TPN     LOS: 12 days   Plan: Place abdominal binder.  OOB to chair.  SDU status.  Start clear liquids.   Brand Siever J 01/28/2012

## 2012-01-28 NOTE — Progress Notes (Signed)
Subjective: Pt ok. Resting well Repeat CT done yesterday  Objective: Physical Exam: BP 137/77  Pulse 81  Temp 97.7 F (36.5 C) (Oral)  Resp 24  Ht 5\' 10"  (1.778 m)  Wt 273 lb 2.4 oz (123.9 kg)  BMI 39.19 kg/m2  SpO2 99% Drain intact, site clean Output remains purulent   Labs: CBC  Basename 01/28/12 0325 01/27/12 0420  WBC 7.5 8.2  HGB 11.3* 11.5*  HCT 35.1* 36.1*  PLT 467* 398   BMET  Basename 01/28/12 0325 01/27/12 0420  NA 139 142  K 3.5 3.8  CL 107 111  CO2 24 21  GLUCOSE 157* 126*  BUN 15 19  CREATININE 0.52 0.64  CALCIUM 8.1* 8.1*   LFT  Basename 01/27/12 0420  PROT 6.4  ALBUMIN 1.9*  AST 83*  ALT 175*  ALKPHOS 74  BILITOT 0.5  BILIDIR --  IBILI --  LIPASE --   PT/INR No results found for this basename: LABPROT:2,INR:2 in the last 72 hours   Studies/Results: Ct Abdomen Pelvis Wo Contrast  01/27/2012  *RADIOLOGY REPORT*  Clinical Data: Ostomy retraction  CT ABDOMEN AND PELVIS WITHOUT CONTRAST  Technique:  Multidetector CT imaging of the abdomen and pelvis was performed following the standard protocol without intravenous contrast.  Comparison: 01/24/2012  Findings: Images which include the lung bases show bibasilar collapse / consolidation with tiny left pleural effusion.  No focal abnormalities seen in the liver on this study performed without intravenous contrast.  No focal abnormalities seen in the spleen.  The stomach, duodenum, pancreas, and adrenal glands are unremarkable.  High attenuation in the lumen of the gallbladder is probably related to sludge and focal area of decreased attenuation in the neck of the gallbladder may be related to a gallstone. Kidneys have normal uninfused features.  The left percutaneous pigtail catheter is coiled in the fluid collection which tracks up and down the left pericolic gutter. There is near the peroneal cavity cranial to the catheter which may be related to infection or the presence of the catheter itself.  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  The stoma does appear to be slightly retracted on today's study compared to the previous exam, but there is no CT findings to account for this. Some of the gas and mesenteric/omental edema from the left abdominal abscess tracks up towards the stoma.  No abdominal aortic aneurysm.  Scattered numerous small lymph nodes are seen in the root of the small bowel mesentery.  Open midline wound is new in the interval.  Imaging through the pelvis shows a Foley catheter which decompresses the urinary bladder.  Air in the bladder is compatible with the instrumentation. A long Hartmann's pouch is noted with a surgical suture line in the stump near the junction of the descending and sigmoid segments.  The fluid collection in the left paracolic gutter tracks down towards the pelvis.  There is a trace amount of free fluid in the pelvis.  The terminal ileum is normal. The appendix is normal.  There is no evidence for small bowel obstruction.  No colonic obstruction proximal to this ostomy and contrast material can be seen in the ostomy bag.  IMPRESSION: The stoma from the transverse colostomy does appear to be slightly retracted below the skin when comparing to the study from 3 days ago.  The etiology for this change in appearance is not evident on the CT scan.  There is no large or small bowel obstruction.  Interval decrease in  size of the left pericolic gutter collection of fluid and gas.   Original Report Authenticated By: ERIC A. MANSELL, M.D.    Dg Chest Port 1 View  01/26/2012  *RADIOLOGY REPORT*  Clinical Data: Confirm PICC line placement  PORTABLE CHEST - 1 VIEW  Comparison: 01/25/2012; 01/24/2012;  Findings:  Interval placement of right upper extremity approach PICC line with tip overlying the superior cavoatrial junction.  Interval extubation and removal of enteric tube.  Left-sided central venous line tip remains over the superior cavoatrial junction.  Lung  volumes remain persistently reduced.  Grossly unchanged perihilar and basilar heterogeneous opacities.  The pulmonary vasculature remains indistinct.  No definite pleural effusion.  No pneumothorax.  Unchanged bones.  IMPRESSION: 1. Right extremity approach PICC line tip terminates over the superior cavoatrial junction. 2.  Interval extubation and removal of enteric tube.  Stable positioning of left upper extremity approach central venous catheter.  No pneumothorax. 3.  Persistently reduced lung volumes with perihilar and bibasilar opacities, likely atelectasis. 4.  Mild pulmonary edema suspected.   Original Report Authenticated By: Waynard Reeds, M.D.     Assessment/Plan: S/p (L)sided peritoneal abscess drainage 10/8 Cont to follow CT shows interval decrease in size after 4 days of drainage. Cont to monitor output and clinical course  LOS: 12 days    Brayton El PA-C 01/28/2012 7:37 AM

## 2012-01-28 NOTE — Progress Notes (Signed)
PARENTERAL NUTRITION CONSULT NOTE - Follow Up  Pharmacy Consult for TNA (restart) Indication: s/p Hartmann, possible bowel leak  No Known Allergies  Patient Measurements: Height: 5\' 10"  (177.8 cm) Weight: 273 lb 2.4 oz (123.9 kg) IBW/kg (Calculated) : 73  Adjusted Body Weight: 95kg  Vital Signs: Temp: 98.3 F (36.8 C) (10/12 0800) Temp src: Oral (10/12 0800) BP: 137/77 mmHg (10/12 0600) Pulse Rate: 81  (10/12 0600) Intake/Output from previous day: 10/11 0701 - 10/12 0700 In: 2484 [P.O.:540; I.V.:340; IV Piggyback:889; TPN:700] Out: 2590 [Urine:2225; Drains:65; Stool:300] Intake/Output from this shift:    Labs:  [facility] 01/28/12 0325 01/27/12 0420 01/26/12 0545  WBC 7.5 8.2 9.8  HGB 11.3* 11.5* 11.9*  HCT 35.1* 36.1* 37.5*  PLT 467* 398 328  APTT -- -- --  INR -- -- --     Basename 01/28/12 0325 01/27/12 0420 01/26/12 1155 01/26/12 0545  NA 139 142 144 --  K 3.5 3.8 4.0 --  CL 107 111 112 --  CO2 24 21 21  --  GLUCOSE 157* 126* 123* --  BUN 15 19 25* --  CREATININE 0.52 0.64 0.75 --  LABCREA -- -- -- --  CREAT24HRUR -- -- -- --  CALCIUM 8.1* 8.1* 8.0* --  MG -- -- -- 2.6*  PHOS -- -- -- 2.5  PROT -- 6.4 6.8 6.4  ALBUMIN -- 1.9* 2.1* 2.0*  AST -- 83* 198* 210*  ALT -- 175* 230* 198*  ALKPHOS -- 74 79 70  BILITOT -- 0.5 0.6 0.6  BILIDIR -- -- -- --  IBILI -- -- -- --  PREALBUMIN -- -- -- --  TRIG -- -- -- --  CHOLHDL -- -- -- --  CHOL -- -- -- --   Estimated Creatinine Clearance: 162.2 ml/min (by C-G formula based on Cr of 0.52).    Medical History: Past Medical History  Diagnosis Date  . Urinary anastomotic stricture undiagnosed     hard to be cathed; urology did the last time  . Diverticulitis   . Colon obstruction 01/17/2012  . Obesity, Class III, BMI 40-49.9 (morbid obesity) 01/17/2012    Medications:  Scheduled:     . antiseptic oral rinse  15 mL Mouth Rinse q12n4p  . chlorhexidine  15 mL Mouth Rinse BID  . enoxaparin  60 mg  Subcutaneous Q24H  . insulin aspart  0-20 Units Subcutaneous Q4H  . insulin glargine  10 Units Subcutaneous QHS  . metoprolol  5 mg Intravenous Q6H  . micafungin (MYCAMINE) IV  100 mg Intravenous Daily  . pantoprazole (PROTONIX) IV  40 mg Intravenous QHS  . piperacillin-tazobactam (ZOSYN)  IV  3.375 g Intravenous Q8H  . sodium chloride  10-40 mL Intracatheter Q12H  . vancomycin  2,000 mg Intravenous Q12H  . DISCONTD: antiseptic oral rinse  15 mL Mouth Rinse QID  . DISCONTD: furosemide  40 mg Intravenous Daily  . DISCONTD: potassium chloride  30 mEq Per Tube Q8H  . DISCONTD: potassium chloride  40 mEq Per Tube Daily  . DISCONTD: vancomycin  1,250 mg Intravenous BID  . DISCONTD: vancomycin  2,000 mg Intravenous Q12H  . DISCONTD: vancomycin  2,000 mg Intravenous Q12H  . DISCONTD: vancomycin  750 mg Intravenous Once   Infusions:     . dextrose 500 mL (01/26/12 2026)  . fat emulsion 240 mL (01/27/12 1718)  . TPN (CLINIMIX) +/- additives 40 mL/hr at 01/27/12 1715  . DISCONTD: feeding supplement (VITAL AF 1.2 CAL) 1,000 mL (01/26/12 0520)  . DISCONTD:  TPN (CLINIMIX) +/- additives     PRN: acetaminophen (TYLENOL) oral liquid 160 mg/5 mL, albuterol-ipratropium, alum & mag hydroxide-simeth, fentaNYL, hydrALAZINE, magic mouthwash, ondansetron, sodium chloride   CBG and Insulin Requirements in the past 24 hours:  CBG 103-154 Lantus 10 units sq qhs SSI q4h, resistant scale, used 13 units  Nutritional Goals:  Re-estimated needs per RD 10/11: 2300-2500 kcal, 130-160 gm protein Clinimix E 5/20% @ 15ml/hr + Lipids 20% @ 69ml/hr on MWF only = avg 2529 kcal/ day and 132 g protein daily  Current Nutrition:  NPO  Assessment: 41 yom presented 9/30 with abd pain x 1 month, N/V. CT w/ stricture of descending colon with colonic obstruction. 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 Complicated by hypoxemic respiratory failure requiring intubation 10/2. TNA started 10/4.  Per RD note, pt reports poor PO intake prior to admission, nausea, dry heaves, and lack of appetite. Pt reports 29 lb unintentional weight loss in the past month. TF started 10/7, and was titrated up. TNA was d/c 10/9. Now suspected bowel leak, possible need for re-exploration. TF d/c and TNA to resume.  Labs  CBG: acceptable control on current insulin regimen  Electrolytes: wnl Renal function: Scr wnl/stable  Hepatic function: Elevated, trending down (10/11) Pre-Albumin: 5.5 (10/7)  TG/Cholesterol: TG elevated 10/5 (422), 10/7 down to 341  Plan:   Advance Clinimix E 5/20% tonight @1800  to 23ml/hr, advance to goal rate as tolerated  TNA to contain IV fat emulsion, standard multivitamins and trace elements only on MWF only due to ongoing shortage   Continue SSI resistant scale q4h, continue Lantus as ordered by MD.   Routine TNA labs Monday/Thursdays  Pharmacy will follow up daily  Gwen Her PharmD  [phone] 01/28/2012 8:47 AM

## 2012-01-29 ENCOUNTER — Inpatient Hospital Stay (HOSPITAL_COMMUNITY): Payer: Medicaid Other

## 2012-01-29 DIAGNOSIS — E876 Hypokalemia: Secondary | ICD-10-CM

## 2012-01-29 LAB — GLUCOSE, CAPILLARY
Glucose-Capillary: 230 mg/dL — ABNORMAL HIGH (ref 70–99)
Glucose-Capillary: 112 mg/dL — ABNORMAL HIGH (ref 70–99)
Glucose-Capillary: 130 mg/dL — ABNORMAL HIGH (ref 70–99)

## 2012-01-29 LAB — BASIC METABOLIC PANEL
Calcium: 7.8 mg/dL — ABNORMAL LOW (ref 8.4–10.5)
GFR calc Af Amer: >90 mL/min (ref >90)
GFR calc non Af Amer: >90 mL/min (ref >90)
Potassium: 3.2 mEq/L — ABNORMAL LOW (ref 3.5–5.1)
Sodium: 134 mEq/L — ABNORMAL LOW (ref 135–145)

## 2012-01-29 LAB — MAGNESIUM: Magnesium: 2.2 mg/dL (ref 1.5–2.5)

## 2012-01-29 MED ORDER — POTASSIUM CHLORIDE 10 MEQ/100ML IV SOLN
10.0000 meq | INTRAVENOUS | Status: AC
  Administered 2012-01-29 (×4): 10 meq via INTRAVENOUS
  Filled 2012-01-29: qty 400

## 2012-01-29 MED ORDER — DEXTROSE 10 % IV SOLN
INTRAVENOUS | Status: AC
  Administered 2012-01-29: 12:00:00 via INTRAVENOUS

## 2012-01-29 MED ORDER — INSULIN GLARGINE 100 UNIT/ML ~~LOC~~ SOLN
15.0000 [IU] | Freq: Every day | SUBCUTANEOUS | Status: DC
  Administered 2012-01-29: 15 [IU] via SUBCUTANEOUS

## 2012-01-29 MED ORDER — FUROSEMIDE 10 MG/ML IJ SOLN
20.0000 mg | Freq: Every day | INTRAMUSCULAR | Status: DC
  Administered 2012-01-29 – 2012-01-30 (×2): 20 mg via INTRAVENOUS
  Filled 2012-01-29 (×2): qty 2

## 2012-01-29 MED ORDER — POTASSIUM CHLORIDE 20 MEQ PO PACK: 40.0000 meq | PACK | Freq: Two times a day (BID) | ORAL | Status: DC

## 2012-01-29 MED ORDER — CLINIMIX E/DEXTROSE (5/20) 5 % IV SOLN
INTRAVENOUS | Status: DC
  Administered 2012-01-29: 18:00:00 via INTRAVENOUS

## 2012-01-29 MED ORDER — POTASSIUM CHLORIDE CRYS ER 20 MEQ PO TBCR: 40.0000 meq | EXTENDED_RELEASE_TABLET | Freq: Two times a day (BID) | ORAL | Status: DC

## 2012-01-29 NOTE — Progress Notes (Signed)
PARENTERAL NUTRITION CONSULT NOTE - Follow Up  Pharmacy Consult for TNA (restart) Indication: s/p Hartmann, possible bowel leak  No Known Allergies  Patient Measurements: Height: 5\' 10"  (177.8 cm) Weight: 280 lb 13.9 oz (127.4 kg) IBW/kg (Calculated) : 73  Adjusted Body Weight: 95kg  Vital Signs: Temp: 98.4 F (36.9 C) (10/13 0401) Temp src: Oral (10/13 0401) BP: 135/76 mmHg (10/13 0400) Pulse Rate: 75  (10/13 0400) Intake/Output from previous day: 10/12 0701 - 10/13 0700 In: 4300 [P.O.:1520; I.V.:380; IV Piggyback:1110; TPN:1280] Out: 2545 [Urine:2230; Drains:15; Stool:300] Intake/Output from this shift:    Labs:  [facility] 01/28/12 0325 01/27/12 0420  WBC 7.5 8.2  HGB 11.3* 11.5*  HCT 35.1* 36.1*  PLT 467* 398  APTT -- --  INR -- --     Basename 01/29/12 0350 01/28/12 0325 01/27/12 0420 01/26/12 1155  NA 134* 139 142 --  K 3.2* 3.5 3.8 --  CL 102 107 111 --  CO2 25 24 21  --  GLUCOSE 150* 157* 126* --  BUN 9 15 19  --  CREATININE 0.51 0.52 0.64 --  LABCREA -- -- -- --  CREAT24HRUR -- -- -- --  CALCIUM 7.8* 8.1* 8.1* --  MG 2.2 -- -- --  PHOS 2.5 -- -- --  PROT -- -- 6.4 6.8  ALBUMIN -- -- 1.9* 2.1*  AST -- -- 83* 198*  ALT -- -- 175* 230*  ALKPHOS -- -- 74 79  BILITOT -- -- 0.5 0.6  BILIDIR -- -- -- --  IBILI -- -- -- --  PREALBUMIN -- -- -- --  TRIG -- -- -- --  CHOLHDL -- -- -- --  CHOL -- -- -- --   Estimated Creatinine Clearance: 164.6 ml/min (by C-G formula based on Cr of 0.51).  Corrected Calcium = 9.5 (10/13)  Medical History: Past Medical History  Diagnosis Date  . Urinary anastomotic stricture undiagnosed     hard to be cathed; urology did the last time  . Diverticulitis   . Colon obstruction 01/17/2012  . Obesity, Class III, BMI 40-49.9 (morbid obesity) 01/17/2012    Medications:  Scheduled:     . antiseptic oral rinse  15 mL Mouth Rinse q12n4p  . chlorhexidine  15 mL Mouth Rinse BID  . enoxaparin  60 mg Subcutaneous Q24H  .  insulin aspart  0-20 Units Subcutaneous Q4H  . insulin glargine  10 Units Subcutaneous QHS  . metoprolol  5 mg Intravenous Q6H  . micafungin (MYCAMINE) IV  100 mg Intravenous Daily  . pantoprazole (PROTONIX) IV  40 mg Intravenous QHS  . potassium chloride  10 mEq Intravenous Q1 Hr x 4  . sodium chloride  10-40 mL Intracatheter Q12H  . vancomycin  2,000 mg Intravenous Q12H   Infusions:     . dextrose 20 mL/hr at 01/28/12 1546  . fat emulsion 240 mL (01/27/12 1718)  . TPN (CLINIMIX) +/- additives 40 mL/hr at 01/27/12 1715  . TPN (CLINIMIX) +/- additives 60 mL/hr at 01/28/12 1728   PRN: acetaminophen (TYLENOL) oral liquid 160 mg/5 mL, albuterol-ipratropium, alum & mag hydroxide-simeth, fentaNYL, hydrALAZINE, magic mouthwash, ondansetron, sodium chloride   CBG and Insulin Requirements in the past 24 hours:  CBG 123-201 Lantus 10 units sq qhs SSI q4h, resistant scale, used 23 units  Nutritional Goals:  Re-estimated needs per RD 10/11: 2300-2500 kcal, 130-160 gm protein Clinimix E 5/20% @ 149ml/hr + Lipids 20% @ 45ml/hr on MWF only = avg 2529 kcal/ day and 132 g protein daily  Current Nutrition:  CL diet (10/12)  Assessment: 41 yom presented 9/30 with abd pain x 1 month, N/V. CT w/ stricture of descending colon with colonic obstruction. 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 Complicated by hypoxemic respiratory failure requiring intubation 10/2. TNA started 10/4. Per RD note, pt reports poor PO intake prior to admission, nausea, dry heaves, and lack of appetite. Pt reports 29 lb unintentional weight loss in the past month. TF started 10/7, and was titrated up. TNA was d/c 10/9. Now suspected bowel leak, possible need for re-exploration. TF d/c and TNA to resume 10/11. CLD started 10/12  Labs  CBG: increased insulin requirements on increased TNA rate Electrolytes: K low, MD has ordered replacement Renal function: Scr wnl/stable  Hepatic function:  Elevated, trending down (10/11) Pre-Albumin: 5.5 (10/7)  TG/Cholesterol: TG elevated 10/5 (422), 10/7 down to 341  Plan:   Advance Clinimix E 5/20% tonight @1800  to 9ml/hr, advance to goal rate as tolerated  Increase Lantus to 15 units sq qhs  Monitor diet advancement and toleration to wean TNA  TNA to contain IV fat emulsion, standard multivitamins and trace elements only on MWF only due to ongoing shortage   Continue SSI resistant scale q4h, continue Lantus as ordered by MD.   Routine TNA labs Monday/Thursdays  Pharmacy will follow up daily  Gwen Her PharmD  [phone] 01/29/2012 8:15 AM

## 2012-01-29 NOTE — Progress Notes (Signed)
Name: [name] MRN: [id number] DOB: 1970/10/16    LOS: 13  Referring Provider:  CCS Reason for Referral:  Acute respiratory failure  PULMONARY / CRITICAL CARE MEDICINE  Brief patient description:  41 yo morbidly obese male with suspected OSA/OHS admitted 9/30 with perf bowel due to stricture, + peritonitis, s/p exploratory laparotomy with partial colectomy and colostomy (10/1).  The post op course has been complicated by hypoxemic respiratory failure requiring intubation.  Events Since Admission: 9/30  Admitted with abdominal pain, nausea and vomiting 10/1  Exploratory laparotomy, partial colectomy, colostomy; peritoneal abscess noted 10/2  Intubated for hypoxemic respiratory failure 10/7: persistent fevers, CT of abdomen and pelvis demonstrated large left-sided fluid collection, and the left abdomen. 10/8: Percutaneous drain placed in left lower quadrant fluid collection by interventional radiology 10/10: Self extubated over night, but tolerating [place] O2 10/11- CT improved  Vital Signs: Temp:  [98.1 F (36.7 C)-99.7 F (37.6 C)] 98.4 F (36.9 C) (10/13 0401) Pulse Rate:  [75-100] 75  (10/13 0400) Resp:  [27-33] 32  (10/13 0400) BP: (114-137)/(73-109) 135/76 mmHg (10/13 0400) SpO2:  [94 %-98 %] 96 % (10/13 0400) Weight:  [127.4 kg (280 lb 13.9 oz)] 127.4 kg (280 lb 13.9 oz) (10/13 0000) 2 liters   Physical Examination:  Gen: Obese, in bed, spirits improved HEENT:  PERRL PULM: CTA CV: RRR, no mgr AB: BS+, wound well granulated colostomy in place, ostomy deep, no changes Ext: warm, scds Neuro: Alert, oriented x 3, follows commands,   Principal Problem:  *Colon obstruction Active Problems:  UTI (lower urinary tract infection)  Diverticulitis of colon without hemorrhage  Obesity, Class III, BMI 40-49.9 (morbid obesity)  Acute respiratory failure with hypoxia  Acute pulmonary edema  Encephalopathy acute  Tobacco abuse  Intra-abdominal abscess, diverticular s/p  surgical drainage  Hypertriglyceridemia  ASSESSMENT AND PLAN  PULMONARY  Lab 01/24/12 0345 01/23/12 0355  PHART 7.489* 7.496*  PCO2ART 33.8* 41.7  PO2ART 83.1 79.0*  HCO3 25.0* 31.6*  O2SAT 95.1 95.1   Ventilator Settings:    CXR:  10/13- atx ETT:  10/2 >>>10/10 self extubated  A:   1) Acute hypoxemic / hypercarbic respiratory failure secondary to acute pulmonary edema , ALI and  Atelectasis. 2) Suspected OSA/OHS. (self extubated on 10/10).  3) small lung volumes P:   Mobilize with PT/OT, to chair IS pcxr in am for atx assessment-reviewed Counseled Consider lasix  CARDIOVASCULAR No results found for this basename: TROPONINI:5,LATICACIDVEN:5, O2SATVEN:5,PROBNP:5 in the last 168 hours Lines: R [place] TLC 10/2 >>>10/10 Right PICC 10/11>>>  A: Hemodynamically stable.   P:  Consider dc tele  RENAL  Lab 01/29/12 0350 01/28/12 0325 01/27/12 0420 01/26/12 1155 01/26/12 0545 01/23/12 0420  NA 134* 139 142 144 143 --  K 3.2* 3.5 -- -- -- --  CL 102 107 111 112 112 --  CO2 25 24 21 21 21  --  BUN 9 15 19  25* 28* --  CREATININE 0.51 0.52 0.64 0.75 0.77 --  CALCIUM 7.8* 8.1* 8.1* 8.0* 8.0* --  MG 2.2 -- -- -- 2.6* 2.9*  PHOS 2.5 -- -- -- 2.5 3.3   Intake/Output      10/12 0701 - 10/13 0700 10/13 0701 - 10/14 0700   P.O. 1520    I.V. (mL/kg) 380 (3)    Other 10    IV Piggyback 1110    TPN 1280    Total Intake(mL/kg) 4300 (33.8)    Urine (mL/kg/hr) 2230 (0.7)    Drains 15  Stool 300    Total Output 2545    Net +1755           Intake/Output Summary (Last 24 hours) at 01/29/12 0837 Last data filed at 01/29/12 0600  Gross per 24 hour  Intake   4230 ml  Output   2445 ml  Net   1785 ml   Foley:  10/1 >>>  A:   1) Hypernatremia-resolved 2) pos balance P:   F/u chem in am kvo Dose lasix k supp  GASTROINTESTINAL  Lab 01/27/12 0420 01/26/12 1155 01/26/12 0545 01/24/12 0535 01/23/12 0420  AST 83* 198* 210* 47* 34  ALT 175* 230* 198* 32 20  ALKPHOS 74 79  70 60 64  BILITOT 0.5 0.6 0.6 0.8 0.5  PROT 6.4 6.8 6.4 6.4 6.5  ALBUMIN 1.9* 2.1* 2.0* 2.0* 2.0*   A:   Status post exploratory lap, partial colectomy, colostomy Perc drain for abdominal abscess - (see ID section)  CT abd 10/11>>> P:   Clears started per surgery, and tolerated well Post op care per CCS  HEMATOLOGIC  Lab 01/28/12 0325 01/27/12 0420 01/26/12 0545 01/24/12 0900 01/24/12 0535 01/23/12 0420  HGB 11.3* 11.5* 11.9* -- 12.5* 13.1  HCT 35.1* 36.1* 37.5* -- 38.9* 40.6  PLT 467* 398 328 -- 309 361  INR -- -- -- 1.19 -- --  APTT -- -- -- 32 -- --   A:   1) thrombocytosis P:  lovenox Reactive plat Ambulation a must  INFECTIOUS  Lab 01/28/12 0325 01/27/12 0420 01/26/12 0545 01/24/12 0535 01/23/12 0420  WBC 7.5 8.2 9.8 11.8* 12.4*  PROCALCITON -- -- -- -- --   Cultures: Blood culture x2 10/3: negative Peritoneal fluid 10/8:abundant enterococcus (amp resist)  Antibiotics: Zosyn 9/30 >>>plan stop 10/12 Micafungin 10/07>>stop planned 10/14 Vancomycin 10/9>>>  A:   Peritonitis, diverticular abscess with persistent fevers. Left lower quadrant intra-abdominal abscess 10/7 status post cutaneous drain on 10/8 - Ct and clinically improved  P:   Continue vanc, will need 14 days min vanc  stop date mycofungin in place Percutaneous drain management per surgery  ENDOCRINE  Lab 01/29/12 0817 01/29/12 0343 01/28/12 2359 01/28/12 2030 01/28/12 1546  GLUCAP 230* 144* 134* 151* 123*   A:  Hyperglycemia. Glycemic control P:   Goal CBG 120 to 180 SSI Lantus, d5 now off  NEUROLOGIC  A:  Acute encephalopathy secondary to sedation. - Now resolved  Post Op pain P:   Supportive care Pain management IS Mobilize PT All ordered  BEST PRACTICE / DISPOSITION Level of Care:  To floor, traid Primary Service:  CCS Consultants:  PCCM Code Status:  Full Diet:  TF DVT Px:  Protonix GI Px:  Lovenox Skin Integrity:  Intact Social / Family:  Updated family at  bedside   Mcarthur Rossetti. Tyson Alias, MD, FACP Pgr: [phone] [facility]

## 2012-01-29 NOTE — Progress Notes (Signed)
Patient transferring to room 1528.  Report called to Gunnar Fusi, Charity fundraiser.  Patient will be transferred by bed.  Will continue to monitor.

## 2012-01-29 NOTE — Progress Notes (Signed)
Patient's TNA bag empty. Per order, patient is to be on TNA at 60cc/hr until 1800, then increased to a rate of 80cc/hr.  Notified Jill Alexanders, [place] in pharmacy.  Will initiate D10 per order and TNA will be restarted at 1800.

## 2012-01-29 NOTE — Progress Notes (Signed)
12 Days Post-Op  Subjective: Tolerating clear liquids.  Objective: Vital signs in last 24 hours: Temp:  [98.1 F (36.7 C)-99.7 F (37.6 C)] 98.4 F (36.9 C) (10/13 0401) Pulse Rate:  [75-100] 75  (10/13 0400) Resp:  [27-33] 32  (10/13 0400) BP: (114-137)/(73-109) 135/76 mmHg (10/13 0400) SpO2:  [94 %-98 %] 96 % (10/13 0400) Weight:  [280 lb 13.9 oz (127.4 kg)] 280 lb 13.9 oz (127.4 kg) (10/13 0000) Last BM Date: 01/28/12  Intake/Output from previous day: 10/12 0701 - 10/13 0700 In: 4300 [P.O.:1520; I.V.:380; IV Piggyback:1110; TPN:1280] Out: 2545 [Urine:2230; Drains:15; Stool:300] Intake/Output this shift:    PE: Abd-soft, obese, colostomy retracted but draining brown liquid stool, LLQ drain output more serous appearing, open midline wound with areas of fascia dehiscence (stuck) but no evisceration; wound is much cleaner  Lab Results:   Basename 01/28/12 0325 01/27/12 0420  WBC 7.5 8.2  HGB 11.3* 11.5*  HCT 35.1* 36.1*  PLT 467* 398   BMET  Basename 01/29/12 0350 01/28/12 0325  NA 134* 139  K 3.2* 3.5  CL 102 107  CO2 25 24  GLUCOSE 150* 157*  BUN 9 15  CREATININE 0.51 0.52  CALCIUM 7.8* 8.1*   PT/INR No results found for this basename: LABPROT:2,INR:2 in the last 72 hours Comprehensive Metabolic Panel:    Component Value Date/Time   NA 134* 01/29/2012 0350   K 3.2* 01/29/2012 0350   CL 102 01/29/2012 0350   CO2 25 01/29/2012 0350   BUN 9 01/29/2012 0350   CREATININE 0.51 01/29/2012 0350   GLUCOSE 150* 01/29/2012 0350   CALCIUM 7.8* 01/29/2012 0350   AST 83* 01/27/2012 0420   ALT 175* 01/27/2012 0420   ALKPHOS 74 01/27/2012 0420   BILITOT 0.5 01/27/2012 0420   PROT 6.4 01/27/2012 0420   ALBUMIN 1.9* 01/27/2012 0420     Studies/Results: Ct Abdomen Pelvis Wo Contrast  01/27/2012  *RADIOLOGY REPORT*  Clinical Data: Ostomy retraction  CT ABDOMEN AND PELVIS WITHOUT CONTRAST  Technique:  Multidetector CT imaging of the abdomen and pelvis was performed  following the standard protocol without intravenous contrast.  Comparison: 01/24/2012  Findings: Images which include the lung bases show bibasilar collapse / consolidation with tiny left pleural effusion.  No focal abnormalities seen in the liver on this study performed without intravenous contrast.  No focal abnormalities seen in the spleen.  The stomach, duodenum, pancreas, and adrenal glands are unremarkable.  High attenuation in the lumen of the gallbladder is probably related to sludge and focal area of decreased attenuation in the neck of the gallbladder may be related to a gallstone. Kidneys have normal uninfused features.  The left percutaneous pigtail catheter is coiled in the fluid collection which tracks up and down the left pericolic gutter. There is near the peroneal cavity cranial to the catheter which may be related to infection or the presence of the catheter itself.  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  The stoma does appear to be slightly retracted on today's study compared to the previous exam, but there is no CT findings to account for this. Some of the gas and mesenteric/omental edema from the left abdominal abscess tracks up towards the stoma.  No abdominal aortic aneurysm.  Scattered numerous small lymph nodes are seen in the root of the small bowel mesentery.  Open midline wound is new in the interval.  Imaging through the pelvis shows a Foley catheter which decompresses the urinary bladder.  Air in the  bladder is compatible with the instrumentation. A long Hartmann's pouch is noted with a surgical suture line in the stump near the junction of the descending and sigmoid segments.  The fluid collection in the left paracolic gutter tracks down towards the pelvis.  There is a trace amount of free fluid in the pelvis.  The terminal ileum is normal. The appendix is normal.  There is no evidence for small bowel obstruction.  No colonic obstruction proximal to this ostomy and  contrast material can be seen in the ostomy bag.  IMPRESSION: The stoma from the transverse colostomy does appear to be slightly retracted below the skin when comparing to the study from 3 days ago.  The etiology for this change in appearance is not evident on the CT scan.  There is no large or small bowel obstruction.  Interval decrease in size of the left pericolic gutter collection of fluid and gas.   Original Report Authenticated By: ERIC A. MANSELL, M.D.    Dg Chest Port 1 View  01/29/2012  *RADIOLOGY REPORT*  Clinical Data: Atelectasis  PORTABLE CHEST - 1 VIEW  Comparison: 01/26/2012  Findings: Right PICC stable.  Left sided central venous catheter removed.  Bibasilar atelectasis improved.  Low volumes persist.  No pneumothorax.  Upper normal heart size. Pigtail catheter projects over the left upper quadrant.  IMPRESSION: Improved bibasilar atelectasis.  Lungs remain under aerated.   Original Report Authenticated By: Donavan Burnet, M.D.     Anti-infectives: Anti-infectives     Start     Dose/Rate Route Frequency Ordered Stop   01/28/12 2300   vancomycin (VANCOCIN) 2,000 mg in sodium chloride 0.9 % 500 mL IVPB  Status:  Discontinued        2,000 mg 250 mL/hr over 120 Minutes Intravenous Every 12 hours 01/27/12 2239 01/27/12 2243   01/28/12 1200   vancomycin (VANCOCIN) 2,000 mg in sodium chloride 0.9 % 500 mL IVPB  Status:  Discontinued        2,000 mg 250 mL/hr over 120 Minutes Intravenous Every 12 hours 01/27/12 2233 01/27/12 2238   01/27/12 2300   vancomycin (VANCOCIN) 750 mg in sodium chloride 0.9 % 150 mL IVPB  Status:  Discontinued        750 mg 150 mL/hr over 60 Minutes Intravenous  Once 01/27/12 2232 01/27/12 2233   01/27/12 2300   vancomycin (VANCOCIN) 2,000 mg in sodium chloride 0.9 % 500 mL IVPB        2,000 mg 250 mL/hr over 120 Minutes Intravenous Every 12 hours 01/27/12 2244     01/25/12 1045   vancomycin (VANCOCIN) 1,250 mg in sodium chloride 0.9 % 250 mL IVPB  Status:   Discontinued        1,250 mg 166.7 mL/hr over 90 Minutes Intravenous 2 times daily 01/25/12 1020 01/27/12 2237   01/23/12 1300   micafungin (MYCAMINE) 100 mg in sodium chloride 0.9 % 100 mL IVPB        100 mg 100 mL/hr over 1 Hours Intravenous Daily 01/23/12 1158 01/30/12 2359   01/16/12 1800  piperacillin-tazobactam (ZOSYN) IVPB 3.375 g       3.375 g 12.5 mL/hr over 240 Minutes Intravenous 3 times per day 01/16/12 1721 01/27/12 1012          Assessment Principal Problem:  *Colon obstruction Active Problems:  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  Full liquid diet. Correct hypokalemia.  PT.   Adolph Pollack 01/29/2012

## 2012-01-30 DIAGNOSIS — K56609 Unspecified intestinal obstruction, unspecified as to partial versus complete obstruction: Secondary | ICD-10-CM

## 2012-01-30 DIAGNOSIS — R5381 Other malaise: Secondary | ICD-10-CM

## 2012-01-30 LAB — CBC
MCH: 30.6 pg (ref 26.0–34.0)
MCHC: 33.4 g/dL (ref 30.0–36.0)
MCV: 91.6 fL (ref 78.0–100.0)
Platelets: 494 10*3/uL — ABNORMAL HIGH (ref 150–400)
RBC: 3.56 MIL/uL — ABNORMAL LOW (ref 4.22–5.81)
RDW: 13 % (ref 11.5–15.5)

## 2012-01-30 LAB — DIFFERENTIAL
Basophils Absolute: 0.1 10*3/uL (ref 0.0–0.1)
Eosinophils Absolute: 0.2 10*3/uL (ref 0.0–0.7)
Eosinophils Relative: 3 % (ref 0–5)
Lymphs Abs: 1.5 10*3/uL (ref 0.7–4.0)
Monocytes Absolute: 1.1 10*3/uL — ABNORMAL HIGH (ref 0.1–1.0)
Neutrophils Relative %: 61 % (ref 43–77)

## 2012-01-30 LAB — COMPREHENSIVE METABOLIC PANEL
Alkaline Phosphatase: 70 U/L (ref 39–117)
BUN: 8 mg/dL (ref 6–23)
CO2: 24 mEq/L (ref 19–32)
GFR calc Af Amer: >90 mL/min (ref >90)
GFR calc non Af Amer: >90 mL/min (ref >90)
Glucose, Bld: 167 mg/dL — ABNORMAL HIGH (ref 70–99)
Potassium: 3.1 mEq/L — ABNORMAL LOW (ref 3.5–5.1)
Total Protein: 6 g/dL (ref 6.0–8.3)

## 2012-01-30 LAB — GLUCOSE, CAPILLARY
Glucose-Capillary: 171 mg/dL — ABNORMAL HIGH (ref 70–99)
Glucose-Capillary: 145 mg/dL — ABNORMAL HIGH (ref 70–99)

## 2012-01-30 LAB — MAGNESIUM: Magnesium: 2.3 mg/dL (ref 1.5–2.5)

## 2012-01-30 LAB — CHOLESTEROL, TOTAL: Cholesterol: 137 mg/dL (ref 0–200)

## 2012-01-30 MED ORDER — POTASSIUM CHLORIDE 10 MEQ/100ML IV SOLN
10.0000 meq | INTRAVENOUS | Status: AC
  Administered 2012-01-30 (×3): 10 meq via INTRAVENOUS
  Filled 2012-01-30 (×3): qty 100

## 2012-01-30 MED ORDER — FUROSEMIDE 40 MG PO TABS
40.0000 mg | ORAL_TABLET | Freq: Every day | ORAL | Status: DC
Start: 2012-01-30 — End: 2012-01-30
  Filled 2012-01-30: qty 1

## 2012-01-30 MED ORDER — LORAZEPAM 2 MG/ML IJ SOLN: 0.5000 mg | Freq: Four times a day (QID) | INTRAMUSCULAR | Status: DC | PRN

## 2012-01-30 MED ORDER — ALTEPLASE 100 MG IV SOLR
2.0000 mg | Freq: Once | INTRAVENOUS | Status: AC
  Administered 2012-01-30: 2 mg
  Filled 2012-01-30: qty 2

## 2012-01-30 MED ORDER — CLINIMIX E/DEXTROSE (5/20) 5 % IV SOLN
INTRAVENOUS | Status: AC
  Filled 2012-01-30: qty 2000

## 2012-01-30 MED ORDER — POTASSIUM CHLORIDE CRYS ER 20 MEQ PO TBCR: 20.0000 meq | EXTENDED_RELEASE_TABLET | Freq: Every day | ORAL | Status: DC

## 2012-01-30 MED ORDER — POTASSIUM CHLORIDE CRYS ER 20 MEQ PO TBCR
40.0000 meq | EXTENDED_RELEASE_TABLET | Freq: Two times a day (BID) | ORAL | Status: DC
  Administered 2012-01-30: 40 meq via ORAL
  Filled 2012-01-30 (×2): qty 2

## 2012-01-30 MED ORDER — POTASSIUM CHLORIDE CRYS ER 20 MEQ PO TBCR
40.0000 meq | EXTENDED_RELEASE_TABLET | Freq: Every day | ORAL | Status: DC
  Administered 2012-01-31: 40 meq via ORAL
  Filled 2012-01-30 (×2): qty 2

## 2012-01-30 MED ORDER — FUROSEMIDE 10 MG/ML IJ SOLN
20.0000 mg | Freq: Every day | INTRAMUSCULAR | Status: DC
  Administered 2012-01-31 – 2012-02-01 (×2): 20 mg via INTRAVENOUS
  Filled 2012-01-30 (×4): qty 2

## 2012-01-30 MED ORDER — OXYCODONE-ACETAMINOPHEN 5-325 MG PO TABS
1.0000 | ORAL_TABLET | ORAL | Status: DC | PRN
  Administered 2012-01-31 – 2012-02-03 (×11): 2 via ORAL
  Administered 2012-02-03: 1 via ORAL
  Administered 2012-02-03 – 2012-02-13 (×29): 2 via ORAL
  Filled 2012-01-30 (×43): qty 2

## 2012-01-30 NOTE — Progress Notes (Signed)
Patient transferred to [address] west and to this CSW today.  PT is recommending CIR/Rehab for this patient and I have asked MD to order consult if he agrees. CSW to follow along and assist further as d/c disposition is considered.  Reece Levy, MSW, Theresia Majors [phone]

## 2012-01-30 NOTE — Progress Notes (Signed)
Subjective: Pt ok. Resting well Moved to floor and has been OOB Output has increased once pt OOB  Objective: Physical Exam: BP 146/92  Pulse 76  Temp 99 F (37.2 C) (Oral)  Resp 18  Ht 5\' 10"  (1.778 m)  Wt 279 lb 5.2 oz (126.7 kg)  BMI 40.08 kg/m2  SpO2 97% Drain intact, site clean Output remains purulent   Labs: CBC  Basename 01/30/12 0629 01/28/12 0325  WBC 7.6 7.5  HGB 10.9* 11.3*  HCT 32.6* 35.1*  PLT 494* 467*   BMET  Basename 01/30/12 0629 01/29/12 0350  NA 136 134*  K 3.1* 3.2*  CL 104 102  CO2 24 25  GLUCOSE 167* 150*  BUN 8 9  CREATININE 0.49* 0.51  CALCIUM 8.3* 7.8*   LFT  Basename 01/30/12 0629  PROT 6.0  ALBUMIN 1.8*  AST 49*  ALT 89*  ALKPHOS 70  BILITOT 0.2*  BILIDIR --  IBILI --  LIPASE --   PT/INR No results found for this basename: LABPROT:2,INR:2 in the last 72 hours   Studies/Results: Dg Chest Port 1 View  01/29/2012  *RADIOLOGY REPORT*  Clinical Data: Atelectasis  PORTABLE CHEST - 1 VIEW  Comparison: 01/26/2012  Findings: Right PICC stable.  Left sided central venous catheter removed.  Bibasilar atelectasis improved.  Low volumes persist.  No pneumothorax.  Upper normal heart size. Pigtail catheter projects over the left upper quadrant.  IMPRESSION: Improved bibasilar atelectasis.  Lungs remain under aerated.   Original Report Authenticated By: Donavan Burnet, M.D.     Assessment/Plan: S/p (L)sided peritoneal abscess drainage 10/8 Cont to follow Cont to monitor output and clinical course Repeat CT later this week.  LOS: 14 days    Brayton El PA-C 01/30/2012 10:15 AM

## 2012-01-30 NOTE — Progress Notes (Signed)
TPN stopped at 1800 and set to NS @ KVO for PICC. Will continue to monitor.

## 2012-01-30 NOTE — Progress Notes (Signed)
Physical Therapy Treatment Patient Details Name: [name] MRN: [id number] DOB: 12/16/1970 Today's Date: 01/30/2012 Time: 4098-1191 PT Time Calculation (min): 39 min  PT Assessment / Plan / Recommendation Comments on Treatment Session  Spouse and father present during session and very helpful.  Applied ABD binder while pt was supine. Demonstrated and instructed pt on "log roll" tech for supine to sit to min ABD pain. Amb pt twice then positioned in recliner.  Left RW in room for staff use and encouraged mobility.    Follow Up Recommendations        Does the patient have the potential to tolerate intense rehabilitation     Barriers to Discharge        Equipment Recommendations  Rolling walker with 5" wheels;3 in 1 bedside comode    Recommendations for Other Services    Frequency Min 3X/week   Plan Discharge plan needs to be updated;Other (comment) (depending on mobility progress and family support)    Precautions / Restrictions Precautions Precautions: Fall Precaution Comments: ABD binder/colostomy/L drain Restrictions Weight Bearing Restrictions: No    Pertinent Vitals/Pain C/o 5/10 ABD pain during session despite pre medication    Mobility  Bed Mobility Bed Mobility: Supine to Sit;Sitting - Scoot to [place] of Bed;Rolling Left Rolling Left: 3: Mod assist Supine to Sit: 2: Max assist Sitting - Scoot to [place] of Bed: 3: Mod assist Details for Bed Mobility Assistance: Instructed pt on log roll tech to minimize ABD pain during supine to roll L to scoot to EOB.  Transfers Transfers: Sit to Stand;Stand to Sit Sit to Stand: 3: Mod assist;4: Min assist;From elevated surface;From chair/3-in-1;From bed Stand to Sit: 4: Min assist;To chair/3-in-1 Details for Transfer Assistance: 50% VC's on proper tech to use B UE's more to decrease ABD discomfort.   Ambulation/Gait Ambulation/Gait Assistance: 1: +2 Total assist Ambulation/Gait: Patient Percentage: 90% Ambulation  Distance (Feet): 60 Feet (30' x 2) Assistive device: Rolling walker Ambulation/Gait Assistance Details: + 2 for safety and use of walker for increased balance and to decrease stress on ABD muscles. Pt required increased time.  Spouse and dad assisted by following with recliner and IV pole. Gait Pattern: Step-to pattern;Decreased stride length;Trunk flexed Gait velocity: decreased    PT Goals            progressing    Visit Information  Last PT Received On: 01/30/12 Assistance Needed: +2 (equipment/chair)    Subjective Data  Subjective: I feel better, but still hurt Patient Stated Goal: home   Cognition    good/improved   Balance   fair  End of Session PT - End of Session Equipment Utilized During Treatment: Gait belt;Other (comment) (ABD binder applied in supine ) Activity Tolerance: Patient tolerated treatment well  Felecia Shelling  PTA [facility]  Acute  Rehab Pager     [phone]

## 2012-01-30 NOTE — Progress Notes (Signed)
Patient interviewed and examined, agree with PA note above. Midline wound examined and had some facial dehiscence but is clean in stable. We will want to examine his stoma at the time of his next device change. Repeat CT scan to evaluate abscess drainage later this week. Mariella Saa MD, FACS  01/30/2012 11:30 AM

## 2012-01-30 NOTE — Consult Note (Signed)
Physical Medicine and Rehabilitation Consult Reason for Consult: Deconditioning Referring Physician:  Dr. Sharl Ma   HPI: [name] is a 41 y.o. male with history of morbid obesity, abdominal pain X 1 month; who was admitted on 01/16/12 with nausea and vomiting with xrays revealing stricture of descending colon with colonic obstruction. Started on IV antibiotics and underwent exploratory lap (revealing abscess) with partial colectomy and colostomy as well as placement of VAC on 10/01 by Dr. Luisa Hart.   Post op course complicated by hypoxic respiratory failure requiring intubation on 10/02. Patient with decrease LOC with encephalopathy and CT head without acute changes.  Persistent fevers noted and CT abdomen with left abdominal abscess and percutaneous drain placed by IVR on 10/07. Patient self extubated on 10/10. On TNA for nutritional support and due to ileus.  Has been tolerating liquids. Follow up CT abdomen with decrease in size of abscess. Patient has had wound dehiscence and stoma retraction that's being monitored. Therapies initiated and patient noted to be deconditioned. Therapy team recommending CIR for progression.   Review of Systems  Eyes: Negative for blurred vision and double vision.  Respiratory: Negative for shortness of breath.   Cardiovascular: Negative for chest pain and palpitations.  Gastrointestinal: Negative for heartburn, nausea and vomiting.  Musculoskeletal: Negative for myalgias and back pain.  Neurological: Negative for headaches.   Past Medical History  Diagnosis Date  . Urinary anastomotic stricture undiagnosed     hard to be cathed; urology did the last time  . Diverticulitis   . Colon obstruction 01/17/2012  . Obesity, Class III, BMI 40-49.9 (morbid obesity) 01/17/2012   Past Surgical History  Procedure Date  . No past surgeries   . Partial colectomy 01/17/2012    Procedure: PARTIAL COLECTOMY;  Surgeon: Clovis Pu. Cornett, MD;  Location: [facility];  Service:  General;  Laterality: N/A;  . 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 Cornett, MD;  Location: [facility];  Service: General;  Laterality: N/A;   History reviewed. No pertinent family history.  Social History: Married.  Works as a Naval architect. He  reports that he has been smoking Cigarettes-2 PPD.  He has a 40 pack-year smoking history. He has never used smokeless tobacco. He reports that he does not drink alcohol or use illicit drugs. Wife a homemaker.   Allergies: No Known Allergies   Medications Prior to Admission  Medication Sig Dispense Refill  . cyclobenzaprine (FLEXERIL) 10 MG tablet Take 1 tablet (10 mg total) by mouth 2 (two) times daily as needed for muscle spasms.  20 tablet  0  . HYDROcodone-acetaminophen (NORCO/VICODIN) 5-325 MG per tablet Take 1 tablet by mouth every 6 (six) hours as needed. For pain.      Marland Kitchen HYDROcodone-acetaminophen (VICODIN) 5-500 MG per tablet Take 1-2 tablets by mouth every 6 (six) hours as needed for pain.  15 tablet  0  . naproxen sodium (ANAPROX) 220 MG tablet Take 440 mg by mouth 2 (two) times daily with a meal.        Home: Home Living Lives With: Spouse Available Help at Discharge: Family Type of Home: House Home Layout: One level Bathroom Shower/Tub: Engineer, manufacturing systems: Standard Home Adaptive Equipment: None Additional Comments: pt was loing distance truck driver.  Functional History: Prior Function Able to Take Stairs?: Yes Driving: Yes Vocation: Full time employment Functional Status:  Mobility: Bed Mobility Bed Mobility: Rolling Right Rolling Right: 1: +2 Total assist Rolling Right: Patient Percentage: 20% Rolling  Left: 1: +2 Total assist;With rail Rolling Left: Patient Percentage: 10% Transfers Transfers: Not assessed Ambulation/Gait Ambulation/Gait Assistance: Not tested (comment) Wheelchair Mobility Wheelchair Mobility: No  ADL: ADL Grooming: Simulated;Maximal assistance Where  Assessed - Grooming: Supported sitting ADL Comments: Pt found slumped/slouched in chair upon arrival. Flat affect. Pt extremely weak and deconditioned.  Cognition: Cognition Arousal/Alertness: Awake/alert Orientation Level: Oriented X4 Cognition Overall Cognitive Status: Appears within functional limits for tasks assessed/performed Area of Impairment: Attention;Memory;Following commands;Safety/judgement;Awareness of deficits Arousal/Alertness: Awake/alert Orientation Level: Disoriented to;Place;Time;Situation Behavior During Session: Restless Current Attention Level: Alternating Following Commands: Follows one step commands inconsistently;Follows one step commands with increased time Safety/Judgement: Decreased awareness of safety precautions;Impulsive;Decreased awareness of need for assistance  Blood pressure 135/87, pulse 99, temperature 97.8 F (36.6 C), temperature source Oral, resp. rate 18, height 5\' 10"  (1.778 m), weight 126.7 kg (279 lb 5.2 oz), SpO2 97.00%. Physical Exam  Nursing note and vitals reviewed. Constitutional: He is oriented to person, place, and time. He appears well-developed and well-nourished.       Morbidly obese  HENT:  Head: Normocephalic and atraumatic.  Eyes: Pupils are equal, round, and reactive to light.  Neck: Normal range of motion.  Cardiovascular: Normal rate and regular rhythm.   Pulmonary/Chest: Effort normal and breath sounds normal.  Abdominal: Soft. He exhibits no distension. Bowel sounds are decreased.       Dry dressing over the entirety of the stomach. Ostomy site intact.  Percutaneous drain with brownish material.   Musculoskeletal: He exhibits no edema and no tenderness.  Neurological: He is alert and oriented to person, place, and time.       Strength grossly 3-4/5. Mild sensory loss distally over both feet to LT and Pain  Skin: Skin is warm and dry.  Psychiatric: He has a normal mood and affect. His behavior is normal. Judgment and  thought content normal.    GLUCOSE, CAPILLARY     Status: Abnormal   Collection Time   01/30/12  4:38 AM      Component Value Range   Glucose-Capillary 171 (*) 70 - 99 mg/dL  COMPREHENSIVE METABOLIC PANEL     Status: Abnormal   Collection Time   01/30/12  6:29 AM      Component Value Range   Sodium 136  135 - 145 mEq/L   Potassium 3.1 (*) 3.5 - 5.1 mEq/L   Chloride 104  96 - 112 mEq/L   CO2 24  19 - 32 mEq/L   Glucose, Bld 167 (*) 70 - 99 mg/dL   BUN 8  6 - 23 mg/dL   Creatinine, Ser 1.61 (*) 0.50 - 1.35 mg/dL   Calcium 8.3 (*) 8.4 - 10.5 mg/dL   Total Protein 6.0  6.0 - 8.3 g/dL   Albumin 1.8 (*) 3.5 - 5.2 g/dL   AST 49 (*) 0 - 37 U/L   ALT 89 (*) 0 - 53 U/L   Alkaline Phosphatase 70  39 - 117 U/L   Total Bilirubin 0.2 (*) 0.3 - 1.2 mg/dL   GFR calc non Af Amer >90  >90 mL/min   GFR calc Af Amer >90  >90 mL/min  MAGNESIUM     Status: Normal   Collection Time   01/30/12  6:29 AM      Component Value Range   Magnesium 2.3  1.5 - 2.5 mg/dL  PHOSPHORUS     Status: Normal   Collection Time   01/30/12  6:29 AM      Component Value  Range   Phosphorus 2.8  2.3 - 4.6 mg/dL  CBC     Status: Abnormal   Collection Time   01/30/12  6:29 AM      Component Value Range   WBC 7.6  4.0 - 10.5 K/uL   RBC 3.56 (*) 4.22 - 5.81 MIL/uL   Hemoglobin 10.9 (*) 13.0 - 17.0 g/dL   HCT 95.6 (*) 21.3 - 08.6 %   MCV 91.6  78.0 - 100.0 fL   MCH 30.6  26.0 - 34.0 pg   MCHC 33.4  30.0 - 36.0 g/dL   RDW 57.8  46.9 - 62.9 %   Platelets 494 (*) 150 - 400 K/uL  DIFFERENTIAL     Status: Abnormal   Collection Time   01/30/12  6:29 AM      Component Value Range   Neutrophils Relative 61  43 - 77 %   Lymphocytes Relative 20  12 - 46 %   Monocytes Relative 15 (*) 3 - 12 %   Eosinophils Relative 3  0 - 5 %   Basophils Relative 1  0 - 1 %   Neutro Abs 4.7  1.7 - 7.7 K/uL   Lymphs Abs 1.5  0.7 - 4.0 K/uL   Monocytes Absolute 1.1 (*) 0.1 - 1.0 K/uL   Eosinophils Absolute 0.2  0.0 - 0.7 K/uL    Basophils Absolute 0.1  0.0 - 0.1 K/uL   WBC Morphology MILD LEFT SHIFT (1-5% METAS, OCC MYELO, OCC BANDS)    CHOLESTEROL, TOTAL     Status: Normal   Collection Time   01/30/12  6:29 AM      Component Value Range   Cholesterol 137  0 - 200 mg/dL  TRIGLYCERIDES     Status: Abnormal   Collection Time   01/30/12  6:29 AM      Component Value Range   Triglycerides 280 (*) <150 mg/dL  GLUCOSE, CAPILLARY     Status: Abnormal   Collection Time   01/30/12  7:35 AM      Component Value Range   Glucose-Capillary 145 (*) 70 - 99 mg/dL   Dg Chest Port 1 View  01/29/2012  *RADIOLOGY REPORT*  Clinical Data: Atelectasis  PORTABLE CHEST - 1 VIEW  Comparison: 01/26/2012  Findings: Right PICC stable.  Left sided central venous catheter removed.  Bibasilar atelectasis improved.  Low volumes persist.  No pneumothorax.  Upper normal heart size. Pigtail catheter projects over the left upper quadrant.  IMPRESSION: Improved bibasilar atelectasis.  Lungs remain under aerated.   Original Report Authenticated By: Donavan Burnet, M.D.     Assessment/Plan: Diagnosis: severe deconditioning after SBO, colectomy/ morbidly obese 1. Does the need for close, 24 hr/day medical supervision in concert with the patient's rehab needs make it unreasonable for this patient to be served in a less intensive setting? Yes 2. Co-Morbidities requiring supervision/potential complications: acute respiratory failure, multiple medical 3. Due to bladder management, bowel management, safety, skin/wound care, disease management, medication administration, pain management and patient education, does the patient require 24 hr/day rehab nursing? Yes 4. Does the patient require coordinated care of a physician, rehab nurse, PT (1-2 hrs/day, 5 days/week) and OT (1-2 hrs/day, 5 days/week) to address physical and functional deficits in the context of the above medical diagnosis(es)? Yes Addressing deficits in the following areas: balance, endurance,  locomotion, strength, transferring, bowel/bladder control, bathing, dressing, feeding, grooming, toileting and psychosocial support 5. Can the patient actively participate in an intensive therapy program of  at least 3 hrs of therapy per day at least 5 days per week? Potentially 6. The potential for patient to make measurable gains while on inpatient rehab is good 7. Anticipated functional outcomes upon discharge from inpatient rehab are supervision to min assist with PT, supervision to min+ asist with OT, n/a with SLP. 8. Estimated rehab length of stay to reach the above functional goals is: 2-3 weeks 9. Does the patient have adequate social supports to accommodate these discharge functional goals? Yes 10. Anticipated D/C setting: Home 11. Anticipated post D/C treatments: HH therapy 12. Overall Rehab/Functional Prognosis: good  RECOMMENDATIONS: This patient's condition is appropriate for continued rehabilitative care in the following setting: CIR Patient has agreed to participate in recommended program. Yes Note that insurance prior authorization may be required for reimbursement for recommended care.  Comment: Eventual CIR candidate once stamina improves and surgery feels that he is ready from their standpoint. Rehab RN to follow up.   Ivory Broad, MD     01/30/2012

## 2012-01-30 NOTE — Progress Notes (Signed)
Subjective: Patient seen and examined, no new complaints. Denies shortness of breath. Patient is s/p VDRF after he had perforated bowel due to stricture, peritonitis, s/p exploratory laparotomy.The post op course was complicated by hypoxemic respiratory failure requiring intubation. Patient is stable  His o2 sats are 99% on RA.  Objective: Vital signs in last 24 hours: Temp:  [97.8 F (36.6 C)-99.7 F (37.6 C)] 97.8 F (36.6 C) (10/14 1000) Pulse Rate:  [76-99] 99  (10/14 1000) Resp:  [18-28] 18  (10/14 1000) BP: (119-148)/(78-92) 135/87 mmHg (10/14 1000) SpO2:  [96 %-97 %] 97 % (10/14 1000) Weight:  [126.7 kg (279 lb 5.2 oz)] 126.7 kg (279 lb 5.2 oz) (10/14 0630) Weight change: -0.7 kg (-1 lb 8.7 oz) Last BM Date: 01/29/12  Consults: Surgery CCM     Intake/Output from previous day: 10/13 0701 - 10/14 0700 In: 3579.3 [P.O.:720; I.V.:470; IV Piggyback:752; TPN:1617.3] Out: 5040 [Urine:4100; Drains:540; Stool:400] Total I/O In: 80 [P.O.:80] Out: 800 [Urine:700; Stool:100]   Physical Exam: Head: Normocephalic, atraumatic.  Eyes: No signs of jaundice, EOMI Nose: Mucous membranes dry.  Neck: supple,No deformities, masses, or tenderness noted. Lungs: Normal respiratory effort. B/L Clear to auscultation, no crackles or wheezes.  Heart: Regular RR. S1 and S2 normal  Abdomen: BS normoactive. Soft, Nondistended, non-tender.  Extremities: No pretibial edema, no erythema   Lab Results: Basic Metabolic Panel:  Basename 01/30/12 0629 01/29/12 0350  NA 136 134*  K 3.1* 3.2*  CL 104 102  CO2 24 25  GLUCOSE 167* 150*  BUN 8 9  CREATININE 0.49* 0.51  CALCIUM 8.3* 7.8*  MG 2.3 2.2  PHOS 2.8 2.5   Liver Function Tests:  [facility] 01/30/12 0629  AST 49*  ALT 89*  ALKPHOS 70  BILITOT 0.2*  PROT 6.0  ALBUMIN 1.8*   No results found for this basename: LIPASE:2,AMYLASE:2 in the last 72 hours No results found for this basename: AMMONIA:2 in the last 72  hours CBC:  Basename 01/30/12 0629 01/28/12 0325  WBC 7.6 7.5  NEUTROABS 4.7 --  HGB 10.9* 11.3*  HCT 32.6* 35.1*  MCV 91.6 93.9  PLT 494* 467*   C  Basename 01/30/12 0735 01/30/12 0438 01/29/12 2344 01/29/12 1949 01/29/12 1654 01/29/12 1216  GLUCAP 145* 171* 147* 130* 232* 112*   Hemoglobin A1C: No results found for this basename: HGBA1C in the last 72 hours Fasting Lipid Panel:  Basename 01/30/12 0629  CHOL 137  HDL --  LDLCALC --  TRIG 280*  CHOLHDL --  LDLDIRECT --    Recent Results (from the past 240 hour(s))  CULTURE, ROUTINE-ABSCESS     Status: Normal   Collection Time   01/24/12  1:10 PM      Component Value Range Status Comment   Specimen Description PERITONEAL CAVITY   Final    Special Requests NONE   Final    Gram Stain     Final    Value: FEW WBC PRESENT, PREDOMINANTLY PMN     NO SQUAMOUS EPITHELIAL CELLS SEEN     ABUNDANT GRAM NEGATIVE RODS     ABUNDANT GRAM POSITIVE RODS     FEW GRAM VARIABLE ROD   Culture ABUNDANT ENTEROCOCCUS SPECIES   Final    Report Status 01/27/2012 FINAL   Final    Organism ID, Bacteria ENTEROCOCCUS SPECIES   Final   CULTURE, ROUTINE-ABSCESS     Status: Normal   Collection Time   01/24/12  2:07 PM      Component Value Range  Status Comment   Specimen Description PERITONEAL CAVITY   Final    Special Requests NONE   Final    Gram Stain     Final    Value: RARE WBC PRESENT, PREDOMINANTLY PMN     NO SQUAMOUS EPITHELIAL CELLS SEEN     ABUNDANT GRAM POSITIVE RODS     MODERATE GRAM NEGATIVE RODS     MODERATE GRAM VARIABLE ROD   Culture ABUNDANT ENTEROCOCCUS SPECIES   Final    Report Status 01/27/2012 FINAL   Final    Organism ID, Bacteria ENTEROCOCCUS SPECIES   Final     Studies/Results: Dg Chest Port 1 View  01/29/2012  *RADIOLOGY REPORT*  Clinical Data: Atelectasis  PORTABLE CHEST - 1 VIEW  Comparison: 01/26/2012  Findings: Right PICC stable.  Left sided central venous catheter removed.  Bibasilar atelectasis improved.  Low  volumes persist.  No pneumothorax.  Upper normal heart size. Pigtail catheter projects over the left upper quadrant.  IMPRESSION: Improved bibasilar atelectasis.  Lungs remain under aerated.   Original Report Authenticated By: Donavan Burnet, M.D.     Medications: Scheduled Meds:   . alteplase  2 mg Intracatheter Once  . antiseptic oral rinse  15 mL Mouth Rinse q12n4p  . chlorhexidine  15 mL Mouth Rinse BID  . enoxaparin  60 mg Subcutaneous Q24H  . furosemide  20 mg Intravenous Daily  . metoprolol  5 mg Intravenous Q6H  . micafungin (MYCAMINE) IV  100 mg Intravenous Daily  . pantoprazole (PROTONIX) IV  40 mg Intravenous QHS  . potassium chloride  40 mEq Oral BID  . sodium chloride  10-40 mL Intracatheter Q12H  . vancomycin  2,000 mg Intravenous Q12H  . DISCONTD: insulin aspart  0-20 Units Subcutaneous Q4H  . DISCONTD: insulin glargine  15 Units Subcutaneous QHS   Continuous Infusions:   . dextrose 60 mL/hr at 01/29/12 1130  . TPN (CLINIMIX) +/- additives 60 mL/hr at 01/28/12 1728  . TPN (CLINIMIX) +/- additives    . DISCONTD: TPN (CLINIMIX) +/- additives 80 mL/hr at 01/29/12 1808   PRN Meds:.acetaminophen (TYLENOL) oral liquid 160 mg/5 mL, albuterol-ipratropium, alum & mag hydroxide-simeth, fentaNYL, hydrALAZINE, LORazepam, magic mouthwash, ondansetron, oxyCODONE-acetaminophen, sodium chloride    Principal Problem:  *Colon obstruction Active Problems:  UTI (lower urinary tract infection)  Diverticulitis of colon without hemorrhage  Obesity, Class III, BMI 40-49.9 (morbid obesity)  Acute respiratory failure with hypoxia  Acute pulmonary edema  Encephalopathy acute  Tobacco abuse  Intra-abdominal abscess, diverticular s/p surgical drainage  Hypertriglyceridemia  Intraabdominal abscess, s/ surgical drainage Culture from the abscess is growing enterococcus, sensitive to vancomycin. Peritoneal drain in place, surgery following.  S/P VDRF, secondary to pulmonary  edema Stable, currently on IV lasix, which should be continued as he is on TNA. May change to po lasix once he is able to take by mouth.  Hypokalemia Replace potassium.  Will transfer to surgical service, d/w Dr Johna Sheriff    LOS: 14 days  Assessment/Plan: [facility] Pager: [phone] 01/30/2012, 11:37 AM

## 2012-01-30 NOTE — Progress Notes (Signed)
Patient ID: [name], male   DOB: Jun 12, 1970, 41 y.o.   MRN: [id number] 13 Days Post-Op  Subjective: Pt had a rough night secondary to anxiety.  Otherwise, he is tolerating full liquids very well.  When he stands up he has an increase in purulent drainage into drain.  Objective: Vital signs in last 24 hours: Temp:  [97.9 F (36.6 C)-99.7 F (37.6 C)] 99 F (37.2 C) (10/14 0440) Pulse Rate:  [76-99] 76  (10/14 0440) Resp:  [18-33] 18  (10/14 0440) BP: (119-148)/(78-92) 146/92 mmHg (10/14 0440) SpO2:  [96 %-97 %] 97 % (10/14 0440) Weight:  [279 lb 5.2 oz (126.7 kg)] 279 lb 5.2 oz (126.7 kg) (10/14 0630) Last BM Date: 01/29/12  Intake/Output from previous day: 10/13 0701 - 10/14 0700 In: 3579.3 [P.O.:720; I.V.:470; IV Piggyback:752; TPN:1617.3] Out: 5040 [Urine:4100; Drains:540; Stool:400] Intake/Output this shift:    PE: Abd: soft, +BS, ostomy with severely retracted stoma, but still with quite a bit of stool.  Wound is clean, some dehiscence but no evisceration.  Drain with dark brown purulent appearing drainage  Lab Results:   Basename 01/30/12 0629 01/28/12 0325  WBC 7.6 7.5  HGB 10.9* 11.3*  HCT 32.6* 35.1*  PLT 494* 467*   BMET  Basename 01/30/12 0629 01/29/12 0350  NA 136 134*  K 3.1* 3.2*  CL 104 102  CO2 24 25  GLUCOSE 167* 150*  BUN 8 9  CREATININE 0.49* 0.51  CALCIUM 8.3* 7.8*   PT/INR No results found for this basename: LABPROT:2,INR:2 in the last 72 hours CMP     Component Value Date/Time   NA 136 01/30/2012 0629   K 3.1* 01/30/2012 0629   CL 104 01/30/2012 0629   CO2 24 01/30/2012 0629   GLUCOSE 167* 01/30/2012 0629   BUN 8 01/30/2012 0629   CREATININE 0.49* 01/30/2012 0629   CALCIUM 8.3* 01/30/2012 0629   PROT 6.0 01/30/2012 0629   ALBUMIN 1.8* 01/30/2012 0629   AST 49* 01/30/2012 0629   ALT 89* 01/30/2012 0629   ALKPHOS 70 01/30/2012 0629   BILITOT 0.2* 01/30/2012 0629   GFRNONAA >90 01/30/2012 0629   GFRAA >90 01/30/2012 0629    Lipase     Component Value Date/Time   LIPASE 24 01/16/2012 1210       Studies/Results: Dg Chest Port 1 View  01/29/2012  *RADIOLOGY REPORT*  Clinical Data: Atelectasis  PORTABLE CHEST - 1 VIEW  Comparison: 01/26/2012  Findings: Right PICC stable.  Left sided central venous catheter removed.  Bibasilar atelectasis improved.  Low volumes persist.  No pneumothorax.  Upper normal heart size. Pigtail catheter projects over the left upper quadrant.  IMPRESSION: Improved bibasilar atelectasis.  Lungs remain under aerated.   Original Report Authenticated By: Donavan Burnet, M.D.     Anti-infectives: Anti-infectives     Start     Dose/Rate Route Frequency Ordered Stop   01/28/12 2300   vancomycin (VANCOCIN) 2,000 mg in sodium chloride 0.9 % 500 mL IVPB  Status:  Discontinued        2,000 mg 250 mL/hr over 120 Minutes Intravenous Every 12 hours 01/27/12 2239 01/27/12 2243   01/28/12 1200   vancomycin (VANCOCIN) 2,000 mg in sodium chloride 0.9 % 500 mL IVPB  Status:  Discontinued        2,000 mg 250 mL/hr over 120 Minutes Intravenous Every 12 hours 01/27/12 2233 01/27/12 2238   01/27/12 2300   vancomycin (VANCOCIN) 750 mg in sodium chloride 0.9 % 150  mL IVPB  Status:  Discontinued        750 mg 150 mL/hr over 60 Minutes Intravenous  Once 01/27/12 2232 01/27/12 2233   01/27/12 2300   vancomycin (VANCOCIN) 2,000 mg in sodium chloride 0.9 % 500 mL IVPB        2,000 mg 250 mL/hr over 120 Minutes Intravenous Every 12 hours 01/27/12 2244     01/25/12 1045   vancomycin (VANCOCIN) 1,250 mg in sodium chloride 0.9 % 250 mL IVPB  Status:  Discontinued        1,250 mg 166.7 mL/hr over 90 Minutes Intravenous 2 times daily 01/25/12 1020 01/27/12 2237   01/23/12 1300   micafungin (MYCAMINE) 100 mg in sodium chloride 0.9 % 100 mL IVPB        100 mg 100 mL/hr over 1 Hours Intravenous Daily 01/23/12 1158 01/30/12 2359   01/16/12 1800  piperacillin-tazobactam (ZOSYN) IVPB 3.375 g       3.375  g 12.5 mL/hr over 240 Minutes Intravenous 3 times per day 01/16/12 1721 01/27/12 1012           Assessment/Plan  1. S/p ex lap with Hartman's for perf tics 2. VDRF, resolved 3. Hyperglycemia, secondary to TNA 4. PCM/TNA 5. Hypokalemia 6. Deconditioning  Plan: 1. Cont all IV abx therapy 2. Will need repeat CT scan later this week. 3. Will need to closely monitor stoma to make sure it does not continue to retract into the abdomen.   4. Cont dressing changes TID for now 5. Replace K 6. Wean TNA to off after this bag secondary to diet advancement. 7. Dc foley   LOS: 14 days    Julianny Milstein E 01/30/2012, 8:50 AM Pager: [phone]

## 2012-01-31 LAB — BASIC METABOLIC PANEL
BUN: 8 mg/dL (ref 6–23)
Chloride: 104 mEq/L (ref 96–112)
Creatinine, Ser: 0.7 mg/dL (ref 0.50–1.35)
GFR calc Af Amer: >90 mL/min (ref >90)
Glucose, Bld: 120 mg/dL — ABNORMAL HIGH (ref 70–99)

## 2012-01-31 MED ORDER — CIPROFLOXACIN HCL 500 MG PO TABS
500.0000 mg | ORAL_TABLET | Freq: Two times a day (BID) | ORAL | Status: DC
  Administered 2012-01-31 (×2): 500 mg via ORAL
  Filled 2012-01-31 (×5): qty 1

## 2012-01-31 MED ORDER — POTASSIUM CHLORIDE CRYS ER 20 MEQ PO TBCR
40.0000 meq | EXTENDED_RELEASE_TABLET | Freq: Two times a day (BID) | ORAL | Status: AC
  Administered 2012-01-31 – 2012-02-01 (×2): 40 meq via ORAL
  Filled 2012-01-31 (×2): qty 2

## 2012-01-31 NOTE — Progress Notes (Signed)
Patient ID: [name], male   DOB: 12-Sep-1970, 41 y.o.   MRN: [id number] 14 Days Post-Op  Subjective: Pt feels better.  Eating some clear liquids, by choice.  Trying to mobilize more. Evaluated by CIR who has accepted him when he is medically cleared.    Objective: Vital signs in last 24 hours: Temp:  [97.8 F (36.6 C)-99.9 F (37.7 C)] 98.9 F (37.2 C) (10/15 0532) Pulse Rate:  [78-99] 92  (10/15 0532) Resp:  [18-20] 18  (10/15 0532) BP: (111-153)/(77-88) 114/78 mmHg (10/15 0532) SpO2:  [97 %-99 %] 97 % (10/15 0532) Weight:  [88 lb 13.5 oz (40.3 kg)-280 lb 10.3 oz (127.3 kg)] 88 lb 13.5 oz (40.3 kg) (10/15 0500) Last BM Date: 01/31/12  Intake/Output from previous day: 10/14 0701 - 10/15 0700 In: 3265 [P.O.:1040; IV Piggyback:1500; TPN:440] Out: 2205 [Urine:1600; Drains:230; Stool:375] Intake/Output this shift: Total I/O In: 20 [I.V.:20] Out: -   PE: Abd: soft, still quite tender, wound is mostly clean, sweet smell and some green-blue drainage c/w pseudomonas.  Ostomy with air and stool.  Drain with brown cloudy output.  Lab Results:   [facility] 01/30/12 0629  WBC 7.6  HGB 10.9*  HCT 32.6*  PLT 494*   BMET  Basename 01/31/12 0452 01/30/12 0629  NA 136 136  K 3.4* 3.1*  CL 104 104  CO2 23 24  GLUCOSE 120* 167*  BUN 8 8  CREATININE 0.70 0.49*  CALCIUM 8.1* 8.3*   PT/INR No results found for this basename: LABPROT:2,INR:2 in the last 72 hours CMP     Component Value Date/Time   NA 136 01/31/2012 0452   K 3.4* 01/31/2012 0452   CL 104 01/31/2012 0452   CO2 23 01/31/2012 0452   GLUCOSE 120* 01/31/2012 0452   BUN 8 01/31/2012 0452   CREATININE 0.70 01/31/2012 0452   CALCIUM 8.1* 01/31/2012 0452   PROT 6.0 01/30/2012 0629   ALBUMIN 1.8* 01/30/2012 0629   AST 49* 01/30/2012 0629   ALT 89* 01/30/2012 0629   ALKPHOS 70 01/30/2012 0629   BILITOT 0.2* 01/30/2012 0629   GFRNONAA >90 01/31/2012 0452   GFRAA >90 01/31/2012 0452   Lipase     Component  Value Date/Time   LIPASE 24 01/16/2012 1210       Studies/Results: No results found.  Anti-infectives: Anti-infectives     Start     Dose/Rate Route Frequency Ordered Stop   01/31/12 1000   ciprofloxacin (CIPRO) tablet 500 mg        500 mg Oral 2 times daily 01/31/12 0950     01/28/12 2300   vancomycin (VANCOCIN) 2,000 mg in sodium chloride 0.9 % 500 mL IVPB  Status:  Discontinued        2,000 mg 250 mL/hr over 120 Minutes Intravenous Every 12 hours 01/27/12 2239 01/27/12 2243   01/28/12 1200   vancomycin (VANCOCIN) 2,000 mg in sodium chloride 0.9 % 500 mL IVPB  Status:  Discontinued        2,000 mg 250 mL/hr over 120 Minutes Intravenous Every 12 hours 01/27/12 2233 01/27/12 2238   01/27/12 2300   vancomycin (VANCOCIN) 750 mg in sodium chloride 0.9 % 150 mL IVPB  Status:  Discontinued        750 mg 150 mL/hr over 60 Minutes Intravenous  Once 01/27/12 2232 01/27/12 2233   01/27/12 2300   vancomycin (VANCOCIN) 2,000 mg in sodium chloride 0.9 % 500 mL IVPB        2,000  mg 250 mL/hr over 120 Minutes Intravenous Every 12 hours 01/27/12 2244     01/25/12 1045   vancomycin (VANCOCIN) 1,250 mg in sodium chloride 0.9 % 250 mL IVPB  Status:  Discontinued        1,250 mg 166.7 mL/hr over 90 Minutes Intravenous 2 times daily 01/25/12 1020 01/27/12 2237   01/23/12 1300   micafungin (MYCAMINE) 100 mg in sodium chloride 0.9 % 100 mL IVPB        100 mg 100 mL/hr over 1 Hours Intravenous Daily 01/23/12 1158 01/30/12 1226   01/16/12 1800  piperacillin-tazobactam (ZOSYN) IVPB 3.375 g       3.375 g 12.5 mL/hr over 240 Minutes Intravenous 3 times per day 01/16/12 1721 01/27/12 1012           Assessment/Plan  1. S/p Hartman's for perf tics 2. Sepsis, resolving 3. Intra-abdominal abscess, s/p perc drain 4. Retracted stoma 5. Wound dehiscence with no evisceration 6. Deconditioning 7. Hypokalemia  Plan: 1. Wound likely has pseudomonas present, will add cipro to cover this.  2.  Patient is only on Vancomycin for enterococcus that was from his original surgery.  It does not appear his drain contents were sent for culture last week.  Will send these for culture and will likely need to add some type of broader coverage such as zosyn. 3. CIR when patient is stable 4. Will need to follow the stoma to evaluate for further retraction 5. Repeat CT scan in 1-2 days 6. Replace K   LOS: 15 days    Avraham Benish E 01/31/2012, 9:51 AM Pager: [phone]

## 2012-01-31 NOTE — Progress Notes (Signed)
Occupational Therapy Treatment and Goal Update Patient Details Name: [name] MRN: [id number] DOB: [birth year] Today's Date: 01/31/2012 Time: 4098-1191 OT Time Calculation (min): 31 min  OT Assessment / Plan / Recommendation Comments on Treatment Session Pt making good progress with therapy.  Goals updated today    Follow Up Recommendations  Inpatient Rehab    Barriers to Discharge       Equipment Recommendations  Rolling walker with 5" wheels;3 in 1 bedside comode    Recommendations for Other Services    Frequency Min 2X/week   Plan Discharge plan remains appropriate    Precautions / Restrictions Precautions Precautions: Fall Precaution Comments: ABD binder/colostomt/L drain  Restrictions Weight Bearing Restrictions: No   Pertinent Vitals/Pain No c/o pain    ADL  Grooming: Performed;Set up;Teeth care Where Assessed - Grooming: Unsupported sitting Toilet Transfer: Simulated;Minimal assistance (bed to chair) Transfers/Ambulation Related to ADLs: Pt fatiqued:  wanted to perform grooming at EOB and save energy for ambulating    OT Diagnosis:    OT Problem List:   OT Treatment Interventions:     OT Goals Acute Rehab OT Goals Time For Goal Achievement: 02/09/12 ADL Goals Pt Will Perform Grooming: with supervision;Standing at sink (x 2 tasks for endurance) ADL Goal: Grooming - Progress: Updated due to goal met Pt Will Perform Lower Body Bathing: with min assist;Sit to stand from chair;with adaptive equipment ADL Goal: Lower Body Bathing - Progress: Goal set today Pt Will Perform Lower Body Dressing: with min assist;Sit to stand from bed;with adaptive equipment ADL Goal: Lower Body Dressing - Progress: Goal set today Pt Will Transfer to Toilet: with min assist;Ambulation;3-in-1 (min guard) ADL Goal: Toilet Transfer - Progress: Updated due to goal met Additional ADL Goal #1: Pt will complete supine>sit with mod A in prep for ADLs. ADL Goal: Additional Goal #1 -  Progress: Met Additional ADL Goal #2: Pt will tolerate sitting EOB unsupported with minguard A x 8 min in prep for seated ADL. ADL Goal: Additional Goal #2 - Progress: Met Miscellaneous OT Goals Miscellaneous OT Goal #1: Pt will tolerate 20-30 min of TA with minimal RBs in prep for ADLs. OT Goal: Miscellaneous Goal #1 - Progress: Met  Visit Information  Last OT Received On: 01/31/12 Assistance Needed: +1 (family with chair) PT/OT Co-Evaluation/Treatment: Yes    Subjective Data      Prior Functioning       Cognition  Overall Cognitive Status: Appears within functional limits for tasks assessed/performed Area of Impairment: Safety Arousal/Alertness: Awake/alert Behavior During Session: [facility] for tasks performed    Mobility  Shoulder Instructions Bed Mobility Bed Mobility: Left Sidelying to Sit Rolling Left: 3: Mod assist Left Sidelying to Sit: 3: Mod assist;HOB elevated Supine to Sit: 3: Mod assist (HOB raised, vcs ) Sitting - Scoot to Edge of Bed: 4: Min assist Details for Bed Mobility Assistance: vcs Transfers Sit to Stand: 4: Min assist;From bed;From chair/3-in-1;With upper extremity assist Stand to Sit: To chair/3-in-1;With upper extremity assist Details for Transfer Assistance: vcs for hand placement       Exercises      Balance     End of Session OT - End of Session Activity Tolerance: Patient limited by fatigue Patient left: in chair;with call bell/phone within reach  GO     Toree Edling 01/31/2012, 1:35 PM Marica Otter, OTR/L [phone] 01/31/2012

## 2012-01-31 NOTE — Progress Notes (Signed)
ANTIBIOTIC CONSULT NOTE - FOLLOW UP  Pharmacy Consult for Vancomycin Indication: sepsis  No Known Allergies  Patient Measurements: Height: 5\' 10"  (177.8 cm) Weight: 88 lb 13.5 oz (40.3 kg) IBW/kg (Calculated) : 73   Vital Signs: Temp: 98.9 F (37.2 C) (10/15 0532) Temp src: Oral (10/15 0532) BP: 114/78 mmHg (10/15 0532) Pulse Rate: 92  (10/15 0532) Intake/Output from previous day: 10/14 0701 - 10/15 0700 In: 3265 [P.O.:1040; IV Piggyback:1500; TPN:440] Out: 2205 [Urine:1600; Drains:230; Stool:375] Intake/Output from this shift: Total I/O In: 20 [I.V.:20] Out: 300 [Drains:300]  Labs:  [facility] 01/31/12 0452 01/30/12 0629 01/29/12 0350  WBC -- 7.6 --  HGB -- 10.9* --  PLT -- 494* --  LABCREA -- -- --  CREATININE 0.70 0.49* 0.51   Estimated Creatinine Clearance: 70 ml/min (by C-G formula based on Cr of 0.7).   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 CT on 10/7 with large fluid collection, percutaneous drain placed 10/8.  Patient transferred back to floor 10/13.  D#7 Vanc 2000 mg IV BID for abundant enterococcus (amp resist) that grew on peritoneal fluid culture. Per CCM note, will need 14 days min of vanc.   Vancomycin dose adjusted 10/11 following low VT of 6.8 on 1250 mg BID dosing.  Will reorder a VT for tomorrow AM to evaluate new dose.  Trough ordered yesterday was canceled when patient's line clotted requiring alteplase intervention by IV Team, delaying vancomycin dose by 5 hours as well.  Cipro added by surgery, appears drain contents were not sent for cx last week and surgery noting sweet smell and some green-blue  drainage c/w pseudomonas.  Goal of Therapy:  Vancomycin trough level 15-20 mcg/ml  Plan:  Continue Vancomycin 2g IV q12h.  Vancomycin trough tomorrow AM.  Clance Boll 01/31/2012,12:44 PM

## 2012-01-31 NOTE — Progress Notes (Signed)
14 Days Post-Op  Subjective: Pt doing better; has ambulated; no nausea or vomiting; tol liquid diet  Objective: Vital signs in last 24 hours: Temp:  [97.8 F (36.6 C)-99.9 F (37.7 C)] 98.9 F (37.2 C) (10/15 0532) Pulse Rate:  [78-99] 92  (10/15 0532) Resp:  [18-20] 18  (10/15 0532) BP: (111-153)/(77-88) 114/78 mmHg (10/15 0532) SpO2:  [97 %-99 %] 97 % (10/15 0532) Weight:  [88 lb 13.5 oz (40.3 kg)-280 lb 10.3 oz (127.3 kg)] 88 lb 13.5 oz (40.3 kg) (10/15 0500) Last BM Date: 01/31/12  Intake/Output from previous day: 10/14 0701 - 10/15 0700 In: 3265 [P.O.:1040; IV Piggyback:1500; TPN:440] Out: 2205 [Urine:1600; Drains:230; Stool:375] Intake/Output this shift: Total I/O In: 20 [I.V.:20] Out: 300 [Drains:300]  Left abd drain output about 300 cc' s brown fluid/stool; cx's - enterococcus; insertion site ok  Lab Results:   [facility] 01/30/12 0629  WBC 7.6  HGB 10.9*  HCT 32.6*  PLT 494*   BMET  Basename 01/31/12 0452 01/30/12 0629  NA 136 136  K 3.4* 3.1*  CL 104 104  CO2 23 24  GLUCOSE 120* 167*  BUN 8 8  CREATININE 0.70 0.49*  CALCIUM 8.1* 8.3*   PT/INR No results found for this basename: LABPROT:2,INR:2 in the last 72 hours ABG No results found for this basename: PHART:2,PCO2:2,PO2:2,HCO3:2 in the last 72 hours  Studies/Results: No results found. Results for orders placed during the hospital encounter of 01/16/12  SURGICAL PCR SCREEN     Status: Normal   Collection Time   01/17/12  8:28 AM      Component Value Range Status Comment   MRSA, PCR NEGATIVE  NEGATIVE Final    Staphylococcus aureus NEGATIVE  NEGATIVE Final   CULTURE, BLOOD (ROUTINE X 2)     Status: Normal   Collection Time   01/19/12 10:46 AM      Component Value Range Status Comment   Specimen Description BLOOD LEFT ARM   Final    Special Requests BOTTLES DRAWN AEROBIC AND ANAEROBIC 10CC   Final    Culture  Setup Time 01/19/2012 16:55   Final    Culture NO GROWTH 5 DAYS   Final    Report  Status 01/25/2012 FINAL   Final   CULTURE, BLOOD (ROUTINE X 2)     Status: Normal   Collection Time   01/19/12 10:48 AM      Component Value Range Status Comment   Specimen Description BLOOD LEFT ARM   Final    Special Requests BOTTLES DRAWN AEROBIC AND ANAEROBIC 10CC   Final    Culture  Setup Time 01/19/2012 16:55   Final    Culture NO GROWTH 5 DAYS   Final    Report Status 01/25/2012 FINAL   Final   CULTURE, ROUTINE-ABSCESS     Status: Normal   Collection Time   01/24/12  1:10 PM      Component Value Range Status Comment   Specimen Description PERITONEAL CAVITY   Final    Special Requests NONE   Final    Gram Stain     Final    Value: FEW WBC PRESENT, PREDOMINANTLY PMN     NO SQUAMOUS EPITHELIAL CELLS SEEN     ABUNDANT GRAM NEGATIVE RODS     ABUNDANT GRAM POSITIVE RODS     FEW GRAM VARIABLE ROD   Culture ABUNDANT ENTEROCOCCUS SPECIES   Final    Report Status 01/27/2012 FINAL   Final    Organism ID, Bacteria ENTEROCOCCUS  SPECIES   Final   CULTURE, ROUTINE-ABSCESS     Status: Normal   Collection Time   01/24/12  2:07 PM      Component Value Range Status Comment   Specimen Description PERITONEAL CAVITY   Final    Special Requests NONE   Final    Gram Stain     Final    Value: RARE WBC PRESENT, PREDOMINANTLY PMN     NO SQUAMOUS EPITHELIAL CELLS SEEN     ABUNDANT GRAM POSITIVE RODS     MODERATE GRAM NEGATIVE RODS     MODERATE GRAM VARIABLE ROD   Culture ABUNDANT ENTEROCOCCUS SPECIES   Final    Report Status 01/27/2012 FINAL   Final    Organism ID, Bacteria ENTEROCOCCUS SPECIES   Final     Anti-infectives: Anti-infectives     Start     Dose/Rate Route Frequency Ordered Stop   01/31/12 1000   ciprofloxacin (CIPRO) tablet 500 mg        500 mg Oral 2 times daily 01/31/12 0950     01/28/12 2300   vancomycin (VANCOCIN) 2,000 mg in sodium chloride 0.9 % 500 mL IVPB  Status:  Discontinued        2,000 mg 250 mL/hr over 120 Minutes Intravenous Every 12 hours 01/27/12 2239 01/27/12  2243   01/28/12 1200   vancomycin (VANCOCIN) 2,000 mg in sodium chloride 0.9 % 500 mL IVPB  Status:  Discontinued        2,000 mg 250 mL/hr over 120 Minutes Intravenous Every 12 hours 01/27/12 2233 01/27/12 2238   01/27/12 2300   vancomycin (VANCOCIN) 750 mg in sodium chloride 0.9 % 150 mL IVPB  Status:  Discontinued        750 mg 150 mL/hr over 60 Minutes Intravenous  Once 01/27/12 2232 01/27/12 2233   01/27/12 2300   vancomycin (VANCOCIN) 2,000 mg in sodium chloride 0.9 % 500 mL IVPB        2,000 mg 250 mL/hr over 120 Minutes Intravenous Every 12 hours 01/27/12 2244     01/25/12 1045   vancomycin (VANCOCIN) 1,250 mg in sodium chloride 0.9 % 250 mL IVPB  Status:  Discontinued        1,250 mg 166.7 mL/hr over 90 Minutes Intravenous 2 times daily 01/25/12 1020 01/27/12 2237   01/23/12 1300   micafungin (MYCAMINE) 100 mg in sodium chloride 0.9 % 100 mL IVPB        100 mg 100 mL/hr over 1 Hours Intravenous Daily 01/23/12 1158 01/30/12 1226   01/16/12 1800  piperacillin-tazobactam (ZOSYN) IVPB 3.375 g       3.375 g 12.5 mL/hr over 240 Minutes Intravenous 3 times per day 01/16/12 1721 01/27/12 1012          Assessment/Plan: s/p left abd abscess drainage 10/8; check f/u CT in few days( last 10/11); will need drain injection before removing (output still significant).   LOS: 15 days    Candee Hoon,D [facility] 01/31/2012

## 2012-01-31 NOTE — Progress Notes (Signed)
Patient refused dressing change for 2200 and 0600.  Explained to patient and his father how the dressing changes are scheduled but the father said that the dressing will be changed during the day.

## 2012-01-31 NOTE — Progress Notes (Signed)
Patient interviewed and examined, agree with PA note above.  Mariella Saa MD, FACS  01/31/2012 12:32 PM

## 2012-01-31 NOTE — Progress Notes (Signed)
Rehab admissions - Evaluated for possible admission.  I spoke with patient and his father.  I gave them rehab booklets to review.  Wife can provide supervision for patient after a potential rehab stay.  Noted patient on clears this am and TPN discontinued.  At this point, he is appropriate for inpatient rehab.  Patient states he has no insurance, so no authorization is needed from insurance carrier.  Once MD feels patient is medically ready, will consider admit to inpatient rehab.  Call me for questions.  #[phone]

## 2012-01-31 NOTE — Progress Notes (Signed)
Physical Therapy Treatment Patient Details Name: [name] MRN: [id number] DOB: 06-05-1970 Today's Date: 01/31/2012 Time: 4098-1191 PT Time Calculation (min): 31 min  PT Assessment / Plan / Recommendation Comments on Treatment Session  Pt. tolerated very well . continues to be deconditioned. Pt. can benefit from CIR to return to home at independent level.    Follow Up Recommendations  Post acute inpatient     Does the patient have the potential to tolerate intense rehabilitation  Yes, Recommend IP Rehab Screening  Barriers to Discharge        Equipment Recommendations  Rolling walker with 5" wheels;3 in 1 bedside comode    Recommendations for Other Services    Frequency Min 3X/week   Plan Discharge plan remains appropriate    Precautions / Restrictions Precautions Precaution Comments: ABD binder/colostomt/L drain    Pertinent Vitals/Pain HR up to 120, 2/4 dyspnea. sats 96%    Mobility  Bed Mobility Bed Mobility: Left Sidelying to Sit Rolling Left: 3: Mod assist Left Sidelying to Sit: 3: Mod assist;HOB elevated Sitting - Scoot to Edge of Bed: 4: Min assist Details for Bed Mobility Assistance: verbal cues for log rolling, pt  was able to hike hips to place ABD. binder.Lifting assistanc to sit upright. Transfers Sit to Stand: 4: Min assist;From bed;From chair/3-in-1;With upper extremity assist Stand to Sit: To chair/3-in-1;With upper extremity assist Details for Transfer Assistance: VC's to use UE's for transitions Ambulation/Gait Ambulation/Gait Assistance: 4: Min assist (+ follow with chair due to decreased endurance/pain) Ambulation Distance (Feet): 75 Feet (then 140) Assistive device: Rolling walker Gait Pattern: Wide base of support;Decreased stride length Gait velocity: decreased    Exercises     PT Diagnosis:    PT Problem List:   PT Treatment Interventions:     PT Goals Acute Rehab PT Goals PT Goal Formulation: With patient/family Time For Goal  Achievement: 02/09/12 Pt will Roll Supine to Left Side: with supervision PT Goal: Rolling Supine to Left Side - Progress: Progressing toward goal Pt will go Supine/Side to Sit: with min assist;with supervision PT Goal: Supine/Side to Sit - Progress: Goal set today Pt will go Sit to Supine/Side: with supervision;with HOB 0 degrees PT Goal: Sit to Supine/Side - Progress: Goal set today Pt will go Sit to Stand: with supervision PT Goal: Sit to Stand - Progress: Goal set today Pt will go Stand to Sit: with supervision PT Goal: Stand to Sit - Progress: Goal set today Pt will Ambulate: >150 feet;with supervision PT Goal: Ambulate - Progress: Goal set today  Visit Information  Last PT Received On: 01/31/12 Assistance Needed: +2    Subjective Data  Subjective: I want to walk farther   Cognition  Overall Cognitive Status: Appears within functional limits for tasks assessed/performed Area of Impairment: Memory;Following commands;Safety/judgement Arousal/Alertness: Awake/alert    Balance     End of Session PT - End of Session Equipment Utilized During Treatment:  (abd. binder) Activity Tolerance: Patient tolerated treatment well Patient left: in chair;with call bell/phone within reach;with family/visitor present   GP     Rada Hay 01/31/2012, 1:10 PM

## 2012-02-01 LAB — CBC
MCH: 30.5 pg (ref 26.0–34.0)
Platelets: 465 10*3/uL — ABNORMAL HIGH (ref 150–400)
RBC: 3.51 MIL/uL — ABNORMAL LOW (ref 4.22–5.81)
RDW: 13.4 % (ref 11.5–15.5)
WBC: 9.9 10*3/uL (ref 4.0–10.5)

## 2012-02-01 LAB — BASIC METABOLIC PANEL
CO2: 22 mEq/L (ref 19–32)
Calcium: 8.1 mg/dL — ABNORMAL LOW (ref 8.4–10.5)
Creatinine, Ser: 0.95 mg/dL (ref 0.50–1.35)
GFR calc Af Amer: >90 mL/min (ref >90)
Sodium: 133 mEq/L — ABNORMAL LOW (ref 135–145)

## 2012-02-01 LAB — VANCOMYCIN, TROUGH: Vancomycin Tr: 21.9 ug/mL — ABNORMAL HIGH (ref 10.0–20.0)

## 2012-02-01 MED ORDER — ENSURE COMPLETE PO LIQD
237.0000 mL | Freq: Two times a day (BID) | ORAL | Status: DC
  Administered 2012-02-01 – 2012-02-12 (×14): 237 mL via ORAL

## 2012-02-01 MED ORDER — POTASSIUM CHLORIDE CRYS ER 20 MEQ PO TBCR
40.0000 meq | EXTENDED_RELEASE_TABLET | Freq: Two times a day (BID) | ORAL | Status: DC
  Administered 2012-02-01 (×2): 40 meq via ORAL
  Filled 2012-02-01 (×4): qty 2

## 2012-02-01 MED ORDER — SODIUM CHLORIDE 0.9 % IR SOLN: 1000.0000 mL | Freq: Two times a day (BID) | Status: DC

## 2012-02-01 MED ORDER — PIPERACILLIN-TAZOBACTAM 3.375 G IVPB
3.3750 g | Freq: Three times a day (TID) | INTRAVENOUS | Status: DC
  Administered 2012-02-01 – 2012-02-03 (×7): 3.375 g via INTRAVENOUS
  Filled 2012-02-01 (×8): qty 50

## 2012-02-01 MED ORDER — VANCOMYCIN HCL 1000 MG IV SOLR
1500.0000 mg | Freq: Two times a day (BID) | INTRAVENOUS | Status: DC
  Administered 2012-02-01: 1500 mg via INTRAVENOUS
  Filled 2012-02-01 (×2): qty 1500

## 2012-02-01 MED ORDER — DAKINS (1/2 STRENGTH) 0.25 % EX SOLN
Freq: Two times a day (BID) | CUTANEOUS | Status: AC
  Administered 2012-02-01 – 2012-02-02 (×2)
  Administered 2012-02-02: 1
  Filled 2012-02-01: qty 473

## 2012-02-01 MED ORDER — NON FORMULARY: Freq: Two times a day (BID) | Status: DC

## 2012-02-01 NOTE — Progress Notes (Signed)
Patient ID: [name], male   DOB: 09/02/1970, 41 y.o.   MRN: [id number] 15 Days Post-Op  Subjective: Pt feels better today.  Mobilizing much better.  Tolerating diet.  Pain controlled  Objective: Vital signs in last 24 hours: Temp:  [98.5 F (36.9 C)-99.1 F (37.3 C)] 98.5 F (36.9 C) (10/16 0550) Pulse Rate:  [86-98] 91  (10/16 0550) Resp:  [14-18] 14  (10/16 0550) BP: (106-131)/(67-85) 113/75 mmHg (10/16 0550) SpO2:  [97 %-98 %] 98 % (10/16 0550) Weight:  [276 lb 10.8 oz (125.5 kg)] 276 lb 10.8 oz (125.5 kg) (10/16 0610) Last BM Date: 01/31/12  Intake/Output from previous day: 10/15 0701 - 10/16 0700 In: 1450 [P.O.:160; I.V.:20; IV Piggyback:1010] Out: 2640 [Urine:1550; Drains:410; Stool:680] Intake/Output this shift:    PE: Abd: soft, wound just packed, packing not removed.  Ostomy to be changed today, will look at stoma with dressing change.  Drain with dark brown cloudy output.  Lab Results:   Basename 02/01/12 0455 01/30/12 0629  WBC 9.9 7.6  HGB 10.7* 10.9*  HCT 32.4* 32.6*  PLT 465* 494*   BMET  Basename 02/01/12 0455 01/31/12 0452  NA 133* 136  K 3.2* 3.4*  CL 100 104  CO2 22 23  GLUCOSE 177* 120*  BUN 10 8  CREATININE 0.95 0.70  CALCIUM 8.1* 8.1*   PT/INR No results found for this basename: LABPROT:2,INR:2 in the last 72 hours CMP     Component Value Date/Time   NA 133* 02/01/2012 0455   K 3.2* 02/01/2012 0455   CL 100 02/01/2012 0455   CO2 22 02/01/2012 0455   GLUCOSE 177* 02/01/2012 0455   BUN 10 02/01/2012 0455   CREATININE 0.95 02/01/2012 0455   CALCIUM 8.1* 02/01/2012 0455   PROT 6.0 01/30/2012 0629   ALBUMIN 1.8* 01/30/2012 0629   AST 49* 01/30/2012 0629   ALT 89* 01/30/2012 0629   ALKPHOS 70 01/30/2012 0629   BILITOT 0.2* 01/30/2012 0629   GFRNONAA >90 02/01/2012 0455   GFRAA >90 02/01/2012 0455   Lipase     Component Value Date/Time   LIPASE 24 01/16/2012 1210       Studies/Results: No results  found.  Anti-infectives: Anti-infectives     Start     Dose/Rate Route Frequency Ordered Stop   02/01/12 0800  piperacillin-tazobactam (ZOSYN) IVPB 3.375 g       3.375 g 12.5 mL/hr over 240 Minutes Intravenous Every 8 hours 02/01/12 0724     01/31/12 1000   ciprofloxacin (CIPRO) tablet 500 mg  Status:  Discontinued        500 mg Oral 2 times daily 01/31/12 0950 02/01/12 0724   01/28/12 2300   vancomycin (VANCOCIN) 2,000 mg in sodium chloride 0.9 % 500 mL IVPB  Status:  Discontinued        2,000 mg 250 mL/hr over 120 Minutes Intravenous Every 12 hours 01/27/12 2239 01/27/12 2243   01/28/12 1200   vancomycin (VANCOCIN) 2,000 mg in sodium chloride 0.9 % 500 mL IVPB  Status:  Discontinued        2,000 mg 250 mL/hr over 120 Minutes Intravenous Every 12 hours 01/27/12 2233 01/27/12 2238   01/27/12 2300   vancomycin (VANCOCIN) 750 mg in sodium chloride 0.9 % 150 mL IVPB  Status:  Discontinued        750 mg 150 mL/hr over 60 Minutes Intravenous  Once 01/27/12 2232 01/27/12 2233   01/27/12 2300   vancomycin (VANCOCIN) 2,000 mg in sodium  chloride 0.9 % 500 mL IVPB        2,000 mg 250 mL/hr over 120 Minutes Intravenous Every 12 hours 01/27/12 2244     01/25/12 1045   vancomycin (VANCOCIN) 1,250 mg in sodium chloride 0.9 % 250 mL IVPB  Status:  Discontinued        1,250 mg 166.7 mL/hr over 90 Minutes Intravenous 2 times daily 01/25/12 1020 01/27/12 2237   01/23/12 1300   micafungin (MYCAMINE) 100 mg in sodium chloride 0.9 % 100 mL IVPB        100 mg 100 mL/hr over 1 Hours Intravenous Daily 01/23/12 1158 01/30/12 1226   01/16/12 1800  piperacillin-tazobactam (ZOSYN) IVPB 3.375 g       3.375 g 12.5 mL/hr over 240 Minutes Intravenous 3 times per day 01/16/12 1721 01/27/12 1012           Assessment/Plan  1. S/p Hartman's for perf tics 2. Sepsis, resolving 3. Abdominal abscess, s/p perc drain 4. Dehiscence of wound, no evisceration 5. Retracted stoma 6. Hypokalemia  Plan: 1.  Replace K 2. Add zosyn to abx therapy as abscess drain shows gram neg rods that vanc alone probably not covering 3. Add Dakin's solution to dressing changes for 48 hrs to efficiently treat pseudomonas 4. Look at stoma with ostomy change today 5. Repeat CT scan tomorrow 6. To CIR hopefully when medically stable.    LOS: 16 days    [name] 02/01/2012, 8:50 AM Pager: [phone]

## 2012-02-01 NOTE — Progress Notes (Signed)
Per report, patient is for CIR/Rehab or home with family and HH/DME. CSW signing off- please re consult if CSW needs arise. Reece Levy, MSW, Theresia Majors [phone]

## 2012-02-01 NOTE — Progress Notes (Signed)
Patient interviewed and examined, agree with PA note above. We examined his stoma which is retracted but viable mucosa in the deep SQ.  Continued improvement Mariella Saa MD, FACS  02/01/2012 4:34 PM

## 2012-02-01 NOTE — Progress Notes (Signed)
Nutrition Follow-up  Intervention: Ensure Complete BID. Encouraged small frequent meals/snacks. Will monitor.   Diet Order: Regular  - TPN d/c 10/14. Pt and family report that pt has been only doing a few bites of food at mealtimes for the past few days r/t early satiety. Pt denies any nausea, just states that he gets full easy. Pt states the best thing for him has been cold liquids. Pt agreeable to getting Ensure for additional nutrition. Will continue to monitor for appropriateness of diet education for new colostomy, pt had just received pain medication before RD visit and was attempting to rest.   Meds: Scheduled Meds:   . antiseptic oral rinse  15 mL Mouth Rinse q12n4p  . chlorhexidine  15 mL Mouth Rinse BID  . enoxaparin  60 mg Subcutaneous Q24H  . furosemide  20 mg Intravenous Daily  . metoprolol  5 mg Intravenous Q6H  . pantoprazole (PROTONIX) IV  40 mg Intravenous QHS  . piperacillin-tazobactam (ZOSYN)  IV  3.375 g Intravenous Q8H  . potassium chloride  40 mEq Oral BID  . potassium chloride  40 mEq Oral BID  . sodium chloride  10-40 mL Intracatheter Q12H  . sodium hypochlorite   Irrigation BID  . vancomycin  2,000 mg Intravenous Q12H  . DISCONTD: ciprofloxacin  500 mg Oral BID  . DISCONTD: NON FORMULARY   Topical BID  . DISCONTD: sodium chloride irrigation  1,000 mL Irrigation BID   Continuous Infusions:  PRN Meds:.acetaminophen (TYLENOL) oral liquid 160 mg/5 mL, albuterol-ipratropium, alum & mag hydroxide-simeth, fentaNYL, hydrALAZINE, LORazepam, magic mouthwash, ondansetron, oxyCODONE-acetaminophen, sodium chloride  Labs:  CMP     Component Value Date/Time   NA 133* 02/01/2012 0455   K 3.2* 02/01/2012 0455   CL 100 02/01/2012 0455   CO2 22 02/01/2012 0455   GLUCOSE 177* 02/01/2012 0455   BUN 10 02/01/2012 0455   CREATININE 0.95 02/01/2012 0455   CALCIUM 8.1* 02/01/2012 0455   PROT 6.0 01/30/2012 0629   ALBUMIN 1.8* 01/30/2012 0629   AST 49* 01/30/2012 0629   ALT  89* 01/30/2012 0629   ALKPHOS 70 01/30/2012 0629   BILITOT 0.2* 01/30/2012 0629   GFRNONAA >90 02/01/2012 0455   GFRAA >90 02/01/2012 0455     Intake/Output Summary (Last 24 hours) at 02/01/12 1053 Last data filed at 02/01/12 0600  Gross per 24 hour  Intake   1345 ml  Output   2040 ml  Net   -695 ml   Colostomy output - total yesterday  Weight Status:   10/10 283 lb 4.7 oz 10/16 276 lb 10.8 oz  Re-estimated needs:   1900-2250 calories 90-115g protein  Nutrition Dx: Inadequate oral intake r/t altered GI function AEB concerns of leaking abdomen - no longer appropriate.   New nutrition dx: Inadequate oral intake r/t early satiety AEB <50% meal intake.  Goal: Meet >95% estimated needs with TPN plus lipids - no longer appropriate, pt no longer on TPN.   New goal: Pt to consume >75% of meals/supplements.   Monitor:  Weights, labs, intake, colostomy output  Levon Hedger MS, RD, LDN [phone] Pager [phone] After Hours Pager

## 2012-02-01 NOTE — Progress Notes (Signed)
Pt refused K+ pills last night.  Stated that they 'melted in his mouth' and created a powder that he could not swallow.  Day nurse reported to break in half and mix with jello.  Pt refused to try this method.  Pt refused to try melting the pills or taking them whole.  Sherron Monday

## 2012-02-01 NOTE — Progress Notes (Signed)
ANTIBIOTIC CONSULT NOTE - FOLLOW UP  Pharmacy Consult for Vancomycin Indication: sepsis  No Known Allergies  Patient Measurements: Height: 5\' 10"  (177.8 cm) Weight: 276 lb 10.8 oz (125.5 kg) IBW/kg (Calculated) : 73   Vital Signs: Temp: 98.5 F (36.9 C) (10/16 0550) Temp src: Oral (10/16 0550) BP: 113/75 mmHg (10/16 0550) Pulse Rate: 91  (10/16 0550) Intake/Output from previous day: 10/15 0701 - 10/16 0700 In: 1450 [P.O.:160; I.V.:20; IV Piggyback:1010] Out: 2640 [Urine:1550; Drains:410; Stool:680] Intake/Output from this shift: Total I/O In: -  Out: 300 [Urine:300]  Labs:  [facility] 02/01/12 0455 01/31/12 0452 01/30/12 0629  WBC 9.9 -- 7.6  HGB 10.7* -- 10.9*  PLT 465* -- 494*  LABCREA -- -- --  CREATININE 0.95 0.70 0.49*   Estimated Creatinine Clearance: 137.4 ml/min (by C-G formula based on Cr of 0.95).   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 CT on 10/7 with large fluid collection, percutaneous drain placed 10/8.  Patient transferred back to floor 10/13.  D#8 Vanc 2000 mg IV BID for abundant enterococcus (amp resist) that grew on peritoneal fluid culture. Per CCM note, will need 14 days min of vanc.   Vancomycin dose adjusted 10/11 following low VT of 6.8 on 1250 mg BID dosing.   Cipro changed to Zosyn 10/16, appears drain contents were not sent for cx last week and surgery noting sweet smell and some green-blue drainage c/w pseudomonas.  Vancomycin trough 10/16 = 21.9 mcg/ml, drawn 3.5hrs late so true trough little higher  Scr=0.95, still WNL but trending up, UOP is adequate  Goal of Therapy:  Vancomycin trough level 15-20  mcg/ml  Plan:  Change vancomycin to 1500mg  IV q12h for est Trough = 75mcg/ml Follow renal function and depending of LOT, recheck Css trough with new dose  Suzette Battiest, Tad Moore 02/01/2012,1:45 PM

## 2012-02-01 NOTE — Consult Note (Addendum)
WOC ostomy consult  Stoma type/location: Patient seen in ostomy follow up with Dr. Johna Sheriff and PA, Tresa Endo. Dr. Johna Sheriff provided stoma examination. Stomal assessment/size: Oval opening in skin measures 1 and 3/4 inches by 1 and 1/8 inch.  Stoma viable, but recessed below skin level approximately 3cm.  Stomal mucosa is viable, measures approximately 1 and 1/8 inch round Peristomal assessment: intact Treatment options for stomal/peristomal skin: none indicated Output thin, brown stool Ostomy pouching: 1pc. With built-in convexity and barrier ring. Education provided: No one from family with observe ostomy pouching system change and patient is unable to see stoma.  Agree with plan for inpatient rehab as home health offers only intermittent services and, while pouch changes are twice weekly, ostomy pouch emptying is 4-6 times daily. Patient established with [facility] acute supply sampling and resource program per his request. I will continue to follow with you to support patient, family, staff and surgical team.   Thanks, Ladona Mow, MSN, RN, [facility], CWOCN [phone])

## 2012-02-01 NOTE — Progress Notes (Signed)
15 Days Post-Op  Subjective: Pt doing ok, sitting up in chair, has ambulated; tol liquid diet  Objective: Vital signs in last 24 hours: Temp:  [98.5 F (36.9 C)-99.1 F (37.3 C)] 98.5 F (36.9 C) (10/16 0550) Pulse Rate:  [86-98] 91  (10/16 0550) Resp:  [14-18] 14  (10/16 0550) BP: (106-131)/(67-85) 113/75 mmHg (10/16 0550) SpO2:  [97 %-98 %] 98 % (10/16 0550) Weight:  [276 lb 10.8 oz (125.5 kg)] 276 lb 10.8 oz (125.5 kg) (10/16 0610) Last BM Date: 01/31/12  Intake/Output from previous day: 10/15 0701 - 10/16 0700 In: 1450 [P.O.:160; I.V.:20; IV Piggyback:1010] Out: 2640 [Urine:1550; Drains:410; Stool:680] Intake/Output this shift:    Left abd drain intact, output 410 cc's brown fluid/stool, insertion site ok  Lab Results:   [facility] 02/01/12 0455 01/30/12 0629  WBC 9.9 7.6  HGB 10.7* 10.9*  HCT 32.4* 32.6*  PLT 465* 494*   BMET  Basename 02/01/12 0455 01/31/12 0452  NA 133* 136  K 3.2* 3.4*  CL 100 104  CO2 22 23  GLUCOSE 177* 120*  BUN 10 8  CREATININE 0.95 0.70  CALCIUM 8.1* 8.1*   PT/INR No results found for this basename: LABPROT:2,INR:2 in the last 72 hours ABG No results found for this basename: PHART:2,PCO2:2,PO2:2,HCO3:2 in the last 72 hours Results for orders placed during the hospital encounter of 01/16/12  SURGICAL PCR SCREEN     Status: Normal   Collection Time   01/17/12  8:28 AM      Component Value Range Status Comment   MRSA, PCR NEGATIVE  NEGATIVE Final    Staphylococcus aureus NEGATIVE  NEGATIVE Final   CULTURE, BLOOD (ROUTINE X 2)     Status: Normal   Collection Time   01/19/12 10:46 AM      Component Value Range Status Comment   Specimen Description BLOOD LEFT ARM   Final    Special Requests BOTTLES DRAWN AEROBIC AND ANAEROBIC 10CC   Final    Culture  Setup Time 01/19/2012 16:55   Final    Culture NO GROWTH 5 DAYS   Final    Report Status 01/25/2012 FINAL   Final   CULTURE, BLOOD (ROUTINE X 2)     Status: Normal   Collection Time     01/19/12 10:48 AM      Component Value Range Status Comment   Specimen Description BLOOD LEFT ARM   Final    Special Requests BOTTLES DRAWN AEROBIC AND ANAEROBIC 10CC   Final    Culture  Setup Time 01/19/2012 16:55   Final    Culture NO GROWTH 5 DAYS   Final    Report Status 01/25/2012 FINAL   Final   CULTURE, ROUTINE-ABSCESS     Status: Normal   Collection Time   01/24/12  1:10 PM      Component Value Range Status Comment   Specimen Description PERITONEAL CAVITY   Final    Special Requests NONE   Final    Gram Stain     Final    Value: FEW WBC PRESENT, PREDOMINANTLY PMN     NO SQUAMOUS EPITHELIAL CELLS SEEN     ABUNDANT GRAM NEGATIVE RODS     ABUNDANT GRAM POSITIVE RODS     FEW GRAM VARIABLE ROD   Culture ABUNDANT ENTEROCOCCUS SPECIES   Final    Report Status 01/27/2012 FINAL   Final    Organism ID, Bacteria ENTEROCOCCUS SPECIES   Final   CULTURE, ROUTINE-ABSCESS     Status:  Normal   Collection Time   01/24/12  2:07 PM      Component Value Range Status Comment   Specimen Description PERITONEAL CAVITY   Final    Special Requests NONE   Final    Gram Stain     Final    Value: RARE WBC PRESENT, PREDOMINANTLY PMN     NO SQUAMOUS EPITHELIAL CELLS SEEN     ABUNDANT GRAM POSITIVE RODS     MODERATE GRAM NEGATIVE RODS     MODERATE GRAM VARIABLE ROD   Culture ABUNDANT ENTEROCOCCUS SPECIES   Final    Report Status 01/27/2012 FINAL   Final    Organism ID, Bacteria ENTEROCOCCUS SPECIES   Final   BODY FLUID CULTURE     Status: Normal (Preliminary result)   Collection Time   01/31/12  5:41 PM      Component Value Range Status Comment   Specimen Description ABSCESS   Final    Special Requests Normal   Final    Gram Stain     Final    Value: ABUNDANT WBC PRESENT,BOTH PMN AND MONONUCLEAR     ABUNDANT GRAM NEGATIVE RODS     Gram Stain Report Called to,Read Back By and Verified With: Gram Stain Report Called to,Read Back By and Verified With: CLARISSA STATEN RN 02/01/12 8:15AM BY MILSH    Culture NO GROWTH   Final    Report Status PENDING   Incomplete     Studies/Results: No results found.  Anti-infectives: Anti-infectives     Start     Dose/Rate Route Frequency Ordered Stop   02/01/12 0800   piperacillin-tazobactam (ZOSYN) IVPB 3.375 g        3.375 g 12.5 mL/hr over 240 Minutes Intravenous Every 8 hours 02/01/12 0724     01/31/12 1000   ciprofloxacin (CIPRO) tablet 500 mg  Status:  Discontinued        500 mg Oral 2 times daily 01/31/12 0950 02/01/12 0724   01/28/12 2300   vancomycin (VANCOCIN) 2,000 mg in sodium chloride 0.9 % 500 mL IVPB  Status:  Discontinued        2,000 mg 250 mL/hr over 120 Minutes Intravenous Every 12 hours 01/27/12 2239 01/27/12 2243   01/28/12 1200   vancomycin (VANCOCIN) 2,000 mg in sodium chloride 0.9 % 500 mL IVPB  Status:  Discontinued        2,000 mg 250 mL/hr over 120 Minutes Intravenous Every 12 hours 01/27/12 2233 01/27/12 2238   01/27/12 2300   vancomycin (VANCOCIN) 750 mg in sodium chloride 0.9 % 150 mL IVPB  Status:  Discontinued        750 mg 150 mL/hr over 60 Minutes Intravenous  Once 01/27/12 2232 01/27/12 2233   01/27/12 2300   vancomycin (VANCOCIN) 2,000 mg in sodium chloride 0.9 % 500 mL IVPB        2,000 mg 250 mL/hr over 120 Minutes Intravenous Every 12 hours 01/27/12 2244     01/25/12 1045   vancomycin (VANCOCIN) 1,250 mg in sodium chloride 0.9 % 250 mL IVPB  Status:  Discontinued        1,250 mg 166.7 mL/hr over 90 Minutes Intravenous 2 times daily 01/25/12 1020 01/27/12 2237   01/23/12 1300   micafungin (MYCAMINE) 100 mg in sodium chloride 0.9 % 100 mL IVPB        100 mg 100 mL/hr over 1 Hours Intravenous Daily 01/23/12 1158 01/30/12 1226   01/16/12 1800   piperacillin-tazobactam (ZOSYN) IVPB  3.375 g        3.375 g 12.5 mL/hr over 240 Minutes Intravenous 3 times per day 01/16/12 1721 01/27/12 1012          Assessment/Plan: s/p left abd abscess drainage 10/8; check f/u CT on 10/17; other plans as per  CCS.   LOS: 16 days    Carma Dwiggins,D [facility] 02/01/2012

## 2012-02-02 ENCOUNTER — Inpatient Hospital Stay (HOSPITAL_COMMUNITY): Payer: Medicaid Other

## 2012-02-02 LAB — URINALYSIS, ROUTINE W REFLEX MICROSCOPIC
Leukocytes, UA: NEGATIVE
Nitrite: NEGATIVE
Protein, ur: 30 mg/dL — AB
Specific Gravity, Urine: 1.012 (ref 1.005–1.030)
Urobilinogen, UA: 0.2 mg/dL (ref 0.0–1.0)

## 2012-02-02 LAB — BASIC METABOLIC PANEL
CO2: 21 mEq/L (ref 19–32)
Calcium: 7.8 mg/dL — ABNORMAL LOW (ref 8.4–10.5)
Calcium: 7.9 mg/dL — ABNORMAL LOW (ref 8.4–10.5)
Creatinine, Ser: 2.29 mg/dL — ABNORMAL HIGH (ref 0.50–1.35)
GFR calc Af Amer: 39 mL/min — ABNORMAL LOW (ref >90)
GFR calc Af Amer: 35 mL/min — ABNORMAL LOW (ref >90)
GFR calc non Af Amer: 34 mL/min — ABNORMAL LOW (ref >90)
GFR calc non Af Amer: 30 mL/min — ABNORMAL LOW (ref >90)
Glucose, Bld: 103 mg/dL — ABNORMAL HIGH (ref 70–99)
Potassium: 4.2 mEq/L (ref 3.5–5.1)
Sodium: 130 mEq/L — ABNORMAL LOW (ref 135–145)
Sodium: 131 mEq/L — ABNORMAL LOW (ref 135–145)

## 2012-02-02 LAB — URINE MICROSCOPIC-ADD ON

## 2012-02-02 MED ORDER — POTASSIUM CHLORIDE 2 MEQ/ML IV SOLN
INTRAVENOUS | Status: DC
  Administered 2012-02-02 (×2): via INTRAVENOUS
  Filled 2012-02-02 (×4): qty 1000

## 2012-02-02 MED ORDER — IOHEXOL 300 MG/ML  SOLN
80.0000 mL | Freq: Once | INTRAMUSCULAR | Status: AC | PRN
  Administered 2012-02-02: 80 mL via INTRAVENOUS

## 2012-02-02 NOTE — Progress Notes (Signed)
Rehab admissions - I spoke with patient and his dad this morning.  Patient walked all around unit twice yesterday and his been up on his own.  Dad plans to stay with patient for a week after discharge.  Patient is progressing well now and no longer needs acute inpatient rehab stay.  Recommend home with [facility] therapies once he is medically ready for discharge.  #[phone]

## 2012-02-02 NOTE — Progress Notes (Signed)
Occupational Therapy Treatment Patient Details Name: [name] MRN: [id number] DOB: [birth year] Today's Date: 02/02/2012 Time: 1040-1108 OT Time Calculation (min): 28 min  OT Assessment / Plan / Recommendation Comments on Treatment Session      Follow Up Recommendations  Home health OT;Supervision/Assistance - 24 hour (initially)    Barriers to Discharge       Equipment Recommendations  Rolling walker with 5" wheels;3 in 1 bedside comode    Recommendations for Other Services    Frequency Min 2X/week   Plan Discharge plan needs to be updated    Precautions / Restrictions Precautions Precaution Comments: ABD binder, drain, colostomy Restrictions Weight Bearing Restrictions: No   Pertinent Vitals/Pain 6/10 abdomen; premedicated    ADL  Grooming: Performed;Supervision/safety;Teeth care Where Assessed - Grooming: Supported standing Upper Body Dressing: Performed;Minimal assistance (with AE:  pillowcase as pants and socks ) Where Assessed - Upper Body Dressing: Supported sit to stand Toilet Transfer: Simulated;Min Pension scheme manager Method: Sit to Barista:  (bed sink bed) Transfers/Ambulation Related to ADLs: ambulated with PT.  Took standing rest break.   ADL Comments: Educated on AE today.  Also educated that we will start bed mobility rolling from flat bed.  He did this for sit to supine today.      OT Diagnosis:    OT Problem List:   OT Treatment Interventions:     OT Goals Acute Rehab OT Goals Time For Goal Achievement: 02/09/12 ADL Goals Pt Will Perform Grooming: with supervision;Standing at sink ADL Goal: Grooming - Progress: Met Pt Will Perform Lower Body Dressing: with min assist;Sit to stand from bed;with adaptive equipment ADL Goal: Lower Body Dressing - Progress: Not progressing Pt Will Transfer to Toilet: with min assist;Ambulation;3-in-1 ADL Goal: Toilet Transfer - Progress: Progressing toward goals (simulated  only)  Visit Information  Last OT Received On: 02/02/12 Assistance Needed: +1 PT/OT Co-Evaluation/Treatment: Yes    Subjective Data      Prior Functioning       Cognition  Overall Cognitive Status: Appears within functional limits for tasks assessed/performed Arousal/Alertness: Awake/alert Orientation Level: Appears intact for tasks assessed Behavior During Session: [facility] for tasks performed    Mobility  Shoulder Instructions Bed Mobility Bed Mobility: Supine to Sit;Sit to Supine Rolling Left: 3: Mod assist (with hob raised) Supine to Sit: 3: Mod assist Sit to Supine: 3: Mod assist;HOB elevated;With rail Details for Bed Mobility Assistance: Increased time. VCs safety, technique, hand placement. Assist for trunk to upright and LEs onto bed. Disccused need to practice rolling for bed mobility.  Transfers Sit to Stand: 4: Min assist;From bed;With upper extremity assist Stand to Sit: 4: Min assist;To bed;With upper extremity assist Details for Transfer Assistance: VCs hand placement. Assist to rise, control descent.        Exercises      Balance Balance Balance Assessed: Yes Static Standing Balance Static Standing - Balance Support: Right upper extremity supported;Left upper extremity supported Static Standing - Level of Assistance: 5: Stand by assistance Static Standing - Comment/# of Minutes: Stood statically at sink while brushing teeth 2-3 minutes-no lob.    End of Session OT - End of Session Activity Tolerance: Patient limited by fatigue Patient left: in bed;with call bell/phone within reach;with family/visitor present  GO     Shynia Daleo 02/02/2012, 11:51 AM Marica Otter, OTR/L [phone] 02/02/2012

## 2012-02-02 NOTE — Progress Notes (Signed)
Patient ID: [name], male   DOB: 1970/06/10, 41 y.o.   MRN: [id number] 16 Days Post-Op  Subjective: Pt feels well today.  No complaints.  Eating mostly soft foods still, but tolerating them well.    Objective: Vital signs in last 24 hours: Temp:  [99.4 F (37.4 C)-100.8 F (38.2 C)] 99.4 F (37.4 C) (10/17 0628) Pulse Rate:  [91-99] 91  (10/17 0628) Resp:  [18-20] 18  (10/17 0628) BP: (114-129)/(69-82) 125/69 mmHg (10/17 0628) SpO2:  [96 %-97 %] 97 % (10/17 0628) Weight:  [281 lb 8.4 oz (127.7 kg)-284 lb 13.4 oz (129.2 kg)] 281 lb 8.4 oz (127.7 kg) (10/17 0500) Last BM Date: 02/02/12  Intake/Output from previous day: 10/16 0701 - 10/17 0700 In: 1700 [P.O.:960; IV Piggyback:650] Out: 1630 [Urine:1330; Stool:300] Intake/Output this shift: Total I/O In: 20 [I.V.:20] Out: -   PE: Abd: soft, wound is stable, ostomy stable, drain with minimal output currently Heart: regular Lungs: CTAB  Lab Results:   [facility] 02/01/12 0455  WBC 9.9  HGB 10.7*  HCT 32.4*  PLT 465*   BMET  Basename 02/02/12 0545 02/01/12 0455  NA 130* 133*  K 4.2 3.2*  CL 98 100  CO2 21 22  GLUCOSE 117* 177*  BUN 16 10  CREATININE 2.29* 0.95  CALCIUM 7.8* 8.1*   PT/INR No results found for this basename: LABPROT:2,INR:2 in the last 72 hours CMP     Component Value Date/Time   NA 130* 02/02/2012 0545   K 4.2 02/02/2012 0545   CL 98 02/02/2012 0545   CO2 21 02/02/2012 0545   GLUCOSE 117* 02/02/2012 0545   BUN 16 02/02/2012 0545   CREATININE 2.29* 02/02/2012 0545   CALCIUM 7.8* 02/02/2012 0545   PROT 6.0 01/30/2012 0629   ALBUMIN 1.8* 01/30/2012 0629   AST 49* 01/30/2012 0629   ALT 89* 01/30/2012 0629   ALKPHOS 70 01/30/2012 0629   BILITOT 0.2* 01/30/2012 0629   GFRNONAA 34* 02/02/2012 0545   GFRAA 39* 02/02/2012 0545   Lipase     Component Value Date/Time   LIPASE 24 01/16/2012 1210       Studies/Results: No results found.  Anti-infectives: Anti-infectives    Start     Dose/Rate Route Frequency Ordered Stop   02/01/12 2300   vancomycin (VANCOCIN) 1,500 mg in sodium chloride 0.9 % 500 mL IVPB        1,500 mg 250 mL/hr over 120 Minutes Intravenous Every 12 hours 02/01/12 1355     02/01/12 0800  piperacillin-tazobactam (ZOSYN) IVPB 3.375 g       3.375 g 12.5 mL/hr over 240 Minutes Intravenous Every 8 hours 02/01/12 0724     01/31/12 1000   ciprofloxacin (CIPRO) tablet 500 mg  Status:  Discontinued        500 mg Oral 2 times daily 01/31/12 0950 02/01/12 0724   01/28/12 2300   vancomycin (VANCOCIN) 2,000 mg in sodium chloride 0.9 % 500 mL IVPB  Status:  Discontinued        2,000 mg 250 mL/hr over 120 Minutes Intravenous Every 12 hours 01/27/12 2239 01/27/12 2243   01/28/12 1200   vancomycin (VANCOCIN) 2,000 mg in sodium chloride 0.9 % 500 mL IVPB  Status:  Discontinued        2,000 mg 250 mL/hr over 120 Minutes Intravenous Every 12 hours 01/27/12 2233 01/27/12 2238   01/27/12 2300   vancomycin (VANCOCIN) 750 mg in sodium chloride 0.9 % 150 mL IVPB  Status:  Discontinued        750 mg 150 mL/hr over 60 Minutes Intravenous  Once 01/27/12 2232 01/27/12 2233   01/27/12 2300   vancomycin (VANCOCIN) 2,000 mg in sodium chloride 0.9 % 500 mL IVPB  Status:  Discontinued        2,000 mg 250 mL/hr over 120 Minutes Intravenous Every 12 hours 01/27/12 2244 02/01/12 1355   01/25/12 1045   vancomycin (VANCOCIN) 1,250 mg in sodium chloride 0.9 % 250 mL IVPB  Status:  Discontinued        1,250 mg 166.7 mL/hr over 90 Minutes Intravenous 2 times daily 01/25/12 1020 01/27/12 2237   01/23/12 1300   micafungin (MYCAMINE) 100 mg in sodium chloride 0.9 % 100 mL IVPB        100 mg 100 mL/hr over 1 Hours Intravenous Daily 01/23/12 1158 01/30/12 1226   01/16/12 1800  piperacillin-tazobactam (ZOSYN) IVPB 3.375 g       3.375 g 12.5 mL/hr over 240 Minutes Intravenous 3 times per day 01/16/12 1721 01/27/12 1012           Assessment/Plan  1. S/p HArtman's for  perf tics 2. VDRF, resolved 3. ARF,  ? If Cr is true today as was normal yesterday 4. Deconditioning 5. Abdominal abscess  Plan: 1. Repeat CT scan today.  Cr went from 0.95 today to 2.29.  I am going to recheck this to make sure it's legit.  If so, then he can not have IV contrast today with his CT scan.  If this is true, we may also have to start adjusting his vanc and zosyn.  He was getting daily lasix for some reason which was held as well. 2. Cont diet 3. Hopeful for home at dc, suspect he's too good for CIR :)  LOS: 17 days    Charnele Semple E 02/02/2012, 8:23 AM Pager: [phone]  ADDENDUM: Patient has Cr of 2.55.  By the time this lab came back, I called radiology to alert them of this and to cancel his IV contrast for his CT scan.  However, apparently the patient has been taken down early and the lab they saw was 0.95 from the day before.  Therefore, IV contrast was given.  It is likely his Cr will worsen before it gets better.  I have restarted him on IVFs.  I have d/w Dr. Johna Sheriff.  We will go ahead and ask nephrology to see the patient to assist with such an immediate rise in creatinine, despite still having good UOP. [name] E 11:59 AM

## 2012-02-02 NOTE — Consult Note (Addendum)
[name]        RENAL CONSULT   Reason for Consult: Acute worsening of renal function Referring Physician: Dr. Kendrick [name] is a 41 y.o. white man admitted on 16 January 2012 with abdominal pain. He had a perforated diverticulum with stricture causing large bowel obstruction.  He underwent exploratory laparotomy, partial colectomy, and colostomy on 01/17/2012.  Enterococci grew from peritoneal fluid. He was treated with vancomycin and Zosyn Most recent vancomycin level was 21.9. Vancomycin has been discontinued.  Temperature was 100.8 at 10 PM last night.  Renal function had been stable until today. He is currently receiving half-normal saline with 20 of KCl at 100 cc per hour. Results of creatinine and weight are as follows:  Date 1 October 7 October 13 October 17 October Weight     136 kg   125.8 kg           127.4  kg           127.7 kg CR        0.6                 0.95       0.5      2.3-2.5  He underwent CT scan of abdomen and pelvis with 80 cc IV contrast early this morning. Renal consult was requested [name]  Past Medical History  Diagnosis Date  . Urinary anastomotic stricture undiagnosed     hard to be cathed; urology did the last time  . Diverticulitis   . Colon obstruction 01/17/2012  . Obesity, Class III, BMI 40-49.9 (morbid obesity) 01/17/2012    History reviewed. No pertinent family history.  Social History: Born in [place] up in [place], [place], graduated from [employer], He works as a Naval architect. Lives with wife, married 10 yr, smokes 30-pack-years, doesn't drink alcohol   Allergies: No Known Allergies  Family history: Father age 54, mother age 33 healthy, one sister healthy. No family history renal disease  Ct Abdomen Pelvis W Contrast  02/02/2012  *RADIOLOGY REPORT*  Clinical Data: Post partial colectomy/colostomy.  Left abscess drain in place.  Abdominal pain.  CT ABDOMEN AND PELVIS WITH CONTRAST   Technique:  Multidetector CT imaging of the abdomen and pelvis was performed following the standard protocol during bolus administration of intravenous contrast.  Contrast: 80mL OMNIPAQUE IOHEXOL 300 MG/ML  SOLN  Comparison: Most recent examination 01/27/2012.  Findings: Post resection distal transverse colon down proximal descending colon with colostomy formation left upper quadrant. Similar appearance of the colostomy to the prior exam.  Within the left aspect of the abdomen and pelvis, there is a complex fluid and air collection/abscess.  Drainage catheter within the superior posterior aspect of this abscess.  Abscess has increased  in size when compared to the prior examination.  The proximal aspect of the mid to distal descending colon anastomoses is contained within the abscess collection.  Leak at this level cannot be excluded.  Slight increase in size of left-sided pleural effusion.  Bibasilar subsegmental atelectasis.  Ventral incision healing by secondary intent.  Fatty liver without focal hepatic lesion.  Gallbladder sludge without calcified gallstone.  Poorly functioning kidneys without focal hepatic, splenic, pancreatic or adrenal lesion.  Scattered small lymph nodes mesenteric region.  Under distended thickened bladder slightly asymmetric with thickening greater superiorly and on the left.  Degenerative changes with calcified disc protrusions lower thoracic and lumbar spine.  IMPRESSION: Post resection distal transverse colon  down proximal descending colon with colostomy formation left upper quadrant.  Similar appearance of the colostomy to the prior exam.  Drainage catheter within the superior posterior aspect of left- sided abdominal - pelvic abscess.  Slight increase in size of abscess when compared to the prior exam.  The proximal aspect of the mid to distal descending colon anastomoses is contained within the abscess collection.  Leak at this level cannot be excluded.  Slight increase in size of  left-sided pleural effusion.  Bibasilar subsegmental atelectasis.  Ventral incision healing by secondary intent.  Poorly functioning kidneys.  Under distended thickened bladder slightly asymmetric with thickening greater superiorly and on the left.  This has been made a PRA call report utilizing dashboard call feature.   Original Report Authenticated By: Fuller Canada, M.D.    Current meds: Lovenox and 60/D., Protonix 40/D. IV, Zosyn 3.375 3 times a day, one half normal saline with 20 KCl at 100 cc per hour  ROS- no angina, no claudication, no melena, no hematochezia, no gross hematuria, no renal colic, no decreased force of urinary stream, no nocturia, no doe, no orthopnea, no purulent sputum, no hemoptysis, no weight gain or weight loss, no cold or heat intolerance.  Blood pressure 110/70, pulse 91, temperature 98.6 F (37 C), temperature source Oral, resp. rate 18, height 5\' 10"  (1.778 m), weight 127.7 kg (281 lb 8.4 oz), SpO2 97.00%. Generally-awake alert oriented x 3 Nose, mouth, pharynx-edentulous, moist mucous membranes Chest-clear Heart-regular rate and rhythm, no rub Abdomen-midline incision covered, colostomy left abdomen, drain in left upper quadrant, mild tenderness, no rebound. GU-uncircumcised penis, testes descended bilaterally Extremities-no rash, no edema, no arthritis Neuro-right-handed, strength equal, sensation intact  Lab: Hemoglobin 10.7, WBC 9900, platelets 465K, 3% eosinophils Sodium 131, potassium 4.2, chloride 99, CO2 21, BUN 17, CR 2.55, calcium 7.9, albumin 1.8, phosphorus 2.8, magnesium 2.3 Culture of drained fluid-preliminary (gram-negative rods on Gram stain) Urinalysis-pending (requested 12 noon).  UA on admission SG 1.024, pH 5, 1+ protein, 2+ ketones, 7-10 WBC, 3-6 RBC  CT scan (with contrast) done earlier today showed increasing size of abscess in left upper abdomen. Also noted was under distended thickened bladder slightly asymmetric with thickening  greater superiorly and on the left"   Assessment/Plan: 1. Acute renal failure (? Etiology)-he received 80 cc radiocontrast this morning, but his creatinine was already elevated at 2.3 prior that procedure. He gives past history of urinary retention (seen by [name] in the past) and past medical history notes "urinary anastomotic stricture-hard to be cathed; urology did the last time." If renal function continues to decline, Foley catheter (placed by urology) should be done. Urinalysis will help to see whether any evidence of pyuria or hematuria (only 3% peripheral eosinophils). I suspect creatinine will worsen because of the radiocontrast given this morning at CT scan. If fever continues then antibiotic coverage will need to be broadened and enlarging abscess may need further drainage. Check vancomycin level again in a.m. 2. Perforated diverticulum/S./P. colectomy, colostomy and drain placement-per surgery. With abscess increasing in size he may need further drainage especially with fever 100.8 last night 3. Past history urinary retention with? Urethral stricture-see 1 above  Will follow  [name] F 02/02/2012, 1:39 PM

## 2012-02-02 NOTE — Progress Notes (Signed)
16 Days Post-Op  Subjective: Pt without new c/o  Objective: Vital signs in last 24 hours: Temp:  [98.6 F (37 C)-100.8 F (38.2 C)] 98.6 F (37 C) (10/17 0851) Pulse Rate:  [91-99] 91  (10/17 0851) Resp:  [18-20] 18  (10/17 0851) BP: (110-129)/(69-82) 110/70 mmHg (10/17 0851) SpO2:  [96 %-97 %] 97 % (10/17 0851) Weight:  [281 lb 8.4 oz (127.7 kg)-284 lb 13.4 oz (129.2 kg)] 281 lb 8.4 oz (127.7 kg) (10/17 0500) Last BM Date: 02/02/12  Intake/Output from previous day: 10/16 0701 - 10/17 0700 In: 1700 [P.O.:960; IV Piggyback:650] Out: 1630 [Urine:1330; Stool:300] Intake/Output this shift: Total I/O In: 275 [P.O.:240; I.V.:30; Other:5] Out: 0   Left abd drain intact, insertion site ok, output 410 cc' s 10/16; about 40 cc's in bag now; drain flushed with 10 cc's sterile NS and additional 50 cc's light brown, foul smelling fluid/stool aspirated back (similar in appearance to fluid in ostomy)   Lab Results:   [facility] 02/01/12 0455  WBC 9.9  HGB 10.7*  HCT 32.4*  PLT 465*   BMET  Basename 02/02/12 0810 02/02/12 0545  NA 131* 130*  K 4.2 4.2  CL 99 98  CO2 21 21  GLUCOSE 103* 117*  BUN 17 16  CREATININE 2.55* 2.29*  CALCIUM 7.9* 7.8*   PT/INR No results found for this basename: LABPROT:2,INR:2 in the last 72 hours ABG No results found for this basename: PHART:2,PCO2:2,PO2:2,HCO3:2 in the last 72 hours Results for orders placed during the hospital encounter of 01/16/12  SURGICAL PCR SCREEN     Status: Normal   Collection Time   01/17/12  8:28 AM      Component Value Range Status Comment   MRSA, PCR NEGATIVE  NEGATIVE Final    Staphylococcus aureus NEGATIVE  NEGATIVE Final   CULTURE, BLOOD (ROUTINE X 2)     Status: Normal   Collection Time   01/19/12 10:46 AM      Component Value Range Status Comment   Specimen Description BLOOD LEFT ARM   Final    Special Requests BOTTLES DRAWN AEROBIC AND ANAEROBIC 10CC   Final    Culture  Setup Time 01/19/2012 16:55   Final    Culture NO GROWTH 5 DAYS   Final    Report Status 01/25/2012 FINAL   Final   CULTURE, BLOOD (ROUTINE X 2)     Status: Normal   Collection Time   01/19/12 10:48 AM      Component Value Range Status Comment   Specimen Description BLOOD LEFT ARM   Final    Special Requests BOTTLES DRAWN AEROBIC AND ANAEROBIC 10CC   Final    Culture  Setup Time 01/19/2012 16:55   Final    Culture NO GROWTH 5 DAYS   Final    Report Status 01/25/2012 FINAL   Final   CULTURE, ROUTINE-ABSCESS     Status: Normal   Collection Time   01/24/12  1:10 PM      Component Value Range Status Comment   Specimen Description PERITONEAL CAVITY   Final    Special Requests NONE   Final    Gram Stain     Final    Value: FEW WBC PRESENT, PREDOMINANTLY PMN     NO SQUAMOUS EPITHELIAL CELLS SEEN     ABUNDANT GRAM NEGATIVE RODS     ABUNDANT GRAM POSITIVE RODS     FEW GRAM VARIABLE ROD   Culture ABUNDANT ENTEROCOCCUS SPECIES   Final  Report Status 01/27/2012 FINAL   Final    Organism ID, Bacteria ENTEROCOCCUS SPECIES   Final   CULTURE, ROUTINE-ABSCESS     Status: Normal   Collection Time   01/24/12  2:07 PM      Component Value Range Status Comment   Specimen Description PERITONEAL CAVITY   Final    Special Requests NONE   Final    Gram Stain     Final    Value: RARE WBC PRESENT, PREDOMINANTLY PMN     NO SQUAMOUS EPITHELIAL CELLS SEEN     ABUNDANT GRAM POSITIVE RODS     MODERATE GRAM NEGATIVE RODS     MODERATE GRAM VARIABLE ROD   Culture ABUNDANT ENTEROCOCCUS SPECIES   Final    Report Status 01/27/2012 FINAL   Final    Organism ID, Bacteria ENTEROCOCCUS SPECIES   Final   BODY FLUID CULTURE     Status: Normal (Preliminary result)   Collection Time   01/31/12  5:41 PM      Component Value Range Status Comment   Specimen Description ABSCESS   Final    Special Requests Normal   Final    Gram Stain     Final    Value: ABUNDANT WBC PRESENT,BOTH PMN AND MONONUCLEAR     ABUNDANT GRAM NEGATIVE RODS     Gram Stain Report  Called to,Read Back By and Verified With: Gram Stain Report Called to,Read Back By and Verified With: CLARISSA STATEN RN 02/01/12 8:15AM BY MILSH   Culture NO GROWTH   Final    Report Status PENDING   Incomplete     Studies/Results: Ct Abdomen Pelvis W Contrast  02/02/2012  *RADIOLOGY REPORT*  Clinical Data: Post partial colectomy/colostomy.  Left abscess drain in place.  Abdominal pain.  CT ABDOMEN AND PELVIS WITH CONTRAST  Technique:  Multidetector CT imaging of the abdomen and pelvis was performed following the standard protocol during bolus administration of intravenous contrast.  Contrast: 80mL OMNIPAQUE IOHEXOL 300 MG/ML  SOLN  Comparison: Most recent examination 01/27/2012.  Findings: Post resection distal transverse colon down proximal descending colon with colostomy formation left upper quadrant. Similar appearance of the colostomy to the prior exam.  Within the left aspect of the abdomen and pelvis, there is a complex fluid and air collection/abscess.  Drainage catheter within the superior posterior aspect of this abscess.  Abscess has increased  in size when compared to the prior examination.  The proximal aspect of the mid to distal descending colon anastomoses is contained within the abscess collection.  Leak at this level cannot be excluded.  Slight increase in size of left-sided pleural effusion.  Bibasilar subsegmental atelectasis.  Ventral incision healing by secondary intent.  Fatty liver without focal hepatic lesion.  Gallbladder sludge without calcified gallstone.  Poorly functioning kidneys without focal hepatic, splenic, pancreatic or adrenal lesion.  Scattered small lymph nodes mesenteric region.  Under distended thickened bladder slightly asymmetric with thickening greater superiorly and on the left.  Degenerative changes with calcified disc protrusions lower thoracic and lumbar spine.  IMPRESSION: Post resection distal transverse colon down proximal descending colon with colostomy  formation left upper quadrant.  Similar appearance of the colostomy to the prior exam.  Drainage catheter within the superior posterior aspect of left- sided abdominal - pelvic abscess.  Slight increase in size of abscess when compared to the prior exam.  The proximal aspect of the mid to distal descending colon anastomoses is contained within the abscess collection.  Leak at  this level cannot be excluded.  Slight increase in size of left-sided pleural effusion.  Bibasilar subsegmental atelectasis.  Ventral incision healing by secondary intent.  Poorly functioning kidneys.  Under distended thickened bladder slightly asymmetric with thickening greater superiorly and on the left.  This has been made a PRA call report utilizing dashboard call feature.   Original Report Authenticated By: Fuller Canada, M.D.     Anti-infectives: Anti-infectives     Start     Dose/Rate Route Frequency Ordered Stop   02/01/12 2300   vancomycin (VANCOCIN) 1,500 mg in sodium chloride 0.9 % 500 mL IVPB  Status:  Discontinued        1,500 mg 250 mL/hr over 120 Minutes Intravenous Every 12 hours 02/01/12 1355 02/02/12 0924   02/01/12 0800   piperacillin-tazobactam (ZOSYN) IVPB 3.375 g        3.375 g 12.5 mL/hr over 240 Minutes Intravenous Every 8 hours 02/01/12 0724     01/31/12 1000   ciprofloxacin (CIPRO) tablet 500 mg  Status:  Discontinued        500 mg Oral 2 times daily 01/31/12 0950 02/01/12 0724   01/28/12 2300   vancomycin (VANCOCIN) 2,000 mg in sodium chloride 0.9 % 500 mL IVPB  Status:  Discontinued        2,000 mg 250 mL/hr over 120 Minutes Intravenous Every 12 hours 01/27/12 2239 01/27/12 2243   01/28/12 1200   vancomycin (VANCOCIN) 2,000 mg in sodium chloride 0.9 % 500 mL IVPB  Status:  Discontinued        2,000 mg 250 mL/hr over 120 Minutes Intravenous Every 12 hours 01/27/12 2233 01/27/12 2238   01/27/12 2300   vancomycin (VANCOCIN) 750 mg in sodium chloride 0.9 % 150 mL IVPB  Status:  Discontinued          750 mg 150 mL/hr over 60 Minutes Intravenous  Once 01/27/12 2232 01/27/12 2233   01/27/12 2300   vancomycin (VANCOCIN) 2,000 mg in sodium chloride 0.9 % 500 mL IVPB  Status:  Discontinued        2,000 mg 250 mL/hr over 120 Minutes Intravenous Every 12 hours 01/27/12 2244 02/01/12 1355   01/25/12 1045   vancomycin (VANCOCIN) 1,250 mg in sodium chloride 0.9 % 250 mL IVPB  Status:  Discontinued        1,250 mg 166.7 mL/hr over 90 Minutes Intravenous 2 times daily 01/25/12 1020 01/27/12 2237   01/23/12 1300   micafungin (MYCAMINE) 100 mg in sodium chloride 0.9 % 100 mL IVPB        100 mg 100 mL/hr over 1 Hours Intravenous Daily 01/23/12 1158 01/30/12 1226   01/16/12 1800   piperacillin-tazobactam (ZOSYN) IVPB 3.375 g        3.375 g 12.5 mL/hr over 240 Minutes Intravenous 3 times per day 01/16/12 1721 01/27/12 1012          Assessment/Plan: s/p left abd abscess drainage 10/8; renal consult for worsening renal fxn; cont drainage cath and irrigation; will need drain injection once output declines to r/o fistula   LOS: 17 days    Naseem Varden,D [facility] 02/02/2012

## 2012-02-02 NOTE — Progress Notes (Signed)
ANTIBIOTIC CONSULT NOTE - FOLLOW UP  Pharmacy Consult for Vancomycin Indication: sepsis  No Known Allergies  Patient Measurements: Height: 5\' 10"  (177.8 cm) Weight: 281 lb 8.4 oz (127.7 kg) IBW/kg (Calculated) : 73   Vital Signs: Temp: 98.6 F (37 C) (10/17 0851) Temp src: Oral (10/17 0851) BP: 110/70 mmHg (10/17 0851) Pulse Rate: 91  (10/17 0851) Intake/Output from previous day: 10/16 0701 - 10/17 0700 In: 1700 [P.O.:960; IV Piggyback:650] Out: 1630 [Urine:1330; Stool:300] Intake/Output from this shift: Total I/O In: 20 [I.V.:20] Out: -   Labs:  Basename 02/02/12 0810 02/02/12 0545 02/01/12 0455  WBC -- -- 9.9  HGB -- -- 10.7*  PLT -- -- 465*  LABCREA -- -- --  CREATININE 2.55* 2.29* 0.95   Estimated Creatinine Clearance: 51.7 ml/min (by C-G formula based on Cr of 2.55).   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 CT on 10/7 with large fluid collection, percutaneous drain placed 10/8.  Patient transferred back to floor 10/13.  D#9 Vancomycing for abundant enterococcus (amp resist) that grew on peritoneal fluid culture. Per CCM note, will need 14 days min of vanc.   Vancomycin dose adjusted 10/11 following low VT of 6.8 on 1250 mg BID dosing.   Cipro changed to Zosyn 10/16, appears drain contents were not sent for cx last week and surgery noting sweet smell and some green-blue drainage c/w pseudomonas.  Vancomycin trough 10/16 = 21.9 mcg/ml, drawn 3.5hrs late so true trough little higher  SCr has jumped up to 2.55 this morning, Still making urine. Suspect not prerenal as BUN only 17.   Goal of Therapy:  Vancomycin trough level  15-20 mcg/ml  Plan:  - Day #9 vancomycin. Hold vancomycin doses for now and check random level with AML - Zosyn dose remains appropriate - Lasix has been stopped by CCS  Zeigler, Tad Moore 02/02/2012,9:21 AM

## 2012-02-02 NOTE — Progress Notes (Addendum)
Physical Therapy Treatment Patient Details Name: [name] MRN: [id number] DOB: 1970/10/31 Today's Date: 02/02/2012 Time: 4098-1191 PT Time Calculation (min): 23 min  PT Assessment / Plan / Recommendation Comments on Treatment Session  Pt able to increase ambulation distance this session. continues to demonstrate general weakness, decreased activity tolerance. Encouraged pt to ambulate 3-4x/day if tolerated, with supervision.     Follow Up Recommendations  Home Health PT with 24 hour supervision initially     Does the patient have the potential to tolerate intense rehabilitation     Barriers to Discharge        Equipment Recommendations  Rolling walker with 5" wheels;3 in 1 bedside comode    Recommendations for Other Services OT consult  Frequency Min 3X/week   Plan Discharge plan remains appropriate    Precautions / Restrictions Precautions Precaution Comments: ABD binder, drain, colostomy Restrictions Weight Bearing Restrictions: No   Pertinent Vitals/Pain Abdominal pain    Mobility  Bed Mobility Bed Mobility: Supine to Sit;Sit to Supine Supine to Sit: 3: Mod assist;HOB elevated; with rail Sit to Supine: 3: Mod assist;HOB elevated;With rail Details for Bed Mobility Assistance: Increased time. VCs safety, technique, hand placement. Assist for trunk to upright and LEs onto bed. Disccused need to practice rolling for bed mobility.  Transfers Transfers: Sit to Stand;Stand to Sit Sit to Stand: 4: Min assist;From bed;With upper extremity assist Stand to Sit: 4: Min assist;To bed;With upper extremity assist Details for Transfer Assistance: VCs hand placement. Assist to rise, control descent.  Ambulation/Gait Ambulation/Gait Assistance: 4: Min assist Ambulation Distance (Feet): 500 Feet Assistive device: Straight cane Ambulation/Gait Assistance Details: Pt reports using cane to walk on yesterday. Intermittent unsteadiness. Pt occasionally holding onto handrail in  addition to cane. 2 standing rest breaks. Pt's tolerance is improving but still limited. Dyspnea  2-3/4 with ambulation.  Gait Pattern: Wide base of support;Decreased stride length    Exercises     PT Diagnosis:    PT Problem List:   PT Treatment Interventions:     PT Goals Acute Rehab PT Goals Pt will go Supine/Side to Sit: with min assist PT Goal: Supine/Side to Sit - Progress: Progressing toward goal Pt will go Sit to Supine/Side: with supervision PT Goal: Sit to Supine/Side - Progress: Progressing toward goal Pt will go Sit to Stand: with supervision PT Goal: Sit to Stand - Progress: Progressing toward goal Pt will go Stand to Sit: with supervision PT Goal: Stand to Sit - Progress: Progressing toward goal Pt will Ambulate: >150 feet;with supervision PT Goal: Ambulate - Progress: Progressing toward goal  Visit Information  Last PT Received On: 02/02/12 Assistance Needed: +1    Subjective Data  Subjective: "I'm more tired today than yesterday" Patient Stated Goal: Home   Cognition  Overall Cognitive Status: Appears within functional limits for tasks assessed/performed Arousal/Alertness: Awake/alert Orientation Level: Appears intact for tasks assessed Behavior During Session: [facility] for tasks performed    Balance  Balance Balance Assessed: Yes Static Standing Balance Static Standing - Balance Support: Right upper extremity supported;Left upper extremity supported Static Standing - Level of Assistance: 5: Stand by assistance Static Standing - Comment/# of Minutes: Stood statically at sink while brushing teeth 2-3 minutes-no lob.   End of Session PT - End of Session Activity Tolerance: Patient limited by fatigue Patient left: in bed;with call bell/phone within reach;with family/visitor present   GP     [name] [facility] 02/02/2012, 11:57 AM [phone]

## 2012-02-02 NOTE — Progress Notes (Signed)
Patient interviewed and examined, agree with PA note above.  He has no specific complaints his abdomen remains benign.  He however has developed acute renal insufficiency. Appreciate renal consult by Dr. Caryn Section. He is being hydrated and urine output remains good. He has had persistent abscess in the left lower quadrant by CT today but it appears well drained. I will discuss with IR about possibly upsizing the drain. Mariella Saa MD, FACS  02/02/2012 5:30 PM

## 2012-02-03 ENCOUNTER — Inpatient Hospital Stay (HOSPITAL_COMMUNITY): Payer: Medicaid Other

## 2012-02-03 DIAGNOSIS — N179 Acute kidney failure, unspecified: Secondary | ICD-10-CM

## 2012-02-03 LAB — BASIC METABOLIC PANEL
Calcium: 8 mg/dL — ABNORMAL LOW (ref 8.4–10.5)
GFR calc Af Amer: 17 mL/min — ABNORMAL LOW (ref >90)
GFR calc non Af Amer: 15 mL/min — ABNORMAL LOW (ref >90)
Glucose, Bld: 118 mg/dL — ABNORMAL HIGH (ref 70–99)
Potassium: 4.8 mEq/L (ref 3.5–5.1)
Sodium: 127 mEq/L — ABNORMAL LOW (ref 135–145)

## 2012-02-03 LAB — BODY FLUID CULTURE: Special Requests: NORMAL

## 2012-02-03 LAB — PHOSPHORUS: Phosphorus: 4.7 mg/dL — ABNORMAL HIGH (ref 2.3–4.6)

## 2012-02-03 LAB — MAGNESIUM: Magnesium: 1.9 mg/dL (ref 1.5–2.5)

## 2012-02-03 MED ORDER — SODIUM CHLORIDE 0.9 % IV SOLN
INTRAVENOUS | Status: DC
  Administered 2012-02-03 – 2012-02-06 (×8): via INTRAVENOUS

## 2012-02-03 MED ORDER — MIDAZOLAM HCL 2 MG/2ML IJ SOLN
INTRAMUSCULAR | Status: AC | PRN
  Administered 2012-02-03 (×3): 1 mg via INTRAVENOUS

## 2012-02-03 MED ORDER — PIPERACILLIN-TAZOBACTAM IN DEX 2-0.25 GM/50ML IV SOLN
2.2500 g | Freq: Four times a day (QID) | INTRAVENOUS | Status: DC
  Administered 2012-02-03 – 2012-02-06 (×11): 2.25 g via INTRAVENOUS
  Filled 2012-02-03 (×12): qty 50

## 2012-02-03 MED ORDER — FENTANYL CITRATE 0.05 MG/ML IJ SOLN
INTRAMUSCULAR | Status: AC | PRN
  Administered 2012-02-03: 50 ug via INTRAVENOUS
  Administered 2012-02-03: 100 ug via INTRAVENOUS

## 2012-02-03 MED ORDER — MIDAZOLAM HCL 2 MG/2ML IJ SOLN
INTRAMUSCULAR | Status: AC
  Filled 2012-02-03: qty 2

## 2012-02-03 MED ORDER — LIDOCAINE HCL 1 % IJ SOLN
INTRAMUSCULAR | Status: AC
  Filled 2012-02-03: qty 20

## 2012-02-03 MED ORDER — ENOXAPARIN SODIUM 30 MG/0.3ML ~~LOC~~ SOLN
30.0000 mg | SUBCUTANEOUS | Status: DC
  Administered 2012-02-03 – 2012-02-12 (×10): 30 mg via SUBCUTANEOUS
  Filled 2012-02-03 (×11): qty 0.3

## 2012-02-03 MED ORDER — SODIUM CHLORIDE 0.9 % IV SOLN
INTRAVENOUS | Status: DC
  Filled 2012-02-03: qty 1000

## 2012-02-03 MED ORDER — ENOXAPARIN SODIUM 60 MG/0.6ML ~~LOC~~ SOLN
60.0000 mg | SUBCUTANEOUS | Status: DC
  Filled 2012-02-03: qty 0.6

## 2012-02-03 NOTE — Progress Notes (Signed)
Patient ID: [name], male   DOB: January 09, 1971, 41 y.o.   MRN: [id number] 17 Days Post-Op  Subjective: Pt feels ok today.  Ate solid food (peaches) for the first time this morning.  Having difficulty urinating.  He has no urge basically to urinate.  Last time he voided was around 0200am and he was made to get up and void.  He only voided about 200cc at max.  Patient has not voided since then and still has no desire to void.  Objective: Vital signs in last 24 hours: Temp:  [98 F (36.7 C)-98.9 F (37.2 C)] 98 F (36.7 C) (10/18 0606) Pulse Rate:  [94-99] 98  (10/18 0606) Resp:  [18] 18  (10/18 0606) BP: (116-133)/(70-84) 133/84 mmHg (10/18 0606) SpO2:  [97 %-98 %] 98 % (10/18 0606) Last BM Date: 02/02/12  Intake/Output from previous day: 10/17 0701 - 10/18 0700 In: 1270 [P.O.:480; I.V.:780] Out: 1890 [Urine:1175; Drains:40; Stool:675] Intake/Output this shift:    PE: Abd: soft, stable, ostomy with some output, drain with minimal brown, purulent appearing drainage.  Wound is stable and packed.    Lab Results:   [facility] 02/01/12 0455  WBC 9.9  HGB 10.7*  HCT 32.4*  PLT 465*   BMET  Basename 02/03/12 0605 02/02/12 0810  NA 127* 131*  K 4.8 4.2  CL 95* 99  CO2 20 21  GLUCOSE 118* 103*  BUN 26* 17  CREATININE 4.48* 2.55*  CALCIUM 8.0* 7.9*   PT/INR No results found for this basename: LABPROT:2,INR:2 in the last 72 hours CMP     Component Value Date/Time   NA 127* 02/03/2012 0605   K 4.8 02/03/2012 0605   CL 95* 02/03/2012 0605   CO2 20 02/03/2012 0605   GLUCOSE 118* 02/03/2012 0605   BUN 26* 02/03/2012 0605   CREATININE 4.48* 02/03/2012 0605   CALCIUM 8.0* 02/03/2012 0605   PROT 6.0 01/30/2012 0629   ALBUMIN 1.8* 01/30/2012 0629   AST 49* 01/30/2012 0629   ALT 89* 01/30/2012 0629   ALKPHOS 70 01/30/2012 0629   BILITOT 0.2* 01/30/2012 0629   GFRNONAA 15* 02/03/2012 0605   GFRAA 17* 02/03/2012 0605   Lipase     Component Value Date/Time   LIPASE  24 01/16/2012 1210       Studies/Results: Ct Abdomen Pelvis W Contrast  02/02/2012  *RADIOLOGY REPORT*  Clinical Data: Post partial colectomy/colostomy.  Left abscess drain in place.  Abdominal pain.  CT ABDOMEN AND PELVIS WITH CONTRAST  Technique:  Multidetector CT imaging of the abdomen and pelvis was performed following the standard protocol during bolus administration of intravenous contrast.  Contrast: 80mL OMNIPAQUE IOHEXOL 300 MG/ML  SOLN  Comparison: Most recent examination 01/27/2012.  Findings: Post resection distal transverse colon down proximal descending colon with colostomy formation left upper quadrant. Similar appearance of the colostomy to the prior exam.  Within the left aspect of the abdomen and pelvis, there is a complex fluid and air collection/abscess.  Drainage catheter within the superior posterior aspect of this abscess.  Abscess has increased  in size when compared to the prior examination.  The proximal aspect of the mid to distal descending colon anastomoses is contained within the abscess collection.  Leak at this level cannot be excluded.  Slight increase in size of left-sided pleural effusion.  Bibasilar subsegmental atelectasis.  Ventral incision healing by secondary intent.  Fatty liver without focal hepatic lesion.  Gallbladder sludge without calcified gallstone.  Poorly functioning kidneys without focal hepatic,  splenic, pancreatic or adrenal lesion.  Scattered small lymph nodes mesenteric region.  Under distended thickened bladder slightly asymmetric with thickening greater superiorly and on the left.  Degenerative changes with calcified disc protrusions lower thoracic and lumbar spine.  IMPRESSION: Post resection distal transverse colon down proximal descending colon with colostomy formation left upper quadrant.  Similar appearance of the colostomy to the prior exam.  Drainage catheter within the superior posterior aspect of left- sided abdominal - pelvic abscess.  Slight  increase in size of abscess when compared to the prior exam.  The proximal aspect of the mid to distal descending colon anastomoses is contained within the abscess collection.  Leak at this level cannot be excluded.  Slight increase in size of left-sided pleural effusion.  Bibasilar subsegmental atelectasis.  Ventral incision healing by secondary intent.  Poorly functioning kidneys.  Under distended thickened bladder slightly asymmetric with thickening greater superiorly and on the left.  This has been made a PRA call report utilizing dashboard call feature.   Original Report Authenticated By: Fuller Canada, M.D.     Anti-infectives: Anti-infectives     Start     Dose/Rate Route Frequency Ordered Stop   02/03/12 1800  piperacillin-tazobactam (ZOSYN) IVPB 2.25 g       2.25 g 100 mL/hr over 30 Minutes Intravenous 4 times per day 02/03/12 0924     02/01/12 2300   vancomycin (VANCOCIN) 1,500 mg in sodium chloride 0.9 % 500 mL IVPB  Status:  Discontinued        1,500 mg 250 mL/hr over 120 Minutes Intravenous Every 12 hours 02/01/12 1355 02/02/12 0924   02/01/12 0800   piperacillin-tazobactam (ZOSYN) IVPB 3.375 g  Status:  Discontinued        3.375 g 12.5 mL/hr over 240 Minutes Intravenous Every 8 hours 02/01/12 0724 02/03/12 0924   01/31/12 1000   ciprofloxacin (CIPRO) tablet 500 mg  Status:  Discontinued        500 mg Oral 2 times daily 01/31/12 0950 02/01/12 0724   01/28/12 2300   vancomycin (VANCOCIN) 2,000 mg in sodium chloride 0.9 % 500 mL IVPB  Status:  Discontinued        2,000 mg 250 mL/hr over 120 Minutes Intravenous Every 12 hours 01/27/12 2239 01/27/12 2243   01/28/12 1200   vancomycin (VANCOCIN) 2,000 mg in sodium chloride 0.9 % 500 mL IVPB  Status:  Discontinued        2,000 mg 250 mL/hr over 120 Minutes Intravenous Every 12 hours 01/27/12 2233 01/27/12 2238   01/27/12 2300   vancomycin (VANCOCIN) 750 mg in sodium chloride 0.9 % 150 mL IVPB  Status:  Discontinued        750  mg 150 mL/hr over 60 Minutes Intravenous  Once 01/27/12 2232 01/27/12 2233   01/27/12 2300   vancomycin (VANCOCIN) 2,000 mg in sodium chloride 0.9 % 500 mL IVPB  Status:  Discontinued        2,000 mg 250 mL/hr over 120 Minutes Intravenous Every 12 hours 01/27/12 2244 02/01/12 1355   01/25/12 1045   vancomycin (VANCOCIN) 1,250 mg in sodium chloride 0.9 % 250 mL IVPB  Status:  Discontinued        1,250 mg 166.7 mL/hr over 90 Minutes Intravenous 2 times daily 01/25/12 1020 01/27/12 2237   01/23/12 1300   micafungin (MYCAMINE) 100 mg in sodium chloride 0.9 % 100 mL IVPB        100 mg 100 mL/hr over 1 Hours  Intravenous Daily 01/23/12 1158 01/30/12 1226   01/16/12 1800  piperacillin-tazobactam (ZOSYN) IVPB 3.375 g       3.375 g 12.5 mL/hr over 240 Minutes Intravenous 3 times per day 01/16/12 1721 01/27/12 1012           Assessment/Plan  1. S/p ex lap with Hartman's for perf tics 2. Likely leak of rectal stump causing intra-abdominal fluid collection 3. Wound dehiscence but no evisceration 4. ARF, decreasing UOP 5. Hyponatremia 6. Deconditioning, improving  Plan: 1. Vanc has been stopped.  Zosyn has been decreased to a renal dose. 2. Will insert foley catheter today based on nephrology's note from yesterday if creatinine was increasing, as well as his UOP is decreasing. 3. I have adjusted his fluids to just NS as his Na+ is low.  His K+ also went up and given decrease in kidney function, I have taken K+ out of his fluids.  4. I have spoken with IR to see if they can upsize his drain or place another drain to help drain this enlarging fluid collection. 5. Cont to encourage diet as able along with mobilization.  LOS: 18 days    Allon Costlow E 02/03/2012, 9:52 AM Pager: [phone]

## 2012-02-03 NOTE — Progress Notes (Signed)
Patient interviewed and examined, agree with PA note above.  Just back from IR where a second drain was placed and a large amount of purulent material drained. He is comfortable and stoma is functioning.  Urine output is much better after Foley placement.  Mariella Saa MD, FACS  02/03/2012 8:01 PM

## 2012-02-03 NOTE — Consult Note (Signed)
WOC ostomy follow up  Requested per primary WOC to evaluate pouching system placed yesterday Stoma type/location: Oval opening in skin measures 1 and 3/4 inches by 1 and 1/8 inch. Stoma viable, but recessed below skin level approximately 3cm. Output liquid brown with flatus in pouch, system intact without evidence of leaking Ostomy pouching: 1pc. With convexity and addition of 2" barrier rings for additional soft convexity..  Education provided: reminded pt and family at bedside that will need to emptied when 1/3 to 1/2 full. Pt to be going to IR for another procedure later today and is currently NPO, so output may be down.  WOC team will follow  Leialoha Hanna Eliberto Ivory RN,CWOCN [phone]

## 2012-02-03 NOTE — Progress Notes (Signed)
17 Days Post-Op  Subjective: Pt doing ok; no new c/o  Objective: Vital signs in last 24 hours: Temp:  [98 F (36.7 C)-98.9 F (37.2 C)] 98 F (36.7 C) (10/18 0606) Pulse Rate:  [94-99] 98  (10/18 0606) Resp:  [18] 18  (10/18 0606) BP: (116-133)/(70-84) 133/84 mmHg (10/18 0606) SpO2:  [97 %-98 %] 98 % (10/18 0606) Last BM Date: 02/02/12  Intake/Output from previous day: 10/17 0701 - 10/18 0700 In: 1270 [P.O.:480; I.V.:780] Out: 1890 [Urine:1175; Drains:40; Stool:675] Intake/Output this shift: Total I/O In: -  Out: 250 [Urine:250] Chest- dim BS bases; heart-RRR; abd- dist, soft, few BS Left abd drain intact, output 40 cc's today brown fluid/liquid stool, cx's- klebsiella  Lab Results:   [facility] 02/01/12 0455  WBC 9.9  HGB 10.7*  HCT 32.4*  PLT 465*   BMET  Basename 02/03/12 0605 02/02/12 0810  NA 127* 131*  K 4.8 4.2  CL 95* 99  CO2 20 21  GLUCOSE 118* 103*  BUN 26* 17  CREATININE 4.48* 2.55*  CALCIUM 8.0* 7.9*   PT/INR No results found for this basename: LABPROT:2,INR:2 in the last 72 hours ABG No results found for this basename: PHART:2,PCO2:2,PO2:2,HCO3:2 in the last 72 hours Results for orders placed during the hospital encounter of 01/16/12  SURGICAL PCR SCREEN     Status: Normal   Collection Time   01/17/12  8:28 AM      Component Value Range Status Comment   MRSA, PCR NEGATIVE  NEGATIVE Final    Staphylococcus aureus NEGATIVE  NEGATIVE Final   CULTURE, BLOOD (ROUTINE X 2)     Status: Normal   Collection Time   01/19/12 10:46 AM      Component Value Range Status Comment   Specimen Description BLOOD LEFT ARM   Final    Special Requests BOTTLES DRAWN AEROBIC AND ANAEROBIC 10CC   Final    Culture  Setup Time 01/19/2012 16:55   Final    Culture NO GROWTH 5 DAYS   Final    Report Status 01/25/2012 FINAL   Final   CULTURE, BLOOD (ROUTINE X 2)     Status: Normal   Collection Time   01/19/12 10:48 AM      Component Value Range Status Comment   Specimen  Description BLOOD LEFT ARM   Final    Special Requests BOTTLES DRAWN AEROBIC AND ANAEROBIC 10CC   Final    Culture  Setup Time 01/19/2012 16:55   Final    Culture NO GROWTH 5 DAYS   Final    Report Status 01/25/2012 FINAL   Final   CULTURE, ROUTINE-ABSCESS     Status: Normal   Collection Time   01/24/12  1:10 PM      Component Value Range Status Comment   Specimen Description PERITONEAL CAVITY   Final    Special Requests NONE   Final    Gram Stain     Final    Value: FEW WBC PRESENT, PREDOMINANTLY PMN     NO SQUAMOUS EPITHELIAL CELLS SEEN     ABUNDANT GRAM NEGATIVE RODS     ABUNDANT GRAM POSITIVE RODS     FEW GRAM VARIABLE ROD   Culture ABUNDANT ENTEROCOCCUS SPECIES   Final    Report Status 01/27/2012 FINAL   Final    Organism ID, Bacteria ENTEROCOCCUS SPECIES   Final   CULTURE, ROUTINE-ABSCESS     Status: Normal   Collection Time   01/24/12  2:07 PM  Component Value Range Status Comment   Specimen Description PERITONEAL CAVITY   Final    Special Requests NONE   Final    Gram Stain     Final    Value: RARE WBC PRESENT, PREDOMINANTLY PMN     NO SQUAMOUS EPITHELIAL CELLS SEEN     ABUNDANT GRAM POSITIVE RODS     MODERATE GRAM NEGATIVE RODS     MODERATE GRAM VARIABLE ROD   Culture ABUNDANT ENTEROCOCCUS SPECIES   Final    Report Status 01/27/2012 FINAL   Final    Organism ID, Bacteria ENTEROCOCCUS SPECIES   Final   BODY FLUID CULTURE     Status: Normal   Collection Time   01/31/12  5:41 PM      Component Value Range Status Comment   Specimen Description ABSCESS   Final    Special Requests Normal   Final    Gram Stain     Final    Value: ABUNDANT WBC PRESENT,BOTH PMN AND MONONUCLEAR     ABUNDANT GRAM NEGATIVE RODS     Gram Stain Report Called to,Read Back By and Verified With: Gram Stain Report Called to,Read Back By and Verified With: CLARISSA STATEN RN 02/01/12 8:15AM BY MILSH   Culture FEW KLEBSIELLA PNEUMONIAE   Final    Report Status 02/03/2012 FINAL   Final     Organism ID, Bacteria KLEBSIELLA PNEUMONIAE   Final    Studies/Results: Ct Abdomen Pelvis W Contrast  02/02/2012  *RADIOLOGY REPORT*  Clinical Data: Post partial colectomy/colostomy.  Left abscess drain in place.  Abdominal pain.  CT ABDOMEN AND PELVIS WITH CONTRAST  Technique:  Multidetector CT imaging of the abdomen and pelvis was performed following the standard protocol during bolus administration of intravenous contrast.  Contrast: 80mL OMNIPAQUE IOHEXOL 300 MG/ML  SOLN  Comparison: Most recent examination 01/27/2012.  Findings: Post resection distal transverse colon down proximal descending colon with colostomy formation left upper quadrant. Similar appearance of the colostomy to the prior exam.  Within the left aspect of the abdomen and pelvis, there is a complex fluid and air collection/abscess.  Drainage catheter within the superior posterior aspect of this abscess.  Abscess has increased  in size when compared to the prior examination.  The proximal aspect of the mid to distal descending colon anastomoses is contained within the abscess collection.  Leak at this level cannot be excluded.  Slight increase in size of left-sided pleural effusion.  Bibasilar subsegmental atelectasis.  Ventral incision healing by secondary intent.  Fatty liver without focal hepatic lesion.  Gallbladder sludge without calcified gallstone.  Poorly functioning kidneys without focal hepatic, splenic, pancreatic or adrenal lesion.  Scattered small lymph nodes mesenteric region.  Under distended thickened bladder slightly asymmetric with thickening greater superiorly and on the left.  Degenerative changes with calcified disc protrusions lower thoracic and lumbar spine.  IMPRESSION: Post resection distal transverse colon down proximal descending colon with colostomy formation left upper quadrant.  Similar appearance of the colostomy to the prior exam.  Drainage catheter within the superior posterior aspect of left- sided  abdominal - pelvic abscess.  Slight increase in size of abscess when compared to the prior exam.  The proximal aspect of the mid to distal descending colon anastomoses is contained within the abscess collection.  Leak at this level cannot be excluded.  Slight increase in size of left-sided pleural effusion.  Bibasilar subsegmental atelectasis.  Ventral incision healing by secondary intent.  Poorly functioning kidneys.  Under distended thickened  bladder slightly asymmetric with thickening greater superiorly and on the left.  This has been made a PRA call report utilizing dashboard call feature.   Original Report Authenticated By: Fuller Canada, M.D.     Anti-infectives: Anti-infectives     Start     Dose/Rate Route Frequency Ordered Stop   02/03/12 1800   piperacillin-tazobactam (ZOSYN) IVPB 2.25 g        2.25 g 100 mL/hr over 30 Minutes Intravenous 4 times per day 02/03/12 0924     02/01/12 2300   vancomycin (VANCOCIN) 1,500 mg in sodium chloride 0.9 % 500 mL IVPB  Status:  Discontinued        1,500 mg 250 mL/hr over 120 Minutes Intravenous Every 12 hours 02/01/12 1355 02/02/12 0924   02/01/12 0800   piperacillin-tazobactam (ZOSYN) IVPB 3.375 g  Status:  Discontinued        3.375 g 12.5 mL/hr over 240 Minutes Intravenous Every 8 hours 02/01/12 0724 02/03/12 0924   01/31/12 1000   ciprofloxacin (CIPRO) tablet 500 mg  Status:  Discontinued        500 mg Oral 2 times daily 01/31/12 0950 02/01/12 0724   01/28/12 2300   vancomycin (VANCOCIN) 2,000 mg in sodium chloride 0.9 % 500 mL IVPB  Status:  Discontinued        2,000 mg 250 mL/hr over 120 Minutes Intravenous Every 12 hours 01/27/12 2239 01/27/12 2243   01/28/12 1200   vancomycin (VANCOCIN) 2,000 mg in sodium chloride 0.9 % 500 mL IVPB  Status:  Discontinued        2,000 mg 250 mL/hr over 120 Minutes Intravenous Every 12 hours 01/27/12 2233 01/27/12 2238   01/27/12 2300   vancomycin (VANCOCIN) 750 mg in sodium chloride 0.9 % 150 mL IVPB   Status:  Discontinued        750 mg 150 mL/hr over 60 Minutes Intravenous  Once 01/27/12 2232 01/27/12 2233   01/27/12 2300   vancomycin (VANCOCIN) 2,000 mg in sodium chloride 0.9 % 500 mL IVPB  Status:  Discontinued        2,000 mg 250 mL/hr over 120 Minutes Intravenous Every 12 hours 01/27/12 2244 02/01/12 1355   01/25/12 1045   vancomycin (VANCOCIN) 1,250 mg in sodium chloride 0.9 % 250 mL IVPB  Status:  Discontinued        1,250 mg 166.7 mL/hr over 90 Minutes Intravenous 2 times daily 01/25/12 1020 01/27/12 2237   01/23/12 1300   micafungin (MYCAMINE) 100 mg in sodium chloride 0.9 % 100 mL IVPB        100 mg 100 mL/hr over 1 Hours Intravenous Daily 01/23/12 1158 01/30/12 1226   01/16/12 1800   piperacillin-tazobactam (ZOSYN) IVPB 3.375 g        3.375 g 12.5 mL/hr over 240 Minutes Intravenous 3 times per day 01/16/12 1721 01/27/12 1012          Assessment/Plan: S/p left abd abscess drainage 10/8 secondary to perf diverticulitis. CCS has requested upsizing of existing abd drain and possible placement of additional abd drain. Case d/w Dr. Lowella Dandy and above d/w pt/family with their understanding and consent. Procedure planned for later today.   LOS: 18 days    Kion Huntsberry,D [facility] 02/03/2012

## 2012-02-03 NOTE — Procedures (Signed)
The existing 12 [place] drain was exchanged for two 12 [place] drains.  One drain was placed within the superior aspect of the collection and the other was placed in the inferior aspect.  Greater than 700 ml of brown fluid was aspirated.  No immediate complication.  See Radiology report.

## 2012-02-03 NOTE — Progress Notes (Addendum)
ANTIBIOTIC CONSULT NOTE - FOLLOW UP  Pharmacy Consult for Vancomycin Indication: sepsis  No Known Allergies  Patient Measurements: Height: 5\' 10"  (177.8 cm) Weight: 281 lb 8.4 oz (127.7 kg) IBW/kg (Calculated) : 73   Vital Signs: Temp: 98 F (36.7 C) (10/18 0606) Temp src: Oral (10/18 0606) BP: 133/84 mmHg (10/18 0606) Pulse Rate: 98  (10/18 0606) Intake/Output from previous day: 10/17 0701 - 10/18 0700 In: 1270 [P.O.:480; I.V.:780] Out: 1890 [Urine:1175; Drains:40; Stool:675] Intake/Output from this shift:    Labs:  Basename 02/03/12 0605 02/02/12 0810 02/02/12 0545 02/01/12 0455  WBC -- -- -- 9.9  HGB -- -- -- 10.7*  PLT -- -- -- 465*  LABCREA -- -- -- --  CREATININE 4.48* 2.55* 2.29* --   Estimated Creatinine Clearance: 29.4 ml/min (by C-G formula based on Cr of 4.48).   Microbiology: 10/1 MRSA PCR negative 10/3 Blood: NGF 10/8 Peritoneal cavity x 2: enterococcus (sensitive vanc, resistant to ampicillin) 10/15 body fluid cx (from left sided drain): K. 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 CT on 10/7 with large fluid collection, percutaneous drain placed 10/8.  Patient transferred back to floor 10/13.  D#10 Vancomycing for abundant enterococcus (amp resist) that grew on peritoneal fluid culture. Per CCM note, will need 14 days min of vanc.   Vancomycin dose adjusted 10/11 following low VT of 6.8 on 1250 mg BID dosing.   Cipro changed to Zosyn 10/16, appears drain contents were not sent for cx last week and surgery noting sweet smell and some green-blue drainage c/w pseudomonas.  Vancomycin trough 10/16 = 21.9 mcg/ml on vanco 2gm IV q12h, drawn 3.5hrs late so true trough little higher  SCr continues to rise, renal consulted, Still making urine. IV contrast given 10/17am for CT.    Random vancomycin level this am = 47.15mcg/ml, last dose (1500mg ) was admin 10/16 at 23:00.   Goal of Therapy:  Vancomycin trough level 15-20 mcg/ml  Plan:  - Day #10 vancomycin. Hold vancomycin for elevated level and worsening ARF - Based on SCr trend, will change zosyn to 2.25gm IV q6h, if SCr improves then can change back - Recheck random vancomycin level in am to evaluate clearance, but will be difficult to calculate half-life d/t changing SCr  Suzette Battiest, Tad Moore 02/03/2012,8:53 AM

## 2012-02-03 NOTE — Progress Notes (Signed)
[facility] ROUNDING NOTE   Subjective:   Interval History:distension no lower extremity swelling  Objective:  Vital signs in last 24 hours:  Temp:  [98 F (36.7 C)-98.9 F (37.2 C)] 98 F (36.7 C) (10/18 0606) Pulse Rate:  [94-99] 98  (10/18 0606) Resp:  [18] 18  (10/18 0606) BP: (116-133)/(70-84) 133/84 mmHg (10/18 0606) SpO2:  [97 %-98 %] 98 % (10/18 0606)  Weight change:  Filed Weights   02/01/12 0610 02/02/12 0211 02/02/12 0500  Weight: 125.5 kg (276 lb 10.8 oz) 129.2 kg (284 lb 13.4 oz) 127.7 kg (281 lb 8.4 oz)    Intake/Output: I/O last 3 completed shifts: In: 2010 [P.O.:480; I.V.:780; Other:100; IV Piggyback:650] Out: 2670 [Urine:1655; Drains:40; Stool:975]   Intake/Output this shift:    Generally-awake alert oriented x 3  Nose, mouth, pharynx-edentulous, moist mucous membranes  Chest-clear  Heart-regular rate and rhythm, no rub  Abdomen-midline incision covered, colostomy left abdomen, drain in left upper quadrant, mild tenderness, no rebound.  Extremities-no rash, no edema, no arthritis  Neuro-right-handed, strength equal, sensation intact    Basic Metabolic Panel:  Lab 02/03/12 1610 02/02/12 0810 02/02/12 0545 02/01/12 0455 01/31/12 0452 01/30/12 0629 01/29/12 0350  NA 127* 131* 130* 133* 136 -- --  K 4.8 4.2 4.2 3.2* 3.4* -- --  CL 95* 99 98 100 104 -- --  CO2 20 21 21 22 23  -- --  GLUCOSE 118* 103* 117* 177* 120* -- --  BUN 26* 17 16 10 8  -- --  CREATININE 4.48* 2.55* 2.29* 0.95 0.70 -- --  CALCIUM 8.0* 7.9* 7.8* -- -- -- --  MG 1.9 -- -- -- -- 2.3 2.2  PHOS 4.7* -- -- -- -- 2.8 2.5    Liver Function Tests:  Lab 01/30/12 0629  AST 49*  ALT 89*  ALKPHOS 70  BILITOT 0.2*  PROT 6.0  ALBUMIN 1.8*   No results found for this basename: LIPASE:5,AMYLASE:5 in the last 168 hours No results found for this basename: AMMONIA:3 in the last 168 hours  CBC:  Lab 02/01/12 0455 01/30/12 0629 01/28/12 0325  WBC 9.9 7.6 7.5  NEUTROABS  -- 4.7 --  HGB 10.7* 10.9* 11.3*  HCT 32.4* 32.6* 35.1*  MCV 92.3 91.6 93.9  PLT 465* 494* 467*    Cardiac Enzymes: No results found for this basename: CKTOTAL:5,CKMB:5,CKMBINDEX:5,TROPONINI:5 in the last 168 hours  BNP: No components found with this basename: POCBNP:5  CBG:  Lab 01/30/12 2024 01/30/12 1202 01/30/12 0735 01/30/12 0438 01/29/12 2344  GLUCAP 147* 184* 145* 171* 147*    Microbiology: Results for orders placed during the hospital encounter of 01/16/12  SURGICAL PCR SCREEN     Status: Normal   Collection Time   01/17/12  8:28 AM      Component Value Range Status Comment   MRSA, PCR NEGATIVE  NEGATIVE Final    Staphylococcus aureus NEGATIVE  NEGATIVE Final   CULTURE, BLOOD (ROUTINE X 2)     Status: Normal   Collection Time   01/19/12 10:46 AM      Component Value Range Status Comment   Specimen Description BLOOD LEFT ARM   Final    Special Requests BOTTLES DRAWN AEROBIC AND ANAEROBIC 10CC   Final    Culture  Setup Time 01/19/2012 16:55   Final    Culture NO GROWTH 5 DAYS   Final    Report Status 01/25/2012 FINAL   Final   CULTURE, BLOOD (ROUTINE X 2)     Status:  Normal   Collection Time   01/19/12 10:48 AM      Component Value Range Status Comment   Specimen Description BLOOD LEFT ARM   Final    Special Requests BOTTLES DRAWN AEROBIC AND ANAEROBIC 10CC   Final    Culture  Setup Time 01/19/2012 16:55   Final    Culture NO GROWTH 5 DAYS   Final    Report Status 01/25/2012 FINAL   Final   CULTURE, ROUTINE-ABSCESS     Status: Normal   Collection Time   01/24/12  1:10 PM      Component Value Range Status Comment   Specimen Description PERITONEAL CAVITY   Final    Special Requests NONE   Final    Gram Stain     Final    Value: FEW WBC PRESENT, PREDOMINANTLY PMN     NO SQUAMOUS EPITHELIAL CELLS SEEN     ABUNDANT GRAM NEGATIVE RODS     ABUNDANT GRAM POSITIVE RODS     FEW GRAM VARIABLE ROD   Culture ABUNDANT ENTEROCOCCUS SPECIES   Final    Report Status  01/27/2012 FINAL   Final    Organism ID, Bacteria ENTEROCOCCUS SPECIES   Final   CULTURE, ROUTINE-ABSCESS     Status: Normal   Collection Time   01/24/12  2:07 PM      Component Value Range Status Comment   Specimen Description PERITONEAL CAVITY   Final    Special Requests NONE   Final    Gram Stain     Final    Value: RARE WBC PRESENT, PREDOMINANTLY PMN     NO SQUAMOUS EPITHELIAL CELLS SEEN     ABUNDANT GRAM POSITIVE RODS     MODERATE GRAM NEGATIVE RODS     MODERATE GRAM VARIABLE ROD   Culture ABUNDANT ENTEROCOCCUS SPECIES   Final    Report Status 01/27/2012 FINAL   Final    Organism ID, Bacteria ENTEROCOCCUS SPECIES   Final   BODY FLUID CULTURE     Status: Normal   Collection Time   01/31/12  5:41 PM      Component Value Range Status Comment   Specimen Description ABSCESS   Final    Special Requests Normal   Final    Gram Stain     Final    Value: ABUNDANT WBC PRESENT,BOTH PMN AND MONONUCLEAR     ABUNDANT GRAM NEGATIVE RODS     Gram Stain Report Called to,Read Back By and Verified With: Gram Stain Report Called to,Read Back By and Verified With: CLARISSA STATEN RN 02/01/12 8:15AM BY MILSH   Culture FEW KLEBSIELLA PNEUMONIAE   Final    Report Status 02/03/2012 FINAL   Final    Organism ID, Bacteria KLEBSIELLA PNEUMONIAE   Final     Coagulation Studies: No results found for this basename: LABPROT:5,INR:5 in the last 72 hours  Urinalysis:  Basename 02/02/12 1919  COLORURINE YELLOW  LABSPEC 1.012  PHURINE 5.5  GLUCOSEU NEGATIVE  HGBUR TRACE*  BILIRUBINUR NEGATIVE  KETONESUR NEGATIVE  PROTEINUR 30*  UROBILINOGEN 0.2  NITRITE NEGATIVE  LEUKOCYTESUR NEGATIVE      Imaging: Ct Abdomen Pelvis W Contrast  02/02/2012  *RADIOLOGY REPORT*  Clinical Data: Post partial colectomy/colostomy.  Left abscess drain in place.  Abdominal pain.  CT ABDOMEN AND PELVIS WITH CONTRAST  Technique:  Multidetector CT imaging of the abdomen and pelvis was performed following the standard  protocol during bolus administration of intravenous contrast.  Contrast: 80mL OMNIPAQUE IOHEXOL 300  MG/ML  SOLN  Comparison: Most recent examination 01/27/2012.  Findings: Post resection distal transverse colon down proximal descending colon with colostomy formation left upper quadrant. Similar appearance of the colostomy to the prior exam.  Within the left aspect of the abdomen and pelvis, there is a complex fluid and air collection/abscess.  Drainage catheter within the superior posterior aspect of this abscess.  Abscess has increased  in size when compared to the prior examination.  The proximal aspect of the mid to distal descending colon anastomoses is contained within the abscess collection.  Leak at this level cannot be excluded.  Slight increase in size of left-sided pleural effusion.  Bibasilar subsegmental atelectasis.  Ventral incision healing by secondary intent.  Fatty liver without focal hepatic lesion.  Gallbladder sludge without calcified gallstone.  Poorly functioning kidneys without focal hepatic, splenic, pancreatic or adrenal lesion.  Scattered small lymph nodes mesenteric region.  Under distended thickened bladder slightly asymmetric with thickening greater superiorly and on the left.  Degenerative changes with calcified disc protrusions lower thoracic and lumbar spine.  IMPRESSION: Post resection distal transverse colon down proximal descending colon with colostomy formation left upper quadrant.  Similar appearance of the colostomy to the prior exam.  Drainage catheter within the superior posterior aspect of left- sided abdominal - pelvic abscess.  Slight increase in size of abscess when compared to the prior exam.  The proximal aspect of the mid to distal descending colon anastomoses is contained within the abscess collection.  Leak at this level cannot be excluded.  Slight increase in size of left-sided pleural effusion.  Bibasilar subsegmental atelectasis.  Ventral incision healing by  secondary intent.  Poorly functioning kidneys.  Under distended thickened bladder slightly asymmetric with thickening greater superiorly and on the left.  This has been made a PRA call report utilizing dashboard call feature.   Original Report Authenticated By: Fuller Canada, M.D.      Medications:      . sodium chloride 0.9 % 1,000 mL infusion    . DISCONTD: dextrose 5 % and 0.45% NaCl 1,000 mL with potassium chloride 20 mEq infusion 100 mL/hr at 02/02/12 2222      . enoxaparin  60 mg Subcutaneous Q24H  . feeding supplement  237 mL Oral BID BM  . pantoprazole (PROTONIX) IV  40 mg Intravenous QHS  . piperacillin-tazobactam (ZOSYN)  IV  2.25 g Intravenous Q6H  . sodium chloride  10-40 mL Intracatheter Q12H  . sodium hypochlorite   Irrigation BID  . DISCONTD: piperacillin-tazobactam (ZOSYN)  IV  3.375 g Intravenous Q8H   acetaminophen (TYLENOL) oral liquid 160 mg/5 mL, albuterol-ipratropium, fentaNYL, hydrALAZINE, LORazepam, magic mouthwash, ondansetron, oxyCODONE-acetaminophen, sodium chloride, DISCONTD: alum & mag hydroxide-simeth  Assessment/ Plan:  1. Acute renal failure -he received 80 cc radiocontrast this morning, but his creatinine was already elevated at 2.3 prior that procedure. Place foley catheter to better monitor output. Worsening renal failure 2. Perforated diverticulum/S./P. colectomy, colostomy and drain placement-per surgery. 3. Past history urinary retention with  Urethral stricture-see 1 above. 4. Possible ATN. Discussed with patient possibility of needing dilaysis.   LOS: 18 Venesa Semidey W @TODAY @9 :53 AM

## 2012-02-03 NOTE — Progress Notes (Signed)
PT Cancellation Note  Patient Details Name: [name] MRN: [id number] DOB: [birth year]   Cancelled Treatment:     Attempted PT tx session this am. Pt preparing to leave to have procedure-drain placed. Will check back another time/day for PT session. Thanks.    [name] [facility] 02/03/2012, 11:43 AM [phone]

## 2012-02-04 LAB — RENAL FUNCTION PANEL
CO2: 18 mEq/L — ABNORMAL LOW (ref 19–32)
Chloride: 97 mEq/L (ref 96–112)
Creatinine, Ser: 5.74 mg/dL — ABNORMAL HIGH (ref 0.50–1.35)
GFR calc non Af Amer: 11 mL/min — ABNORMAL LOW (ref >90)
Glucose, Bld: 80 mg/dL (ref 70–99)

## 2012-02-04 LAB — CBC
HCT: 29.6 % — ABNORMAL LOW (ref 39.0–52.0)
Hemoglobin: 9.7 g/dL — ABNORMAL LOW (ref 13.0–17.0)
MCV: 90.8 fL (ref 78.0–100.0)
Platelets: 370 10*3/uL (ref 150–400)
RBC: 3.26 MIL/uL — ABNORMAL LOW (ref 4.22–5.81)
WBC: 6.1 10*3/uL (ref 4.0–10.5)

## 2012-02-04 MED ORDER — DAKINS (1/2 STRENGTH) 0.25 % EX SOLN
Freq: Two times a day (BID) | CUTANEOUS | Status: DC
  Administered 2012-02-05 – 2012-02-06 (×2)
  Filled 2012-02-04: qty 473

## 2012-02-04 MED ORDER — SODIUM CHLORIDE 0.9 % IR SOLN: 200.0000 mL | Freq: Once | Status: DC

## 2012-02-04 NOTE — Progress Notes (Signed)
18 Days Post-Op  Subjective: Pt doing fairly well; sitting up and eating lunch(barbecue) now; a little sore at left abd drain sites  Objective: Vital signs in last 24 hours: Temp:  [97.9 F (36.6 C)-98.5 F (36.9 C)] 98.4 F (36.9 C) (10/19 0541) Pulse Rate:  [75-91] 82  (10/19 0541) Resp:  [13-23] 20  (10/19 0541) BP: (117-133)/(74-94) 131/84 mmHg (10/19 0541) SpO2:  [97 %-100 %] 98 % (10/19 0541) Weight:  [286 lb 9.6 oz (130 kg)] 286 lb 9.6 oz (130 kg) (10/19 0500) Last BM Date: 02/04/12  Intake/Output from previous day: 10/18 0701 - 10/19 0700 In: 1518.3 [P.O.:120; I.V.:1278.3; IV Piggyback:110] Out: 1525 [Urine:1275; Drains:150; Stool:100] Intake/Output this shift: Total I/O In: 240 [P.O.:240] Out: -   Left abd drains intact, output last 12 hours  about 150 cc's between both drains  Lab Results:   [facility] 02/04/12 0606  WBC 6.1  HGB 9.7*  HCT 29.6*  PLT 370   BMET  Basename 02/04/12 0500 02/03/12 0605  NA 128* 127*  K 4.5 4.8  CL 97 95*  CO2 18* 20  GLUCOSE 80 118*  BUN 33* 26*  CREATININE 5.74* 4.48*  CALCIUM 7.6* 8.0*   PT/INR No results found for this basename: LABPROT:2,INR:2 in the last 72 hours ABG No results found for this basename: PHART:2,PCO2:2,PO2:2,HCO3:2 in the last 72 hours  Studies/Results: Ir Catheter Tube Change  02/03/2012  *RADIOLOGY REPORT*  Clinical history:41 year old with a large abdominal abscess along the left side of the abdominal cavity.  The patient already has a percutaneous drain in place.  PROCEDURE(S): INJECTION OF EXISTING CATHETER WITH FLUOROSCOPY; REPOSITIONING AND EXCHANGE OF DRAINAGE CATHETERS WITH FLUOROSCOPY  Physician: Rachelle Hora. Henn, MD  Medications:Versed 3 mg, Fentanyl 200 mcg.  A radiology nurse monitored the patient for moderate sedation.  Moderate sedation time:40 minutes  Fluoroscopy time: 9.0 minutes  Procedure:The procedure was explained to the patient.  The risks and benefits of the procedure were discussed  and the patient's questions were addressed.  Informed consent was obtained from the patient.  The patient was placed supine on the interventional table.  Contrast was injected through the existing catheter.  The catheter and surrounding skin were prepped and draped in a sterile fashion.  Maximal barrier sterile technique was utilized including caps, mask, sterile gowns, sterile gloves, sterile drape, hand hygiene and skin antiseptic.  Catheter was cut and removed over a Bentson wire.  Kumpe catheter was used to advance the Bentson wire into the superior aspect of the collection and the wire eventually advanced all the way down into the lower abdomen.  A new 12-French biliary drain was placed and a large volume of light brown fluid was removed.  Eventually, this catheter were removed over the [facility] wire and exchanged for a 7-French vascular sheath.  A second Bentson wire was placed.  Using a Kumpe catheter, one wire was advanced to the superior aspect of the collection and the second wire was advanced into the inferior aspect of collection.  A 12- [place] biliary drain was placed over each wire.  A total of 700 ml of light brown purulent fluid was removed between both catheters. Catheter were sutured to the skin and attached to suction bulbs.Fluoroscopic images were taken and saved for this procedure.  Findings:The patient has a massive left sided abdominal abscess based on previous CT and ultrasound imaging.  Only a small pocket of the collection was filling with contrast from the existing catheter.  Wires were advanced to the  superior and inferior aspects of the collection and two drains were placed.  700 ml of purulent fluid removed.  Complications: None  Impression:The old 12-French catheter was exchanged for two 12 French catheters that were positioned within the superior and inferior aspect of the large abscess collection. 700 ml of purulent fluid was removed.   Original Report Authenticated By: Richarda Overlie, M.D.      Anti-infectives: Anti-infectives     Start     Dose/Rate Route Frequency Ordered Stop   02/03/12 1800   piperacillin-tazobactam (ZOSYN) IVPB 2.25 g        2.25 g 100 mL/hr over 30 Minutes Intravenous 4 times per day 02/03/12 0924     02/01/12 2300   vancomycin (VANCOCIN) 1,500 mg in sodium chloride 0.9 % 500 mL IVPB  Status:  Discontinued        1,500 mg 250 mL/hr over 120 Minutes Intravenous Every 12 hours 02/01/12 1355 02/02/12 0924   02/01/12 0800   piperacillin-tazobactam (ZOSYN) IVPB 3.375 g  Status:  Discontinued        3.375 g 12.5 mL/hr over 240 Minutes Intravenous Every 8 hours 02/01/12 0724 02/03/12 0924   01/31/12 1000   ciprofloxacin (CIPRO) tablet 500 mg  Status:  Discontinued        500 mg Oral 2 times daily 01/31/12 0950 02/01/12 0724   01/28/12 2300   vancomycin (VANCOCIN) 2,000 mg in sodium chloride 0.9 % 500 mL IVPB  Status:  Discontinued        2,000 mg 250 mL/hr over 120 Minutes Intravenous Every 12 hours 01/27/12 2239 01/27/12 2243   01/28/12 1200   vancomycin (VANCOCIN) 2,000 mg in sodium chloride 0.9 % 500 mL IVPB  Status:  Discontinued        2,000 mg 250 mL/hr over 120 Minutes Intravenous Every 12 hours 01/27/12 2233 01/27/12 2238   01/27/12 2300   vancomycin (VANCOCIN) 750 mg in sodium chloride 0.9 % 150 mL IVPB  Status:  Discontinued        750 mg 150 mL/hr over 60 Minutes Intravenous  Once 01/27/12 2232 01/27/12 2233   01/27/12 2300   vancomycin (VANCOCIN) 2,000 mg in sodium chloride 0.9 % 500 mL IVPB  Status:  Discontinued        2,000 mg 250 mL/hr over 120 Minutes Intravenous Every 12 hours 01/27/12 2244 02/01/12 1355   01/25/12 1045   vancomycin (VANCOCIN) 1,250 mg in sodium chloride 0.9 % 250 mL IVPB  Status:  Discontinued        1,250 mg 166.7 mL/hr over 90 Minutes Intravenous 2 times daily 01/25/12 1020 01/27/12 2237   01/23/12 1300   micafungin (MYCAMINE) 100 mg in sodium chloride 0.9 % 100 mL IVPB        100 mg 100 mL/hr over 1 Hours  Intravenous Daily 01/23/12 1158 01/30/12 1226   01/16/12 1800   piperacillin-tazobactam (ZOSYN) IVPB 3.375 g        3.375 g 12.5 mL/hr over 240 Minutes Intravenous 3 times per day 01/16/12 1721 01/27/12 1012          Assessment/Plan: s/p left abd abscess drainage 10/8; exchange of existing drain and additional left abd drain placed 10/18. Continue current tx as per CCS/renal.   LOS: 19 days    ALLRED,D [facility] 02/04/2012

## 2012-02-04 NOTE — Progress Notes (Addendum)
[facility] ROUNDING NOTE   Subjective:   Interval History: feels better  Objective:  Vital signs in last 24 hours:  Temp:  [97.9 F (36.6 C)-98.5 F (36.9 C)] 98.4 F (36.9 C) (10/19 0541) Pulse Rate:  [75-91] 82  (10/19 0541) Resp:  [13-23] 20  (10/19 0541) BP: (117-133)/(74-94) 131/84 mmHg (10/19 0541) SpO2:  [97 %-100 %] 98 % (10/19 0541) Weight:  [130 kg (286 lb 9.6 oz)] 130 kg (286 lb 9.6 oz) (10/19 0500)  Weight change:  Filed Weights   02/02/12 0211 02/02/12 0500 02/04/12 0500  Weight: 129.2 kg (284 lb 13.4 oz) 127.7 kg (281 lb 8.4 oz) 130 kg (286 lb 9.6 oz)    Intake/Output: I/O last 3 completed shifts: In: 1533.3 [P.O.:120; I.V.:1288.3; Other:15; IV Piggyback:110] Out: 2745 [Urine:2075; Drains:170; Stool:500]   Intake/Output this shift:     CVS- RRR RS- CTA ABD- BS present soft non-distended EXT- no edema   Basic Metabolic Panel:  Lab 02/04/12 1610 02/03/12 0605 02/02/12 0810 02/02/12 0545 02/01/12 0455 01/30/12 0629 01/29/12 0350  NA 128* 127* 131* 130* 133* -- --  K 4.5 4.8 4.2 4.2 3.2* -- --  CL 97 95* 99 98 100 -- --  CO2 18* 20 21 21 22  -- --  GLUCOSE 80 118* 103* 117* 177* -- --  BUN 33* 26* 17 16 10  -- --  CREATININE 5.74* 4.48* 2.55* 2.29* 0.95 -- --  CALCIUM 7.6* 8.0* 7.9* -- -- -- --  MG -- 1.9 -- -- -- 2.3 2.2  PHOS 6.1* 4.7* -- -- -- 2.8 2.5    Liver Function Tests:  Lab 02/04/12 0500 01/30/12 0629  AST -- 49*  ALT -- 89*  ALKPHOS -- 70  BILITOT -- 0.2*  PROT -- 6.0  ALBUMIN 1.6* 1.8*   No results found for this basename: LIPASE:5,AMYLASE:5 in the last 168 hours No results found for this basename: AMMONIA:3 in the last 168 hours  CBC:  Lab 02/04/12 0606 02/01/12 0455 01/30/12 0629  WBC 6.1 9.9 7.6  NEUTROABS -- -- 4.7  HGB 9.7* 10.7* 10.9*  HCT 29.6* 32.4* 32.6*  MCV 90.8 92.3 91.6  PLT 370 465* 494*    Cardiac Enzymes: No results found for this basename: CKTOTAL:5,CKMB:5,CKMBINDEX:5,TROPONINI:5 in the  last 168 hours  BNP: No components found with this basename: POCBNP:5  CBG:  Lab 01/30/12 2024 01/30/12 1202 01/30/12 0735 01/30/12 0438 01/29/12 2344  GLUCAP 147* 184* 145* 171* 147*    Microbiology: Results for orders placed during the hospital encounter of 01/16/12  SURGICAL PCR SCREEN     Status: Normal   Collection Time   01/17/12  8:28 AM      Component Value Range Status Comment   MRSA, PCR NEGATIVE  NEGATIVE Final    Staphylococcus aureus NEGATIVE  NEGATIVE Final   CULTURE, BLOOD (ROUTINE X 2)     Status: Normal   Collection Time   01/19/12 10:46 AM      Component Value Range Status Comment   Specimen Description BLOOD LEFT ARM   Final    Special Requests BOTTLES DRAWN AEROBIC AND ANAEROBIC 10CC   Final    Culture  Setup Time 01/19/2012 16:55   Final    Culture NO GROWTH 5 DAYS   Final    Report Status 01/25/2012 FINAL   Final   CULTURE, BLOOD (ROUTINE X 2)     Status: Normal   Collection Time   01/19/12 10:48 AM      Component  Value Range Status Comment   Specimen Description BLOOD LEFT ARM   Final    Special Requests BOTTLES DRAWN AEROBIC AND ANAEROBIC 10CC   Final    Culture  Setup Time 01/19/2012 16:55   Final    Culture NO GROWTH 5 DAYS   Final    Report Status 01/25/2012 FINAL   Final   CULTURE, ROUTINE-ABSCESS     Status: Normal   Collection Time   01/24/12  1:10 PM      Component Value Range Status Comment   Specimen Description PERITONEAL CAVITY   Final    Special Requests NONE   Final    Gram Stain     Final    Value: FEW WBC PRESENT, PREDOMINANTLY PMN     NO SQUAMOUS EPITHELIAL CELLS SEEN     ABUNDANT GRAM NEGATIVE RODS     ABUNDANT GRAM POSITIVE RODS     FEW GRAM VARIABLE ROD   Culture ABUNDANT ENTEROCOCCUS SPECIES   Final    Report Status 01/27/2012 FINAL   Final    Organism ID, Bacteria ENTEROCOCCUS SPECIES   Final   CULTURE, ROUTINE-ABSCESS     Status: Normal   Collection Time   01/24/12  2:07 PM      Component Value Range Status Comment    Specimen Description PERITONEAL CAVITY   Final    Special Requests NONE   Final    Gram Stain     Final    Value: RARE WBC PRESENT, PREDOMINANTLY PMN     NO SQUAMOUS EPITHELIAL CELLS SEEN     ABUNDANT GRAM POSITIVE RODS     MODERATE GRAM NEGATIVE RODS     MODERATE GRAM VARIABLE ROD   Culture ABUNDANT ENTEROCOCCUS SPECIES   Final    Report Status 01/27/2012 FINAL   Final    Organism ID, Bacteria ENTEROCOCCUS SPECIES   Final   BODY FLUID CULTURE     Status: Normal   Collection Time   01/31/12  5:41 PM      Component Value Range Status Comment   Specimen Description ABSCESS   Final    Special Requests Normal   Final    Gram Stain     Final    Value: ABUNDANT WBC PRESENT,BOTH PMN AND MONONUCLEAR     ABUNDANT GRAM NEGATIVE RODS     Gram Stain Report Called to,Read Back By and Verified With: Gram Stain Report Called to,Read Back By and Verified With: CLARISSA STATEN RN 02/01/12 8:15AM BY MILSH   Culture FEW KLEBSIELLA PNEUMONIAE   Final    Report Status 02/03/2012 FINAL   Final    Organism ID, Bacteria KLEBSIELLA PNEUMONIAE   Final     Coagulation Studies: No results found for this basename: LABPROT:5,INR:5 in the last 72 hours  Urinalysis:  Basename 02/02/12 1919  COLORURINE YELLOW  LABSPEC 1.012  PHURINE 5.5  GLUCOSEU NEGATIVE  HGBUR TRACE*  BILIRUBINUR NEGATIVE  KETONESUR NEGATIVE  PROTEINUR 30*  UROBILINOGEN 0.2  NITRITE NEGATIVE  LEUKOCYTESUR NEGATIVE      Imaging: Ir Catheter Tube Change  02/03/2012  *RADIOLOGY REPORT*  Clinical history:41 year old with a large abdominal abscess along the left side of the abdominal cavity.  The patient already has a percutaneous drain in place.  PROCEDURE(S): INJECTION OF EXISTING CATHETER WITH FLUOROSCOPY; REPOSITIONING AND EXCHANGE OF DRAINAGE CATHETERS WITH FLUOROSCOPY  Physician: Rachelle Hora. Henn, MD  Medications:Versed 3 mg, Fentanyl 200 mcg.  A radiology nurse monitored the patient for moderate sedation.  Moderate sedation time:40  minutes  Fluoroscopy time: 9.0 minutes  Procedure:The procedure was explained to the patient.  The risks and benefits of the procedure were discussed and the patient's questions were addressed.  Informed consent was obtained from the patient.  The patient was placed supine on the interventional table.  Contrast was injected through the existing catheter.  The catheter and surrounding skin were prepped and draped in a sterile fashion.  Maximal barrier sterile technique was utilized including caps, mask, sterile gowns, sterile gloves, sterile drape, hand hygiene and skin antiseptic.  Catheter was cut and removed over a Bentson wire.  Kumpe catheter was used to advance the Bentson wire into the superior aspect of the collection and the wire eventually advanced all the way down into the lower abdomen.  A new 12-French biliary drain was placed and a large volume of light brown fluid was removed.  Eventually, this catheter were removed over the [facility] wire and exchanged for a 7-French vascular sheath.  A second Bentson wire was placed.  Using a Kumpe catheter, one wire was advanced to the superior aspect of the collection and the second wire was advanced into the inferior aspect of collection.  A 12- [place] biliary drain was placed over each wire.  A total of 700 ml of light brown purulent fluid was removed between both catheters. Catheter were sutured to the skin and attached to suction bulbs.Fluoroscopic images were taken and saved for this procedure.  Findings:The patient has a massive left sided abdominal abscess based on previous CT and ultrasound imaging.  Only a small pocket of the collection was filling with contrast from the existing catheter.  Wires were advanced to the superior and inferior aspects of the collection and two drains were placed.  700 ml of purulent fluid removed.  Complications: None  Impression:The old 12-French catheter was exchanged for two 12 French catheters that were positioned within the  superior and inferior aspect of the large abscess collection. 700 ml of purulent fluid was removed.   Original Report Authenticated By: Richarda Overlie, M.D.      Medications:      . sodium chloride 100 mL/hr at 02/04/12 0315      . enoxaparin  30 mg Subcutaneous Q24H  . feeding supplement  237 mL Oral BID BM  . lidocaine      . pantoprazole (PROTONIX) IV  40 mg Intravenous QHS  . piperacillin-tazobactam (ZOSYN)  IV  2.25 g Intravenous Q6H  . sodium chloride  10-40 mL Intracatheter Q12H  . DISCONTD: enoxaparin  60 mg Subcutaneous Q24H  . DISCONTD: enoxaparin  60 mg Subcutaneous Q24H   acetaminophen (TYLENOL) oral liquid 160 mg/5 mL, albuterol-ipratropium, fentaNYL, fentaNYL, hydrALAZINE, LORazepam, magic mouthwash, midazolam, ondansetron, oxyCODONE-acetaminophen, sodium chloride  Assessment/ Plan:  1. Acute renal failure -he received 80 cc radiocontrast but his creatinine was already elevated at 2.3 prior that procedure. Place foley catheter to better monitor output. Worsening renal failure  2. Perforated diverticulum/S./P. colectomy, colostomy and drain placement-per surgery.  3. Past history urinary retention with Urethral stricture-see 1 above.  4. Possible ATN. Discussed with patient possibility of needing dilaysis.  Creatinine continues to worsen but good urine output and fluid balance. Hemodynamically stable and anticipate.   LOS: 19 [name] W @TODAY @10 :18 AM   Vancomycin level 47, no longer getting Vancomycin.

## 2012-02-04 NOTE — Progress Notes (Signed)
40 Days Post-Op  Subjective: Feels better  Objective: Vital signs in last 24 hours: Temp:  [97.9 F (36.6 C)-98.5 F (36.9 C)] 98.4 F (36.9 C) (10/19 0541) Pulse Rate:  [75-91] 82  (10/19 0541) Resp:  [13-23] 20  (10/19 0541) BP: (117-133)/(74-94) 131/84 mmHg (10/19 0541) SpO2:  [97 %-100 %] 98 % (10/19 0541) Weight:  [286 lb 9.6 oz (130 kg)] 286 lb 9.6 oz (130 kg) (10/19 0500) Last BM Date: 02/03/12  Intake/Output from previous day: 10/18 0701 - 10/19 0700 In: 1518.3 [P.O.:120; I.V.:1278.3; IV Piggyback:110] Out: 1525 [Urine:1275; Drains:150; Stool:100] Intake/Output this shift:    Incision/Wound:open and clean.  Ostomy functioning well and viable.  Drains with minimal output.  Urine clear  Lab Results:   [facility] 02/04/12 0606  WBC 6.1  HGB 9.7*  HCT 29.6*  PLT 370   BMET  Basename 02/04/12 0500 02/03/12 0605  NA 128* 127*  K 4.5 4.8  CL 97 95*  CO2 18* 20  GLUCOSE 80 118*  BUN 33* 26*  CREATININE 5.74* 4.48*  CALCIUM 7.6* 8.0*   PT/INR No results found for this basename: LABPROT:2,INR:2 in the last 72 hours ABG No results found for this basename: PHART:2,PCO2:2,PO2:2,HCO3:2 in the last 72 hours  Studies/Results: Ct Abdomen Pelvis W Contrast  02/02/2012  *RADIOLOGY REPORT*  Clinical Data: Post partial colectomy/colostomy.  Left abscess drain in place.  Abdominal pain.  CT ABDOMEN AND PELVIS WITH CONTRAST  Technique:  Multidetector CT imaging of the abdomen and pelvis was performed following the standard protocol during bolus administration of intravenous contrast.  Contrast: 80mL OMNIPAQUE IOHEXOL 300 MG/ML  SOLN  Comparison: Most recent examination 01/27/2012.  Findings: Post resection distal transverse colon down proximal descending colon with colostomy formation left upper quadrant. Similar appearance of the colostomy to the prior exam.  Within the left aspect of the abdomen and pelvis, there is a complex fluid and air collection/abscess.  Drainage catheter  within the superior posterior aspect of this abscess.  Abscess has increased  in size when compared to the prior examination.  The proximal aspect of the mid to distal descending colon anastomoses is contained within the abscess collection.  Leak at this level cannot be excluded.  Slight increase in size of left-sided pleural effusion.  Bibasilar subsegmental atelectasis.  Ventral incision healing by secondary intent.  Fatty liver without focal hepatic lesion.  Gallbladder sludge without calcified gallstone.  Poorly functioning kidneys without focal hepatic, splenic, pancreatic or adrenal lesion.  Scattered small lymph nodes mesenteric region.  Under distended thickened bladder slightly asymmetric with thickening greater superiorly and on the left.  Degenerative changes with calcified disc protrusions lower thoracic and lumbar spine.  IMPRESSION: Post resection distal transverse colon down proximal descending colon with colostomy formation left upper quadrant.  Similar appearance of the colostomy to the prior exam.  Drainage catheter within the superior posterior aspect of left- sided abdominal - pelvic abscess.  Slight increase in size of abscess when compared to the prior exam.  The proximal aspect of the mid to distal descending colon anastomoses is contained within the abscess collection.  Leak at this level cannot be excluded.  Slight increase in size of left-sided pleural effusion.  Bibasilar subsegmental atelectasis.  Ventral incision healing by secondary intent.  Poorly functioning kidneys.  Under distended thickened bladder slightly asymmetric with thickening greater superiorly and on the left.  This has been made a PRA call report utilizing dashboard call feature.   Original Report Authenticated By: Almedia Balls  Constance Goltz, M.D.    Ir Catheter Tube Change  02/03/2012  *RADIOLOGY REPORT*  Clinical history:41 year old with a large abdominal abscess along the left side of the abdominal cavity.  The patient  already has a percutaneous drain in place.  PROCEDURE(S): INJECTION OF EXISTING CATHETER WITH FLUOROSCOPY; REPOSITIONING AND EXCHANGE OF DRAINAGE CATHETERS WITH FLUOROSCOPY  Physician: Rachelle Hora. Henn, MD  Medications:Versed 3 mg, Fentanyl 200 mcg.  A radiology nurse monitored the patient for moderate sedation.  Moderate sedation time:40 minutes  Fluoroscopy time: 9.0 minutes  Procedure:The procedure was explained to the patient.  The risks and benefits of the procedure were discussed and the patient's questions were addressed.  Informed consent was obtained from the patient.  The patient was placed supine on the interventional table.  Contrast was injected through the existing catheter.  The catheter and surrounding skin were prepped and draped in a sterile fashion.  Maximal barrier sterile technique was utilized including caps, mask, sterile gowns, sterile gloves, sterile drape, hand hygiene and skin antiseptic.  Catheter was cut and removed over a Bentson wire.  Kumpe catheter was used to advance the Bentson wire into the superior aspect of the collection and the wire eventually advanced all the way down into the lower abdomen.  A new 12-French biliary drain was placed and a large volume of light brown fluid was removed.  Eventually, this catheter were removed over the [facility] wire and exchanged for a 7-French vascular sheath.  A second Bentson wire was placed.  Using a Kumpe catheter, one wire was advanced to the superior aspect of the collection and the second wire was advanced into the inferior aspect of collection.  A 12- [place] biliary drain was placed over each wire.  A total of 700 ml of light brown purulent fluid was removed between both catheters. Catheter were sutured to the skin and attached to suction bulbs.Fluoroscopic images were taken and saved for this procedure.  Findings:The patient has a massive left sided abdominal abscess based on previous CT and ultrasound imaging.  Only a small pocket of the  collection was filling with contrast from the existing catheter.  Wires were advanced to the superior and inferior aspects of the collection and two drains were placed.  700 ml of purulent fluid removed.  Complications: None  Impression:The old 12-French catheter was exchanged for two 12 French catheters that were positioned within the superior and inferior aspect of the large abscess collection. 700 ml of purulent fluid was removed.   Original Report Authenticated By: Richarda Overlie, M.D.     Anti-infectives: Anti-infectives     Start     Dose/Rate Route Frequency Ordered Stop   02/03/12 1800   piperacillin-tazobactam (ZOSYN) IVPB 2.25 g        2.25 g 100 mL/hr over 30 Minutes Intravenous 4 times per day 02/03/12 0924     02/01/12 2300   vancomycin (VANCOCIN) 1,500 mg in sodium chloride 0.9 % 500 mL IVPB  Status:  Discontinued        1,500 mg 250 mL/hr over 120 Minutes Intravenous Every 12 hours 02/01/12 1355 02/02/12 0924   02/01/12 0800   piperacillin-tazobactam (ZOSYN) IVPB 3.375 g  Status:  Discontinued        3.375 g 12.5 mL/hr over 240 Minutes Intravenous Every 8 hours 02/01/12 0724 02/03/12 0924   01/31/12 1000   ciprofloxacin (CIPRO) tablet 500 mg  Status:  Discontinued        500 mg Oral 2 times daily 01/31/12 0950  02/01/12 0724   01/28/12 2300   vancomycin (VANCOCIN) 2,000 mg in sodium chloride 0.9 % 500 mL IVPB  Status:  Discontinued        2,000 mg 250 mL/hr over 120 Minutes Intravenous Every 12 hours 01/27/12 2239 01/27/12 2243   01/28/12 1200   vancomycin (VANCOCIN) 2,000 mg in sodium chloride 0.9 % 500 mL IVPB  Status:  Discontinued        2,000 mg 250 mL/hr over 120 Minutes Intravenous Every 12 hours 01/27/12 2233 01/27/12 2238   01/27/12 2300   vancomycin (VANCOCIN) 750 mg in sodium chloride 0.9 % 150 mL IVPB  Status:  Discontinued        750 mg 150 mL/hr over 60 Minutes Intravenous  Once 01/27/12 2232 01/27/12 2233   01/27/12 2300   vancomycin (VANCOCIN) 2,000 mg in  sodium chloride 0.9 % 500 mL IVPB  Status:  Discontinued        2,000 mg 250 mL/hr over 120 Minutes Intravenous Every 12 hours 01/27/12 2244 02/01/12 1355   01/25/12 1045   vancomycin (VANCOCIN) 1,250 mg in sodium chloride 0.9 % 250 mL IVPB  Status:  Discontinued        1,250 mg 166.7 mL/hr over 90 Minutes Intravenous 2 times daily 01/25/12 1020 01/27/12 2237   01/23/12 1300   micafungin (MYCAMINE) 100 mg in sodium chloride 0.9 % 100 mL IVPB        100 mg 100 mL/hr over 1 Hours Intravenous Daily 01/23/12 1158 01/30/12 1226   01/16/12 1800   piperacillin-tazobactam (ZOSYN) IVPB 3.375 g        3.375 g 12.5 mL/hr over 240 Minutes Intravenous 3 times per day 01/16/12 1721 01/27/12 1012          Assessment/Plan: s/p Procedure(s) (LRB) with comments: PARTIAL COLECTOMY (N/A) COLOSTOMY (N/A) Patient Active Problem List  Diagnosis  . UTI (lower urinary tract infection)  . Constipation  . Diverticulitis of colon without hemorrhage  . Abdominal pain  . Colon obstruction  . Obesity, Class III, BMI 40-49.9 (morbid obesity)  . Acute respiratory failure with hypoxia  . Acute pulmonary edema  . Encephalopathy acute  . Tobacco abuse  . Intra-abdominal abscess, diverticular s/p surgical drainage  . Hypertriglyceridemia  ARF  Creatinine up but seems to be plateau hopefully  Good UO.  Continue foley for strict I and O ABDOMINAL ABSCESS   Drains functioning well.  Continue ABX. ambulate  LOS: 19 days    Mahari Vankirk A. 02/04/2012

## 2012-02-05 LAB — RENAL FUNCTION PANEL
Albumin: 1.6 g/dL — ABNORMAL LOW (ref 3.5–5.2)
BUN: 40 mg/dL — ABNORMAL HIGH (ref 6–23)
Chloride: 101 mEq/L (ref 96–112)
GFR calc non Af Amer: 9 mL/min — ABNORMAL LOW (ref >90)
Potassium: 4.6 mEq/L (ref 3.5–5.1)

## 2012-02-05 MED ORDER — PANTOPRAZOLE SODIUM 40 MG PO TBEC
40.0000 mg | DELAYED_RELEASE_TABLET | Freq: Every day | ORAL | Status: DC
  Administered 2012-02-05 – 2012-02-12 (×8): 40 mg via ORAL
  Filled 2012-02-05 (×9): qty 1

## 2012-02-05 NOTE — Progress Notes (Signed)
19 Days Post-Op  Subjective: Pt without new c/o; no nausea/vomiting or increase in abd pain  Objective: Vital signs in last 24 hours: Temp:  [98.3 F (36.8 C)-100.1 F (37.8 C)] 99.1 F (37.3 C) (10/20 0500) Pulse Rate:  [78-96] 89  (10/20 0500) Resp:  [16-18] 18  (10/20 0500) BP: (117-143)/(72-82) 143/82 mmHg (10/20 0500) SpO2:  [95 %-97 %] 97 % (10/20 0500) Last BM Date: 02/04/12  Intake/Output from previous day: 10/19 0701 - 10/20 0700 In: 3275 [P.O.:720; I.V.:1700] Out: 1315 [Urine:1300; Drains:14; Stool:1] Intake/Output this shift:    Left abd drains intact, outputs less than 10cc's each ; drains flushed with 10 cc's sterile NS with return of thick, turbid pink fluid.   Lab Results:   [facility] 02/04/12 0606  WBC 6.1  HGB 9.7*  HCT 29.6*  PLT 370   BMET  Basename 02/05/12 0438 02/04/12 0500  NA 133* 128*  K 4.6 4.5  CL 101 97  CO2 16* 18*  GLUCOSE 89 80  BUN 40* 33*  CREATININE 6.65* 5.74*  CALCIUM 7.6* 7.6*   PT/INR No results found for this basename: LABPROT:2,INR:2 in the last 72 hours ABG No results found for this basename: PHART:2,PCO2:2,PO2:2,HCO3:2 in the last 72 hours  Studies/Results: Ir Catheter Tube Change  02/03/2012  *RADIOLOGY REPORT*  Clinical history:41 year old with a large abdominal abscess along the left side of the abdominal cavity.  The patient already has a percutaneous drain in place.  PROCEDURE(S): INJECTION OF EXISTING CATHETER WITH FLUOROSCOPY; REPOSITIONING AND EXCHANGE OF DRAINAGE CATHETERS WITH FLUOROSCOPY  Physician: Rachelle Hora. Henn, MD  Medications:Versed 3 mg, Fentanyl 200 mcg.  A radiology nurse monitored the patient for moderate sedation.  Moderate sedation time:40 minutes  Fluoroscopy time: 9.0 minutes  Procedure:The procedure was explained to the patient.  The risks and benefits of the procedure were discussed and the patient's questions were addressed.  Informed consent was obtained from the patient.  The patient was placed  supine on the interventional table.  Contrast was injected through the existing catheter.  The catheter and surrounding skin were prepped and draped in a sterile fashion.  Maximal barrier sterile technique was utilized including caps, mask, sterile gowns, sterile gloves, sterile drape, hand hygiene and skin antiseptic.  Catheter was cut and removed over a Bentson wire.  Kumpe catheter was used to advance the Bentson wire into the superior aspect of the collection and the wire eventually advanced all the way down into the lower abdomen.  A new 12-French biliary drain was placed and a large volume of light brown fluid was removed.  Eventually, this catheter were removed over the [facility] wire and exchanged for a 7-French vascular sheath.  A second Bentson wire was placed.  Using a Kumpe catheter, one wire was advanced to the superior aspect of the collection and the second wire was advanced into the inferior aspect of collection.  A 12- [place] biliary drain was placed over each wire.  A total of 700 ml of light brown purulent fluid was removed between both catheters. Catheter were sutured to the skin and attached to suction bulbs.Fluoroscopic images were taken and saved for this procedure.  Findings:The patient has a massive left sided abdominal abscess based on previous CT and ultrasound imaging.  Only a small pocket of the collection was filling with contrast from the existing catheter.  Wires were advanced to the superior and inferior aspects of the collection and two drains were placed.  700 ml of purulent fluid removed.  Complications:  None  Impression:The old 12-French catheter was exchanged for two 12 French catheters that were positioned within the superior and inferior aspect of the large abscess collection. 700 ml of purulent fluid was removed.   Original Report Authenticated By: Richarda Overlie, M.D.     Anti-infectives: Anti-infectives     Start     Dose/Rate Route Frequency Ordered Stop   02/03/12 1800   piperacillin-tazobactam (ZOSYN) IVPB 2.25 g       2.25 g 100 mL/hr over 30 Minutes Intravenous 4 times per day 02/03/12 0924     02/01/12 2300   vancomycin (VANCOCIN) 1,500 mg in sodium chloride 0.9 % 500 mL IVPB  Status:  Discontinued        1,500 mg 250 mL/hr over 120 Minutes Intravenous Every 12 hours 02/01/12 1355 02/02/12 0924   02/01/12 0800   piperacillin-tazobactam (ZOSYN) IVPB 3.375 g  Status:  Discontinued        3.375 g 12.5 mL/hr over 240 Minutes Intravenous Every 8 hours 02/01/12 0724 02/03/12 0924   01/31/12 1000   ciprofloxacin (CIPRO) tablet 500 mg  Status:  Discontinued        500 mg Oral 2 times daily 01/31/12 0950 02/01/12 0724   01/28/12 2300   vancomycin (VANCOCIN) 2,000 mg in sodium chloride 0.9 % 500 mL IVPB  Status:  Discontinued        2,000 mg 250 mL/hr over 120 Minutes Intravenous Every 12 hours 01/27/12 2239 01/27/12 2243   01/28/12 1200   vancomycin (VANCOCIN) 2,000 mg in sodium chloride 0.9 % 500 mL IVPB  Status:  Discontinued        2,000 mg 250 mL/hr over 120 Minutes Intravenous Every 12 hours 01/27/12 2233 01/27/12 2238   01/27/12 2300   vancomycin (VANCOCIN) 750 mg in sodium chloride 0.9 % 150 mL IVPB  Status:  Discontinued        750 mg 150 mL/hr over 60 Minutes Intravenous  Once 01/27/12 2232 01/27/12 2233   01/27/12 2300   vancomycin (VANCOCIN) 2,000 mg in sodium chloride 0.9 % 500 mL IVPB  Status:  Discontinued        2,000 mg 250 mL/hr over 120 Minutes Intravenous Every 12 hours 01/27/12 2244 02/01/12 1355   01/25/12 1045   vancomycin (VANCOCIN) 1,250 mg in sodium chloride 0.9 % 250 mL IVPB  Status:  Discontinued        1,250 mg 166.7 mL/hr over 90 Minutes Intravenous 2 times daily 01/25/12 1020 01/27/12 2237   01/23/12 1300   micafungin (MYCAMINE) 100 mg in sodium chloride 0.9 % 100 mL IVPB        100 mg 100 mL/hr over 1 Hours Intravenous Daily 01/23/12 1158 01/30/12 1226   01/16/12 1800  piperacillin-tazobactam (ZOSYN) IVPB 3.375 g         3.375 g 12.5 mL/hr over 240 Minutes Intravenous 3 times per day 01/16/12 1721 01/27/12 1012          Assessment/Plan: s/p left abd abscess drainages 10/8, 10/18; cont drain flushes; check CT latter part of next week (without contrast); will need injection of drains before consideration given to removal. Other plans per [facility]/renal.   LOS: 20 days    [name],D [facility] 02/05/2012

## 2012-02-05 NOTE — Progress Notes (Signed)
Patient ID: [name], male   DOB: 1970/11/17, 41 y.o.   MRN: [id number] 19 Days Post-Op  Subjective: No complaints today. A little bit of left-sided back pain but not severe. He has been up out of bed. Tolerating his diet without difficulty.  Objective: Vital signs in last 24 hours: Temp:  [98.3 F (36.8 C)-100.1 F (37.8 C)] 99.1 F (37.3 C) (10/20 0500) Pulse Rate:  [78-96] 89  (10/20 0500) Resp:  [16-18] 18  (10/20 0500) BP: (117-143)/(72-82) 143/82 mmHg (10/20 0500) SpO2:  [95 %-97 %] 97 % (10/20 0500) Last BM Date: 02/04/12  Intake/Output from previous day: 10/19 0701 - 10/20 0700 In: 3275 [P.O.:720; I.V.:1700] Out: 1315 [Urine:1300; Drains:14; Stool:1] Intake/Output this shift:    General appearance: alert, cooperative and morbidly obese GI: normal findings: soft, non-tender and colostomy functioning with stool and gas Incision/Wound: packed open, dressing clean and dry. The small volume purulent drainage in drainage catheters  Lab Results:   [facility] 02/04/12 0606  WBC 6.1  HGB 9.7*  HCT 29.6*  PLT 370   BMET  Basename 02/05/12 0438 02/04/12 0500  NA 133* 128*  K 4.6 4.5  CL 101 97  CO2 16* 18*  GLUCOSE 89 80  BUN 40* 33*  CREATININE 6.65* 5.74*  CALCIUM 7.6* 7.6*     Studies/Results: Ir Catheter Tube Change  02/03/2012  *RADIOLOGY REPORT*  Clinical history:41 year old with a large abdominal abscess along the left side of the abdominal cavity.  The patient already has a percutaneous drain in place.  PROCEDURE(S): INJECTION OF EXISTING CATHETER WITH FLUOROSCOPY; REPOSITIONING AND EXCHANGE OF DRAINAGE CATHETERS WITH FLUOROSCOPY  Physician: Rachelle Hora. Henn, MD  Medications:Versed 3 mg, Fentanyl 200 mcg.  A radiology nurse monitored the patient for moderate sedation.  Moderate sedation time:40 minutes  Fluoroscopy time: 9.0 minutes  Procedure:The procedure was explained to the patient.  The risks and benefits of the procedure were discussed and the  patient's questions were addressed.  Informed consent was obtained from the patient.  The patient was placed supine on the interventional table.  Contrast was injected through the existing catheter.  The catheter and surrounding skin were prepped and draped in a sterile fashion.  Maximal barrier sterile technique was utilized including caps, mask, sterile gowns, sterile gloves, sterile drape, hand hygiene and skin antiseptic.  Catheter was cut and removed over a Bentson wire.  Kumpe catheter was used to advance the Bentson wire into the superior aspect of the collection and the wire eventually advanced all the way down into the lower abdomen.  A new 12-French biliary drain was placed and a large volume of light brown fluid was removed.  Eventually, this catheter were removed over the [facility] wire and exchanged for a 7-French vascular sheath.  A second Bentson wire was placed.  Using a Kumpe catheter, one wire was advanced to the superior aspect of the collection and the second wire was advanced into the inferior aspect of collection.  A 12- [place] biliary drain was placed over each wire.  A total of 700 ml of light brown purulent fluid was removed between both catheters. Catheter were sutured to the skin and attached to suction bulbs.Fluoroscopic images were taken and saved for this procedure.  Findings:The patient has a massive left sided abdominal abscess based on previous CT and ultrasound imaging.  Only a small pocket of the collection was filling with contrast from the existing catheter.  Wires were advanced to the superior and inferior aspects of the collection  and two drains were placed.  700 ml of purulent fluid removed.  Complications: None  Impression:The old 12-French catheter was exchanged for two 12 French catheters that were positioned within the superior and inferior aspect of the large abscess collection. 700 ml of purulent fluid was removed.   Original Report Authenticated By: Richarda Overlie, M.D.      Anti-infectives: Anti-infectives     Start     Dose/Rate Route Frequency Ordered Stop   02/03/12 1800  piperacillin-tazobactam (ZOSYN) IVPB 2.25 g       2.25 g 100 mL/hr over 30 Minutes Intravenous 4 times per day 02/03/12 0924     02/01/12 2300   vancomycin (VANCOCIN) 1,500 mg in sodium chloride 0.9 % 500 mL IVPB  Status:  Discontinued        1,500 mg 250 mL/hr over 120 Minutes Intravenous Every 12 hours 02/01/12 1355 02/02/12 0924   02/01/12 0800   piperacillin-tazobactam (ZOSYN) IVPB 3.375 g  Status:  Discontinued        3.375 g 12.5 mL/hr over 240 Minutes Intravenous Every 8 hours 02/01/12 0724 02/03/12 0924   01/31/12 1000   ciprofloxacin (CIPRO) tablet 500 mg  Status:  Discontinued        500 mg Oral 2 times daily 01/31/12 0950 02/01/12 0724   01/28/12 2300   vancomycin (VANCOCIN) 2,000 mg in sodium chloride 0.9 % 500 mL IVPB  Status:  Discontinued        2,000 mg 250 mL/hr over 120 Minutes Intravenous Every 12 hours 01/27/12 2239 01/27/12 2243   01/28/12 1200   vancomycin (VANCOCIN) 2,000 mg in sodium chloride 0.9 % 500 mL IVPB  Status:  Discontinued        2,000 mg 250 mL/hr over 120 Minutes Intravenous Every 12 hours 01/27/12 2233 01/27/12 2238   01/27/12 2300   vancomycin (VANCOCIN) 750 mg in sodium chloride 0.9 % 150 mL IVPB  Status:  Discontinued        750 mg 150 mL/hr over 60 Minutes Intravenous  Once 01/27/12 2232 01/27/12 2233   01/27/12 2300   vancomycin (VANCOCIN) 2,000 mg in sodium chloride 0.9 % 500 mL IVPB  Status:  Discontinued        2,000 mg 250 mL/hr over 120 Minutes Intravenous Every 12 hours 01/27/12 2244 02/01/12 1355   01/25/12 1045   vancomycin (VANCOCIN) 1,250 mg in sodium chloride 0.9 % 250 mL IVPB  Status:  Discontinued        1,250 mg 166.7 mL/hr over 90 Minutes Intravenous 2 times daily 01/25/12 1020 01/27/12 2237   01/23/12 1300   micafungin (MYCAMINE) 100 mg in sodium chloride 0.9 % 100 mL IVPB        100 mg 100 mL/hr over 1 Hours  Intravenous Daily 01/23/12 1158 01/30/12 1226   01/16/12 1800  piperacillin-tazobactam (ZOSYN) IVPB 3.375 g       3.375 g 12.5 mL/hr over 240 Minutes Intravenous 3 times per day 01/16/12 1721 01/27/12 1012          Assessment/Plan: s/p Procedure(s): PARTIAL COLECTOMY COLOSTOMY Abdominal abscess, status post percutaneous drain x2.-Continue Zosyn and drainage. Repeat CT scan later this week. ARF. Creatinine continues to rise but very good urine output and hopefully will plateau soon. Renal service following.  LOS: 20 days    Zacharia Sowles T 02/05/2012

## 2012-02-05 NOTE — Progress Notes (Signed)
This patient is receiving IV Protonix. Based on criteria approved by the Pharmacy and Therapeutics Committee, this medication is being converted to the equivalent oral dose form. These criteria include:   . The patient is eating (either orally or per tube) and/or has been taking other orally administered medications for at least 24 hours.  . This patient has no evidence of active gastrointestinal bleeding or impaired GI absorption (gastrectomy, short bowel, patient on TNA or NPO).   If you have questions about this conversion, please contact the pharmacy department.  [name], [facility] 02/05/2012 1:39 PM

## 2012-02-05 NOTE — Progress Notes (Signed)
[facility] ROUNDING NOTE   Subjective:   Interval History: resting with no complaints  Objective:  Vital signs in last 24 hours:  Temp:  [98.3 F (36.8 C)-100.1 F (37.8 C)] 99.1 F (37.3 C) (10/20 0500) Pulse Rate:  [78-96] 89  (10/20 0500) Resp:  [16-18] 18  (10/20 0500) BP: (117-143)/(72-82) 143/82 mmHg (10/20 0500) SpO2:  [95 %-97 %] 97 % (10/20 0500)  Weight change:  Filed Weights   02/02/12 0211 02/02/12 0500 02/04/12 0500  Weight: 129.2 kg (284 lb 13.4 oz) 127.7 kg (281 lb 8.4 oz) 130 kg (286 lb 9.6 oz)    Intake/Output: I/O last 3 completed shifts: In: 4773.3 [P.O.:840; I.V.:2968.3; WUJWJ:191; IV Piggyback:110] Out: 2220 [Urine:2025; Drains:94; Stool:101]   Intake/Output this shift:     CVS- RRR RS- CTA ABD- BS present soft non-distended EXT- no edema   Basic Metabolic Panel:  Lab 02/05/12 4782 02/04/12 0500 02/03/12 0605 02/02/12 0810 02/02/12 0545 01/30/12 0629  NA 133* 128* 127* 131* 130* --  K 4.6 4.5 4.8 4.2 4.2 --  CL 101 97 95* 99 98 --  CO2 16* 18* 20 21 21  --  GLUCOSE 89 80 118* 103* 117* --  BUN 40* 33* 26* 17 16 --  CREATININE 6.65* 5.74* 4.48* 2.55* 2.29* --  CALCIUM 7.6* 7.6* 8.0* -- -- --  MG -- -- 1.9 -- -- 2.3  PHOS 5.9* 6.1* 4.7* -- -- 2.8    Liver Function Tests:  Lab 02/05/12 0438 02/04/12 0500 01/30/12 0629  AST -- -- 49*  ALT -- -- 89*  ALKPHOS -- -- 70  BILITOT -- -- 0.2*  PROT -- -- 6.0  ALBUMIN 1.6* 1.6* 1.8*   No results found for this basename: LIPASE:5,AMYLASE:5 in the last 168 hours No results found for this basename: AMMONIA:3 in the last 168 hours  CBC:  Lab 02/04/12 0606 02/01/12 0455 01/30/12 0629  WBC 6.1 9.9 7.6  NEUTROABS -- -- 4.7  HGB 9.7* 10.7* 10.9*  HCT 29.6* 32.4* 32.6*  MCV 90.8 92.3 91.6  PLT 370 465* 494*    Cardiac Enzymes: No results found for this basename: CKTOTAL:5,CKMB:5,CKMBINDEX:5,TROPONINI:5 in the last 168 hours  BNP: No components found with this basename:  POCBNP:5  CBG:  Lab 01/30/12 2024 01/30/12 1202 01/30/12 0735 01/30/12 0438 01/29/12 2344  GLUCAP 147* 184* 145* 171* 147*    Microbiology: Results for orders placed during the hospital encounter of 01/16/12  SURGICAL PCR SCREEN     Status: Normal   Collection Time   01/17/12  8:28 AM      Component Value Range Status Comment   MRSA, PCR NEGATIVE  NEGATIVE Final    Staphylococcus aureus NEGATIVE  NEGATIVE Final   CULTURE, BLOOD (ROUTINE X 2)     Status: Normal   Collection Time   01/19/12 10:46 AM      Component Value Range Status Comment   Specimen Description BLOOD LEFT ARM   Final    Special Requests BOTTLES DRAWN AEROBIC AND ANAEROBIC 10CC   Final    Culture  Setup Time 01/19/2012 16:55   Final    Culture NO GROWTH 5 DAYS   Final    Report Status 01/25/2012 FINAL   Final   CULTURE, BLOOD (ROUTINE X 2)     Status: Normal   Collection Time   01/19/12 10:48 AM      Component Value Range Status Comment   Specimen Description BLOOD LEFT ARM   Final    Special  Requests BOTTLES DRAWN AEROBIC AND ANAEROBIC 10CC   Final    Culture  Setup Time 01/19/2012 16:55   Final    Culture NO GROWTH 5 DAYS   Final    Report Status 01/25/2012 FINAL   Final   CULTURE, ROUTINE-ABSCESS     Status: Normal   Collection Time   01/24/12  1:10 PM      Component Value Range Status Comment   Specimen Description PERITONEAL CAVITY   Final    Special Requests NONE   Final    Gram Stain     Final    Value: FEW WBC PRESENT, PREDOMINANTLY PMN     NO SQUAMOUS EPITHELIAL CELLS SEEN     ABUNDANT GRAM NEGATIVE RODS     ABUNDANT GRAM POSITIVE RODS     FEW GRAM VARIABLE ROD   Culture ABUNDANT ENTEROCOCCUS SPECIES   Final    Report Status 01/27/2012 FINAL   Final    Organism ID, Bacteria ENTEROCOCCUS SPECIES   Final   CULTURE, ROUTINE-ABSCESS     Status: Normal   Collection Time   01/24/12  2:07 PM      Component Value Range Status Comment   Specimen Description PERITONEAL CAVITY   Final    Special Requests  NONE   Final    Gram Stain     Final    Value: RARE WBC PRESENT, PREDOMINANTLY PMN     NO SQUAMOUS EPITHELIAL CELLS SEEN     ABUNDANT GRAM POSITIVE RODS     MODERATE GRAM NEGATIVE RODS     MODERATE GRAM VARIABLE ROD   Culture ABUNDANT ENTEROCOCCUS SPECIES   Final    Report Status 01/27/2012 FINAL   Final    Organism ID, Bacteria ENTEROCOCCUS SPECIES   Final   BODY FLUID CULTURE     Status: Normal   Collection Time   01/31/12  5:41 PM      Component Value Range Status Comment   Specimen Description ABSCESS   Final    Special Requests Normal   Final    Gram Stain     Final    Value: ABUNDANT WBC PRESENT,BOTH PMN AND MONONUCLEAR     ABUNDANT GRAM NEGATIVE RODS     Gram Stain Report Called to,Read Back By and Verified With: Gram Stain Report Called to,Read Back By and Verified With: CLARISSA STATEN RN 02/01/12 8:15AM BY MILSH   Culture FEW KLEBSIELLA PNEUMONIAE   Final    Report Status 02/03/2012 FINAL   Final    Organism ID, Bacteria KLEBSIELLA PNEUMONIAE   Final     Coagulation Studies: No results found for this basename: LABPROT:5,INR:5 in the last 72 hours  Urinalysis:  Basename 02/02/12 1919  COLORURINE YELLOW  LABSPEC 1.012  PHURINE 5.5  GLUCOSEU NEGATIVE  HGBUR TRACE*  BILIRUBINUR NEGATIVE  KETONESUR NEGATIVE  PROTEINUR 30*  UROBILINOGEN 0.2  NITRITE NEGATIVE  LEUKOCYTESUR NEGATIVE      Imaging: Ir Catheter Tube Change  02/03/2012  *RADIOLOGY REPORT*  Clinical history:41 year old with a large abdominal abscess along the left side of the abdominal cavity.  The patient already has a percutaneous drain in place.  PROCEDURE(S): INJECTION OF EXISTING CATHETER WITH FLUOROSCOPY; REPOSITIONING AND EXCHANGE OF DRAINAGE CATHETERS WITH FLUOROSCOPY  Physician: Rachelle Hora. Henn, MD  Medications:Versed 3 mg, Fentanyl 200 mcg.  A radiology nurse monitored the patient for moderate sedation.  Moderate sedation time:40 minutes  Fluoroscopy time: 9.0 minutes  Procedure:The procedure was  explained to the patient.  The risks  and benefits of the procedure were discussed and the patient's questions were addressed.  Informed consent was obtained from the patient.  The patient was placed supine on the interventional table.  Contrast was injected through the existing catheter.  The catheter and surrounding skin were prepped and draped in a sterile fashion.  Maximal barrier sterile technique was utilized including caps, mask, sterile gowns, sterile gloves, sterile drape, hand hygiene and skin antiseptic.  Catheter was cut and removed over a Bentson wire.  Kumpe catheter was used to advance the Bentson wire into the superior aspect of the collection and the wire eventually advanced all the way down into the lower abdomen.  A new 12-French biliary drain was placed and a large volume of light brown fluid was removed.  Eventually, this catheter were removed over the [facility] wire and exchanged for a 7-French vascular sheath.  A second Bentson wire was placed.  Using a Kumpe catheter, one wire was advanced to the superior aspect of the collection and the second wire was advanced into the inferior aspect of collection.  A 12- [place] biliary drain was placed over each wire.  A total of 700 ml of light brown purulent fluid was removed between both catheters. Catheter were sutured to the skin and attached to suction bulbs.Fluoroscopic images were taken and saved for this procedure.  Findings:The patient has a massive left sided abdominal abscess based on previous CT and ultrasound imaging.  Only a small pocket of the collection was filling with contrast from the existing catheter.  Wires were advanced to the superior and inferior aspects of the collection and two drains were placed.  700 ml of purulent fluid removed.  Complications: None  Impression:The old 12-French catheter was exchanged for two 12 French catheters that were positioned within the superior and inferior aspect of the large abscess collection. 700 ml  of purulent fluid was removed.   Original Report Authenticated By: Richarda Overlie, M.D.      Medications:      . sodium chloride 100 mL/hr at 02/05/12 0726      . enoxaparin  30 mg Subcutaneous Q24H  . feeding supplement  237 mL Oral BID BM  . pantoprazole (PROTONIX) IV  40 mg Intravenous QHS  . piperacillin-tazobactam (ZOSYN)  IV  2.25 g Intravenous Q6H  . sodium chloride  10-40 mL Intracatheter Q12H  . sodium hypochlorite   Irrigation BID  . DISCONTD: sodium chloride irrigation  200 mL Per Tube Once   acetaminophen (TYLENOL) oral liquid 160 mg/5 mL, albuterol-ipratropium, fentaNYL, hydrALAZINE, LORazepam, magic mouthwash, ondansetron, oxyCODONE-acetaminophen, sodium chloride  Assessment/ Plan:  1. Acute renal failure -Normal baseline renal function , he received 80 cc radiocontrast but his creatinine was already elevated at 2.3 prior that procedure. Place foley catheter to better monitor output. Worsening renal failure still suspect ATN. Vanco.level was 47 2. Perforated diverticulum/S./P. colectomy, colostomy and drain placement-per surgery.  3. Past history urinary retention with Urethral stricture-see 1 above.  4. Possible ATN. Discussed with patient possibility of needing dilaysis.  Creatinine continues to worsen but good urine output and fluid balance. Hemodynamically stable and anticipate slow recovery   LOS: 20 Ermine Spofford W @TODAY @11 :01 AM

## 2012-02-06 LAB — RENAL FUNCTION PANEL
Albumin: 1.6 g/dL — ABNORMAL LOW (ref 3.5–5.2)
GFR calc Af Amer: 10 mL/min — ABNORMAL LOW (ref >90)
Glucose, Bld: 90 mg/dL (ref 70–99)
Phosphorus: 6.7 mg/dL — ABNORMAL HIGH (ref 2.3–4.6)
Potassium: 4.9 mEq/L (ref 3.5–5.1)
Sodium: 135 mEq/L (ref 135–145)

## 2012-02-06 MED ORDER — FUROSEMIDE 10 MG/ML IJ SOLN
80.0000 mg | Freq: Once | INTRAMUSCULAR | Status: AC
  Administered 2012-02-06: 80 mg via INTRAVENOUS
  Filled 2012-02-06: qty 8

## 2012-02-06 MED ORDER — GATORADE (BH)
240.0000 mL | Freq: Four times a day (QID) | ORAL | Status: DC
  Administered 2012-02-06 – 2012-02-12 (×12): 240 mL via ORAL
  Filled 2012-02-06: qty 480

## 2012-02-06 MED ORDER — SODIUM CHLORIDE 0.9 % IV SOLN
INTRAVENOUS | Status: DC
  Administered 2012-02-07 – 2012-02-10 (×4): via INTRAVENOUS

## 2012-02-06 MED ORDER — PIPERACILLIN-TAZOBACTAM IN DEX 2-0.25 GM/50ML IV SOLN
2.2500 g | Freq: Three times a day (TID) | INTRAVENOUS | Status: DC
  Administered 2012-02-06 – 2012-02-10 (×12): 2.25 g via INTRAVENOUS
  Filled 2012-02-06 (×13): qty 50

## 2012-02-06 NOTE — Progress Notes (Signed)
ANTIBIOTIC CONSULT NOTE  Pharmacy Consult for Zosyn Indication: Diverticular Abscess  No Known Allergies  Patient Measurements: Height: 5\' 10"  (177.8 cm) Weight: 293 lb 14 oz (133.3 kg) IBW/kg (Calculated) : 73   Vital Signs: Temp: 97.7 F (36.5 C) (10/21 0549) Temp src: Oral (10/21 0549) BP: 129/84 mmHg (10/21 0549) Pulse Rate: 82  (10/21 0549) Intake/Output from previous day: 10/20 0701 - 10/21 0700 In: 4213.3 [I.V.:3123.3; IV Piggyback:400] Out: 2370 [Urine:2000; Drains:70; Stool:300] Intake/Output from this shift:    Labs:  Basename 02/06/12 0350 02/05/12 0438 02/04/12 0606 02/04/12 0500  WBC -- -- 6.1 --  HGB -- -- 9.7* --  PLT -- -- 370 --  LABCREA -- -- -- --  CREATININE 7.12* 6.65* -- 5.74*   Estimated Creatinine Clearance: 18.9 ml/min (by C-G formula based on Cr of 7.12).   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 CT on 10/7 with large fluid collection, percutaneous drain placed 10/8.  Patient transferred back to floor 10/13.  Acute renal failure developed while on vancomycin (now [facility]).  SCr still rising; CrCl now < 20 mL/min; nephrology following.  D6 Zosyn 2.25 grams IV q6h for diverticular abscess (culture from peritoneal drain grew Klebsiella).  Goal of Therapy:  Zosyn adjusted for renal function  Plan:  1. Reduce Zosyn to 2.25 grams IV q8h for CrCl < 50mL/min 2. Follow SCr.   Hope Budds, PharmD, BCPS Pager: [phone] 02/06/2012,10:32 AM

## 2012-02-06 NOTE — Progress Notes (Signed)
Patient ID: [name], male   DOB: [birth year], 41 y.o.   MRN: [id number] 20 Days Post-Op  Subjective: Pt feels ok today.  No new complaints.  Just got done walking.  Says he's had a little harder time with this since Friday for some reason.    Objective: Vital signs in last 24 hours: Temp:  [97.7 F (36.5 C)-100.5 F (38.1 C)] 97.7 F (36.5 C) (10/21 0549) Pulse Rate:  [82-90] 82  (10/21 0549) Resp:  [18] 18  (10/21 0549) BP: (129-155)/(83-96) 129/84 mmHg (10/21 0549) SpO2:  [98 %] 98 % (10/21 0549) Weight:  [293 lb 14 oz (133.3 kg)] 293 lb 14 oz (133.3 kg) (10/21 0500) Last BM Date: 02/06/12  Intake/Output from previous day: 10/20 0701 - 10/21 0700 In: 4213.3 [I.V.:3123.3; IV Piggyback:400] Out: 2370 [Urine:2000; Drains:70; Stool:300] Intake/Output this shift: Total I/O In: 40 [I.V.:20; Other:20] Out: 690 [Urine:650; Drains:40]  PE: Abd: soft, wound is almost 100% clean.  Ostomy with thick liquids output.  Stoma not visible currently secondary to bag.  Both JP drains with tan cloudy purulent material.  Lab Results:   Basename 02/04/12 0606  WBC 6.1  HGB 9.7*  HCT 29.6*  PLT 370   BMET  Basename 02/06/12 0350 02/05/12 0438  NA 135 133*  K 4.9 4.6  CL 105 101  CO2 16* 16*  GLUCOSE 90 89  BUN 44* 40*  CREATININE 7.12* 6.65*  CALCIUM 7.6* 7.6*   PT/INR No results found for this basename: LABPROT:2,INR:2 in the last 72 hours CMP     Component Value Date/Time   NA 135 02/06/2012 0350   K 4.9 02/06/2012 0350   CL 105 02/06/2012 0350   CO2 16* 02/06/2012 0350   GLUCOSE 90 02/06/2012 0350   BUN 44* 02/06/2012 0350   CREATININE 7.12* 02/06/2012 0350   CALCIUM 7.6* 02/06/2012 0350   PROT 6.0 01/30/2012 0629   ALBUMIN 1.6* 02/06/2012 0350   AST 49* 01/30/2012 0629   ALT 89* 01/30/2012 0629   ALKPHOS 70 01/30/2012 0629   BILITOT 0.2* 01/30/2012 0629   GFRNONAA 9* 02/06/2012 0350   GFRAA 10* 02/06/2012 0350   Lipase     Component Value Date/Time   LIPASE 24 01/16/2012 1210       Studies/Results: No results found.  Anti-infectives: Anti-infectives     Start     Dose/Rate Route Frequency Ordered Stop   02/06/12 1400  piperacillin-tazobactam (ZOSYN) IVPB 2.25 g       2.25 g 100 mL/hr over 30 Minutes Intravenous 3 times per day 02/06/12 1043     02/03/12 1800   piperacillin-tazobactam (ZOSYN) IVPB 2.25 g  Status:  Discontinued        2.25 g 100 mL/hr over 30 Minutes Intravenous 4 times per day 02/03/12 0924 02/06/12 1043   02/01/12 2300   vancomycin (VANCOCIN) 1,500 mg in sodium chloride 0.9 % 500 mL IVPB  Status:  Discontinued        1,500 mg 250 mL/hr over 120 Minutes Intravenous Every 12 hours 02/01/12 1355 02/02/12 0924   02/01/12 0800   piperacillin-tazobactam (ZOSYN) IVPB 3.375 g  Status:  Discontinued        3.375 g 12.5 mL/hr over 240 Minutes Intravenous Every 8 hours 02/01/12 0724 02/03/12 0924   01/31/12 1000   ciprofloxacin (CIPRO) tablet 500 mg  Status:  Discontinued        500 mg Oral 2 times daily 01/31/12 0950 02/01/12 0724   01/28/12 2300  vancomycin (VANCOCIN) 2,000 mg in sodium chloride 0.9 % 500 mL IVPB  Status:  Discontinued        2,000 mg 250 mL/hr over 120 Minutes Intravenous Every 12 hours 01/27/12 2239 01/27/12 2243   01/28/12 1200   vancomycin (VANCOCIN) 2,000 mg in sodium chloride 0.9 % 500 mL IVPB  Status:  Discontinued        2,000 mg 250 mL/hr over 120 Minutes Intravenous Every 12 hours 01/27/12 2233 01/27/12 2238   01/27/12 2300   vancomycin (VANCOCIN) 750 mg in sodium chloride 0.9 % 150 mL IVPB  Status:  Discontinued        750 mg 150 mL/hr over 60 Minutes Intravenous  Once 01/27/12 2232 01/27/12 2233   01/27/12 2300   vancomycin (VANCOCIN) 2,000 mg in sodium chloride 0.9 % 500 mL IVPB  Status:  Discontinued        2,000 mg 250 mL/hr over 120 Minutes Intravenous Every 12 hours 01/27/12 2244 02/01/12 1355   01/25/12 1045   vancomycin (VANCOCIN) 1,250 mg in sodium chloride 0.9 % 250 mL  IVPB  Status:  Discontinued        1,250 mg 166.7 mL/hr over 90 Minutes Intravenous 2 times daily 01/25/12 1020 01/27/12 2237   01/23/12 1300   micafungin (MYCAMINE) 100 mg in sodium chloride 0.9 % 100 mL IVPB        100 mg 100 mL/hr over 1 Hours Intravenous Daily 01/23/12 1158 01/30/12 1226   01/16/12 1800  piperacillin-tazobactam (ZOSYN) IVPB 3.375 g       3.375 g 12.5 mL/hr over 240 Minutes Intravenous 3 times per day 01/16/12 1721 01/27/12 1012           Assessment/Plan  1. 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 intra-abdominal abscess, likely secondary to rectal stump leak 4. ARF with worsening Cr, but good UOP for now 5. Deconditioning, but improving 6. Wound dehiscence, but no evisceration   Plan: 1. Cont renal adjusted Zosyn to cover Klebsiella in drains 2. Cont WD dressing changes to wound BID   3. Appreciate renal following and assisting with ARF 4. Will need to evaluate stoma tomorrow with ostomy change 5. Will need repeat CT scan on Thursday to evaluate abscess, cont JP drains for now as they are still putting out quite a bit.  LOS: 21 days    Kollins Fenter E 02/06/2012, 11:05 AM Pager: [phone]

## 2012-02-06 NOTE — Progress Notes (Signed)
[facility] ROUNDING NOTE   Subjective:   Interval History: resting with no complaints  Objective:  Vital signs in last 24 hours:  Temp:  [97.7 F (36.5 C)-100.5 F (38.1 C)] 97.7 F (36.5 C) (10/21 0549) Pulse Rate:  [82-90] 82  (10/21 0549) Resp:  [18] 18  (10/21 0549) BP: (129-155)/(83-96) 129/84 mmHg (10/21 0549) SpO2:  [98 %] 98 % (10/21 0549) Weight:  [133.3 kg (293 lb 14 oz)] 133.3 kg (293 lb 14 oz) (10/21 0500)  Weight change:  Filed Weights   02/02/12 0500 02/04/12 0500 02/06/12 0500  Weight: 127.7 kg (281 lb 8.4 oz) 130 kg (286 lb 9.6 oz) 133.3 kg (293 lb 14 oz)    Intake/Output: I/O last 3 completed shifts: In: 5263.3 [P.O.:240; I.V.:3623.3; Other:1000; IV Piggyback:400] Out: 3024 [Urine:2650; Drains:74; Stool:300]  Basic Metabolic Panel:  Lab 02/06/12 4540 02/05/12 0438 02/04/12 0500 02/03/12 0605 02/02/12 0810  NA 135 133* 128* 127* 131*  K 4.9 4.6 4.5 4.8 4.2  CL 105 101 97 95* 99  CO2 16* 16* 18* 20 21  GLUCOSE 90 89 80 118* 103*  BUN 44* 40* 33* 26* 17  CREATININE 7.12* 6.65* 5.74* 4.48* 2.55*  CALCIUM 7.6* 7.6* 7.6* -- --  MG -- -- -- 1.9 --  PHOS 6.7* 5.9* 6.1* 4.7* --    Liver Function Tests:  Lab 02/06/12 0350 02/05/12 0438 02/04/12 0500  AST -- -- --  ALT -- -- --  ALKPHOS -- -- --  BILITOT -- -- --  PROT -- -- --  ALBUMIN 1.6* 1.6* 1.6*   No results found for this basename: LIPASE:5,AMYLASE:5 in the last 168 hours No results found for this basename: AMMONIA:3 in the last 168 hours  CBC:  Lab 02/04/12 0606 02/01/12 0455  WBC 6.1 9.9  NEUTROABS -- --  HGB 9.7* 10.7*  HCT 29.6* 32.4*  MCV 90.8 92.3  PLT 370 465*    Cardiac Enzymes: No results found for this basename: CKTOTAL:5,CKMB:5,CKMBINDEX:5,TROPONINI:5 in the last 168 hours  BNP: No components found with this basename: POCBNP:5  CBG:  Lab 01/30/12 2024  GLUCAP 147*    Microbiology: Results for orders placed during the hospital encounter of  01/16/12  SURGICAL PCR SCREEN     Status: Normal   Collection Time   01/17/12  8:28 AM      Component Value Range Status Comment   MRSA, PCR NEGATIVE  NEGATIVE Final    Staphylococcus aureus NEGATIVE  NEGATIVE Final   CULTURE, BLOOD (ROUTINE X 2)     Status: Normal   Collection Time   01/19/12 10:46 AM      Component Value Range Status Comment   Specimen Description BLOOD LEFT ARM   Final    Special Requests BOTTLES DRAWN AEROBIC AND ANAEROBIC 10CC   Final    Culture  Setup Time 01/19/2012 16:55   Final    Culture NO GROWTH 5 DAYS   Final    Report Status 01/25/2012 FINAL   Final   CULTURE, BLOOD (ROUTINE X 2)     Status: Normal   Collection Time   01/19/12 10:48 AM      Component Value Range Status Comment   Specimen Description BLOOD LEFT ARM   Final    Special Requests BOTTLES DRAWN AEROBIC AND ANAEROBIC 10CC   Final    Culture  Setup Time 01/19/2012 16:55   Final    Culture NO GROWTH 5 DAYS   Final    Report Status 01/25/2012  FINAL   Final   CULTURE, ROUTINE-ABSCESS     Status: Normal   Collection Time   01/24/12  1:10 PM      Component Value Range Status Comment   Specimen Description PERITONEAL CAVITY   Final    Special Requests NONE   Final    Gram Stain     Final    Value: FEW WBC PRESENT, PREDOMINANTLY PMN     NO SQUAMOUS EPITHELIAL CELLS SEEN     ABUNDANT GRAM NEGATIVE RODS     ABUNDANT GRAM POSITIVE RODS     FEW GRAM VARIABLE ROD   Culture ABUNDANT ENTEROCOCCUS SPECIES   Final    Report Status 01/27/2012 FINAL   Final    Organism ID, Bacteria ENTEROCOCCUS SPECIES   Final   CULTURE, ROUTINE-ABSCESS     Status: Normal   Collection Time   01/24/12  2:07 PM      Component Value Range Status Comment   Specimen Description PERITONEAL CAVITY   Final    Special Requests NONE   Final    Gram Stain     Final    Value: RARE WBC PRESENT, PREDOMINANTLY PMN     NO SQUAMOUS EPITHELIAL CELLS SEEN     ABUNDANT GRAM POSITIVE RODS     MODERATE GRAM NEGATIVE RODS     MODERATE  GRAM VARIABLE ROD   Culture ABUNDANT ENTEROCOCCUS SPECIES   Final    Report Status 01/27/2012 FINAL   Final    Organism ID, Bacteria ENTEROCOCCUS SPECIES   Final   BODY FLUID CULTURE     Status: Normal   Collection Time   01/31/12  5:41 PM      Component Value Range Status Comment   Specimen Description ABSCESS   Final    Special Requests Normal   Final    Gram Stain     Final    Value: ABUNDANT WBC PRESENT,BOTH PMN AND MONONUCLEAR     ABUNDANT GRAM NEGATIVE RODS     Gram Stain Report Called to,Read Back By and Verified With: Gram Stain Report Called to,Read Back By and Verified With: CLARISSA STATEN RN 02/01/12 8:15AM BY MILSH   Culture FEW KLEBSIELLA PNEUMONIAE   Final    Report Status 02/03/2012 FINAL   Final    Organism ID, Bacteria KLEBSIELLA PNEUMONIAE   Final     Coagulation Studies: No results found for this basename: LABPROT:5,INR:5 in the last 72 hours  Urinalysis: No results found for this basename: COLORURINE:2,APPERANCEUR:2,LABSPEC:2,PHURINE:2,GLUCOSEU:2,HGBUR:2,BILIRUBINUR:2,KETONESUR:2,PROTEINUR:2,UROBILINOGEN:2,NITRITE:2,LEUKOCYTESUR:2 in the last 72 hours    Imaging: No results found.   Medications:      . sodium chloride 100 mL/hr at 02/06/12 0800      . enoxaparin  30 mg Subcutaneous Q24H  . feeding supplement  237 mL Oral BID BM  . gatorade (BH)  240 mL Oral QID  . pantoprazole  40 mg Oral QHS  . piperacillin-tazobactam (ZOSYN)  IV  2.25 g Intravenous Q8H  . sodium chloride  10-40 mL Intracatheter Q12H  . sodium hypochlorite   Irrigation BID  . DISCONTD: pantoprazole (PROTONIX) IV  40 mg Intravenous QHS  . DISCONTD: piperacillin-tazobactam (ZOSYN)  IV  2.25 g Intravenous Q6H   acetaminophen (TYLENOL) oral liquid 160 mg/5 mL, albuterol-ipratropium, fentaNYL, hydrALAZINE, LORazepam, magic mouthwash, ondansetron, oxyCODONE-acetaminophen, sodium chloride  Exam: Gen- alert, no distress CVS- RRR RS- CTA bilat ABD- BS present soft non-distended, LLQ  drain x 2, LLQ colostomy EXT- no edema x 4 ext Neuro- Ox3, no asterixis  Assessment/ Plan:  1. Acute renal failure- ATN due to vanc toxicity +/- contrast injury.  Creat still rising, but rate of rise is slowing. No uremic signs or symptoms, lytes OK, making urine. No vol excess. Increase IVF 150 cc/hr, follow lytes daily, no dialysis needed yet. 2. Perforated diverticulum/S./P. colectomy, colostomy and drain placement-per surgery.  3. Past history urinary retention with Urethral stricture-see 1 above. Foley in place.  Vinson Moselle  MD [facility] [phone] pgr    [phone] cell 02/06/2012, 12:50 PM

## 2012-02-06 NOTE — Progress Notes (Signed)
Noticed increase in pedal edema. Now 3+ from 2+ this am. IVF at 169ml/hr. Patient complained of increased tightness in legs.  On Call notified. Will notify attending in am. No s/s of distress at this time.

## 2012-02-06 NOTE — Plan of Care (Signed)
Problem: Food- and Nutrition-Related Knowledge Deficit (NB-1.1) Goal: Nutrition education Formal process to instruct or train a patient/client in a skill or to impart knowledge to help patients/clients voluntarily manage or modify food choices and eating behavior to maintain or improve health.  Outcome: Completed/Met Date Met:  02/06/12 Knowledge deficit resolved with diet education on 10/21.  Comments:  Discussed and provided handout with family obtained from ADA nutrition care manual for colostomy nutrition therapy. They were without any further nutrition related questions. They verbalized understanding. Encouraged family to ask to speak to RD if future questions arise. Expect fair compliance.   RD available for nutrition needs.   [name], [name] [facility] [phone]

## 2012-02-06 NOTE — Progress Notes (Signed)
Physical Therapy Treatment Patient Details Name: [name] MRN: [id number] DOB: 1970/06/03 Today's Date: 02/06/2012 Time: 4098-1191 PT Time Calculation (min): 44 min  PT Assessment / Plan / Recommendation Comments on Treatment Session  continues to demonstrate general weakness and decreased activity tolerance. Mobility somewhat more diffcult for pt since 2nd drain placed-increased pain. Pt states that he will be sleeping in recliner at home. Encouraged pt to attempt to ambulate 3x/day if able.     Follow Up Recommendations  Home health PT;Supervision/Assistance - 24 hour     Does the patient have the potential to tolerate intense rehabilitation     Barriers to Discharge        Equipment Recommendations  Rolling walker with 5" wheels;3 in 1 bedside comode    Recommendations for Other Services    Frequency Min 3X/week   Plan Discharge plan remains appropriate    Precautions / Restrictions Precautions Precautions: Fall Precaution Comments: ABD binder, colostomy, multiple drains (2) L flank Restrictions Weight Bearing Restrictions: No   Pertinent Vitals/Pain 6-8/10 L flank    Mobility  Bed Mobility Bed Mobility: Rolling Right;Right Sidelying to Sit Rolling Right: 4: Min assist Right Sidelying to Sit: 4: Min assist Sit to Supine: 5: Supervision;HOB elevated Details for Bed Mobility Assistance: Increased time . VCs safety, technique (rolling). Pt states that he will be sleeping in recliner at home.  Transfers Transfers: Sit to Stand;Stand to Sit Sit to Stand: 4: Min guard;From bed;From elevated surface;With upper extremity assist Stand to Sit: 4: Min guard;To bed;With upper extremity assist Details for Transfer Assistance: Pt used RW to pull up on to stand while father stabilized. Increased time to rise, descend.  Ambulation/Gait Ambulation/Gait Assistance: 4: Min guard Ambulation Distance (Feet): 500 Feet Assistive device: Rolling walker Ambulation/Gait Assistance  Details: Used RW this session. Slow gait speed. Dsypnea 2-3/4 with ambulation. 2 short standing rest breaks. Pt relying on RW for support.  Gait Pattern: Step-through pattern;Decreased stride length;Wide base of support;Trunk flexed    Exercises     PT Diagnosis:    PT Problem List:   PT Treatment Interventions:     PT Goals Acute Rehab PT Goals Pt will Roll Supine to Right Side: with min assist PT Goal: Rolling Supine to Right Side - Progress: Progressing toward goal Pt will go Supine/Side to Sit: with min assist PT Goal: Supine/Side to Sit - Progress: Progressing toward goal Pt will go Sit to Supine/Side: with supervision PT Goal: Sit to Supine/Side - Progress: Progressing toward goal Pt will go Sit to Stand: with supervision PT Goal: Sit to Stand - Progress: Progressing toward goal Pt will go Stand to Sit: with supervision PT Goal: Stand to Sit - Progress: Progressing toward goal Pt will Ambulate: >150 feet;with supervision;with least restrictive assistive device PT Goal: Ambulate - Progress: Progressing toward goal  Visit Information  Last PT Received On: 02/06/12 Assistance Needed: +1 PT/OT Co-Evaluation/Treatment: Yes    Subjective Data  Subjective: "I've had this pain since I went back in for that other drain" Patient Stated Goal: Less pain. Home   Cognition  Overall Cognitive Status: Appears within functional limits for tasks assessed/performed Arousal/Alertness: Awake/alert Orientation Level: Appears intact for tasks assessed Behavior During Session: [name] for tasks performed    Balance     End of Session PT - End of Session Activity Tolerance: Patient limited by fatigue;Patient limited by pain Patient left: in bed;with call bell/phone within reach;with family/visitor present   GP     [name]  02/06/2012, 11:58 AM [phone]

## 2012-02-06 NOTE — Progress Notes (Signed)
Occupational Therapy Treatment Patient Details Name: [name] MRN: [id number] DOB: 1970-09-11 Today's Date: 02/06/2012 Time: 7829-5621 OT Time Calculation (min): 44 min  OT Assessment / Plan / Recommendation Comments on Treatment Session Pt limited by pain this session and moving very slowly with all activity. Pt states new L drain site is very painful/tender. Discussed needing 3in1 for home. Reviwed AE use verbally but still needs more hands on practice.     Follow Up Recommendations  Home health OT;Supervision/Assistance - 24 hour    Barriers to Discharge       Equipment Recommendations  Rolling walker with 5" wheels;3 in 1 bedside comode )elongated if possible.     Recommendations for Other Services    Frequency Min 2X/week   Plan Discharge plan remains appropriate    Precautions / Restrictions Precautions Precautions: Fall Precaution Comments: ABD binder, drain, colostomy Restrictions Weight Bearing Restrictions: No        ADL  Toilet Transfer: Simulated;Min guard Toilet Transfer Method: Other (comment) (simulated only. Up from EOB to ambulate and back to EOB) Equipment Used: Rolling walker ADL Comments: Pt requring increased time to complete all tasks today due to pain level. Pt reporting more pain/tenderness at new L drain site. Pt required time to prepare himself mentally for rolling to side and then increased time at EOB before standing. Pt fatigued with PT for ambulation and OT encouraged purse lip breathing as noted 1-2/4 dyspnea. Discussed abdominal binder and practiced bridging in bed to don binder. Started practicing getting up from mostly flat bed but then pt states he will be sleeping in a recliner and wife can help with donning binder. Also discussed LB dressing and he plans to have family purchase AE kit from gift shop. He states he remembers OT showing him some of the AE . Verbally reviewed how to use each piece of AE but pt needs to practice with it  hands on. Pt too fatigued and with too much pain to practice with AE today after much increased time to get OOB today.     OT Diagnosis:    OT Problem List:   OT Treatment Interventions:     OT Goals ADL Goals ADL Goal: Toilet Transfer - Progress: Progressing toward goals (simulated only)  Visit Information  Last OT Received On: 02/06/12 Assistance Needed: +1 PT/OT Co-Evaluation/Treatment: Yes    Subjective Data  Subjective: I havent been up much over the weekend Patient Stated Goal: to be able to get up and move around easier   Prior Functioning       Cognition  Overall Cognitive Status: Appears within functional limits for tasks assessed/performed Arousal/Alertness: Awake/alert Behavior During Session: [facility] for tasks performed    Mobility  Shoulder Instructions Bed Mobility Bed Mobility: Rolling Right;Right Sidelying to Sit Rolling Right: 4: Min assist;With rail Right Sidelying to Sit: 4: Min assist;With rails;Other (comment) (HOB only slightly elevated) Sit to Supine: 5: Supervision Details for Bed Mobility Assistance: much increased time for all bed mobility. Verbal cues for log roll technique for OOB. Transfers Transfers: Sit to Stand;Stand to Sit Sit to Stand: 4: Min guard;From bed Stand to Sit: 4: Min guard;To bed Details for Transfer Assistance: pt used RW to help push up to stand but pt's father stabilized RW for standing.        Exercises      Balance     End of Session OT - End of Session Activity Tolerance: Patient limited by pain Patient left: in bed;with call  bell/phone within reach  GO     Lennox Laity [phone] 02/06/2012, 11:04 AM

## 2012-02-07 LAB — RENAL FUNCTION PANEL
Albumin: 1.7 g/dL — ABNORMAL LOW (ref 3.5–5.2)
Calcium: 7.6 mg/dL — ABNORMAL LOW (ref 8.4–10.5)
GFR calc Af Amer: 10 mL/min — ABNORMAL LOW (ref >90)
GFR calc non Af Amer: 9 mL/min — ABNORMAL LOW (ref >90)
Phosphorus: 6.6 mg/dL — ABNORMAL HIGH (ref 2.3–4.6)
Sodium: 137 mEq/L (ref 135–145)

## 2012-02-07 NOTE — Consult Note (Signed)
WOC ostomy consult  Stoma type/location: LMQ Colostomy Stomal assessment/size: oval opening (1 and 1/8 inch x 1 and 3/4 inches) with retracted stoma 3cm below skin level.  Stoma is approximately 1-inch and matured.  Peristomal assessment: clear, intact Treatment options for stomal/peristomal skin: none indicated Output semi-formed stool (brown) Ostomy pouching: 1pc. With barrier ring.  Education provided: Patient, wife and father present for demonstration of pouch change.  Tracing of pattern, cutting out of skin barrier, placement of barrier ring, pouch, tape removal, pouch closure.  Asking appropriate questions.  Wife states she is able to prepare pouch. Demonstration of pouching emptying provided and patient instructed to empty when pouch is 1/3 to 1/2 full of stool or air.  Will ask nurses and nursing techs to assist patient with emptying pouch into commode today. WOC Team will follow with you. Thanks, Ladona Mow, MSN, RN, [facility], CWOCN [phone])

## 2012-02-07 NOTE — Progress Notes (Signed)
Physical Therapy Treatment Patient Details Name: [name] MRN: [id number] DOB: 1970-08-28 Today's Date: 02/07/2012 Time: [id number] PT Time Calculation (min): 38 min  PT Assessment / Plan / Recommendation Comments on Treatment Session  Continuing to progress slowly with mobility.     Follow Up Recommendations  Home health PT;Supervision/Assistance - 24 hour     Does the patient have the potential to tolerate intense rehabilitation     Barriers to Discharge        Equipment Recommendations  Rolling walker with 5" wheels;3 in 1 bedside comode    Recommendations for Other Services OT consult  Frequency Min 3X/week   Plan Discharge plan remains appropriate    Precautions / Restrictions Precautions Precautions: Fall Precaution Comments: ABD binder, colostoly, multiple drains (2) L flank Restrictions Weight Bearing Restrictions: No   Pertinent Vitals/Pain 6/10 abdomen/ L side    Mobility  Bed Mobility Bed Mobility: Supine to Sit Supine to Sit: HOB elevated;With rails;4: Min assist Details for Bed Mobility Assistance: Increased time. VCs safety, technique. Pt states he plans on sleeping in recliner at home so allowed [facility] to remain upright.  Transfers Transfers: Sit to Stand;Stand to Sit Sit to Stand: 4: Min guard;From bed Stand to Sit: 4: Min guard;To chair/3-in-1 Details for Transfer Assistance: VCs safety, hand placement. Pt prefers to pull up on RW to rise.  Ambulation/Gait Ambulation/Gait Assistance: 4: Min guard Ambulation Distance (Feet): 500 Feet Assistive device: Rolling walker Ambulation/Gait Assistance Details: Good gait speed. Pt able to ambulate full unit without standing rest break. Dyspnea 2/4.  Gait Pattern: Step-through pattern    Exercises General Exercises - Lower Extremity Ankle Circles/Pumps: AROM;Both;15 reps;Seated Quad Sets: AROM;Both;15 reps;Seated Long Arc Quad: AROM;15 reps;Seated;Both Hip ABduction/ADduction: AROM;Both;15 reps;Seated     PT Diagnosis:    PT Problem List:   PT Treatment Interventions:     PT Goals Acute Rehab PT Goals Pt will go Supine/Side to Sit: with supervision;with HOB not 0 degrees (comment degree) PT Goal: Supine/Side to Sit - Progress: Goal set today Pt will go Stand to Sit: with supervision PT Goal: Stand to Sit - Progress: Progressing toward goal Pt will Ambulate: >150 feet;with supervision;with least restrictive assistive device PT Goal: Ambulate - Progress: Progressing toward goal  Visit Information  Last PT Received On: 02/07/12 Assistance Needed: +1    Subjective Data  Subjective: "Its a little better today" Patient Stated Goal: Less pain. Home   Cognition  Overall Cognitive Status: Appears within functional limits for tasks assessed/performed Arousal/Alertness: Awake/alert Orientation Level: Appears intact for tasks assessed Behavior During Session: [facility] for tasks performed    Balance     End of Session PT - End of Session Activity Tolerance: Patient limited by fatigue;Patient limited by pain Patient left: in chair;with call bell/phone within reach;with family/visitor present   GP     [name] [facility] 02/07/2012, 11:19 AM [phone]

## 2012-02-07 NOTE — Progress Notes (Signed)
Patient ID: CADIN OETTING, male   DOB: May 03, 1970, 41 y.o.   MRN: [id number] 21 Days Post-Op  Subjective: Pt feeling well today.  Eating better.  No complaints.  Objective: Vital signs in last 24 hours: Temp:  [98.1 F (36.7 C)-99.9 F (37.7 C)] 99 F (37.2 C) (10/22 0612) Pulse Rate:  [86-88] 86  (10/22 0612) Resp:  [20] 20  (10/22 0612) BP: (150-169)/(80-82) 155/82 mmHg (10/22 0612) SpO2:  [100 %] 100 % (10/22 0612) Last BM Date: 02/06/12  Intake/Output from previous day: 10/21 0701 - 10/22 0700 In: 3708.8 [P.O.:1560; I.V.:2128.8] Out: 6527 [Urine:6250; Drains:97; Stool:180] Intake/Output this shift:    PE: Abd: stable, stoma actually looks better with better surrounding granulation tissue.  Stoma is patient.  Wound is stable and packed.  Drains with decrease in output.  Lab Results:  No results found for this basename: WBC:2,HGB:2,HCT:2,PLT:2 in the last 72 hours BMET  Basename 02/07/12 0630 02/06/12 0350  NA 137 135  K 4.6 4.9  CL 106 105  CO2 17* 16*  GLUCOSE 116* 90  BUN 46* 44*  CREATININE 7.04* 7.12*  CALCIUM 7.6* 7.6*   PT/INR No results found for this basename: LABPROT:2,INR:2 in the last 72 hours CMP     Component Value Date/Time   NA 137 02/07/2012 0630   K 4.6 02/07/2012 0630   CL 106 02/07/2012 0630   CO2 17* 02/07/2012 0630   GLUCOSE 116* 02/07/2012 0630   BUN 46* 02/07/2012 0630   CREATININE 7.04* 02/07/2012 0630   CALCIUM 7.6* 02/07/2012 0630   PROT 6.0 01/30/2012 0629   ALBUMIN 1.7* 02/07/2012 0630   AST 49* 01/30/2012 0629   ALT 89* 01/30/2012 0629   ALKPHOS 70 01/30/2012 0629   BILITOT 0.2* 01/30/2012 0629   GFRNONAA 9* 02/07/2012 0630   GFRAA 10* 02/07/2012 0630   Lipase     Component Value Date/Time   LIPASE 24 01/16/2012 1210       Studies/Results: No results found.  Anti-infectives: Anti-infectives     Start     Dose/Rate Route Frequency Ordered Stop   02/06/12 1400  piperacillin-tazobactam (ZOSYN) IVPB 2.25 g       2.25 g 100 mL/hr over 30 Minutes Intravenous 3 times per day 02/06/12 1043     02/03/12 1800   piperacillin-tazobactam (ZOSYN) IVPB 2.25 g  Status:  Discontinued        2.25 g 100 mL/hr over 30 Minutes Intravenous 4 times per day 02/03/12 0924 02/06/12 1043   02/01/12 2300   vancomycin (VANCOCIN) 1,500 mg in sodium chloride 0.9 % 500 mL IVPB  Status:  Discontinued        1,500 mg 250 mL/hr over 120 Minutes Intravenous Every 12 hours 02/01/12 1355 02/02/12 0924   02/01/12 0800   piperacillin-tazobactam (ZOSYN) IVPB 3.375 g  Status:  Discontinued        3.375 g 12.5 mL/hr over 240 Minutes Intravenous Every 8 hours 02/01/12 0724 02/03/12 0924   01/31/12 1000   ciprofloxacin (CIPRO) tablet 500 mg  Status:  Discontinued        500 mg Oral 2 times daily 01/31/12 0950 02/01/12 0724   01/28/12 2300   vancomycin (VANCOCIN) 2,000 mg in sodium chloride 0.9 % 500 mL IVPB  Status:  Discontinued        2,000 mg 250 mL/hr over 120 Minutes Intravenous Every 12 hours 01/27/12 2239 01/27/12 2243   01/28/12 1200   vancomycin (VANCOCIN) 2,000 mg in sodium chloride 0.9 % 500  mL IVPB  Status:  Discontinued        2,000 mg 250 mL/hr over 120 Minutes Intravenous Every 12 hours 01/27/12 2233 01/27/12 2238   01/27/12 2300   vancomycin (VANCOCIN) 750 mg in sodium chloride 0.9 % 150 mL IVPB  Status:  Discontinued        750 mg 150 mL/hr over 60 Minutes Intravenous  Once 01/27/12 2232 01/27/12 2233   01/27/12 2300   vancomycin (VANCOCIN) 2,000 mg in sodium chloride 0.9 % 500 mL IVPB  Status:  Discontinued        2,000 mg 250 mL/hr over 120 Minutes Intravenous Every 12 hours 01/27/12 2244 02/01/12 1355   01/25/12 1045   vancomycin (VANCOCIN) 1,250 mg in sodium chloride 0.9 % 250 mL IVPB  Status:  Discontinued        1,250 mg 166.7 mL/hr over 90 Minutes Intravenous 2 times daily 01/25/12 1020 01/27/12 2237   01/23/12 1300   micafungin (MYCAMINE) 100 mg in sodium chloride 0.9 % 100 mL IVPB        100  mg 100 mL/hr over 1 Hours Intravenous Daily 01/23/12 1158 01/30/12 1226   01/16/12 1800  piperacillin-tazobactam (ZOSYN) IVPB 3.375 g       3.375 g 12.5 mL/hr over 240 Minutes Intravenous 3 times per day 01/16/12 1721 01/27/12 1012           Assessment/Plan  1. S/p Hartman's 2. Possible rectal stump leak with abdominal abscess 3. ARF, Cr slightly down today, mostly stable :) 4. Wound dehiscence but no evisceration  Plan: 1. Cont current care.  Still on zosyn, at some point need to consider switching to oral abx therapy 2. Repeat CT scan on Thursday with ORAL contrast ONLY! 3. Cont drains 4. Continue to follow creatinine.  Good UOP and Cr hopefully stabilizing out today.    LOS: 22 days    Merit Maybee E 02/07/2012, 11:09 AM Pager: [phone]

## 2012-02-07 NOTE — Progress Notes (Signed)
[facility] ROUNDING NOTE   Subjective:   Interval History: resting with no complaints  Objective:  Vital signs in last 24 hours:  Temp:  [98.1 F (36.7 C)-99.9 F (37.7 C)] 99 F (37.2 C) (10/22 0612) Pulse Rate:  [86-88] 86  (10/22 0612) Resp:  [20] 20  (10/22 0612) BP: (150-169)/(80-82) 155/82 mmHg (10/22 0612) SpO2:  [100 %] 100 % (10/22 0612)  Weight change:  Filed Weights   02/02/12 0500 02/04/12 0500 02/06/12 0500  Weight: 127.7 kg (281 lb 8.4 oz) 130 kg (286 lb 9.6 oz) 133.3 kg (293 lb 14 oz)    Intake/Output: I/O last 3 completed shifts: In: 5072.1 [P.O.:1560; I.V.:3352.1; Other:60; IV Piggyback:100] Out: 8747 [Urine:8250; Drains:167; Stool:330]  Basic Metabolic Panel:  Lab 02/07/12 9562 02/06/12 0350 02/05/12 0438 02/04/12 0500 02/03/12 0605  NA 137 135 133* 128* 127*  K 4.6 4.9 4.6 4.5 4.8  CL 106 105 101 97 95*  CO2 17* 16* 16* 18* 20  GLUCOSE 116* 90 89 80 118*  BUN 46* 44* 40* 33* 26*  CREATININE 7.04* 7.12* 6.65* 5.74* 4.48*  CALCIUM 7.6* 7.6* 7.6* -- --  MG -- -- -- -- 1.9  PHOS 6.6* 6.7* 5.9* 6.1* 4.7*    Liver Function Tests:  Lab 02/07/12 0630 02/06/12 0350 02/05/12 0438 02/04/12 0500  AST -- -- -- --  ALT -- -- -- --  ALKPHOS -- -- -- --  BILITOT -- -- -- --  PROT -- -- -- --  ALBUMIN 1.7* 1.6* 1.6* 1.6*   No results found for this basename: LIPASE:5,AMYLASE:5 in the last 168 hours No results found for this basename: AMMONIA:3 in the last 168 hours  CBC:  Lab 02/04/12 0606 02/01/12 0455  WBC 6.1 9.9  NEUTROABS -- --  HGB 9.7* 10.7*  HCT 29.6* 32.4*  MCV 90.8 92.3  PLT 370 465*    Cardiac Enzymes: No results found for this basename: CKTOTAL:5,CKMB:5,CKMBINDEX:5,TROPONINI:5 in the last 168 hours  BNP: No components found with this basename: POCBNP:5  CBG: No results found for this basename: GLUCAP:5 in the last 168 hours  Microbiology: Results for orders placed during the hospital encounter of 01/16/12    SURGICAL PCR SCREEN     Status: Normal   Collection Time   01/17/12  8:28 AM      Component Value Range Status Comment   MRSA, PCR NEGATIVE  NEGATIVE Final    Staphylococcus aureus NEGATIVE  NEGATIVE Final   CULTURE, BLOOD (ROUTINE X 2)     Status: Normal   Collection Time   01/19/12 10:46 AM      Component Value Range Status Comment   Specimen Description BLOOD LEFT ARM   Final    Special Requests BOTTLES DRAWN AEROBIC AND ANAEROBIC 10CC   Final    Culture  Setup Time 01/19/2012 16:55   Final    Culture NO GROWTH 5 DAYS   Final    Report Status 01/25/2012 FINAL   Final   CULTURE, BLOOD (ROUTINE X 2)     Status: Normal   Collection Time   01/19/12 10:48 AM      Component Value Range Status Comment   Specimen Description BLOOD LEFT ARM   Final    Special Requests BOTTLES DRAWN AEROBIC AND ANAEROBIC 10CC   Final    Culture  Setup Time 01/19/2012 16:55   Final    Culture NO GROWTH 5 DAYS   Final    Report Status 01/25/2012 FINAL   Final  CULTURE, ROUTINE-ABSCESS     Status: Normal   Collection Time   01/24/12  1:10 PM      Component Value Range Status Comment   Specimen Description PERITONEAL CAVITY   Final    Special Requests NONE   Final    Gram Stain     Final    Value: FEW WBC PRESENT, PREDOMINANTLY PMN     NO SQUAMOUS EPITHELIAL CELLS SEEN     ABUNDANT GRAM NEGATIVE RODS     ABUNDANT GRAM POSITIVE RODS     FEW GRAM VARIABLE ROD   Culture ABUNDANT ENTEROCOCCUS SPECIES   Final    Report Status 01/27/2012 FINAL   Final    Organism ID, Bacteria ENTEROCOCCUS SPECIES   Final   CULTURE, ROUTINE-ABSCESS     Status: Normal   Collection Time   01/24/12  2:07 PM      Component Value Range Status Comment   Specimen Description PERITONEAL CAVITY   Final    Special Requests NONE   Final    Gram Stain     Final    Value: RARE WBC PRESENT, PREDOMINANTLY PMN     NO SQUAMOUS EPITHELIAL CELLS SEEN     ABUNDANT GRAM POSITIVE RODS     MODERATE GRAM NEGATIVE RODS     MODERATE GRAM  VARIABLE ROD   Culture ABUNDANT ENTEROCOCCUS SPECIES   Final    Report Status 01/27/2012 FINAL   Final    Organism ID, Bacteria ENTEROCOCCUS SPECIES   Final   BODY FLUID CULTURE     Status: Normal   Collection Time   01/31/12  5:41 PM      Component Value Range Status Comment   Specimen Description ABSCESS   Final    Special Requests Normal   Final    Gram Stain     Final    Value: ABUNDANT WBC PRESENT,BOTH PMN AND MONONUCLEAR     ABUNDANT GRAM NEGATIVE RODS     Gram Stain Report Called to,Read Back By and Verified With: Gram Stain Report Called to,Read Back By and Verified With: CLARISSA STATEN RN 02/01/12 8:15AM BY MILSH   Culture FEW KLEBSIELLA PNEUMONIAE   Final    Report Status 02/03/2012 FINAL   Final    Organism ID, Bacteria KLEBSIELLA PNEUMONIAE   Final     Coagulation Studies: No results found for this basename: LABPROT:5,INR:5 in the last 72 hours  Urinalysis: No results found for this basename: COLORURINE:2,APPERANCEUR:2,LABSPEC:2,PHURINE:2,GLUCOSEU:2,HGBUR:2,BILIRUBINUR:2,KETONESUR:2,PROTEINUR:2,UROBILINOGEN:2,NITRITE:2,LEUKOCYTESUR:2 in the last 72 hours    Imaging: No results found.   Medications:      . sodium chloride 75 mL/hr at 02/07/12 0209  . DISCONTD: sodium chloride 150 mL/hr at 02/06/12 1653      . enoxaparin  30 mg Subcutaneous Q24H  . feeding supplement  237 mL Oral BID BM  . furosemide  80 mg Intravenous Once  . gatorade (BH)  240 mL Oral QID  . pantoprazole  40 mg Oral QHS  . piperacillin-tazobactam (ZOSYN)  IV  2.25 g Intravenous Q8H  . sodium chloride  10-40 mL Intracatheter Q12H  . DISCONTD: sodium hypochlorite   Irrigation BID   acetaminophen (TYLENOL) oral liquid 160 mg/5 mL, albuterol-ipratropium, fentaNYL, hydrALAZINE, LORazepam, magic mouthwash, ondansetron, oxyCODONE-acetaminophen, sodium chloride  Exam: Gen- alert, no distress CVS- RRR RS- CTA bilat ABD- BS present soft non-distended, LLQ drain x 2, LLQ colostomy EXT- no  edema x 4 ext Neuro- Ox3, no asterixis   Assessment/ Plan:  1. Acute renal failure- ATN due  to vanc toxicity +/- contrast injury.  Appears to be recovering, creat down today and UOP marked increase (albeit after on dose IV lasix last night).  Will decrease IVF to 50/hr and check creat in am. Should continue to improve from here. He is edematous, but that is slightly better today. 2. Perforated diverticulum, s/p colectomy, colostomy and drain placement-per surgery.  3. Past history urinary retention with Urethral stricture-see 1 above. Foley in place.  Jeffrey Moselle  MD [employer] [phone] pgr    [phone] cell 02/07/2012, 1:14 PM

## 2012-02-08 LAB — RENAL FUNCTION PANEL
CO2: 17 mEq/L — ABNORMAL LOW (ref 19–32)
Calcium: 7.6 mg/dL — ABNORMAL LOW (ref 8.4–10.5)
Chloride: 102 mEq/L (ref 96–112)
GFR calc Af Amer: 11 mL/min — ABNORMAL LOW (ref >90)
GFR calc non Af Amer: 9 mL/min — ABNORMAL LOW (ref >90)
Glucose, Bld: 156 mg/dL — ABNORMAL HIGH (ref 70–99)
Potassium: 4.2 mEq/L (ref 3.5–5.1)
Sodium: 132 mEq/L — ABNORMAL LOW (ref 135–145)

## 2012-02-08 MED ORDER — SODIUM BICARBONATE 650 MG PO TABS
1300.0000 mg | ORAL_TABLET | Freq: Two times a day (BID) | ORAL | Status: DC
  Administered 2012-02-08 – 2012-02-13 (×11): 1300 mg via ORAL
  Filled 2012-02-08 (×13): qty 2

## 2012-02-08 NOTE — Progress Notes (Signed)
Nutrition Follow-up  Intervention: Meal and supplement intake excellent. Nutrition signing off.   Diet Order: Regular  - Pt currently asleep, however nursing reports pt has been eating better. Nursing reports pt had 100% of 1 Ensure Complete this morning and ate 85% of breakfast.   Meds: Scheduled Meds:   . enoxaparin  30 mg Subcutaneous Q24H  . feeding supplement  237 mL Oral BID BM  . gatorade (BH)  240 mL Oral QID  . pantoprazole  40 mg Oral QHS  . piperacillin-tazobactam (ZOSYN)  IV  2.25 g Intravenous Q8H  . sodium bicarbonate  1,300 mg Oral BID  . sodium chloride  10-40 mL Intracatheter Q12H   Continuous Infusions:   . sodium chloride 50 mL/hr at 02/07/12 1449   PRN Meds:.acetaminophen (TYLENOL) oral liquid 160 mg/5 mL, albuterol-ipratropium, fentaNYL, hydrALAZINE, LORazepam, magic mouthwash, ondansetron, oxyCODONE-acetaminophen, sodium chloride  Labs:  CMP     Component Value Date/Time   NA 132* 02/08/2012 0620   K 4.2 02/08/2012 0620   CL 102 02/08/2012 0620   CO2 17* 02/08/2012 0620   GLUCOSE 156* 02/08/2012 0620   BUN 46* 02/08/2012 0620   CREATININE 6.80* 02/08/2012 0620   CALCIUM 7.6* 02/08/2012 0620   PROT 6.0 01/30/2012 0629   ALBUMIN 1.6* 02/08/2012 0620   AST 49* 01/30/2012 0629   ALT 89* 01/30/2012 0629   ALKPHOS 70 01/30/2012 0629   BILITOT 0.2* 01/30/2012 0629   GFRNONAA 9* 02/08/2012 0620   GFRAA 11* 02/08/2012 0620     Intake/Output Summary (Last 24 hours) at 02/08/12 1336 Last data filed at 02/08/12 1000  Gross per 24 hour  Intake 2572.92 ml  Output   5752 ml  Net -3179.08 ml   Last BM - 10/21  Weight Status:   10/16 276 lb 10.8 oz 10/23 296 lb 1.2 oz  Estimated needs:   1900-2250 calories 90-115g protein  Nutrition Dx:  Inadequate oral intake - resolved.   Goal: Pt to consume >75% of meals/supplements - met  Monitor: Intake   Levon Hedger MS, RD, LDN [phone] Pager [phone] After Hours Pager

## 2012-02-08 NOTE — Progress Notes (Signed)
Subjective: Pt feeling ok. Eating reg diet  Objective: Physical Exam: BP 144/80  Pulse 102  Temp 99 F (37.2 C) (Oral)  Resp 20  Ht 5\' 10"  (1.778 m)  Wt 296 lb 1.2 oz (134.3 kg)  BMI 42.48 kg/m2  SpO2 97% Drains intact, site clean Noted from RN when flushing upper drain, getting some leakage return around drains Output has been declining, though still milky purulent from lower drain.   Labs: CBC No results found for this basename: WBC:2,HGB:2,HCT:2,PLT:2 in the last 72 hours BMET  [facility] 02/08/12 0620 02/07/12 0630  NA 132* 137  K 4.2 4.6  CL 102 106  CO2 17* 17*  GLUCOSE 156* 116*  BUN 46* 46*  CREATININE 6.80* 7.04*  CALCIUM 7.6* 7.6*   LFT  Basename 02/08/12 0620  PROT --  ALBUMIN 1.6*  AST --  ALT --  ALKPHOS --  BILITOT --  BILIDIR --  IBILI --  LIPASE --   PT/INR No results found for this basename: LABPROT:2,INR:2 in the last 72 hours   Studies/Results: No results found.  Assessment/Plan: s/p left abd abscess drainages 10/8, 10/18; cont drain flushes of lower drain only. Renal fxn trending in right direction Repeat CT for tomorrow to reassess.    LOS: 23 days    Brayton El PA-C 02/08/2012 9:09 AM

## 2012-02-08 NOTE — Progress Notes (Signed)
Wife changed dressing with minimal assistance. Also flushed drain #2.  Patient emptied colostomy on side of bed with minimal assistance as well.

## 2012-02-08 NOTE — Progress Notes (Signed)
Patient ID: NAPHTALI MEHNER, male   DOB: Aug 16, 1970, 41 y.o.   MRN: [id number] 22 Days Post-Op  Subjective: Pt feeling well today.  Eating well, large BM last night after large meal.  No complaints   Objective: Vital signs in last 24 hours: Temp:  [99 F (37.2 C)-100.2 F (37.9 C)] 99 F (37.2 C) (10/23 0557) Pulse Rate:  [102-109] 102  (10/23 0557) Resp:  [16-20] 20  (10/23 0557) BP: (121-172)/(80-98) 144/80 mmHg (10/23 0557) SpO2:  [96 %-97 %] 97 % (10/23 0557) Weight:  [296 lb 1.2 oz (134.3 kg)] 296 lb 1.2 oz (134.3 kg) (10/23 0705) Last BM Date: 02/06/12  Intake/Output from previous day: 10/22 0701 - 10/23 0700 In: 2885.4 [P.O.:1280; I.V.:1425.4; IV Piggyback:150] Out: 5832 [Urine:4400; Drains:32; Stool:1400] Intake/Output this shift:    PE: Abd: stable, stoma stable with better surrounding granulation tissue.  Stoma is patent.  Wound is stable and packed.  Drains with decrease in output but still cloudy and milky  Lab Results:  No results found for this basename: WBC:2,HGB:2,HCT:2,PLT:2 in the last 72 hours BMET  [facility] 02/08/12 0620 02/07/12 0630  NA 132* 137  K 4.2 4.6  CL 102 106  CO2 17* 17*  GLUCOSE 156* 116*  BUN 46* 46*  CREATININE 6.80* 7.04*  CALCIUM 7.6* 7.6*   PT/INR No results found for this basename: LABPROT:2,INR:2 in the last 72 hours CMP     Component Value Date/Time   NA 132* 02/08/2012 0620   K 4.2 02/08/2012 0620   CL 102 02/08/2012 0620   CO2 17* 02/08/2012 0620   GLUCOSE 156* 02/08/2012 0620   BUN 46* 02/08/2012 0620   CREATININE 6.80* 02/08/2012 0620   CALCIUM 7.6* 02/08/2012 0620   PROT 6.0 01/30/2012 0629   ALBUMIN 1.6* 02/08/2012 0620   AST 49* 01/30/2012 0629   ALT 89* 01/30/2012 0629   ALKPHOS 70 01/30/2012 0629   BILITOT 0.2* 01/30/2012 0629   GFRNONAA 9* 02/08/2012 0620   GFRAA 11* 02/08/2012 0620   Lipase     Component Value Date/Time   LIPASE 24 01/16/2012 1210       Studies/Results: No results  found.  Anti-infectives: Anti-infectives     Start     Dose/Rate Route Frequency Ordered Stop   02/06/12 1400  piperacillin-tazobactam (ZOSYN) IVPB 2.25 g       2.25 g 100 mL/hr over 30 Minutes Intravenous 3 times per day 02/06/12 1043     02/03/12 1800   piperacillin-tazobactam (ZOSYN) IVPB 2.25 g  Status:  Discontinued        2.25 g 100 mL/hr over 30 Minutes Intravenous 4 times per day 02/03/12 0924 02/06/12 1043   02/01/12 2300   vancomycin (VANCOCIN) 1,500 mg in sodium chloride 0.9 % 500 mL IVPB  Status:  Discontinued        1,500 mg 250 mL/hr over 120 Minutes Intravenous Every 12 hours 02/01/12 1355 02/02/12 0924   02/01/12 0800   piperacillin-tazobactam (ZOSYN) IVPB 3.375 g  Status:  Discontinued        3.375 g 12.5 mL/hr over 240 Minutes Intravenous Every 8 hours 02/01/12 0724 02/03/12 0924   01/31/12 1000   ciprofloxacin (CIPRO) tablet 500 mg  Status:  Discontinued        500 mg Oral 2 times daily 01/31/12 0950 02/01/12 0724   01/28/12 2300   vancomycin (VANCOCIN) 2,000 mg in sodium chloride 0.9 % 500 mL IVPB  Status:  Discontinued  2,000 mg 250 mL/hr over 120 Minutes Intravenous Every 12 hours 01/27/12 2239 01/27/12 2243   01/28/12 1200   vancomycin (VANCOCIN) 2,000 mg in sodium chloride 0.9 % 500 mL IVPB  Status:  Discontinued        2,000 mg 250 mL/hr over 120 Minutes Intravenous Every 12 hours 01/27/12 2233 01/27/12 2238   01/27/12 2300   vancomycin (VANCOCIN) 750 mg in sodium chloride 0.9 % 150 mL IVPB  Status:  Discontinued        750 mg 150 mL/hr over 60 Minutes Intravenous  Once 01/27/12 2232 01/27/12 2233   01/27/12 2300   vancomycin (VANCOCIN) 2,000 mg in sodium chloride 0.9 % 500 mL IVPB  Status:  Discontinued        2,000 mg 250 mL/hr over 120 Minutes Intravenous Every 12 hours 01/27/12 2244 02/01/12 1355   01/25/12 1045   vancomycin (VANCOCIN) 1,250 mg in sodium chloride 0.9 % 250 mL IVPB  Status:  Discontinued        1,250 mg 166.7 mL/hr over 90  Minutes Intravenous 2 times daily 01/25/12 1020 01/27/12 2237   01/23/12 1300   micafungin (MYCAMINE) 100 mg in sodium chloride 0.9 % 100 mL IVPB        100 mg 100 mL/hr over 1 Hours Intravenous Daily 01/23/12 1158 01/30/12 1226   01/16/12 1800  piperacillin-tazobactam (ZOSYN) IVPB 3.375 g       3.375 g 12.5 mL/hr over 240 Minutes Intravenous 3 times per day 01/16/12 1721 01/27/12 1012           Assessment/Plan 1. S/p Hartman's 2. Possible rectal stump leak with abdominal abscess 3. ARF, Cr slightly down today, UOP stable 4. Wound dehiscence but no evisceration  Plan: 1. Cont current care.  Still on zosyn, at some point need to consider switching to oral abx therapy 2. Repeat CT scan on Thursday with ORAL contrast ONLY! 3. Cont drains 4. Continue to follow creatinine.    LOS: 23 days    WHITE, [name] 02/08/2012, 9:55 AM

## 2012-02-08 NOTE — Progress Notes (Signed)
Physical Therapy Treatment Patient Details Name: [name] MRN: [id number] DOB: 06-21-1970 Today's Date: 02/08/2012 Time: 4098-1191 PT Time Calculation (min): 27 min  PT Assessment / Plan / Recommendation Comments on Treatment Session  2nd walk for the day. Pt not particularly pleased about it though. Tolerance slowly improving. HHPT    Follow Up Recommendations  Home health PT;Supervision/Assistance - 24 hour     Does the patient have the potential to tolerate intense rehabilitation     Barriers to Discharge        Equipment Recommendations  Rolling walker with 5" wheels;3 in 1 bedside comode    Recommendations for Other Services    Frequency Min 3X/week   Plan Discharge plan remains appropriate    Precautions / Restrictions Precautions Precautions: Fall Precaution Comments: ABD binder, colostomy, drains (2) L flank Restrictions Weight Bearing Restrictions: No   Pertinent Vitals/Pain Abdomen-unrated. RN notified.     Mobility  Bed Mobility Bed Mobility: Supine to Sit;Sit to Supine Supine to Sit: 3: Mod assist;HOB elevated;With rails Sit to Supine: HOB elevated;With rail;4: Min guard Details for Bed Mobility Assistance: Continues to require assist for trunk to upright. (plans to sleep in recliner at home) Transfers Transfers: Sit to Stand;Stand to Sit Sit to Stand: 4: Min guard;From elevated surface;From bed;With upper extremity assist Stand to Sit: Min guard;To bed;To elevated surface;With upper extremity assist Details for Transfer Assistance: Pt prefers to pull up on RW.  Ambulation/Gait Ambulation/Gait Assistance: 4: Min guard Ambulation Distance (Feet): 500 Feet Assistive device: Rolling walker Ambulation/Gait Assistance Details: Fatigued towards end of walk. Dyspnea 2/4.  Gait Pattern: Step-through pattern    Exercises     PT Diagnosis:    PT Problem List:   PT Treatment Interventions:     PT Goals Acute Rehab PT Goals Pt will go Supine/Side  to Sit: with supervision PT Goal: Supine/Side to Sit - Progress: Progressing toward goal Pt will go Sit to Supine/Side: with supervision PT Goal: Sit to Supine/Side - Progress: Progressing toward goal Pt will go Sit to Stand: with supervision PT Goal: Sit to Stand - Progress: Progressing toward goal Pt will go Stand to Sit: with supervision PT Goal: Stand to Sit - Progress: Progressing toward goal Pt will Ambulate: >150 feet;with supervision;with least restrictive assistive device PT Goal: Ambulate - Progress: Progressing toward goal  Visit Information  Last PT Received On: 02/08/12 Assistance Needed: +1    Subjective Data  Subjective: "Waking me up out of a dead sleep....that's what gets me throwing things" Patient Stated Goal: Less pain. Home   Cognition  Overall Cognitive Status: Appears within functional limits for tasks assessed/performed Arousal/Alertness: Awake/[name] Orientation Level: Appears intact for tasks assessed Behavior During Session: [facility] for tasks performed    Balance     End of Session PT - End of Session Activity Tolerance: Patient limited by fatigue Patient left: in bed;with call bell/phone within reach;with family/visitor present   GP     [name] [facility] 02/08/2012, 4:02 PM [phone]

## 2012-02-08 NOTE — Progress Notes (Signed)
[facility] ROUNDING NOTE   Subjective:   Interval History: resting with no complaints, eating well. Good UOP around 4 L yest. Creat down 6.8 today  Objective:  Vital signs in last 24 hours:  Temp:  [99 F (37.2 C)-100.2 F (37.9 C)] 99 F (37.2 C) (10/23 0557) Pulse Rate:  [102-109] 102  (10/23 0557) Resp:  [16-20] 20  (10/23 0557) BP: (121-172)/(80-98) 144/80 mmHg (10/23 0557) SpO2:  [96 %-97 %] 97 % (10/23 0557) Weight:  [134.3 kg (296 lb 1.2 oz)] 134.3 kg (296 lb 1.2 oz) (10/23 0705)  Weight change:  Filed Weights   02/04/12 0500 02/06/12 0500 02/08/12 0705  Weight: 130 kg (286 lb 9.6 oz) 133.3 kg (293 lb 14 oz) 134.3 kg (296 lb 1.2 oz)    Intake/Output: I/O last 3 completed shifts: In: 4561.7 [P.O.:2360; I.V.:2021.7; Other:30; IV Piggyback:150] Out: 16109 [Urine:8800; Drains:82; Stool:1580]  Basic Metabolic Panel:  Lab 02/08/12 6045 02/07/12 0630 02/06/12 0350 02/05/12 0438 02/04/12 0500 02/03/12 0605  NA 132* 137 135 133* 128* --  K 4.2 4.6 4.9 4.6 4.5 --  CL 102 106 105 101 97 --  CO2 17* 17* 16* 16* 18* --  GLUCOSE 156* 116* 90 89 80 --  BUN 46* 46* 44* 40* 33* --  CREATININE 6.80* 7.04* 7.12* 6.65* 5.74* --  CALCIUM 7.6* 7.6* 7.6* -- -- --  MG -- -- -- -- -- 1.9  PHOS 5.8* 6.6* 6.7* 5.9* 6.1* --    Liver Function Tests:  Lab 02/08/12 0620 02/07/12 0630 02/06/12 0350 02/05/12 0438 02/04/12 0500  AST -- -- -- -- --  ALT -- -- -- -- --  ALKPHOS -- -- -- -- --  BILITOT -- -- -- -- --  PROT -- -- -- -- --  ALBUMIN 1.6* 1.7* 1.6* 1.6* 1.6*   No results found for this basename: LIPASE:5,AMYLASE:5 in the last 168 hours No results found for this basename: AMMONIA:3 in the last 168 hours  CBC:  Lab 02/04/12 0606  WBC 6.1  NEUTROABS --  HGB 9.7*  HCT 29.6*  MCV 90.8  PLT 370    Cardiac Enzymes: No results found for this basename: CKTOTAL:5,CKMB:5,CKMBINDEX:5,TROPONINI:5 in the last 168 hours  BNP: No components found with this  basename: POCBNP:5  CBG: No results found for this basename: GLUCAP:5 in the last 168 hours  Microbiology: Results for orders placed during the hospital encounter of 01/16/12  SURGICAL PCR SCREEN     Status: Normal   Collection Time   01/17/12  8:28 AM      Component Value Range Status Comment   MRSA, PCR NEGATIVE  NEGATIVE Final    Staphylococcus aureus NEGATIVE  NEGATIVE Final   CULTURE, BLOOD (ROUTINE X 2)     Status: Normal   Collection Time   01/19/12 10:46 AM      Component Value Range Status Comment   Specimen Description BLOOD LEFT ARM   Final    Special Requests BOTTLES DRAWN AEROBIC AND ANAEROBIC 10CC   Final    Culture  Setup Time 01/19/2012 16:55   Final    Culture NO GROWTH 5 DAYS   Final    Report Status 01/25/2012 FINAL   Final   CULTURE, BLOOD (ROUTINE X 2)     Status: Normal   Collection Time   01/19/12 10:48 AM      Component Value Range Status Comment   Specimen Description BLOOD LEFT ARM   Final    Special Requests BOTTLES DRAWN  AEROBIC AND ANAEROBIC 10CC   Final    Culture  Setup Time 01/19/2012 16:55   Final    Culture NO GROWTH 5 DAYS   Final    Report Status 01/25/2012 FINAL   Final   CULTURE, ROUTINE-ABSCESS     Status: Normal   Collection Time   01/24/12  1:10 PM      Component Value Range Status Comment   Specimen Description PERITONEAL CAVITY   Final    Special Requests NONE   Final    Gram Stain     Final    Value: FEW WBC PRESENT, PREDOMINANTLY PMN     NO SQUAMOUS EPITHELIAL CELLS SEEN     ABUNDANT GRAM NEGATIVE RODS     ABUNDANT GRAM POSITIVE RODS     FEW GRAM VARIABLE ROD   Culture ABUNDANT ENTEROCOCCUS SPECIES   Final    Report Status 01/27/2012 FINAL   Final    Organism ID, Bacteria ENTEROCOCCUS SPECIES   Final   CULTURE, ROUTINE-ABSCESS     Status: Normal   Collection Time   01/24/12  2:07 PM      Component Value Range Status Comment   Specimen Description PERITONEAL CAVITY   Final    Special Requests NONE   Final    Gram Stain      Final    Value: RARE WBC PRESENT, PREDOMINANTLY PMN     NO SQUAMOUS EPITHELIAL CELLS SEEN     ABUNDANT GRAM POSITIVE RODS     MODERATE GRAM NEGATIVE RODS     MODERATE GRAM VARIABLE ROD   Culture ABUNDANT ENTEROCOCCUS SPECIES   Final    Report Status 01/27/2012 FINAL   Final    Organism ID, Bacteria ENTEROCOCCUS SPECIES   Final   BODY FLUID CULTURE     Status: Normal   Collection Time   01/31/12  5:41 PM      Component Value Range Status Comment   Specimen Description ABSCESS   Final    Special Requests Normal   Final    Gram Stain     Final    Value: ABUNDANT WBC PRESENT,BOTH PMN AND MONONUCLEAR     ABUNDANT GRAM NEGATIVE RODS     Gram Stain Report Called to,Read Back By and Verified With: Gram Stain Report Called to,Read Back By and Verified With: CLARISSA STATEN RN 02/01/12 8:15AM BY MILSH   Culture FEW KLEBSIELLA PNEUMONIAE   Final    Report Status 02/03/2012 FINAL   Final    Organism ID, Bacteria KLEBSIELLA PNEUMONIAE   Final     Coagulation Studies: No results found for this basename: LABPROT:5,INR:5 in the last 72 hours  Urinalysis: No results found for this basename: COLORURINE:2,APPERANCEUR:2,LABSPEC:2,PHURINE:2,GLUCOSEU:2,HGBUR:2,BILIRUBINUR:2,KETONESUR:2,PROTEINUR:2,UROBILINOGEN:2,NITRITE:2,LEUKOCYTESUR:2 in the last 72 hours    Imaging: No results found.   Medications:      . sodium chloride 50 mL/hr at 02/07/12 1449      . enoxaparin  30 mg Subcutaneous Q24H  . feeding supplement  237 mL Oral BID BM  . gatorade (BH)  240 mL Oral QID  . pantoprazole  40 mg Oral QHS  . piperacillin-tazobactam (ZOSYN)  IV  2.25 g Intravenous Q8H  . sodium chloride  10-40 mL Intracatheter Q12H   acetaminophen (TYLENOL) oral liquid 160 mg/5 mL, albuterol-ipratropium, fentaNYL, hydrALAZINE, LORazepam, magic mouthwash, ondansetron, oxyCODONE-acetaminophen, sodium chloride  Exam Gen- alert, no distress CVS- RRR RS- CTA bilat ABD- BS present soft non-distended, LLQ drain x  2, LLQ colostomy EXT- 1+ pitting edema pretibial and  thigh edema bilat Neuro- Ox3, no asterixis   Assessment/ Plan:  1. Acute renal failure- ATN due to vanc toxicity +/- contrast injury.  Recovery phase, mild vol overload. Continue IVF at 40 cc/hr, no diuretic needed. Diuresing spontaneously.  2. Metabolic acidosis- will give po NaHCO3  3. Perforated diverticulum, s/p colectomy, colostomy and drain placement-per surgery.  4. Past history urinary retention with urethral stricture- foley cath in place.  Jeffrey Moselle  MD [facility] [phone] pgr    [phone] cell 02/08/2012, 11:45 AM

## 2012-02-09 ENCOUNTER — Inpatient Hospital Stay (HOSPITAL_COMMUNITY): Payer: Medicaid Other

## 2012-02-09 LAB — RENAL FUNCTION PANEL
Albumin: 1.7 g/dL — ABNORMAL LOW (ref 3.5–5.2)
CO2: 19 mEq/L (ref 19–32)
Chloride: 106 mEq/L (ref 96–112)
Creatinine, Ser: 6.4 mg/dL — ABNORMAL HIGH (ref 0.50–1.35)
GFR calc Af Amer: 11 mL/min — ABNORMAL LOW (ref >90)
GFR calc non Af Amer: 10 mL/min — ABNORMAL LOW (ref >90)
Sodium: 137 mEq/L (ref 135–145)

## 2012-02-09 NOTE — Progress Notes (Signed)
Caryn Bee, Pa for interventional radiology on floor to see patient informed per my report we were only supposed to flush lower drain, Caryn Bee states to flush both drains with 5ml NS

## 2012-02-09 NOTE — Progress Notes (Signed)
[facility] ROUNDING NOTE   Subjective:   Interval History: resting with no complaints, eating well. Creat down 6.4 today, UOP 3.6 liters yest.  Objective:  Vital signs in last 24 hours:  Temp:  [98 F (36.7 C)-100.3 F (37.9 C)] 100.3 F (37.9 C) (10/24 0600) Pulse Rate:  [82-109] 109  (10/24 0600) Resp:  [20] 20  (10/24 0600) BP: (156-157)/(91-96) 157/91 mmHg (10/24 0600) SpO2:  [93 %-97 %] 97 % (10/24 0600)  Weight change:  Filed Weights   02/04/12 0500 02/06/12 0500 02/08/12 0705  Weight: 130 kg (286 lb 9.6 oz) 133.3 kg (293 lb 14 oz) 134.3 kg (296 lb 1.2 oz)    Intake/Output: I/O last 3 completed shifts: In: 3280.5 [P.O.:1320; I.V.:1670.5; Other:40; IV Piggyback:250] Out: 7427 [Urine:6200; Drains:27; Stool:1200]  Basic Metabolic Panel:  Lab 02/09/12 9604 02/08/12 0620 02/07/12 0630 02/06/12 0350 02/05/12 0438 02/03/12 0605  NA 137 132* 137 135 133* --  K 4.1 4.2 4.6 4.9 4.6 --  CL 106 102 106 105 101 --  CO2 19 17* 17* 16* 16* --  GLUCOSE 100* 156* 116* 90 89 --  BUN 47* 46* 46* 44* 40* --  CREATININE 6.40* 6.80* 7.04* 7.12* 6.65* --  CALCIUM 7.8* 7.6* 7.6* -- -- --  MG -- -- -- -- -- 1.9  PHOS 5.7* 5.8* 6.6* 6.7* 5.9* --    Liver Function Tests:  Lab 02/09/12 0510 02/08/12 0620 02/07/12 0630 02/06/12 0350 02/05/12 0438  AST -- -- -- -- --  ALT -- -- -- -- --  ALKPHOS -- -- -- -- --  BILITOT -- -- -- -- --  PROT -- -- -- -- --  ALBUMIN 1.7* 1.6* 1.7* 1.6* 1.6*   No results found for this basename: LIPASE:5,AMYLASE:5 in the last 168 hours No results found for this basename: AMMONIA:3 in the last 168 hours  CBC:  Lab 02/04/12 0606  WBC 6.1  NEUTROABS --  HGB 9.7*  HCT 29.6*  MCV 90.8  PLT 370    Cardiac Enzymes: No results found for this basename: CKTOTAL:5,CKMB:5,CKMBINDEX:5,TROPONINI:5 in the last 168 hours  BNP: No components found with this basename: POCBNP:5  CBG: No results found for this basename: GLUCAP:5 in the last  168 hours  Microbiology: Results for orders placed during the hospital encounter of 01/16/12  SURGICAL PCR SCREEN     Status: Normal   Collection Time   01/17/12  8:28 AM      Component Value Range Status Comment   MRSA, PCR NEGATIVE  NEGATIVE Final    Staphylococcus aureus NEGATIVE  NEGATIVE Final   CULTURE, BLOOD (ROUTINE X 2)     Status: Normal   Collection Time   01/19/12 10:46 AM      Component Value Range Status Comment   Specimen Description BLOOD LEFT ARM   Final    Special Requests BOTTLES DRAWN AEROBIC AND ANAEROBIC 10CC   Final    Culture  Setup Time 01/19/2012 16:55   Final    Culture NO GROWTH 5 DAYS   Final    Report Status 01/25/2012 FINAL   Final   CULTURE, BLOOD (ROUTINE X 2)     Status: Normal   Collection Time   01/19/12 10:48 AM      Component Value Range Status Comment   Specimen Description BLOOD LEFT ARM   Final    Special Requests BOTTLES DRAWN AEROBIC AND ANAEROBIC 10CC   Final    Culture  Setup Time 01/19/2012 16:55  Final    Culture NO GROWTH 5 DAYS   Final    Report Status 01/25/2012 FINAL   Final   CULTURE, ROUTINE-ABSCESS     Status: Normal   Collection Time   01/24/12  1:10 PM      Component Value Range Status Comment   Specimen Description PERITONEAL CAVITY   Final    Special Requests NONE   Final    Gram Stain     Final    Value: FEW WBC PRESENT, PREDOMINANTLY PMN     NO SQUAMOUS EPITHELIAL CELLS SEEN     ABUNDANT GRAM NEGATIVE RODS     ABUNDANT GRAM POSITIVE RODS     FEW GRAM VARIABLE ROD   Culture ABUNDANT ENTEROCOCCUS SPECIES   Final    Report Status 01/27/2012 FINAL   Final    Organism ID, Bacteria ENTEROCOCCUS SPECIES   Final   CULTURE, ROUTINE-ABSCESS     Status: Normal   Collection Time   01/24/12  2:07 PM      Component Value Range Status Comment   Specimen Description PERITONEAL CAVITY   Final    Special Requests NONE   Final    Gram Stain     Final    Value: RARE WBC PRESENT, PREDOMINANTLY PMN     NO SQUAMOUS EPITHELIAL CELLS  SEEN     ABUNDANT GRAM POSITIVE RODS     MODERATE GRAM NEGATIVE RODS     MODERATE GRAM VARIABLE ROD   Culture ABUNDANT ENTEROCOCCUS SPECIES   Final    Report Status 01/27/2012 FINAL   Final    Organism ID, Bacteria ENTEROCOCCUS SPECIES   Final   BODY FLUID CULTURE     Status: Normal   Collection Time   01/31/12  5:41 PM      Component Value Range Status Comment   Specimen Description ABSCESS   Final    Special Requests Normal   Final    Gram Stain     Final    Value: ABUNDANT WBC PRESENT,BOTH PMN AND MONONUCLEAR     ABUNDANT GRAM NEGATIVE RODS     Gram Stain Report Called to,Read Back By and Verified With: Gram Stain Report Called to,Read Back By and Verified With: CLARISSA STATEN RN 02/01/12 8:15AM BY MILSH   Culture FEW KLEBSIELLA PNEUMONIAE   Final    Report Status 02/03/2012 FINAL   Final    Organism ID, Bacteria KLEBSIELLA PNEUMONIAE   Final     Coagulation Studies: No results found for this basename: LABPROT:5,INR:5 in the last 72 hours  Urinalysis: No results found for this basename: COLORURINE:2,APPERANCEUR:2,LABSPEC:2,PHURINE:2,GLUCOSEU:2,HGBUR:2,BILIRUBINUR:2,KETONESUR:2,PROTEINUR:2,UROBILINOGEN:2,NITRITE:2,LEUKOCYTESUR:2 in the last 72 hours    Imaging: No results found.   Medications:      . sodium chloride 40 mL/hr at 02/09/12 1212      . enoxaparin  30 mg Subcutaneous Q24H  . feeding supplement  237 mL Oral BID BM  . gatorade (BH)  240 mL Oral QID  . pantoprazole  40 mg Oral QHS  . piperacillin-tazobactam (ZOSYN)  IV  2.25 g Intravenous Q8H  . sodium bicarbonate  1,300 mg Oral BID  . sodium chloride  10-40 mL Intracatheter Q12H   acetaminophen (TYLENOL) oral liquid 160 mg/5 mL, albuterol-ipratropium, fentaNYL, hydrALAZINE, LORazepam, magic mouthwash, ondansetron, oxyCODONE-acetaminophen, sodium chloride  Exam Gen- alert, no distress CVS- RRR RS- CTA bilat ABD- BS present soft non-distended, LLQ drain x 2, LLQ colostomy EXT- 1+ pitting edema  pretibial and 1-2+ thigh edema bilat Neuro- Ox3, no asterixis  Assessment/ Plan:  1. Acute renal failure- ATN due to vanc toxicity +/- contrast injury.  Continue IVF at 40 cc/hr, no diuretic needed. Diuresing spontaneously. Creat down 6.4 today, recovery phase. 2. Metabolic acidosis- po NaHCO3  3. Perforated diverticulum, s/p colectomy, colostomy and drain placement-per surgery.  4. Past history urinary retention with urethral stricture- foley cath in place.  Vinson Moselle  MD [facility] [phone] pgr    [phone] cell 02/09/2012, 2:31 PM

## 2012-02-09 NOTE — Progress Notes (Signed)
ANTIBIOTIC CONSULT NOTE  Pharmacy Consult for Zosyn Indication: Diverticular Abscess  No Known Allergies  Patient Measurements: Height: 5\' 10"  (177.8 cm) Weight: 296 lb 1.2 oz (134.3 kg) IBW/kg (Calculated) : 73   Vital Signs: Temp: 100.3 F (37.9 C) (10/24 0600) Temp src: Oral (10/24 0600) BP: 157/91 mmHg (10/24 0600) Pulse Rate: 109  (10/24 0600) Intake/Output from previous day: 10/23 0701 - 10/24 0700 In: 1598 [P.O.:360; I.V.:1068; IV Piggyback:150] Out: 3650 [Urine:3650] Intake/Output from this shift: Total I/O In: 120 [P.O.:120] Out: 1000 [Urine:1000]  Labs:  Basename 02/09/12 0510 02/08/12 0620 02/07/12 0630  WBC -- -- --  HGB -- -- --  PLT -- -- --  LABCREA -- -- --  CREATININE 6.40* 6.80* 7.04*   Estimated Creatinine Clearance: 21.2 ml/min (by C-G formula based on Cr of 6.4).   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 CT on 10/7 with large fluid collection, percutaneous drain placed 10/8.  Patient transferred back to floor 10/13.  Acute renal failure developed while on vancomycin (now [facility]).  SCr improving slowly; CrCl now > 20 mL/min at 21 ml/min; nephrology following.  D9 Zosyn 2.25 grams IV q6h for diverticular abscess (culture from peritoneal drain grew Klebsiella).  Goal of Therapy:  Zosyn adjusted for renal function  Plan:  1. Continue current Zosyn for now  2. Follow SCr - if SCr continues to improve will adjust Zosyn dosing   Hessie Knows, PharmD,  BCPS Pager [phone] 02/09/2012 10:46 AM

## 2012-02-09 NOTE — Progress Notes (Signed)
Patient ID: [name], male   DOB: 10-Jul-1970, 41 y.o.   MRN: [id number] 23 Days Post-Op  Subjective: 41 y/o male doing well today, pain well controlled.  Pt is not ambulating much so reinforced getting OOB.  Pt eating well.  Pt denies N/V.  Pt helped to empty colostomy bag yesterday.  Wife is helping to change wounds in prep to go home.    Objective: Vital signs in last 24 hours: Temp:  [98 F (36.7 C)-100.3 F (37.9 C)] 100.3 F (37.9 C) (10/24 0600) Pulse Rate:  [82-109] 109  (10/24 0600) Resp:  [20] 20  (10/24 0600) BP: (134-157)/(81-96) 157/91 mmHg (10/24 0600) SpO2:  [93 %-98 %] 97 % (10/24 0600) Last BM Date: 02/06/12  Intake/Output from previous day: 10/23 0701 - 10/24 0700 In: 1598 [P.O.:360; I.V.:1068; IV Piggyback:150] Out: 3650 [Urine:3650] Intake/Output this shift: Total I/O In: -  Out: 700 [Urine:700]  PE: General:  Alert, NAD Card:  RRR, no murmurs heard Pulm:  CTA Abd: soft, stoma stable with better surrounding granulation tissue. Stoma is patent. Wound is stable and packed. Drains with cloudy and milky drainage.   Ext:  No erythema, edema, or tenderness   Lab Results:  No results found for this basename: WBC:2,HGB:2,HCT:2,PLT:2 in the last 72 hours BMET  [facility] 02/09/12 0510 02/08/12 0620  NA 137 132*  K 4.1 4.2  CL 106 102  CO2 19 17*  GLUCOSE 100* 156*  BUN 47* 46*  CREATININE 6.40* 6.80*  CALCIUM 7.8* 7.6*   PT/INR No results found for this basename: LABPROT:2,INR:2 in the last 72 hours CMP     Component Value Date/Time   NA 137 02/09/2012 0510   K 4.1 02/09/2012 0510   CL 106 02/09/2012 0510   CO2 19 02/09/2012 0510   GLUCOSE 100* 02/09/2012 0510   BUN 47* 02/09/2012 0510   CREATININE 6.40* 02/09/2012 0510   CALCIUM 7.8* 02/09/2012 0510   PROT 6.0 01/30/2012 0629   ALBUMIN 1.7* 02/09/2012 0510   AST 49* 01/30/2012 0629   ALT 89* 01/30/2012 0629   ALKPHOS 70 01/30/2012 0629   BILITOT 0.2* 01/30/2012 0629   GFRNONAA 10*  02/09/2012 0510   GFRAA 11* 02/09/2012 0510   Lipase     Component Value Date/Time   LIPASE 24 01/16/2012 1210       Studies/Results: No results found.  Anti-infectives: Anti-infectives     Start     Dose/Rate Route Frequency Ordered Stop   02/06/12 1400  piperacillin-tazobactam (ZOSYN) IVPB 2.25 g       2.25 g 100 mL/hr over 30 Minutes Intravenous 3 times per day 02/06/12 1043     02/03/12 1800   piperacillin-tazobactam (ZOSYN) IVPB 2.25 g  Status:  Discontinued        2.25 g 100 mL/hr over 30 Minutes Intravenous 4 times per day 02/03/12 0924 02/06/12 1043   02/01/12 2300   vancomycin (VANCOCIN) 1,500 mg in sodium chloride 0.9 % 500 mL IVPB  Status:  Discontinued        1,500 mg 250 mL/hr over 120 Minutes Intravenous Every 12 hours 02/01/12 1355 02/02/12 0924   02/01/12 0800   piperacillin-tazobactam (ZOSYN) IVPB 3.375 g  Status:  Discontinued        3.375 g 12.5 mL/hr over 240 Minutes Intravenous Every 8 hours 02/01/12 0724 02/03/12 0924   01/31/12 1000   ciprofloxacin (CIPRO) tablet 500 mg  Status:  Discontinued        500 mg Oral 2  times daily 01/31/12 0950 02/01/12 0724   01/28/12 2300   vancomycin (VANCOCIN) 2,000 mg in sodium chloride 0.9 % 500 mL IVPB  Status:  Discontinued        2,000 mg 250 mL/hr over 120 Minutes Intravenous Every 12 hours 01/27/12 2239 01/27/12 2243   01/28/12 1200   vancomycin (VANCOCIN) 2,000 mg in sodium chloride 0.9 % 500 mL IVPB  Status:  Discontinued        2,000 mg 250 mL/hr over 120 Minutes Intravenous Every 12 hours 01/27/12 2233 01/27/12 2238   01/27/12 2300   vancomycin (VANCOCIN) 750 mg in sodium chloride 0.9 % 150 mL IVPB  Status:  Discontinued        750 mg 150 mL/hr over 60 Minutes Intravenous  Once 01/27/12 2232 01/27/12 2233   01/27/12 2300   vancomycin (VANCOCIN) 2,000 mg in sodium chloride 0.9 % 500 mL IVPB  Status:  Discontinued        2,000 mg 250 mL/hr over 120 Minutes Intravenous Every 12 hours 01/27/12 2244  02/01/12 1355   01/25/12 1045   vancomycin (VANCOCIN) 1,250 mg in sodium chloride 0.9 % 250 mL IVPB  Status:  Discontinued        1,250 mg 166.7 mL/hr over 90 Minutes Intravenous 2 times daily 01/25/12 1020 01/27/12 2237   01/23/12 1300   micafungin (MYCAMINE) 100 mg in sodium chloride 0.9 % 100 mL IVPB        100 mg 100 mL/hr over 1 Hours Intravenous Daily 01/23/12 1158 01/30/12 1226   01/16/12 1800  piperacillin-tazobactam (ZOSYN) IVPB 3.375 g       3.375 g 12.5 mL/hr over 240 Minutes Intravenous 3 times per day 01/16/12 1721 01/27/12 1012           Assessment/Plan S/p Hartman's  1. Possible rectal stump leak with abdominal abscess  2. ARF, Cr down to 6.40 today, UOP stable  3. Wound dehiscence, but no evisceration  4. Confusion (as noted by the family) likely due to ARF  Plan:  1. Cont current care. Still on zosyn, at some point need to consider switching to oral abx therapy  2. CT Scan today with oral contrast only! 3. Cont drains  4. Continue to follow creatinine 5.  Increase ambulation! 6.  Cont Incentive Spiro    LOS: 24 days    DORT, Aundra Millet 02/09/2012, 9:50 AM Pager: [phone]

## 2012-02-09 NOTE — Progress Notes (Signed)
OT Cancellation Note  Patient Details Name: [name] MRN: [id number] DOB: 1970-12-29   Cancelled Treatment:    Reason Eval/Treat Not Completed: Patient at procedure or test/ unavailable  If time permits, will try later today; if not, tomorrow.    [address], OTR/L [phone] 02/09/2012 02/09/2012, 2:12 PM

## 2012-02-09 NOTE — Progress Notes (Signed)
23 Days Post-Op  Subjective: Pt without new c/o  Objective: Vital signs in last 24 hours: Temp:  [98 F (36.7 C)-100.3 F (37.9 C)] 98.6 F (37 C) (10/24 1400) Pulse Rate:  [82-109] 94  (10/24 1400) Resp:  [20] 20  (10/24 1400) BP: (156-157)/(91-96) 157/91 mmHg (10/24 1400) SpO2:  [93 %-99 %] 99 % (10/24 1400) Last BM Date: 02/06/12  Intake/Output from previous day: 10/23 0701 - 10/24 0700 In: 1598 [P.O.:360; I.V.:1068; IV Piggyback:150] Out: 3650 [Urine:3650] Intake/Output this shift: Total I/O In: 715.3 [P.O.:360; I.V.:345.3; Other:10] Out: 1840 [Urine:1800; Drains:40]  Left abd drains intact; outputs range from 10-40 cc's beige to brown fluid  Lab Results:  No results found for this basename: WBC:2,HGB:2,HCT:2,PLT:2 in the last 72 hours BMET  [facility] 02/09/12 0510 02/08/12 0620  NA 137 132*  K 4.1 4.2  CL 106 102  CO2 19 17*  GLUCOSE 100* 156*  BUN 47* 46*  CREATININE 6.40* 6.80*  CALCIUM 7.8* 7.6*   PT/INR No results found for this basename: LABPROT:2,INR:2 in the last 72 hours ABG No results found for this basename: PHART:2,PCO2:2,PO2:2,HCO3:2 in the last 72 hours  Studies/Results: Ct Abdomen Wo Contrast  02/09/2012  *RADIOLOGY REPORT*  Clinical Data:  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  Status post placement of a drainage catheter for abscess.  CT ABDOMEN WITHOUT CONTRAST  Technique:  Multidetector CT imaging of the abdomen was performed following the standard protocol without IV contrast.  Comparison:  CT abdomen and pelvis 02/02/2012 and 01/27/2012.  Findings:  Small left pleural effusions seen on the most recent CT scan has increased in size.  Associated compressive atelectatic change is noted.  There is some linear atelectasis in the right lung base.  No pericardial effusion on the right pleural effusion is present.  Pigtail drainage catheter remains in place in the left upper quadrant of the abdomen localizing air fluid  collection.  The collection is markedly decreased in size.  The collection had measured approximately 8.4 cm A P by 5.7 cm transverse by 16.1 cm cranial-caudal.  Today, the collection measures 5.7 cm AP by 3.7 cm transverse by 13.5 cm cranial-caudal.  Two drainage catheters localize the collection.  The second fluid collection in the upper left pelvis had measured 6.82 x 2.9 cm in the axial plane and today measures 5.7 x 1.4 cm in the axial plane on image 66.  No new or enlarging fluid collection is identified  Midline surgical wound is again identified.  Fatty infiltration of the liver without focal lesion is seen.  The gallbladder, adrenal glands, pancreas and kidneys are unremarkable.  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  IMPRESSION:  1.  Decrease in the size of a left side fluid collection consistent with abscess.  Two drainage catheters localize the remaining collection. 2.  Interval decrease in the size of a second, anterior left lower quadrant fluid collection as described above. 3.  Fatty infiltration of the liver.   Original Report Authenticated By: Bernadene Bell. Maricela Curet, M.D.     Anti-infectives: Anti-infectives     Start     Dose/Rate Route Frequency Ordered Stop   02/06/12 1400   piperacillin-tazobactam (ZOSYN) IVPB 2.25 g        2.25 g 100 mL/hr over 30 Minutes Intravenous 3 times per day 02/06/12 1043     02/03/12 1800   piperacillin-tazobactam (ZOSYN) IVPB 2.25 g  Status:  Discontinued        2.25 g 100 mL/hr over  30 Minutes Intravenous 4 times per day 02/03/12 0924 02/06/12 1043   02/01/12 2300   vancomycin (VANCOCIN) 1,500 mg in sodium chloride 0.9 % 500 mL IVPB  Status:  Discontinued        1,500 mg 250 mL/hr over 120 Minutes Intravenous Every 12 hours 02/01/12 1355 02/02/12 0924   02/01/12 0800   piperacillin-tazobactam (ZOSYN) IVPB 3.375 g  Status:  Discontinued        3.375 g 12.5 mL/hr over 240 Minutes Intravenous Every 8 hours 02/01/12 0724 02/03/12 0924   01/31/12  1000   ciprofloxacin (CIPRO) tablet 500 mg  Status:  Discontinued        500 mg Oral 2 times daily 01/31/12 0950 02/01/12 0724   01/28/12 2300   vancomycin (VANCOCIN) 2,000 mg in sodium chloride 0.9 % 500 mL IVPB  Status:  Discontinued        2,000 mg 250 mL/hr over 120 Minutes Intravenous Every 12 hours 01/27/12 2239 01/27/12 2243   01/28/12 1200   vancomycin (VANCOCIN) 2,000 mg in sodium chloride 0.9 % 500 mL IVPB  Status:  Discontinued        2,000 mg 250 mL/hr over 120 Minutes Intravenous Every 12 hours 01/27/12 2233 01/27/12 2238   01/27/12 2300   vancomycin (VANCOCIN) 750 mg in sodium chloride 0.9 % 150 mL IVPB  Status:  Discontinued        750 mg 150 mL/hr over 60 Minutes Intravenous  Once 01/27/12 2232 01/27/12 2233   01/27/12 2300   vancomycin (VANCOCIN) 2,000 mg in sodium chloride 0.9 % 500 mL IVPB  Status:  Discontinued        2,000 mg 250 mL/hr over 120 Minutes Intravenous Every 12 hours 01/27/12 2244 02/01/12 1355   01/25/12 1045   vancomycin (VANCOCIN) 1,250 mg in sodium chloride 0.9 % 250 mL IVPB  Status:  Discontinued        1,250 mg 166.7 mL/hr over 90 Minutes Intravenous 2 times daily 01/25/12 1020 01/27/12 2237   01/23/12 1300   micafungin (MYCAMINE) 100 mg in sodium chloride 0.9 % 100 mL IVPB        100 mg 100 mL/hr over 1 Hours Intravenous Daily 01/23/12 1158 01/30/12 1226   01/16/12 1800   piperacillin-tazobactam (ZOSYN) IVPB 3.375 g        3.375 g 12.5 mL/hr over 240 Minutes Intravenous 3 times per day 01/16/12 1721 01/27/12 1012          Assessment/Plan: s/p left abd abscess drainages 10/8, 10/18; f/u CT today shows sig decrease in size of abd fluid collections. Cont current tx. Will need drain injections before removal.   LOS: 24 days    ALLRED,D [facility] 02/09/2012

## 2012-02-10 LAB — RENAL FUNCTION PANEL
Albumin: 1.7 g/dL — ABNORMAL LOW (ref 3.5–5.2)
BUN: 43 mg/dL — ABNORMAL HIGH (ref 6–23)
CO2: 19 mEq/L (ref 19–32)
Calcium: 7.7 mg/dL — ABNORMAL LOW (ref 8.4–10.5)
Chloride: 101 mEq/L (ref 96–112)
Creatinine, Ser: 5.75 mg/dL — ABNORMAL HIGH (ref 0.50–1.35)
GFR calc Af Amer: 13 mL/min — ABNORMAL LOW (ref >90)
GFR calc non Af Amer: 11 mL/min — ABNORMAL LOW (ref >90)
Glucose, Bld: 111 mg/dL — ABNORMAL HIGH (ref 70–99)
Phosphorus: 5.3 mg/dL — ABNORMAL HIGH (ref 2.3–4.6)
Potassium: 3.9 mEq/L (ref 3.5–5.1)
Sodium: 133 mEq/L — ABNORMAL LOW (ref 135–145)

## 2012-02-10 MED ORDER — CIPROFLOXACIN HCL 500 MG PO TABS
500.0000 mg | ORAL_TABLET | Freq: Two times a day (BID) | ORAL | Status: DC
  Administered 2012-02-10 – 2012-02-13 (×7): 500 mg via ORAL
  Filled 2012-02-10 (×9): qty 1

## 2012-02-10 MED ORDER — METRONIDAZOLE 250 MG PO TABS
250.0000 mg | ORAL_TABLET | Freq: Two times a day (BID) | ORAL | Status: DC
  Administered 2012-02-10 – 2012-02-13 (×7): 250 mg via ORAL
  Filled 2012-02-10 (×8): qty 1

## 2012-02-10 NOTE — Progress Notes (Signed)
OT Cancellation Note  Patient Details Name: [name] MRN: [id number] DOB: October 01, 1970   Cancelled Treatment:    Reason Eval/Treat Not Completed: Other (comment) (pt refused)  Divon Krabill A, OTR/L [id number] 02/10/2012, 3:38 PM

## 2012-02-10 NOTE — Progress Notes (Signed)
[facility] ROUNDING NOTE   Subjective:   Interval History: resting with no complaints, eating well. Creat down 5.7 today, UOP 3.6 liters yest.  Objective:  Vital signs in last 24 hours:  Temp:  [98.2 F (36.8 C)-99.1 F (37.3 C)] 98.6 F (37 C) (10/25 1046) Pulse Rate:  [94-99] 97  (10/25 1046) Resp:  [18-20] 20  (10/25 1046) BP: (140-161)/(82-99) 140/82 mmHg (10/25 1046) SpO2:  [96 %-99 %] 96 % (10/25 0545)  Weight change:  Filed Weights   02/04/12 0500 02/06/12 0500 02/08/12 0705  Weight: 130 kg (286 lb 9.6 oz) 133.3 kg (293 lb 14 oz) 134.3 kg (296 lb 1.2 oz)    Intake/Output: I/O last 3 completed shifts: In: 2257.3 [P.O.:600; I.V.:1467.3; Other:40; IV Piggyback:150] Out: 6625 [Urine:5850; Drains:75; Stool:700]  Basic Metabolic Panel:  Lab 02/10/12 1610 02/09/12 0510 02/08/12 0620 02/07/12 0630 02/06/12 0350  NA 133* 137 132* 137 135  K 3.9 4.1 4.2 4.6 4.9  CL 101 106 102 106 105  CO2 19 19 17* 17* 16*  GLUCOSE 111* 100* 156* 116* 90  BUN 43* 47* 46* 46* 44*  CREATININE 5.75* 6.40* 6.80* 7.04* 7.12*  CALCIUM 7.7* 7.8* 7.6* -- --  MG -- -- -- -- --  PHOS 5.3* 5.7* 5.8* 6.6* 6.7*    Liver Function Tests:  Lab 02/10/12 0616 02/09/12 0510 02/08/12 0620 02/07/12 0630 02/06/12 0350  AST -- -- -- -- --  ALT -- -- -- -- --  ALKPHOS -- -- -- -- --  BILITOT -- -- -- -- --  PROT -- -- -- -- --  ALBUMIN 1.7* 1.7* 1.6* 1.7* 1.6*   No results found for this basename: LIPASE:5,AMYLASE:5 in the last 168 hours No results found for this basename: AMMONIA:3 in the last 168 hours  CBC:  Lab 02/04/12 0606  WBC 6.1  NEUTROABS --  HGB 9.7*  HCT 29.6*  MCV 90.8  PLT 370    Cardiac Enzymes: No results found for this basename: CKTOTAL:5,CKMB:5,CKMBINDEX:5,TROPONINI:5 in the last 168 hours  BNP: No components found with this basename: POCBNP:5  CBG: No results found for this basename: GLUCAP:5 in the last 168 hours  Microbiology: Results for orders  placed during the hospital encounter of 01/16/12  SURGICAL PCR SCREEN     Status: Normal   Collection Time   01/17/12  8:28 AM      Component Value Range Status Comment   MRSA, PCR NEGATIVE  NEGATIVE Final    Staphylococcus aureus NEGATIVE  NEGATIVE Final   CULTURE, BLOOD (ROUTINE X 2)     Status: Normal   Collection Time   01/19/12 10:46 AM      Component Value Range Status Comment   Specimen Description BLOOD LEFT ARM   Final    Special Requests BOTTLES DRAWN AEROBIC AND ANAEROBIC 10CC   Final    Culture  Setup Time 01/19/2012 16:55   Final    Culture NO GROWTH 5 DAYS   Final    Report Status 01/25/2012 FINAL   Final   CULTURE, BLOOD (ROUTINE X 2)     Status: Normal   Collection Time   01/19/12 10:48 AM      Component Value Range Status Comment   Specimen Description BLOOD LEFT ARM   Final    Special Requests BOTTLES DRAWN AEROBIC AND ANAEROBIC 10CC   Final    Culture  Setup Time 01/19/2012 16:55   Final    Culture NO GROWTH 5 DAYS   Final  Report Status 01/25/2012 FINAL   Final   CULTURE, ROUTINE-ABSCESS     Status: Normal   Collection Time   01/24/12  1:10 PM      Component Value Range Status Comment   Specimen Description PERITONEAL CAVITY   Final    Special Requests NONE   Final    Gram Stain     Final    Value: FEW WBC PRESENT, PREDOMINANTLY PMN     NO SQUAMOUS EPITHELIAL CELLS SEEN     ABUNDANT GRAM NEGATIVE RODS     ABUNDANT GRAM POSITIVE RODS     FEW GRAM VARIABLE ROD   Culture ABUNDANT ENTEROCOCCUS SPECIES   Final    Report Status 01/27/2012 FINAL   Final    Organism ID, Bacteria ENTEROCOCCUS SPECIES   Final   CULTURE, ROUTINE-ABSCESS     Status: Normal   Collection Time   01/24/12  2:07 PM      Component Value Range Status Comment   Specimen Description PERITONEAL CAVITY   Final    Special Requests NONE   Final    Gram Stain     Final    Value: RARE WBC PRESENT, PREDOMINANTLY PMN     NO SQUAMOUS EPITHELIAL CELLS SEEN     ABUNDANT GRAM POSITIVE RODS      MODERATE GRAM NEGATIVE RODS     MODERATE GRAM VARIABLE ROD   Culture ABUNDANT ENTEROCOCCUS SPECIES   Final    Report Status 01/27/2012 FINAL   Final    Organism ID, Bacteria ENTEROCOCCUS SPECIES   Final   BODY FLUID CULTURE     Status: Normal   Collection Time   01/31/12  5:41 PM      Component Value Range Status Comment   Specimen Description ABSCESS   Final    Special Requests Normal   Final    Gram Stain     Final    Value: ABUNDANT WBC PRESENT,BOTH PMN AND MONONUCLEAR     ABUNDANT GRAM NEGATIVE RODS     Gram Stain Report Called to,Read Back By and Verified With: Gram Stain Report Called to,Read Back By and Verified With: CLARISSA STATEN RN 02/01/12 8:15AM BY MILSH   Culture FEW KLEBSIELLA PNEUMONIAE   Final    Report Status 02/03/2012 FINAL   Final    Organism ID, Bacteria KLEBSIELLA PNEUMONIAE   Final     Coagulation Studies: No results found for this basename: LABPROT:5,INR:5 in the last 72 hours  Urinalysis: No results found for this basename: COLORURINE:2,APPERANCEUR:2,LABSPEC:2,PHURINE:2,GLUCOSEU:2,HGBUR:2,BILIRUBINUR:2,KETONESUR:2,PROTEINUR:2,UROBILINOGEN:2,NITRITE:2,LEUKOCYTESUR:2 in the last 72 hours    Imaging: Ct Abdomen Wo Contrast  02/09/2012  *RADIOLOGY REPORT*  Clinical Data:  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  Status post placement of a drainage catheter for abscess.  CT ABDOMEN WITHOUT CONTRAST  Technique:  Multidetector CT imaging of the abdomen was performed following the standard protocol without IV contrast.  Comparison:  CT abdomen and pelvis 02/02/2012 and 01/27/2012.  Findings:  Small left pleural effusions seen on the most recent CT scan has increased in size.  Associated compressive atelectatic change is noted.  There is some linear atelectasis in the right lung base.  No pericardial effusion on the right pleural effusion is present.  Pigtail drainage catheter remains in place in the left upper quadrant of the abdomen  localizing air fluid collection.  The collection is markedly decreased in size.  The collection had measured approximately 8.4 cm A P by 5.7 cm transverse by 16.1 cm cranial-caudal.  Today, the collection measures 5.7 cm AP by 3.7 cm transverse by 13.5 cm cranial-caudal.  Two drainage catheters localize the collection.  The second fluid collection in the upper left pelvis had measured 6.82 x 2.9 cm in the axial plane and today measures 5.7 x 1.4 cm in the axial plane on image 66.  No new or enlarging fluid collection is identified  Midline surgical wound is again identified.  Fatty infiltration of the liver without focal lesion is seen.  The gallbladder, adrenal glands, pancreas and kidneys are unremarkable.  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  IMPRESSION:  1.  Decrease in the size of a left side fluid collection consistent with abscess.  Two drainage catheters localize the remaining collection. 2.  Interval decrease in the size of a second, anterior left lower quadrant fluid collection as described above. 3.  Fatty infiltration of the liver.   Original Report Authenticated By: Bernadene Bell. Maricela Curet, M.D.      Medications:      . sodium chloride 40 mL/hr at 02/09/12 1212      . ciprofloxacin  500 mg Oral BID  . enoxaparin  30 mg Subcutaneous Q24H  . feeding supplement  237 mL Oral BID BM  . gatorade (BH)  240 mL Oral QID  . metroNIDAZOLE  250 mg Oral Q12H  . pantoprazole  40 mg Oral QHS  . sodium bicarbonate  1,300 mg Oral BID  . sodium chloride  10-40 mL Intracatheter Q12H  . DISCONTD: piperacillin-tazobactam (ZOSYN)  IV  2.25 g Intravenous Q8H   acetaminophen (TYLENOL) oral liquid 160 mg/5 mL, albuterol-ipratropium, fentaNYL, hydrALAZINE, LORazepam, magic mouthwash, ondansetron, oxyCODONE-acetaminophen, sodium chloride  Exam Gen- alert, no distress CVS- RRR RS- CTA bilat ABD- BS present soft non-distended, LLQ drain x 2, LLQ colostomy EXT- 1+ pitting edema pretibial and 1-2+ thigh  edema bilat Neuro- Ox3, no asterixis   Assessment/ Plan:  1. Acute renal failure- ATN due to vanc toxicity +/- contrast injury. Recovery phase, diuresing spontaneously. Creat down 5.7 today, continuing to recover. D/W PA, it is OK to discharge pt this weekend with elevated creatinine as long as he can be seen by a primary care provider with labs done sometime next week to follow up on this issue. OK to stop fluids at this point (done), would try to continue to avoid diuretics until renal function back near baseline.  2. Metabolic acidosis- po NaHCO3. Would stop this at time of discharge. 3. Perforated diverticulum, s/p colectomy, colostomy and drain placement-per surgery.  4. Past history urinary retention with urethral stricture- foley cath in place. Would keep in place and arrange for outpatient urology f/u (1-3 wks)  Will sign off, please call as needed.   Vinson Moselle  MD [facility] [phone] pgr    [phone] cell 02/10/2012, 12:39 PM

## 2012-02-10 NOTE — Progress Notes (Signed)
Physical Therapy Note  Attempted PT tx session. Pt requested PT check back a little later while he/wife make arrangements for his discharge. Will check back later today as schedule permits. Thanks. [name], PT [phone]

## 2012-02-10 NOTE — Progress Notes (Signed)
Physical Therapy Note  2nd attempt PT tx session. Pt/wife both report pt ambulated unit, with use of cane, just prior to PT arrival. Pt plans to d/c home over weekend. Pt/wife with no further questions concerning mobility. Continue to recommend HHPT and RW.  [name], PT [phone]

## 2012-02-10 NOTE — Progress Notes (Signed)
IV stated no blood return will have lab do blood draw. If TPN is needed will need dr order due to IV TPN previously too many times stated by IV team. Lurena Joiner, RN

## 2012-02-10 NOTE — Progress Notes (Addendum)
Patient ID: [name], male   DOB: 06/09/1970, 41 y.o.   MRN: [id number] 24 Days Post-Op  Subjective: 41 y/o male doing well today, pain well controlled with oral meds. Pt is ambulating more down the hall and is a bit sore. Pt eating well. Pt denies N/V, chest pain, SOB, or leg tenderness. Pt helped to empty colostomy bag yesterday. Wife is helping to change wounds in prep to go home this weekend.     Objective: Vital signs in last 24 hours: Temp:  [98.2 F (36.8 C)-99.1 F (37.3 C)] 99.1 F (37.3 C) (10/25 0545) Pulse Rate:  [94-99] 99  (10/25 0545) Resp:  [18-20] 18  (10/25 0545) BP: (157-161)/(91-99) 161/96 mmHg (10/25 0545) SpO2:  [96 %-99 %] 96 % (10/25 0545) Last BM Date: 02/10/12  Intake/Output from previous day: 10/24 0701 - 10/25 0700 In: 1644 [P.O.:600; I.V.:974; IV Piggyback:50] Out: 4875 [Urine:4100; Drains:75; Stool:700] Intake/Output this shift: Total I/O In: -  Out: 350 [Urine:350]  PE: General: Alert, NAD  Card: RRR, no murmurs heard  Pulm: CTA  Abd: soft, stoma stable with better surrounding granulation tissue. Stoma is patent. Wound is stable and packed. Drains with cloudy and milky drainage.  Ext: No erythema, or tenderness, +1 edema in b/l legs   Lab Results:  No results found for this basename: WBC:2,HGB:2,HCT:2,PLT:2 in the last 72 hours BMET  [facility] [place] 02/10/12 0616 02/09/12 0510  NA 133* 137  K 3.9 4.1  CL 101 106  CO2 19 19  GLUCOSE 111* 100*  BUN 43* 47*  CREATININE 5.75* 6.40*  CALCIUM 7.7* 7.8*   PT/INR No results found for this basename: LABPROT:2,INR:2 in the last 72 hours CMP     Component Value Date/Time   NA 133* 02/10/2012 0616   K 3.9 02/10/2012 0616   CL 101 02/10/2012 0616   CO2 19 02/10/2012 0616   GLUCOSE 111* 02/10/2012 0616   BUN 43* 02/10/2012 0616   CREATININE 5.75* 02/10/2012 0616   CALCIUM 7.7* 02/10/2012 0616   PROT 6.0 01/30/2012 0629   ALBUMIN 1.7* 02/10/2012 0616   AST 49* 01/30/2012 0629   ALT 89*  01/30/2012 0629   ALKPHOS 70 01/30/2012 0629   BILITOT 0.2* 01/30/2012 0629   GFRNONAA 11* 02/10/2012 0616   GFRAA 13* 02/10/2012 0616   Lipase     Component Value Date/Time   LIPASE 24 01/16/2012 1210       Studies/Results: Ct Abdomen Wo Contrast  02/09/2012  *RADIOLOGY REPORT*  Clinical Data:  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  Status post placement of a drainage catheter for abscess.  CT ABDOMEN WITHOUT CONTRAST  Technique:  Multidetector CT imaging of the abdomen was performed following the standard protocol without IV contrast.  Comparison:  CT abdomen and pelvis 02/02/2012 and 01/27/2012.  Findings:  Small left pleural effusions seen on the most recent CT scan has increased in size.  Associated compressive atelectatic change is noted.  There is some linear atelectasis in the right lung base.  No pericardial effusion on the right pleural effusion is present.  Pigtail drainage catheter remains in place in the left upper quadrant of the abdomen localizing air fluid collection.  The collection is markedly decreased in size.  The collection had measured approximately 8.4 cm A P by 5.7 cm transverse by 16.1 cm cranial-caudal.  Today, the collection measures 5.7 cm AP by 3.7 cm transverse by 13.5 cm cranial-caudal.  Two drainage catheters localize the collection.  The second  fluid collection in the upper left pelvis had measured 6.82 x 2.9 cm in the axial plane and today measures 5.7 x 1.4 cm in the axial plane on image 66.  No new or enlarging fluid collection is identified  Midline surgical wound is again identified.  Fatty infiltration of the liver without focal lesion is seen.  The gallbladder, adrenal glands, pancreas and kidneys are unremarkable.  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  IMPRESSION:  1.  Decrease in the size of a left side fluid collection consistent with abscess.  Two drainage catheters localize the remaining collection. 2.  Interval  decrease in the size of a second, anterior left lower quadrant fluid collection as described above. 3.  Fatty infiltration of the liver.   Original Report Authenticated By: Bernadene Bell. Maricela Curet, M.D.     Anti-infectives: Anti-infectives     Start     Dose/Rate Route Frequency Ordered Stop   02/10/12 1000   ciprofloxacin (CIPRO) tablet 500 mg        500 mg Oral 2 times daily 02/10/12 0916     02/10/12 1000   metroNIDAZOLE (FLAGYL) tablet 250 mg        250 mg Oral Every 12 hours 02/10/12 0916     02/06/12 1400   piperacillin-tazobactam (ZOSYN) IVPB 2.25 g  Status:  Discontinued        2.25 g 100 mL/hr over 30 Minutes Intravenous 3 times per day 02/06/12 1043 02/10/12 0916   02/03/12 1800   piperacillin-tazobactam (ZOSYN) IVPB 2.25 g  Status:  Discontinued        2.25 g 100 mL/hr over 30 Minutes Intravenous 4 times per day 02/03/12 0924 02/06/12 1043   02/01/12 2300   vancomycin (VANCOCIN) 1,500 mg in sodium chloride 0.9 % 500 mL IVPB  Status:  Discontinued        1,500 mg 250 mL/hr over 120 Minutes Intravenous Every 12 hours 02/01/12 1355 02/02/12 0924   02/01/12 0800   piperacillin-tazobactam (ZOSYN) IVPB 3.375 g  Status:  Discontinued        3.375 g 12.5 mL/hr over 240 Minutes Intravenous Every 8 hours 02/01/12 0724 02/03/12 0924   01/31/12 1000   ciprofloxacin (CIPRO) tablet 500 mg  Status:  Discontinued        500 mg Oral 2 times daily 01/31/12 0950 02/01/12 0724   01/28/12 2300   vancomycin (VANCOCIN) 2,000 mg in sodium chloride 0.9 % 500 mL IVPB  Status:  Discontinued        2,000 mg 250 mL/hr over 120 Minutes Intravenous Every 12 hours 01/27/12 2239 01/27/12 2243   01/28/12 1200   vancomycin (VANCOCIN) 2,000 mg in sodium chloride 0.9 % 500 mL IVPB  Status:  Discontinued        2,000 mg 250 mL/hr over 120 Minutes Intravenous Every 12 hours 01/27/12 2233 01/27/12 2238   01/27/12 2300   vancomycin (VANCOCIN) 750 mg in sodium chloride 0.9 % 150 mL IVPB  Status:  Discontinued          750 mg 150 mL/hr over 60 Minutes Intravenous  Once 01/27/12 2232 01/27/12 2233   01/27/12 2300   vancomycin (VANCOCIN) 2,000 mg in sodium chloride 0.9 % 500 mL IVPB  Status:  Discontinued        2,000 mg 250 mL/hr over 120 Minutes Intravenous Every 12 hours 01/27/12 2244 02/01/12 1355   01/25/12 1045   vancomycin (VANCOCIN) 1,250 mg in sodium chloride 0.9 % 250 mL IVPB  Status:  Discontinued        1,250 mg 166.7 mL/hr over 90 Minutes Intravenous 2 times daily 01/25/12 1020 01/27/12 2237   01/23/12 1300   micafungin (MYCAMINE) 100 mg in sodium chloride 0.9 % 100 mL IVPB        100 mg 100 mL/hr over 1 Hours Intravenous Daily 01/23/12 1158 01/30/12 1226   01/16/12 1800  piperacillin-tazobactam (ZOSYN) IVPB 3.375 g       3.375 g 12.5 mL/hr over 240 Minutes Intravenous 3 times per day 01/16/12 1721 01/27/12 1012           Assessment/Plan S/p Hartman's -Possible rectal stump leak with abdominal abscess  1. ARF, Cr down to 5.7 today, UOP stable-talked to Renal today who said he could go home soon if he was able to f/u with a primary care.  The patient doesn't have one yet, so pt will need to arrange it.  Discussed with family-may be able to get in with wife's PCP Dr. Sallye Ober of [place]. 2. Wound dehiscence, but no evisceration  3.  CT scan are improved, with some residual abscess, drains in place 4.  H/o ureteral stricture-may need to leave urinary catheter in place upon discharge to continue to monitor IO, pt to make appt with Dr. Jamie Kato (urologist) 5. Switch from Zosyn to PO Cipro and Flagyl 6. Cont drains  7. Cont abmulation at least QID 8. Cont Incentive Spiro 9.  Goal is to get home on Saturday or Sunday if PCP and urology are set up   LOS: 25 days    DORT, Almee Pelphrey 02/10/2012, 9:52 AM Pager: [phone]

## 2012-02-11 LAB — RENAL FUNCTION PANEL
Albumin: 1.6 g/dL — ABNORMAL LOW (ref 3.5–5.2)
BUN: 39 mg/dL — ABNORMAL HIGH (ref 6–23)
Calcium: 7.8 mg/dL — ABNORMAL LOW (ref 8.4–10.5)
Glucose, Bld: 125 mg/dL — ABNORMAL HIGH (ref 70–99)
Phosphorus: 5.1 mg/dL — ABNORMAL HIGH (ref 2.3–4.6)
Sodium: 138 mEq/L (ref 135–145)

## 2012-02-11 NOTE — Progress Notes (Signed)
Occupational Therapy Treatment Patient Details Name: [name] MRN: [id number] DOB: 1970-09-04 Today's Date: 02/11/2012 Time: 0865-7846 OT Time Calculation (min): 47 min  OT Assessment / Plan / Recommendation Comments on Treatment Session Pt doing much better today, overall supervision level for mobility in the hall with his RW.  O2 sats 98 % with mobility, HR increased to 132 BPM.  Reports slight improvement in pain with use of meds and abdominal binder.    Follow Up Recommendations  Supervision/Assistance - 24 hour;Home health OT       Equipment Recommendations  3 in 1 bedside comode       Frequency Min 2X/week   Plan Discharge plan remains appropriate    Precautions / Restrictions Precautions Precautions: Fall Restrictions Weight Bearing Restrictions: No   Pertinent Vitals/Pain Pain 4/10 with mobility, HR increased to 132 with mobility, 113 at rest, O2 sats 98% on room air    ADL  Toilet Transfer: Simulated;Supervision/safety Toilet Transfer Method: Other (comment) (with use of RW for safety) Toilet Transfer Equipment: Comfort height toilet Toileting - Clothing Manipulation and Hygiene: Other (comment);Supervision/safety (Pt stood to empty the leg bag.) Where Assessed - Toileting Clothing Manipulation and Hygiene: Sit to stand from 3-in-1 or toilet Equipment Used: Other (comment);Rolling walker (Abdominal binder) Transfers/Ambulation Related to ADLs: Pt was supervision level for mobility around the unit X2 with his RW. ADL Comments: Educated pt on the easiest way to transition from supine to sit EOB by rolling to the right side, away from his drain sites and then transitioning to sitting.  Pt with less pain but still rated at a 4/10.  Discussed use of AE for LB selfcare and pt remembers use earlier sessions.  His father reports that he will pick up on Monday for pt since gift ship is currently closed.   Pt overall supervision level for functional transfers and simulated  selfcare tasks.  Needs some increased time to transition from one position to the next, but requires no physical assist.  Did assist pt however with donning abdominal binder and pinning his drains to his gown.       OT Goals Acute Rehab OT Goals Time For Goal Achievement: 02/25/12 Potential to Achieve Goals: Good ADL Goals Pt Will Perform Grooming: Independently ADL Goal: Grooming - Progress: Updated due to goal met Pt Will Perform Lower Body Bathing: with modified independence;Sit to stand from chair;with adaptive equipment ADL Goal: Lower Body Bathing - Progress: Updated due to goal met Pt Will Perform Lower Body Dressing: with modified independence;Sit to stand from chair;Sit to stand from bed;with adaptive equipment ADL Goal: Lower Body Dressing - Progress: Met Pt Will Transfer to Toilet: with modified independence;Ambulation;with DME;3-in-1 ADL Goal: Toilet Transfer - Progress: Goal set today Additional ADL Goal #1: Pt will peform bilateral UE therex with independence following handout in order to increase overall strength and activity tolerance for ADLs. ADL Goal: Additional Goal #1 - Progress: Goal set today  Visit Information  Last OT Received On: 02/11/12 Assistance Needed: +1    Subjective Data  Subjective: "I'll give it a try, but I didn't do any good this morning." Patient Stated Goal: To get back home to his chickens.      Cognition  Overall Cognitive Status: Appears within functional limits for tasks assessed/performed Arousal/Alertness: Awake/alert Orientation Level: Appears intact for tasks assessed Behavior During Session: [facility] for tasks performed    Mobility  Shoulder Instructions Bed Mobility Bed Mobility: Rolling Right;Right Sidelying to Sit Rolling Right: 5: Supervision;With rail  Right Sidelying to Sit: 5: Supervision;HOB elevated Sitting - Scoot to Edge of Bed: 6: Modified independent (Device/Increase time) Transfers Transfers: Sit to Stand Sit to  Stand: 5: Supervision;With upper extremity assist Stand to Sit: 5: Supervision;With upper extremity assist          Balance Balance Balance Assessed: Yes Static Standing Balance Static Standing - Balance Support: Bilateral upper extremity supported Static Standing - Level of Assistance: 5: Stand by assistance;Other (comment) (with use of RW) Dynamic Standing Balance Dynamic Standing - Balance Support: Right upper extremity supported;Left upper extremity supported Dynamic Standing - Level of Assistance: 5: Stand by assistance Dynamic Standing - Comments: Pt able to stand and ambulate for 5-7 minute intervals before needing rest break.   End of Session OT - End of Session Activity Tolerance: Patient limited by fatigue Patient left: in bed;with family/visitor present Nurse Communication: Mobility status     Calyb Mcquarrie OTR/L Pager number [phone] 02/11/2012, 4:10 PM

## 2012-02-11 NOTE — Progress Notes (Signed)
Patient ID: EIVAN STEPPER, male   DOB: [birth year], 41 y.o.   MRN: [id number] 25 Days Post-Op  Subjective: 41 y/o male s/p colon resection and ostomy. doing well today, pain well controlled with oral meds. Pt is ambulating more down the hall and is a bit sore. Pt eating well. Pt denies N/V, chest pain, SOB, or leg tenderness. Family is preparing for discharge.  Objective: Vital signs in last 24 hours: Temp:  [98.5 F (36.9 C)-98.9 F (37.2 C)] 98.9 F (37.2 C) (10/26 0865) Pulse Rate:  [78-97] 87  (10/26 0608) Resp:  [18-20] 18  (10/26 7846) BP: (140-161)/(82-96) 153/82 mmHg (10/26 0608) SpO2:  [99 %] 99 % (10/26 0608) Last BM Date: 02/10/12  Intake/Output from previous day: 10/25 0701 - 10/26 0700 In: 2902.7 [P.O.:1920; I.V.:952.7] Out: 6090 [Urine:4850; Drains:60; Stool:1180] Intake/Output this shift:    PE: General: Alert, NAD  Card: RRR, no murmurs heard  Pulm: CTA  Abd: soft, stoma stable and patent. Wound is stable and packed. Drains with cloudy and milky drainage.  Ext: No erythema, or tenderness, +1 edema in b/l legs   Lab Results:  No results found for this basename: WBC:2,HGB:2,HCT:2,PLT:2 in the last 72 hours BMET  [facility] 02/11/12 0441 02/10/12 0616  NA 138 133*  K 3.8 3.9  CL 103 101  CO2 22 19  GLUCOSE 125* 111*  BUN 39* 43*  CREATININE 4.93* 5.75*  CALCIUM 7.8* 7.7*   PT/INR No results found for this basename: LABPROT:2,INR:2 in the last 72 hours CMP     Component Value Date/Time   NA 138 02/11/2012 0441   K 3.8 02/11/2012 0441   CL 103 02/11/2012 0441   CO2 22 02/11/2012 0441   GLUCOSE 125* 02/11/2012 0441   BUN 39* 02/11/2012 0441   CREATININE 4.93* 02/11/2012 0441   CALCIUM 7.8* 02/11/2012 0441   PROT 6.0 01/30/2012 0629   ALBUMIN 1.6* 02/11/2012 0441   AST 49* 01/30/2012 0629   ALT 89* 01/30/2012 0629   ALKPHOS 70 01/30/2012 0629   BILITOT 0.2* 01/30/2012 0629   GFRNONAA 13* 02/11/2012 0441   GFRAA 16* 02/11/2012 0441   Lipase       Component Value Date/Time   LIPASE 24 01/16/2012 1210       Studies/Results: Ct Abdomen Wo Contrast  02/09/2012  *RADIOLOGY REPORT*  Clinical Data:  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  Status post placement of a drainage catheter for abscess.  CT ABDOMEN WITHOUT CONTRAST  Technique:  Multidetector CT imaging of the abdomen was performed following the standard protocol without IV contrast.  Comparison:  CT abdomen and pelvis 02/02/2012 and 01/27/2012.  Findings:  Small left pleural effusions seen on the most recent CT scan has increased in size.  Associated compressive atelectatic change is noted.  There is some linear atelectasis in the right lung base.  No pericardial effusion on the right pleural effusion is present.  Pigtail drainage catheter remains in place in the left upper quadrant of the abdomen localizing air fluid collection.  The collection is markedly decreased in size.  The collection had measured approximately 8.4 cm A P by 5.7 cm transverse by 16.1 cm cranial-caudal.  Today, the collection measures 5.7 cm AP by 3.7 cm transverse by 13.5 cm cranial-caudal.  Two drainage catheters localize the collection.  The second fluid collection in the upper left pelvis had measured 6.82 x 2.9 cm in the axial plane and today measures 5.7 x 1.4 cm in the axial  plane on image 66.  No new or enlarging fluid collection is identified  Midline surgical wound is again identified.  Fatty infiltration of the liver without focal lesion is seen.  The gallbladder, adrenal glands, pancreas and kidneys are unremarkable.  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  IMPRESSION:  1.  Decrease in the size of a left side fluid collection consistent with abscess.  Two drainage catheters localize the remaining collection. 2.  Interval decrease in the size of a second, anterior left lower quadrant fluid collection as described above. 3.  Fatty infiltration of the liver.   Original Report  Authenticated By: Bernadene Bell. Maricela Curet, M.D.     Anti-infectives: Anti-infectives     Start     Dose/Rate Route Frequency Ordered Stop   02/10/12 1000   ciprofloxacin (CIPRO) tablet 500 mg        500 mg Oral 2 times daily 02/10/12 0916     02/10/12 1000   metroNIDAZOLE (FLAGYL) tablet 250 mg        250 mg Oral Every 12 hours 02/10/12 0916     02/06/12 1400   piperacillin-tazobactam (ZOSYN) IVPB 2.25 g  Status:  Discontinued        2.25 g 100 mL/hr over 30 Minutes Intravenous 3 times per day 02/06/12 1043 02/10/12 0916   02/03/12 1800   piperacillin-tazobactam (ZOSYN) IVPB 2.25 g  Status:  Discontinued        2.25 g 100 mL/hr over 30 Minutes Intravenous 4 times per day 02/03/12 0924 02/06/12 1043   02/01/12 2300   vancomycin (VANCOCIN) 1,500 mg in sodium chloride 0.9 % 500 mL IVPB  Status:  Discontinued        1,500 mg 250 mL/hr over 120 Minutes Intravenous Every 12 hours 02/01/12 1355 02/02/12 0924   02/01/12 0800   piperacillin-tazobactam (ZOSYN) IVPB 3.375 g  Status:  Discontinued        3.375 g 12.5 mL/hr over 240 Minutes Intravenous Every 8 hours 02/01/12 0724 02/03/12 0924   01/31/12 1000   ciprofloxacin (CIPRO) tablet 500 mg  Status:  Discontinued        500 mg Oral 2 times daily 01/31/12 0950 02/01/12 0724   01/28/12 2300   vancomycin (VANCOCIN) 2,000 mg in sodium chloride 0.9 % 500 mL IVPB  Status:  Discontinued        2,000 mg 250 mL/hr over 120 Minutes Intravenous Every 12 hours 01/27/12 2239 01/27/12 2243   01/28/12 1200   vancomycin (VANCOCIN) 2,000 mg in sodium chloride 0.9 % 500 mL IVPB  Status:  Discontinued        2,000 mg 250 mL/hr over 120 Minutes Intravenous Every 12 hours 01/27/12 2233 01/27/12 2238   01/27/12 2300   vancomycin (VANCOCIN) 750 mg in sodium chloride 0.9 % 150 mL IVPB  Status:  Discontinued        750 mg 150 mL/hr over 60 Minutes Intravenous  Once 01/27/12 2232 01/27/12 2233   01/27/12 2300   vancomycin (VANCOCIN) 2,000 mg in sodium chloride  0.9 % 500 mL IVPB  Status:  Discontinued        2,000 mg 250 mL/hr over 120 Minutes Intravenous Every 12 hours 01/27/12 2244 02/01/12 1355   01/25/12 1045   vancomycin (VANCOCIN) 1,250 mg in sodium chloride 0.9 % 250 mL IVPB  Status:  Discontinued        1,250 mg 166.7 mL/hr over 90 Minutes Intravenous 2 times daily 01/25/12 1020 01/27/12 2237   01/23/12 1300  micafungin (MYCAMINE) 100 mg in sodium chloride 0.9 % 100 mL IVPB        100 mg 100 mL/hr over 1 Hours Intravenous Daily 01/23/12 1158 01/30/12 1226   01/16/12 1800   piperacillin-tazobactam (ZOSYN) IVPB 3.375 g        3.375 g 12.5 mL/hr over 240 Minutes Intravenous 3 times per day 01/16/12 1721 01/27/12 1012           Assessment/Plan S/p Hartman's -Possible rectal stump leak with abdominal abscess  1. ARF, Cr down again today, UOP stable-per Renal note: Pt could go home soon if he was able to f/u with a primary care- family has PCP apt on tues afternoon.  T 2. Wound dehiscence, but no evisceration- cont packing  3.  CT scan are improved, with some residual abscess, drains in place 4.  H/o ureteral stricture-will need to leave urinary catheter in place upon discharge and continue to monitor IO, pt has appt with Dr. Jamie Kato (urologist) next [facility] 5. Cont PO Cipro and Flagyl 6. Cont drains  7. Cont abmulation at least QID 8. Cont Incentive Spiro 9.  Goal is to get home on Sunday as long as Cr continues to trend down tomorrow   LOS: 26 days    Crandall, Kahli Fitzgerald C. 02/11/2012, 10:00 AM Pager: [phone]

## 2012-02-11 NOTE — Progress Notes (Signed)
25 Days Post-Op  Subjective: Left abd drain placed 10/8; additional left abd drain placed 10/18 Feeling better CT 10/24 shows better but not resolved  Objective: Vital signs in last 24 hours: Temp:  [98.5 F (36.9 C)-98.9 F (37.2 C)] 98.9 F (37.2 C) (10/26 1610) Pulse Rate:  [78-97] 87  (10/26 0608) Resp:  [18-20] 18  (10/26 9604) BP: (140-161)/(82-96) 153/82 mmHg (10/26 0608) SpO2:  [99 %] 99 % (10/26 0608) Last BM Date: 02/10/12  Intake/Output from previous day: 10/25 0701 - 10/26 0700 In: 2902.7 [P.O.:1920; I.V.:952.7] Out: 6090 [Urine:4850; Drains:60; Stool:1180] Intake/Output this shift:    PE:  Afeb; VSS Left abd superior drain output 35 cc 10/25; scant today: thick milky yellow               Inferior drain output 25 cc 10/25; scant today: thick milky yellow Cx + enterococcus Sites clean and dry; nt; no bleeding  Lab Results:  No results found for this basename: WBC:2,HGB:2,HCT:2,PLT:2 in the last 72 hours BMET  Basename 02/11/12 0441 02/10/12 0616  NA 138 133*  K 3.8 3.9  CL 103 101  CO2 22 19  GLUCOSE 125* 111*  BUN 39* 43*  CREATININE 4.93* 5.75*  CALCIUM 7.8* 7.7*   PT/INR No results found for this basename: LABPROT:2,INR:2 in the last 72 hours ABG No results found for this basename: PHART:2,PCO2:2,PO2:2,HCO3:2 in the last 72 hours  Studies/Results: Ct Abdomen Wo Contrast  02/09/2012  *RADIOLOGY REPORT*  Clinical Data:  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  Status post placement of a drainage catheter for abscess.  CT ABDOMEN WITHOUT CONTRAST  Technique:  Multidetector CT imaging of the abdomen was performed following the standard protocol without IV contrast.  Comparison:  CT abdomen and pelvis 02/02/2012 and 01/27/2012.  Findings:  Small left pleural effusions seen on the most recent CT scan has increased in size.  Associated compressive atelectatic change is noted.  There is some linear atelectasis in the right  lung base.  No pericardial effusion on the right pleural effusion is present.  Pigtail drainage catheter remains in place in the left upper quadrant of the abdomen localizing air fluid collection.  The collection is markedly decreased in size.  The collection had measured approximately 8.4 cm A P by 5.7 cm transverse by 16.1 cm cranial-caudal.  Today, the collection measures 5.7 cm AP by 3.7 cm transverse by 13.5 cm cranial-caudal.  Two drainage catheters localize the collection.  The second fluid collection in the upper left pelvis had measured 6.82 x 2.9 cm in the axial plane and today measures 5.7 x 1.4 cm in the axial plane on image 66.  No new or enlarging fluid collection is identified  Midline surgical wound is again identified.  Fatty infiltration of the liver without focal lesion is seen.  The gallbladder, adrenal glands, pancreas and kidneys are unremarkable.  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  IMPRESSION:  1.  Decrease in the size of a left side fluid collection consistent with abscess.  Two drainage catheters localize the remaining collection. 2.  Interval decrease in the size of a second, anterior left lower quadrant fluid collection as described above. 3.  Fatty infiltration of the liver.   Original Report Authenticated By: Bernadene Bell. Maricela Curet, M.D.     Anti-infectives:   Assessment/Plan: s/p Procedure(s) (LRB) with comments: PARTIAL COLECTOMY (N/A) COLOSTOMY (N/A)   LOS: 26 days   Left abd drain placed 10/8 New additional drain placed 10/18 Still  significant output Will follow Plan per [facility]  [name] A 02/11/2012

## 2012-02-12 LAB — RENAL FUNCTION PANEL
CO2: 25 mEq/L (ref 19–32)
Calcium: 7.9 mg/dL — ABNORMAL LOW (ref 8.4–10.5)
Creatinine, Ser: 4.27 mg/dL — ABNORMAL HIGH (ref 0.50–1.35)
Glucose, Bld: 117 mg/dL — ABNORMAL HIGH (ref 70–99)
Phosphorus: 5.1 mg/dL — ABNORMAL HIGH (ref 2.3–4.6)

## 2012-02-12 MED ORDER — CIPROFLOXACIN HCL 500 MG PO TABS: 500.0000 mg | ORAL_TABLET | Freq: Two times a day (BID) | ORAL | Status: DC

## 2012-02-12 MED ORDER — HYDROCODONE-ACETAMINOPHEN 5-500 MG PO TABS: 1.0000 | ORAL_TABLET | Freq: Four times a day (QID) | ORAL | Status: DC | PRN

## 2012-02-12 MED ORDER — METRONIDAZOLE 250 MG PO TABS: 500.0000 mg | ORAL_TABLET | Freq: Two times a day (BID) | ORAL | Status: DC

## 2012-02-12 NOTE — Progress Notes (Signed)
Pt significant other at bedside.  She doesn't feel up to changing dressing this am, she however did tell me the correct steps and procedure to change patient midline abdominal dressing and the procedure to flush abscess drain.  Pt lower abscess drain with increased movement of tube.  Will pass information on to [employer] in report.

## 2012-02-12 NOTE — Progress Notes (Signed)
Pt and wife voice understanding of dressing change , colostomy and  flushing of drain and how to keep record. Voices understanding of when to call md and f/u appointments

## 2012-02-12 NOTE — Discharge Summary (Signed)
Physician Discharge Summary  Patient ID: FERLIN PETRENKO MRN: [id number] DOB/AGE: 1970-10-05 41 y.o.  Admit date: 01/16/2012 Discharge date: 02/12/2012  Admission Diagnoses: perforated colon  Discharge Diagnoses: same Principal Problem:  *Colon obstruction Active Problems:  UTI (lower urinary tract infection)  Diverticulitis of colon without hemorrhage  Obesity, Class III, BMI 40-49.9 (morbid obesity)  Acute respiratory failure with hypoxia  Acute pulmonary edema  Encephalopathy acute  Tobacco abuse  Intra-abdominal abscess, diverticular s/p surgical drainage  Hypertriglyceridemia   Discharged Condition: good  Hospital Course: The patient was admitted for a colonic perforation.  He had a prolonged hospital course with multiple complications.  Please see hospital records for complete  details.    Discharge Exam: Blood pressure 161/101, pulse 89, temperature 98 F (36.7 C), temperature source Oral, resp. rate 18, height 5\' 10"  (1.778 m), weight 280 lb 6.4 oz (127.189 kg), SpO2 96.00%. General appearance: alert, cooperative and no distress Resp: clear to auscultation bilaterally Cardio: regular rate and rhythm GI: soft, nontender Incision/Wound:open, packed, clean Drain: #1 not holding suction- removed.  Drain #2 in place  Disposition: 01-Home or Self Care     Medication List     As of 02/12/2012 10:05 AM    ASK your doctor about these medications         cyclobenzaprine 10 MG tablet   Commonly known as: FLEXERIL   Take 1 tablet (10 mg total) by mouth 2 (two) times daily as needed for muscle spasms.      HYDROcodone-acetaminophen 5-500 MG per tablet   Commonly known as: VICODIN   Take 1-2 tablets by mouth every 6 (six) hours as needed for pain.      HYDROcodone-acetaminophen 5-325 MG per tablet   Commonly known as: NORCO/VICODIN   Take 1 tablet by mouth every 6 (six) hours as needed. For pain.      naproxen sodium 220 MG tablet   Commonly known as: ANAPROX     Take 440 mg by mouth 2 (two) times daily with a meal.           Follow-up Information    Call Valetta Fuller, MD.   Contact information:   [address] [facility] [place] [address] [phone]       Call Johann Capers, MD.   Contact information:   [address] [place] [address] [phone]          Signed: Vanita Panda 02/12/2012, 10:05 AM

## 2012-02-12 NOTE — Progress Notes (Signed)
Multiple calls to Dr Maisie Fus to get time interval for surgical follow up, order to d/c picc line etc. Case management also notified me need to have a Face to Face form filled out for home health-Dr Maisie Fus notified. Stated to cancel discharge. Pt states he is more comfortable with this decision also.  Order noted.

## 2012-02-12 NOTE — Progress Notes (Signed)
26 Days Post-Op  Subjective: Post colectomy abscess Drain #1 placed left abd 10/8 #2 placed 10/18 Better daily Drain #1 not holding charge today  Objective: Vital signs in last 24 hours: Temp:  [98 F (36.7 C)-99.4 F (37.4 C)] 98 F (36.7 C) (10/27 0500) Pulse Rate:  [89-132] 89  (10/27 0500) Resp:  [18] 18  (10/27 0500) BP: (147-176)/(90-110) 161/101 mmHg (10/27 0500) SpO2:  [96 %-98 %] 96 % (10/27 0500) Weight:  [280 lb 6.4 oz (127.189 kg)] 280 lb 6.4 oz (127.189 kg) (10/27 0747) Last BM Date: 02/11/12 (colosotmy to lt quad)  Intake/Output from previous day: 10/26 0701 - 10/27 0700 In: 823.3 [P.O.:600; I.V.:193.3] Out: 4085 [Urine:3805; Drains:5; Stool:275] Intake/Output this shift: Total I/O In: 20 [I.V.:20] Out: -   PE:  Afeb; vss Wbc wnl Output diminished; scant from both Milky #2 #1 drain has evidence of side hole exposure - will not hold charge Removed per Dr Maisie Fus ([facility]) No sign of infection   Lab Results:  No results found for this basename: WBC:2,HGB:2,HCT:2,PLT:2 in the last 72 hours BMET  [facility] 02/12/12 0444 02/11/12 0441  NA 141 138  K 4.1 3.8  CL 105 103  CO2 25 22  GLUCOSE 117* 125*  BUN 35* 39*  CREATININE 4.27* 4.93*  CALCIUM 7.9* 7.8*   PT/INR No results found for this basename: LABPROT:2,INR:2 in the last 72 hours ABG No results found for this basename: PHART:2,PCO2:2,PO2:2,HCO3:2 in the last 72 hours  Studies/Results: No results found.  Anti-infectives:   Assessment/Plan: s/p Procedure(s) (LRB) with comments: PARTIAL COLECTOMY (N/A) COLOSTOMY (N/A)   LOS: 27 days   Drain #1 removed Drain #2 intact Home with this drain Will need re CT when output under 10 cc daily Dc per [facility] today probable  Dereon Williamsen A 02/12/2012

## 2012-02-13 LAB — RENAL FUNCTION PANEL
BUN: 29 mg/dL — ABNORMAL HIGH (ref 6–23)
CO2: 26 mEq/L (ref 19–32)
Chloride: 103 mEq/L (ref 96–112)
Creatinine, Ser: 3.65 mg/dL — ABNORMAL HIGH (ref 0.50–1.35)
Glucose, Bld: 113 mg/dL — ABNORMAL HIGH (ref 70–99)

## 2012-02-13 NOTE — Progress Notes (Signed)
Patient ID: [name], male   DOB: 1970-10-30, 41 y.o.   MRN: [id number] 27 Days Post-Op  Subjective: 41 y/o male s/p colon resection and ostomy. doing well today, pain well controlled with oral meds. Pt is ambulating more down the hall. Pt eating well. Pt denies N/V, chest pain, SOB, or leg tenderness. Family is preparing for discharge.  Ready to go today  Objective: Vital signs in last 24 hours: Temp:  [97.9 F (36.6 C)-99.3 F (37.4 C)] 99.3 F (37.4 C) (10/28 0523) Pulse Rate:  [82-96] 87  (10/28 0523) Resp:  [16-18] 18  (10/28 0523) BP: (144-158)/(91-99) 158/99 mmHg (10/28 0523) SpO2:  [96 %-97 %] 96 % (10/28 0523) Weight:  [267 lb 3.2 oz (121.2 kg)] 267 lb 3.2 oz (121.2 kg) (10/28 0523) Last BM Date: 02/13/12  Intake/Output from previous day: 10/27 0701 - 10/28 0700 In: 1695 [P.O.:1660; I.V.:20] Out: 4325 [Urine:4325] Intake/Output this shift: Total I/O In: 260 [P.O.:240; I.V.:10; Other:10] Out: 450 [Urine:450]  PE: General: Alert, NAD  Abd: soft, stoma stable and patent. Wound is stable and packed. Drain with cloudy and milky drainage.  Ext: No erythema, or tenderness, +1 edema in b/l legs   Lab Results:  No results found for this basename: WBC:2,HGB:2,HCT:2,PLT:2 in the last 72 hours BMET  [facility] 02/13/12 0423 02/12/12 0444  NA 141 141  K 3.6 4.1  CL 103 105  CO2 26 25  GLUCOSE 113* 117*  BUN 29* 35*  CREATININE 3.65* 4.27*  CALCIUM 8.2* 7.9*   PT/INR No results found for this basename: LABPROT:2,INR:2 in the last 72 hours CMP     Component Value Date/Time   NA 141 02/13/2012 0423   K 3.6 02/13/2012 0423   CL 103 02/13/2012 0423   CO2 26 02/13/2012 0423   GLUCOSE 113* 02/13/2012 0423   BUN 29* 02/13/2012 0423   CREATININE 3.65* 02/13/2012 0423   CALCIUM 8.2* 02/13/2012 0423   PROT 6.0 01/30/2012 0629   ALBUMIN 1.9* 02/13/2012 0423   AST 49* 01/30/2012 0629   ALT 89* 01/30/2012 0629   ALKPHOS 70 01/30/2012 0629   BILITOT 0.2* 01/30/2012  0629   GFRNONAA 19* 02/13/2012 0423   GFRAA 22* 02/13/2012 0423   Lipase     Component Value Date/Time   LIPASE 24 01/16/2012 1210       Studies/Results: No results found.  Anti-infectives: Anti-infectives     Start     Dose/Rate Route Frequency Ordered Stop   02/12/12 0000   ciprofloxacin (CIPRO) 500 MG tablet        500 mg Oral 2 times daily 02/12/12 1015     02/12/12 0000   metroNIDAZOLE (FLAGYL) 250 MG tablet        500 mg Oral Every 12 hours 02/12/12 1015     02/10/12 1000   ciprofloxacin (CIPRO) tablet 500 mg        500 mg Oral 2 times daily 02/10/12 0916     02/10/12 1000   metroNIDAZOLE (FLAGYL) tablet 250 mg        250 mg Oral Every 12 hours 02/10/12 0916     02/06/12 1400   piperacillin-tazobactam (ZOSYN) IVPB 2.25 g  Status:  Discontinued        2.25 g 100 mL/hr over 30 Minutes Intravenous 3 times per day 02/06/12 1043 02/10/12 0916   02/03/12 1800   piperacillin-tazobactam (ZOSYN) IVPB 2.25 g  Status:  Discontinued        2.25 g 100 mL/hr over  30 Minutes Intravenous 4 times per day 02/03/12 0924 02/06/12 1043   02/01/12 2300   vancomycin (VANCOCIN) 1,500 mg in sodium chloride 0.9 % 500 mL IVPB  Status:  Discontinued        1,500 mg 250 mL/hr over 120 Minutes Intravenous Every 12 hours 02/01/12 1355 02/02/12 0924   02/01/12 0800   piperacillin-tazobactam (ZOSYN) IVPB 3.375 g  Status:  Discontinued        3.375 g 12.5 mL/hr over 240 Minutes Intravenous Every 8 hours 02/01/12 0724 02/03/12 0924   01/31/12 1000   ciprofloxacin (CIPRO) tablet 500 mg  Status:  Discontinued        500 mg Oral 2 times daily 01/31/12 0950 02/01/12 0724   01/28/12 2300   vancomycin (VANCOCIN) 2,000 mg in sodium chloride 0.9 % 500 mL IVPB  Status:  Discontinued        2,000 mg 250 mL/hr over 120 Minutes Intravenous Every 12 hours 01/27/12 2239 01/27/12 2243   01/28/12 1200   vancomycin (VANCOCIN) 2,000 mg in sodium chloride 0.9 % 500 mL IVPB  Status:  Discontinued        2,000  mg 250 mL/hr over 120 Minutes Intravenous Every 12 hours 01/27/12 2233 01/27/12 2238   01/27/12 2300   vancomycin (VANCOCIN) 750 mg in sodium chloride 0.9 % 150 mL IVPB  Status:  Discontinued        750 mg 150 mL/hr over 60 Minutes Intravenous  Once 01/27/12 2232 01/27/12 2233   01/27/12 2300   vancomycin (VANCOCIN) 2,000 mg in sodium chloride 0.9 % 500 mL IVPB  Status:  Discontinued        2,000 mg 250 mL/hr over 120 Minutes Intravenous Every 12 hours 01/27/12 2244 02/01/12 1355   01/25/12 1045   vancomycin (VANCOCIN) 1,250 mg in sodium chloride 0.9 % 250 mL IVPB  Status:  Discontinued        1,250 mg 166.7 mL/hr over 90 Minutes Intravenous 2 times daily 01/25/12 1020 01/27/12 2237   01/23/12 1300   micafungin (MYCAMINE) 100 mg in sodium chloride 0.9 % 100 mL IVPB        100 mg 100 mL/hr over 1 Hours Intravenous Daily 01/23/12 1158 01/30/12 1226   01/16/12 1800  piperacillin-tazobactam (ZOSYN) IVPB 3.375 g       3.375 g 12.5 mL/hr over 240 Minutes Intravenous 3 times per day 01/16/12 1721 01/27/12 1012           Assessment/Plan S/p Hartman's -Possible rectal stump leak with abdominal abscess  1. ARF, Cr down again today, UOP stable-per Renal note: F/U with a primary care- family has PCP apt on tues afternoon.  2. Wound dehiscence, but no evisceration- cont packing  3.  CT scan are improved, with some residual abscess, drains in place 4.  H/o ureteral stricture-will need to leave urinary catheter in place upon discharge and continue to monitor IO, pt has appt with Dr. Jamie [name] (urologist) next [facility] 5. Cont PO Cipro and Flagyl 6. Cont drains  7. Cont abmulation at least QID 8. Cont Incentive Spiro 9. Home today with home health, f/u in one week with our office.   LOS: 28 days    WHITE, [name] 02/13/2012, 12:11 PM   He feels well.  Ambulatory.  Should be okay for discharge.

## 2012-02-15 ENCOUNTER — Telehealth (INDEPENDENT_AMBULATORY_CARE_PROVIDER_SITE_OTHER): Payer: Self-pay

## 2012-02-15 NOTE — Telephone Encounter (Signed)
Marchelle Folks from [facility] called to say patients and her can not get drain to flush. When they are flushing saline in drain, it drains out where drain is going in his body and not back in drain. Paged Dr. Luisa Hart and wants them to stop doing flushes. Marchelle Folks will let patient know and we will follow up with him on Monday at his scheduled appointment.

## 2012-02-20 ENCOUNTER — Other Ambulatory Visit (INDEPENDENT_AMBULATORY_CARE_PROVIDER_SITE_OTHER): Payer: Self-pay | Admitting: Surgery

## 2012-02-20 ENCOUNTER — Other Ambulatory Visit (INDEPENDENT_AMBULATORY_CARE_PROVIDER_SITE_OTHER): Payer: Self-pay

## 2012-02-20 ENCOUNTER — Telehealth (INDEPENDENT_AMBULATORY_CARE_PROVIDER_SITE_OTHER): Payer: Self-pay

## 2012-02-20 ENCOUNTER — Encounter (INDEPENDENT_AMBULATORY_CARE_PROVIDER_SITE_OTHER): Payer: Self-pay | Admitting: Surgery

## 2012-02-20 ENCOUNTER — Ambulatory Visit (INDEPENDENT_AMBULATORY_CARE_PROVIDER_SITE_OTHER): Payer: Self-pay | Admitting: Surgery

## 2012-02-20 VITALS — BP 112/70 | HR 88 | Temp 97.2°F | Resp 18 | Ht 70.0 in | Wt 258.0 lb

## 2012-02-20 DIAGNOSIS — Z9889 Other specified postprocedural states: Secondary | ICD-10-CM

## 2012-02-20 NOTE — Progress Notes (Signed)
Patient returns after exploratory laparotomy for sigmoid colon perforation secondary to stricture one month ago. His postop course was complicated by acute renal failure, acute respiratory failure, sepsis, intra-abdominal abscess status post percutaneous drainage.  He has at home with home health. He is weak but doing well.  Exam: Midline wound is open and packed with good regulation tissue. The fascia is intact but sutures are also visible. No exposed bowel. Ostomy functioning well. There is purulent drainage around the left percutaneous drain site the drain no longer flushes.  Impression: Status post emergent sigmoid colectomy colostomy and colonic perforation secondary to diverticular stricture with multiple postop complications with drainage around his  drain site  Plan: We'll have him return to interventional radiology to see the drain cavity upsized for a new drain to be put in. His renal failure is improving but his creatinine still 2.5 therefore IV contrast should not be used. By mouth contrast should be safe in this setting. Return to clinic 2 weeks. Continue wound care.

## 2012-02-20 NOTE — Patient Instructions (Addendum)
Continue wound care.  Will set you up for drain exchange this week with radiology.

## 2012-02-20 NOTE — Telephone Encounter (Signed)
The dad called today and reports that the drain tube in his side is leaking around the tube.  It is a thick brown drainage.  He is coming in today.  Sometimes the drainage gushes out.  He wanted to give you a heads up.

## 2012-02-22 ENCOUNTER — Ambulatory Visit (HOSPITAL_COMMUNITY)
Admission: RE | Admit: 2012-02-22 | Discharge: 2012-02-22 | Disposition: A | Discharge status: Home / Self Care | Payer: Medicaid Other | Source: Ambulatory Visit | Attending: Surgery | Admitting: Surgery

## 2012-02-22 ENCOUNTER — Other Ambulatory Visit (INDEPENDENT_AMBULATORY_CARE_PROVIDER_SITE_OTHER): Payer: Self-pay | Admitting: Surgery

## 2012-02-22 ENCOUNTER — Other Ambulatory Visit (HOSPITAL_COMMUNITY): Payer: Self-pay | Admitting: Interventional Radiology

## 2012-02-22 ENCOUNTER — Inpatient Hospital Stay (HOSPITAL_COMMUNITY): Admission: RE | Admit: 2012-02-22 | Payer: Self-pay | Source: Ambulatory Visit

## 2012-02-22 ENCOUNTER — Ambulatory Visit (HOSPITAL_COMMUNITY)
Admission: RE | Admit: 2012-02-22 | Discharge: 2012-02-22 | Disposition: A | Discharge status: Home / Self Care | Payer: Medicaid Other | Source: Ambulatory Visit | Attending: Interventional Radiology | Admitting: Interventional Radiology

## 2012-02-22 ENCOUNTER — Other Ambulatory Visit: Payer: Self-pay | Admitting: Oncology

## 2012-02-22 DIAGNOSIS — Z9889 Other specified postprocedural states: Secondary | ICD-10-CM

## 2012-02-22 DIAGNOSIS — Z933 Colostomy status: Secondary | ICD-10-CM | POA: Insufficient documentation

## 2012-02-22 DIAGNOSIS — K651 Peritoneal abscess: Secondary | ICD-10-CM | POA: Diagnosis present

## 2012-02-22 DIAGNOSIS — IMO0002 Reserved for concepts with insufficient information to code with codable children: Secondary | ICD-10-CM

## 2012-02-22 MED ORDER — LIDOCAINE HCL 1 % IJ SOLN
INTRAMUSCULAR | Status: AC
  Filled 2012-02-22: qty 20

## 2012-02-22 MED ORDER — DIAZEPAM 5 MG PO TABS
5.0000 mg | ORAL_TABLET | Freq: Once | ORAL | Status: AC
  Administered 2012-02-22: 5 mg via ORAL

## 2012-02-22 MED ORDER — DIAZEPAM 5 MG PO TABS
ORAL_TABLET | ORAL | Status: AC
  Filled 2012-02-22: qty 1

## 2012-02-22 MED ORDER — IOHEXOL 300 MG/ML  SOLN
20.0000 mL | Freq: Once | INTRAMUSCULAR | Status: AC | PRN
  Administered 2012-02-22: 20 mL

## 2012-02-23 ENCOUNTER — Telehealth (INDEPENDENT_AMBULATORY_CARE_PROVIDER_SITE_OTHER): Payer: Self-pay

## 2012-02-23 NOTE — Telephone Encounter (Signed)
Nurse called for verbal order to change visits to 1xwk x 4wks.  Pt's wife is able to perform dressing changes.  Home health would like to see him 1 day a week to check on his progress.  Ok'd verbal order for Dr. Luisa Hart

## 2012-02-27 ENCOUNTER — Ambulatory Visit (INDEPENDENT_AMBULATORY_CARE_PROVIDER_SITE_OTHER): Payer: Self-pay | Admitting: Surgery

## 2012-02-27 ENCOUNTER — Encounter (INDEPENDENT_AMBULATORY_CARE_PROVIDER_SITE_OTHER): Payer: Self-pay | Admitting: Surgery

## 2012-02-27 VITALS — BP 120/88 | HR 86 | Temp 98.4°F | Resp 16 | Ht 70.0 in | Wt 253.2 lb

## 2012-02-27 DIAGNOSIS — Z9889 Other specified postprocedural states: Secondary | ICD-10-CM

## 2012-02-27 MED ORDER — HYDROCODONE-ACETAMINOPHEN 5-325 MG PO TABS
1.0000 | ORAL_TABLET | Freq: Four times a day (QID) | ORAL | Status: DC | PRN
Start: 2012-02-27 — End: 2012-03-16

## 2012-02-27 NOTE — Progress Notes (Signed)
Patient returns due to leakage around his percutaneous drain site. It was upsized last week but he continues to have drainage. Denies any significant fever or chills his pain is well-controlled. He is tolerating his diet. His urine output is excellent.  Exam: Mucus-like drainage around the drain site. No redness. Wound was clean. Ostomy functioning well. Clinical Data: 41- year-old male with a history of perforated  diverticulitis status post colectomy, and colostomy complicated by  intraperitoneal abscess versus leak. A percutaneous drainage  catheter was placed previously. The patient is presenting today  for persistent leakage around the catheter insertion site  concerning for tube displacement. The last CT evaluation was 2  weeks ago on 02/09/2012. Repeat CT scan is performed to evaluate  the degree of residual abscess cavity.  CT ABDOMEN AND PELVIS WITHOUT CONTRAST  Technique: Multidetector CT imaging of the abdomen and pelvis was  performed following the standard protocol without intravenous  contrast.  Comparison: Most recent prior CT scan 02/09/2012, most recent prior  fluoroscopic abscess catheter manipulation and exchange 02/03/2012.  Findings:  Lower Chest: Persistent but decreasing left layering pleural  effusion with associated passive atelectasis of the left lower  lobe. There is some mild chronic atelectasis versus scarring in  the right base. Otherwise, the lung bases are clear. Heart is  within normal limits for size. No pericardial effusion. Distal  thoracic esophagus is unremarkable.  Abdomen: Interval removal of the more inferiorly positioned  percutaneous peritoneal drainage catheter. The more cephalad  drainage catheter remains in good position within the air and fluid  cavity. However, the tube does appear to have been pulled back  since the placement images and several of the side holes can be  visualized along the soft tissue tract to the level of the skin.  The  residual left upper quadrant fluid collection is similar to  slightly larger than previously seen on 02/09/2012. The fluid  collection now measures approximately 7.4 x 4.9 cm compared to 6.4  x 3.7 cm on the prior study. The inferior extent of the collection  appears unchanged. There is persistent gas layering anti  dependently within the cephalad margin of the collection. Surgical  changes of partial colectomy with Hartmann's pouch. The fluid  collection does abut the Hartmann's pouch in the left hemiabdomen.  Diverting colostomy in the left lower quadrant without evidence of  peristomal hernia. Open midline abdominal incision with intact  peritoneum. There is a small amount of air within the bladder.  Otherwise, unremarkable noncontrasted CT appearance of the spleen,  kidneys, liver, gallbladder, pancreas and adrenal glands.  Bones: No acute fracture or aggressive appearing lytic or blastic  osseous lesion.  IMPRESSION:  1. The left hemi abdominal fluid collection abutting the stable  margin of the Hartmann's pouch is similar to slightly enlarged  compared to the prior study.  2. Interval removal of the more inferiorly positioned percutaneous  drainage catheter. The more superiorly positioned drainage  catheter remains within the abscess cavity, however the side holes  along the catheter tubing have been pulled back and are now  positioned at the skin. This may explain the patient's drainage  around the tube insertion site.  Original Report Authenticated By: Malachy Moan, M.D.  Impression:status post sigmoid colectomy for obstruction secondary to perforated sigmoid diverticulitis collocated by her acute respiratory failure, renal failure, abscess, multiple percutaneous drains with leakage around drain site  Plan: EAK INS pouch around drain site. Stop antibiotics once her done. Return next week.

## 2012-02-27 NOTE — Patient Instructions (Signed)
appy Eakins pouch to drain site daily.  Stop flushing drain.

## 2012-03-01 ENCOUNTER — Telehealth (INDEPENDENT_AMBULATORY_CARE_PROVIDER_SITE_OTHER): Payer: Self-pay | Admitting: General Surgery

## 2012-03-01 NOTE — Telephone Encounter (Signed)
Pt's wife called in for him to ask about him having a BM through his rectum this morning; he has an ostomy bag.   Reassured her that this is not an uncommon occurrence, is usually just a large ball of mucous and happens infrequently.   She understands and expressed relief.

## 2012-03-06 ENCOUNTER — Inpatient Hospital Stay (HOSPITAL_COMMUNITY)
Admission: AD | Admit: 2012-03-06 | Discharge: 2012-03-16 | DRG: 372 | Disposition: A | Discharge status: Home Health | Payer: Medicaid Other | Source: Ambulatory Visit | Attending: General Surgery | Admitting: General Surgery

## 2012-03-06 ENCOUNTER — Ambulatory Visit (INDEPENDENT_AMBULATORY_CARE_PROVIDER_SITE_OTHER): Payer: Self-pay | Admitting: Surgery

## 2012-03-06 ENCOUNTER — Encounter (HOSPITAL_COMMUNITY): Payer: Self-pay

## 2012-03-06 ENCOUNTER — Encounter (INDEPENDENT_AMBULATORY_CARE_PROVIDER_SITE_OTHER): Payer: Self-pay | Admitting: Surgery

## 2012-03-06 ENCOUNTER — Inpatient Hospital Stay (HOSPITAL_COMMUNITY): Payer: Medicaid Other

## 2012-03-06 VITALS — BP 110/80 | HR 84 | Temp 97.2°F | Resp 18 | Ht 70.0 in | Wt 245.0 lb

## 2012-03-06 DIAGNOSIS — Z87891 Personal history of nicotine dependence: Secondary | ICD-10-CM | POA: Diagnosis not present

## 2012-03-06 DIAGNOSIS — D72829 Elevated white blood cell count, unspecified: Secondary | ICD-10-CM | POA: Diagnosis present

## 2012-03-06 DIAGNOSIS — N390 Urinary tract infection, site not specified: Secondary | ICD-10-CM | POA: Diagnosis present

## 2012-03-06 DIAGNOSIS — T8131XA Disruption of external operation (surgical) wound, not elsewhere classified, initial encounter: Secondary | ICD-10-CM | POA: Diagnosis present

## 2012-03-06 DIAGNOSIS — E669 Obesity, unspecified: Secondary | ICD-10-CM | POA: Diagnosis present

## 2012-03-06 DIAGNOSIS — Z933 Colostomy status: Secondary | ICD-10-CM | POA: Diagnosis not present

## 2012-03-06 DIAGNOSIS — R627 Adult failure to thrive: Secondary | ICD-10-CM | POA: Diagnosis present

## 2012-03-06 DIAGNOSIS — IMO0002 Reserved for concepts with insufficient information to code with codable children: Secondary | ICD-10-CM

## 2012-03-06 DIAGNOSIS — Z79899 Other long term (current) drug therapy: Secondary | ICD-10-CM

## 2012-03-06 DIAGNOSIS — E878 Other disorders of electrolyte and fluid balance, not elsewhere classified: Secondary | ICD-10-CM | POA: Diagnosis present

## 2012-03-06 DIAGNOSIS — K651 Peritoneal abscess: Secondary | ICD-10-CM | POA: Diagnosis present

## 2012-03-06 DIAGNOSIS — R112 Nausea with vomiting, unspecified: Secondary | ICD-10-CM | POA: Diagnosis present

## 2012-03-06 DIAGNOSIS — Y833 Surgical operation with formation of external stoma as the cause of abnormal reaction of the patient, or of later complication, without mention of misadventure at the time of the procedure: Secondary | ICD-10-CM | POA: Diagnosis present

## 2012-03-06 DIAGNOSIS — Z6835 Body mass index (BMI) 35.0-35.9, adult: Secondary | ICD-10-CM | POA: Diagnosis not present

## 2012-03-06 DIAGNOSIS — E46 Unspecified protein-calorie malnutrition: Secondary | ICD-10-CM | POA: Diagnosis present

## 2012-03-06 DIAGNOSIS — E871 Hypo-osmolality and hyponatremia: Secondary | ICD-10-CM | POA: Diagnosis present

## 2012-03-06 DIAGNOSIS — D509 Iron deficiency anemia, unspecified: Secondary | ICD-10-CM | POA: Diagnosis present

## 2012-03-06 LAB — PHOSPHORUS: Phosphorus: 3.6 mg/dL (ref 2.3–4.6)

## 2012-03-06 LAB — CBC WITH DIFFERENTIAL/PLATELET
Basophils Absolute: 0 10*3/uL (ref 0.0–0.1)
Eosinophils Absolute: 0 10*3/uL (ref 0.0–0.7)
HCT: 30 % — ABNORMAL LOW (ref 39.0–52.0)
Lymphs Abs: 1.3 10*3/uL (ref 0.7–4.0)
MCHC: 33 g/dL (ref 30.0–36.0)
MCV: 86.2 fL (ref 78.0–100.0)
Neutro Abs: 11.5 10*3/uL — ABNORMAL HIGH (ref 1.7–7.7)
Platelets: 591 10*3/uL — ABNORMAL HIGH (ref 150–400)
RDW: 14.4 % (ref 11.5–15.5)

## 2012-03-06 LAB — URINE MICROSCOPIC-ADD ON

## 2012-03-06 LAB — COMPREHENSIVE METABOLIC PANEL
BUN: 11 mg/dL (ref 6–23)
CO2: 26 mEq/L (ref 19–32)
Calcium: 8.8 mg/dL (ref 8.4–10.5)
Creatinine, Ser: 0.8 mg/dL (ref 0.50–1.35)
GFR calc Af Amer: >90 mL/min (ref >90)
GFR calc non Af Amer: >90 mL/min (ref >90)
Glucose, Bld: 123 mg/dL — ABNORMAL HIGH (ref 70–99)

## 2012-03-06 LAB — URINALYSIS, ROUTINE W REFLEX MICROSCOPIC
Glucose, UA: NEGATIVE mg/dL
Ketones, ur: NEGATIVE mg/dL
Leukocytes, UA: NEGATIVE
Protein, ur: 30 mg/dL — AB
Urobilinogen, UA: 0.2 mg/dL (ref 0.0–1.0)

## 2012-03-06 LAB — GLUCOSE, CAPILLARY: Glucose-Capillary: 138 mg/dL — ABNORMAL HIGH (ref 70–99)

## 2012-03-06 LAB — MAGNESIUM: Magnesium: 1.9 mg/dL (ref 1.5–2.5)

## 2012-03-06 MED ORDER — DEXTROSE-NACL 5-0.9 % IV SOLN
INTRAVENOUS | Status: DC
  Administered 2012-03-06: 16:00:00 via INTRAVENOUS

## 2012-03-06 MED ORDER — ONDANSETRON HCL 4 MG/2ML IJ SOLN: 4.0000 mg | Freq: Four times a day (QID) | INTRAMUSCULAR | Status: DC | PRN

## 2012-03-06 MED ORDER — PANTOPRAZOLE SODIUM 40 MG IV SOLR
40.0000 mg | Freq: Every day | INTRAVENOUS | Status: DC
  Administered 2012-03-06 – 2012-03-11 (×6): 40 mg via INTRAVENOUS
  Filled 2012-03-06 (×7): qty 40

## 2012-03-06 MED ORDER — INSULIN ASPART 100 UNIT/ML ~~LOC~~ SOLN
0.0000 [IU] | Freq: Three times a day (TID) | SUBCUTANEOUS | Status: DC
  Administered 2012-03-07 (×2): 1 [IU] via SUBCUTANEOUS

## 2012-03-06 MED ORDER — SODIUM CHLORIDE 0.9 % IJ SOLN
10.0000 mL | INTRAMUSCULAR | Status: DC | PRN
  Administered 2012-03-10: 10 mL
  Administered 2012-03-12: 20 mL
  Administered 2012-03-12 – 2012-03-13 (×5): 10 mL
  Administered 2012-03-15 – 2012-03-16 (×2): 20 mL

## 2012-03-06 MED ORDER — PROMETHAZINE HCL 25 MG/ML IJ SOLN
12.5000 mg | Freq: Four times a day (QID) | INTRAMUSCULAR | Status: DC | PRN
  Administered 2012-03-06 – 2012-03-11 (×10): 12.5 mg via INTRAVENOUS
  Administered 2012-03-11: via INTRAVENOUS
  Administered 2012-03-11 – 2012-03-15 (×8): 12.5 mg via INTRAVENOUS
  Filled 2012-03-06 (×20): qty 1

## 2012-03-06 MED ORDER — CLINIMIX E/DEXTROSE (5/20) 5 % IV SOLN
INTRAVENOUS | Status: AC
  Administered 2012-03-06: 18:00:00 via INTRAVENOUS
  Filled 2012-03-06: qty 1000

## 2012-03-06 MED ORDER — HYDROMORPHONE HCL PF 1 MG/ML IJ SOLN
1.0000 mg | INTRAMUSCULAR | Status: DC | PRN
  Administered 2012-03-06 – 2012-03-15 (×51): 1 mg via INTRAVENOUS
  Filled 2012-03-06 (×50): qty 1
  Filled 2012-03-06: qty 2
  Filled 2012-03-06 (×2): qty 1

## 2012-03-06 MED ORDER — PIPERACILLIN-TAZOBACTAM 3.375 G IVPB
3.3750 g | Freq: Three times a day (TID) | INTRAVENOUS | Status: DC
  Administered 2012-03-06 – 2012-03-15 (×27): 3.375 g via INTRAVENOUS
  Filled 2012-03-06 (×28): qty 50

## 2012-03-06 NOTE — Progress Notes (Signed)
CT scan shows that the left sided abscess is larger and extends superiorly from the position of the current drain.  Discussed with Dr. Fredia Sorrow.  He will discuss putting another drain in with the IR MD tomorrow.

## 2012-03-06 NOTE — Patient Instructions (Signed)
Admit to hospital 

## 2012-03-06 NOTE — Progress Notes (Signed)
Patient returns to clinic today. Her last 2 days he developed nausea and vomiting and feels quite poorly. He is very weak. It has significant drainage from around the colostomy site. The wound manager over his left flank percutaneous drain is still draining a fair amount of purulent. Overall, he does not seem to be progressing very well. He is trying eat but still very weak.  Vitals: He is afebrile. Heart rate in the 80s. Blood pressure stable.  General appearance: White male very tired looking.  Abdomen: I took his colostomy down. It is severely stenosed and retracted which is a significant change from previous exam. Midline wound shows significant separation of his fascia with extensive granulation tissue. There is exposed bowel but is a good layer of granulation tissue on it. Overall, things look worse. I was able to pass my small finger through the colostomy site but it was significant stenosis. Left flank drain is still draining foul-smelling material but less.  Impression: Status post sigmoid colectomy and colostomy due to the emergent colonic obstruction complicated by intra-abdominal abscess, acute renal failure, acute pulmonary failure, now with failure to thrive and poor wound healing despite taking enteral nutrition  Plan: I discussed admission with Dr. Corliss Skains at [facility] who is the doctor the week. He'll need admission for IV fluids, PICC line, TNA, IV antibiotics, repeat CT scan without contrast possible further drainage of abdominal collection. He will require wound care and will need to be seen by the wound ostomy care nurse for a severely stenotic colostomy and large at the wound is not healing well. I see no signs of infection. I discussed this with the patient and his wife today.

## 2012-03-06 NOTE — Progress Notes (Signed)
Peripherally Inserted Central Catheter/Midline Placement  The IV Nurse has discussed with the patient and/or persons authorized to consent for the patient, the purpose of this procedure and the potential benefits and risks involved with this procedure.  The benefits include less needle sticks, lab draws from the catheter and patient may be discharged home with the catheter.  Risks include, but not limited to, infection, bleeding, blood clot (thrombus formation), and puncture of an artery; nerve damage and irregular heat beat.  Alternatives to this procedure were also discussed.  PICC/Midline Placement Documentation        [name] 03/06/2012, 3:50 PM

## 2012-03-06 NOTE — H&P (Signed)
Agree with above.  Discussed with Dr. Luisa Hart.  Will proceed with his treatment plan.  Wilmon Arms. Corliss Skains, MD, [facility]  03/06/2012 2:08 PM

## 2012-03-06 NOTE — Progress Notes (Signed)
PARENTERAL NUTRITION CONSULT NOTE - INITIAL  Pharmacy Consult for TNA Indication: Failure to thrive despite taking enteral nutrition  Allergies  Allergen Reactions  . Contrast Media (Iodinated Diagnostic Agents) Other (See Comments)    Kidney failure    Patient Measurements: Height: 5\' 10"  (177.8 cm) Weight: 245 lb (111.131 kg) IBW/kg (Calculated) : 73  Adjusted Body Weight: 88 kg  Vital Signs: Temp: 97.9 F (36.6 C) (11/19 1140) BP: 132/83 mmHg (11/19 1140) Pulse Rate: 114  (11/19 1140) Intake/Output from previous day:   Intake/Output from this shift:    Labs: No results found for this basename: WBC:3,HGB:3,HCT:3,PLT:3,APTT:3,INR:3 in the last 72 hours  No results found for this basename: NA:3,K:3,CL:3,CO2:3,GLUCOSE:3,BUN:3,CREATININE:3,LABCREA:3,CREAT24HRUR:3,CALCIUM:3,MG:3,PHOS:3,PROT:3,ALBUMIN:3,AST:3,ALT:3,ALKPHOS:3,BILITOT:3,BILIDIR:3,IBILI:3,PREALBUMIN:3,TRIG:3,CHOLHDL:3,CHOL:3 in the last 72 hours Estimated Creatinine Clearance: 33.6 ml/min (by C-G formula based on Cr of 3.65).   No results found for this basename: GLUCAP:3 in the last 72 hours  Medical History: Past Medical History  Diagnosis Date  . Urinary anastomotic stricture undiagnosed     hard to be cathed; urology did the last time  . Diverticulitis   . Colon obstruction 01/17/2012  . Obesity, Class III, BMI 40-49.9 (morbid obesity) 01/17/2012    Medications:  Prescriptions prior to admission  Medication Sig Dispense Refill  . ciprofloxacin (CIPRO) 500 MG tablet Take 1 tablet (500 mg total) by mouth 2 (two) times daily.  28 tablet  0  . HYDROcodone-acetaminophen (NORCO) 5-325 MG per tablet Take 1 tablet by mouth every 6 (six) hours as needed for pain.  30 tablet  0  . metroNIDAZOLE (FLAGYL) 250 MG tablet Take 2 tablets (500 mg total) by mouth every 12 (twelve) hours.  28 tablet  0   Scheduled:    . pantoprazole (PROTONIX) IV  40 mg Intravenous QHS  . piperacillin-tazobactam (ZOSYN)  IV  3.375 g  Intravenous Q8H   Infusions:    . dextrose 5 % and 0.9% NaCl      Insulin Requirements in the past 24 hours:  0 currently  Current Nutrition:  D5 NS @ 100 ml/hr NPO  Assessment: 41 yo M s/p sigmoid colectomy and colostomy due to emergent colonic obstruction with multiple complications. Patient now with failure to thrive and poor wound healing despite taking enteral nutrition. Patient admitted for IV fluids, PICC line, TNA, IV abx, and repeat CT scan.  Nutritional Goals:  TPN Estimated Nutritional Needs from previous admission in October: Kcal:1900-2250, Protein:90-115g, Fluid:1.9-2.2L   WILL FOLLOW UP UPDATED NUTRITIONAL NEEDS PER RD  Clinimix E5/20 at goal rate of 80 ml/hr will provide 96 g protein, 1689 kcal TTSS, 2169 kcal MWF, and an average of 1896 kcal daily. Will follow up on updated nutritional needs per RD.   Plan:  1. Start Clinimix E5/20 @ 40 ml/hr tonight at 1800 if PICC line placed, if not place will need to start tomorrow.  2. IV Fat Emulsion 20% on MWF d/t ongoing shortage 3. Multivitamins and trace elements in TNA MWF d/t ongoing shortage 4. CBGs every 8 hours with sensitive SSI  5. TNA labs at baseline 6. TNA lab panel Mondays and Thursdays 7. Follow-up labs  [employer], Pharm.D. Clinical Oncology Pharmacist  Pager # [phone]  03/06/2012,1:14 PM

## 2012-03-06 NOTE — H&P (Signed)
[name] is an 41 y.o. male.   Chief Complaint: Nausea, vomiting, FTT HPI: 41 yr old male who had a prolonged hospital stay due to perforated colon with complicated course due to abscesses and ileus.  He was discharged on 10/27.  He presented to our office today with 2 days of nausea and vomiting and feeling quite poor and weak. He has had significant drainage from around the colostomy site. The Eakins pouch over his left flank percutaneous drain is still draining a fair amount of purulent material. Overall, he does was not progressing very well therefore he was sent to the hospital for admission due to FTT.   Past Medical History  Diagnosis Date  . Urinary anastomotic stricture undiagnosed     hard to be cathed; urology did the last time  . Diverticulitis   . Colon obstruction 01/17/2012  . Obesity, Class III, BMI 40-49.9 (morbid obesity) 01/17/2012    Past Surgical History  Procedure Date  . Partial colectomy 01/17/2012    Procedure: PARTIAL COLECTOMY;  Surgeon: Clovis Pu. Cornett, MD;  Location: [facility] ORS;  Service: General;  Laterality: N/A;  . 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 Cornett, MD;  Location: [facility] ORS;  Service: General;  Laterality: N/A;    History reviewed. No pertinent family history. Social History:  reports that he has quit smoking. His smoking use included Cigarettes. He started smoking about 7 weeks ago. He has a 40 pack-year smoking history. He has never used smokeless tobacco. He reports that he does not drink alcohol or use illicit drugs.  Allergies:  Allergies  Allergen Reactions  . Contrast Media (Iodinated Diagnostic Agents) Other (See Comments)    Kidney failure    Medications Prior to Admission  Medication Sig Dispense Refill  . ciprofloxacin (CIPRO) 500 MG tablet Take 1 tablet (500 mg total) by mouth 2 (two) times daily.  28 tablet  0  . HYDROcodone-acetaminophen (NORCO) 5-325 MG per tablet Take 1 tablet by mouth every 6  (six) hours as needed for pain.  30 tablet  0  . metroNIDAZOLE (FLAGYL) 250 MG tablet Take 2 tablets (500 mg total) by mouth every 12 (twelve) hours.  28 tablet  0    No results found for this or any previous visit (from the past 48 hour(s)). No results found.  Review of Systems  Constitutional: Positive for weight loss and malaise/fatigue. Negative for fever and chills.  HENT: Negative.   Eyes: Negative.   Respiratory: Negative.   Cardiovascular: Negative.   Gastrointestinal: Positive for nausea and abdominal pain. Negative for vomiting.  Genitourinary: Negative.   Musculoskeletal: Positive for back pain and joint pain.  Skin: Negative.   Neurological: Positive for weakness. Negative for dizziness, tingling and tremors.  Endo/Heme/Allergies: Negative.   Psychiatric/Behavioral: Negative.     Blood pressure 132/83, pulse 114, temperature 97.9 F (36.6 C), resp. rate 20, height 5\' 10"  (1.778 m), weight 245 lb (111.131 kg), SpO2 100.00%. Physical Exam  Constitutional: He is oriented to person, place, and time. He appears well-developed. No distress.       Weak appearing  HENT:  Head: Normocephalic and atraumatic.  Eyes: Conjunctivae normal are normal. Pupils are equal, round, and reactive to light.  Neck: Normal range of motion. Neck supple.  Cardiovascular: Normal rate and regular rhythm.   Respiratory: Effort normal and breath sounds normal.  GI: Soft. He exhibits no distension. There is no tenderness.       Per Dr.  Cornett's note: Midline wound shows significant separation of his fascia with extensive granulation tissue. There is exposed bowel but is a good layer of granulation tissue on it  Per Dr. Rosezena Sensor note: colostomy is severely stenosed and retracted which is a significant change from previous exam  Genitourinary:       Deferred   Musculoskeletal: He exhibits edema.  Neurological: He is alert and oriented to person, place, and time.  Skin: Skin is warm and dry.        Wound per abdominal exam note  Psychiatric: He has a normal mood and affect. His behavior is normal.     Assessment/Plan 1.  Failure to thrive/wound breakdown: the patient will be admitted for IV fluids, PICC line, TNA, IV antibiotics, repeat CT scan without contrast and possible further drainage of abdominal collection. He will require wound care and will need to be seen by the wound ostomy care nurse for a severely stenotic colostomy and large abdominal wound that is not healing well.    [name] 03/06/2012, 12:49 PM

## 2012-03-06 NOTE — Consult Note (Signed)
WOC consult Note Reason for Consult:Placement of Wound V.A.C per Dr. Fatima Sanger request Wound type:Non-healing surgical wound Pressure Ulcer POA: No Measurement:26cm x 8cm x 3.5cm  Wound ZOX:WRUE pink, smooth wound bed. Center area is observed to be bulging with faint ecchymosis, no visible bowel Drainage (amount, consistency, odor) Scant light yellow on old dressing Periwound:intact. Patient shaved prior to V.A.C. drape application. Dressing procedure/placement/frequency:NPWT initiated per Dr. Fatima Sanger request. One piece of  Mepitel silicone dressing placed into wound bed, followed by 1 piece of white foam cut into two pieces to maximize fit.  These layers were covered with two pieces of black Granufoam prior to drape application.  A seal was immediately achieved using contiuous pressure and the procedure was tolerated well by the patient.   Patient's wife was in attendance for the procedure.  As the dressing is placed so late this evening, we will continue with a M-W-F npwt dressing change schedule unless otherwise directedy CCS.  Noted is Dr. Jamse Mead thought regarding results from the CT scan performed earlier this evening and the possibility of a change in drain placement tomorrow. The WOC Team will follow along with you for stoma management/ostomy care and for oversight of the npwt care provided by nursing staff. Please contact us if needed in-between visits. Thanks, Ladona Mow, MSN, RN, [facility], CWOCN [phone])

## 2012-03-06 NOTE — Progress Notes (Signed)
INITIAL ADULT NUTRITION ASSESSMENT Date: 03/06/2012   Time: 3:38 PM Reason for Assessment: Consult for TPN  INTERVENTION: TPN per pharmacy. Recommend transition to enteral nutrition when appropriate. Will monitor.   Pt meets criteria for severe malnutrition of chronic illness AEB 10.9% weight loss in the past 2 months with likely <75% estimated energy intake in the past month.   ASSESSMENT: Male 41 y.o.  Dx: Nausea, vomiting, failure to thrive  Food/Nutrition Related Hx: Pt known to RD from previous admissions two months ago. Pt with history of being on TPN and TF during previous admission however pt was not discharged on any nutrition support. Attempted to meet with pt three times today, however each time pt was getting PICC line placed. Pt's weight down 30 pounds in the past 2 months. Pt admitted for 2 days of nausea/vomiting with failure to thrive. Nursing reports pt with greenish output from colostomy and left abdominal drain. Refeeding labs ordered - pt with low potassium however phosphorus and magnesium WNL. Pt with severely stenotic colostomy with poor healing midline wound per MD.   Hx:  Past Medical History  Diagnosis Date  . Urinary anastomotic stricture undiagnosed     hard to be cathed; urology did the last time  . Diverticulitis   . Colon obstruction 01/17/2012  . Obesity, Class III, BMI 40-49.9 (morbid obesity) 01/17/2012   Related Meds:  Scheduled Meds:   . insulin aspart  0-9 Units Subcutaneous Q8H  . pantoprazole (PROTONIX) IV  40 mg Intravenous QHS  . piperacillin-tazobactam (ZOSYN)  IV  3.375 g Intravenous Q8H   Continuous Infusions:   . dextrose 5 % and 0.9% NaCl    . TPN (CLINIMIX) +/- additives     PRN Meds:.HYDROmorphone (DILAUDID) injection, ondansetron, promethazine  Ht: 5\' 10"  (177.8 cm)  Wt: 245 lb (111.131 kg)  Ideal Wt: 166 lb % Ideal Wt: 147  Usual Wt: 275 lb in September 2013 % Usual Wt: 89   Body mass index is 35.15 kg/(m^2). Class II  obesity   Labs: CMP     Component Value Date/Time   NA 131* 03/06/2012 1315   K 2.9* 03/06/2012 1315   CL 92* 03/06/2012 1315   CO2 26 03/06/2012 1315   GLUCOSE 123* 03/06/2012 1315   BUN 11 03/06/2012 1315   CREATININE 0.80 03/06/2012 1315   CALCIUM 8.8 03/06/2012 1315   PROT 7.3 03/06/2012 1315   ALBUMIN 2.2* 03/06/2012 1315   AST 29 03/06/2012 1315   ALT 34 03/06/2012 1315   ALKPHOS 113 03/06/2012 1315   BILITOT 0.4 03/06/2012 1315   GFRNONAA >90 03/06/2012 1315   GFRAA >90 03/06/2012 1315    Phosphorus  Date/Time Value Range Status  03/06/2012  1:15 PM 3.6  2.3 - 4.6 mg/dL Final  96/07/5407  8:11 AM 4.6  2.3 - 4.6 mg/dL Final  91/47/8295  6:21 AM 5.1* 2.3 - 4.6 mg/dL Final    Magnesium  Date/Time Value Range Status  03/06/2012  1:15 PM 1.9  1.5 - 2.5 mg/dL Final  30/86/5784  6:96 AM 1.9  1.5 - 2.5 mg/dL Final  29/52/8413  2:44 AM 2.3  1.5 - 2.5 mg/dL Final   CBG (last 3)  No results found for this basename: GLUCAP:3 in the last 72 hours  No intake or output data in the 24 hours ending 03/06/12 1547  Last BM - 11/19 per RN  Diet Order: NPO   IVF:    dextrose 5 % and 0.9% NaCl  TPN (CLINIMIX) +/-  additives    Estimated Nutritional Needs:   Kcal:2000-2300 Protein:115-135g Fluid:2-2.3L  NUTRITION DIAGNOSIS: -Inadequate oral intake (NI-2.1).  Status: Ongoing  RELATED TO: inability to eat  AS EVIDENCE BY: NPO  MONITORING/EVALUATION(Goals): TPN to meet >90% of estimated nutritional needs.   EDUCATION NEEDS: -No education needs identified at this time   Dietitian #: [phone]  DOCUMENTATION CODES Per approved criteria  -Severe malnutrition in the context of chronic illness -Obesity Unspecified    [name] 03/06/2012, 3:38 PM

## 2012-03-07 ENCOUNTER — Inpatient Hospital Stay (HOSPITAL_COMMUNITY): Payer: Medicaid Other

## 2012-03-07 LAB — COMPREHENSIVE METABOLIC PANEL
ALT: 24 U/L (ref 0–53)
Albumin: 2 g/dL — ABNORMAL LOW (ref 3.5–5.2)
Alkaline Phosphatase: 90 U/L (ref 39–117)
Calcium: 8.4 mg/dL (ref 8.4–10.5)
Potassium: 2.6 mEq/L — CL (ref 3.5–5.1)
Sodium: 132 mEq/L — ABNORMAL LOW (ref 135–145)
Total Protein: 6.3 g/dL (ref 6.0–8.3)

## 2012-03-07 LAB — DIFFERENTIAL
Basophils Relative: 1 % (ref 0–1)
Eosinophils Absolute: 0.3 10*3/uL (ref 0.0–0.7)
Lymphocytes Relative: 8 % — ABNORMAL LOW (ref 12–46)
Monocytes Absolute: 1.3 10*3/uL — ABNORMAL HIGH (ref 0.1–1.0)
Neutrophils Relative %: 76 % (ref 43–77)

## 2012-03-07 LAB — CBC
Hemoglobin: 8.2 g/dL — ABNORMAL LOW (ref 13.0–17.0)
MCHC: 32.2 g/dL (ref 30.0–36.0)
Platelets: 514 10*3/uL — ABNORMAL HIGH (ref 150–400)
RBC: 2.92 MIL/uL — ABNORMAL LOW (ref 4.22–5.81)

## 2012-03-07 LAB — PREALBUMIN: Prealbumin: 4.9 mg/dL — ABNORMAL LOW (ref 17.0–34.0)

## 2012-03-07 LAB — PHOSPHORUS: Phosphorus: 3 mg/dL (ref 2.3–4.6)

## 2012-03-07 LAB — TRIGLYCERIDES: Triglycerides: 234 mg/dL — ABNORMAL HIGH (ref <150)

## 2012-03-07 LAB — PROTIME-INR: Prothrombin Time: 17.6 seconds — ABNORMAL HIGH (ref 11.6–15.2)

## 2012-03-07 LAB — CHOLESTEROL, TOTAL: Cholesterol: 143 mg/dL (ref 0–200)

## 2012-03-07 MED ORDER — IOHEXOL 300 MG/ML  SOLN: 30.0000 mL | Freq: Once | INTRAMUSCULAR | Status: AC | PRN

## 2012-03-07 MED ORDER — TRACE MINERALS CR-CU-F-FE-I-MN-MO-SE-ZN IV SOLN
INTRAVENOUS | Status: AC
  Administered 2012-03-07: 18:00:00 via INTRAVENOUS
  Filled 2012-03-07: qty 1000

## 2012-03-07 MED ORDER — LIDOCAINE HCL 1 % IJ SOLN
INTRAMUSCULAR | Status: AC
  Filled 2012-03-07: qty 20

## 2012-03-07 MED ORDER — FAT EMULSION 20 % IV EMUL
250.0000 mL | INTRAVENOUS | Status: AC
  Administered 2012-03-07: 250 mL via INTRAVENOUS
  Filled 2012-03-07: qty 250

## 2012-03-07 MED ORDER — MIDAZOLAM HCL 2 MG/2ML IJ SOLN
INTRAMUSCULAR | Status: AC | PRN
  Administered 2012-03-07: 2 mg via INTRAVENOUS

## 2012-03-07 MED ORDER — POTASSIUM CHLORIDE 10 MEQ/50ML IV SOLN
10.0000 meq | INTRAVENOUS | Status: AC
  Administered 2012-03-07 (×5): 10 meq via INTRAVENOUS
  Filled 2012-03-07 (×5): qty 50

## 2012-03-07 MED ORDER — KCL IN DEXTROSE-NACL 20-5-0.9 MEQ/L-%-% IV SOLN
INTRAVENOUS | Status: AC
  Administered 2012-03-07 (×2): via INTRAVENOUS
  Filled 2012-03-07 (×4): qty 1000

## 2012-03-07 MED ORDER — FENTANYL CITRATE 0.05 MG/ML IJ SOLN
INTRAMUSCULAR | Status: AC | PRN
  Administered 2012-03-07 (×3): 100 ug via INTRAVENOUS

## 2012-03-07 NOTE — Progress Notes (Signed)
Subjective: Generally no c/o this morning IR PA in with patient, plan is for additional drain placement in LLQ of abdomen today. Patient describes abdominal pain as throbbing/stabbing and intermittent. Current abdominal drain has frank pus in bag, Ostomy is stenotic,but has output.  Objective: Vital signs in last 24 hours: Temp:  [97.2 F (36.2 C)-100.2 F (37.9 C)] 97.8 F (36.6 C) (11/20 0622) Pulse Rate:  [84-114] 102  (11/20 0622) Resp:  [16-20] 16  (11/20 0622) BP: (105-132)/(68-83) 105/68 mmHg (11/20 0622) SpO2:  [98 %-100 %] 99 % (11/20 0622) Weight:  [245 lb (111.131 kg)] 245 lb (111.131 kg) (11/19 1140) Last BM Date: 03/06/12  Intake/Output from previous day: 11/19 0701 - 11/20 0700 In: -  Out: 1350 [Urine:1350] Intake/Output this shift:    General appearance: alert, cooperative, appears stated age and no distress Chest: CTA Cardiac: RRR Abdomen: ostomy stenotic, has output (none recorded on I&O) abdominal drain has frank pus in bag. Wound vac is in place and functioning. Minimal BS. Extremities: warm to touch, + pulses, no edema or tenderness. Labs: Wbc have trended down slightly, Hypokalemic (recvng repletion), Hyponatremic, Hypochloremic. BUN stable, Creatine slightly increased.  Lab Results:   Basename 03/07/12 0530 03/06/12 1315  WBC 11.2* 14.6*  HGB 8.2* 9.9*  HCT 25.5* 30.0*  PLT 514* 591*   BMET  Basename 03/07/12 0530 03/06/12 1315  NA 132* 131*  K 2.6* 2.9*  CL 94* 92*  CO2 27 26  GLUCOSE 157* 123*  BUN 11 11  CREATININE 0.81 0.80  CALCIUM 8.4 8.8   PT/INR No results found for this basename: LABPROT:2,INR:2 in the last 72 hours ABG No results found for this basename: PHART:2,PCO2:2,PO2:2,HCO3:2 in the last 72 hours  Studies/Results: Ct Abdomen Pelvis Wo Contrast  03/06/2012  *RADIOLOGY REPORT*  Clinical Data: Colonic perforation complicated by prior abscess and status post placement of prior percutaneous drains in a left-sided  peritoneal abscess.  A single percutaneous drain remains present. There is drainage around a colostomy site and around the indwelling percutaneous drain.  CT ABDOMEN AND PELVIS WITHOUT CONTRAST  Technique:  Multidetector CT imaging of the abdomen and pelvis was performed following the standard protocol without intravenous contrast.  Comparison: 02/22/2012  Findings: After exchange and upsizing of a previously placed left sided percutaneous abscess drainage catheter, the catheter is visualized and in good position within the inferior aspect of a residual left lateral abdominal abscess.  The irregular and elongated collection appears larger by imaging with maximal transverse diameter of approximately 8.5 cm and maximal height of nearly 17 cm.  There is some air in the irregularly shaped collection.  Small loculations of fluid and air in the left upper quadrant likely all communicate with the main abscess cavity. Given current positioning of the preexisting percutaneous drain in the inferior aspect of the collection, the patient may benefit from additional drainage catheter placement in the more superior aspect of the collection.  No additional abscess collections are identified.  There is no evidence of bowel perforation or obstruction.  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  No pelvic abnormalities.  IMPRESSION: Enlargement of irregular shaped abscess in the left lateral abdomen that extends all the way up into the left upper quadrant adjacent to the stomach.  Inferiorly placed percutaneous drain appears well positioned.  Given enlargement since prior CT, the patient may benefit from placement of a second drain in the more superior aspect of the abscess.   Original Report Authenticated By: Irish Lack, M.D.  Anti-infectives: Anti-infectives     Start     Dose/Rate Route Frequency Ordered Stop   03/06/12 1500   piperacillin-tazobactam (ZOSYN) IVPB 3.375 g     Comments: Pharmacy to check dosing        3.375 g 12.5 mL/hr over 240 Minutes Intravenous 3 times per day 03/06/12 1145            Assessment/Plan:  Patient Active Problem List  Diagnosis  . UTI (lower urinary tract infection)  . Constipation  . Diverticulitis of colon without hemorrhage  . Abdominal pain  . Colon obstruction  . Obesity, Class III, BMI 40-49.9 (morbid obesity)  . Acute respiratory failure with hypoxia  . Acute pulmonary edema  . Encephalopathy acute  . Tobacco abuse  . Intra-abdominal abscess, diverticular s/p surgical drainage  . Hypertriglyceridemia   s/p * No surgery found * 1. Failure to thrive/wound breakdown:  IV fluids, PICC line, TNA, IV antibiotics. He will require wound care and will need to be seen by the wound ostomy care nurse for a severely stenotic colostomy and large abdominal wound that is not healing well.  Wound vac per wound care team (chng M,W,F) 2. IR for drain placement,3. PT/OT 3. Encourage IS/OOB 4. ? Begin po feeds (will discuss with Dr. Corliss Skains). 5. Follow clinical picture   LOS: 1 day    Golda Acre [facility] Pager # [phone]  03/07/2012

## 2012-03-07 NOTE — Progress Notes (Signed)
CRITICAL VALUE ALERT  Critical value received:  Potassium level  2.6  Date of notification:  03/07/12  Time of notification:  0645  Critical value read back:yes  Nurse who received alert:  MDeBrew,RN  MD notified (1st page): [place] Time of first page:  [phone]  MD notified (2nd page):  Time of second page:  Responding MD:  Abbey Chatters  Time MD responded:  [phone]

## 2012-03-07 NOTE — Progress Notes (Signed)
[facility]  Patient Status: Active (receiving services up to time of hospitalization)  [facility] is providing the following services: RN  If patient discharges after hours, please call [phone].   Lanae Crumbly 03/07/2012, 10:26 AM

## 2012-03-07 NOTE — Progress Notes (Signed)
PARENTERAL NUTRITION CONSULT NOTE - INITIAL  Pharmacy Consult for TNA Indication: Failure to thrive despite taking enteral nutrition  Allergies  Allergen Reactions  . Contrast Media (Iodinated Diagnostic Agents) Other (See Comments)    Kidney failure    Patient Measurements: Height: 5\' 10"  (177.8 cm) Weight: 245 lb (111.131 kg) IBW/kg (Calculated) : 73  Adjusted Body Weight: 88 kg  Vital Signs: Temp: 97.8 F (36.6 C) (11/20 0622) Temp src: Oral (11/20 0622) BP: 105/68 mmHg (11/20 0622) Pulse Rate: 102  (11/20 0622) Intake/Output from previous day: 11/19 0701 - 11/20 0700 In: -  Out: 1350 [Urine:1350] Intake/Output from this shift:    Labs:  [facility] 03/07/12 0530 03/06/12 1315  WBC 11.2* 14.6*  HGB 8.2* 9.9*  HCT 25.5* 30.0*  PLT 514* 591*  APTT -- --  INR -- --     Basename 03/07/12 0530 03/06/12 1315  NA 132* 131*  K 2.6* 2.9*  CL 94* 92*  CO2 27 26  GLUCOSE 157* 123*  BUN 11 11  CREATININE 0.81 0.80  LABCREA -- --  CREAT24HRUR -- --  CALCIUM 8.4 8.8  MG 2.0 1.9  PHOS 3.0 3.6  PROT 6.3 7.3  ALBUMIN 2.0* 2.2*  AST 20 29  ALT 24 34  ALKPHOS 90 113  BILITOT 0.2* 0.4  BILIDIR -- --  IBILI -- --  PREALBUMIN -- --  TRIG 234* --  CHOLHDL -- --  CHOL 143 --   Estimated Creatinine Clearance: 151.2 ml/min (by C-G formula based on Cr of 0.81).    Basename 03/07/12 0750 03/06/12 2349 03/06/12 1725  GLUCAP 140* 138* 120*    Medical History: Past Medical History  Diagnosis Date  . Urinary anastomotic stricture undiagnosed     hard to be cathed; urology did the last time  . Diverticulitis   . Colon obstruction 01/17/2012  . Obesity, Class III, BMI 40-49.9 (morbid obesity) 01/17/2012    Medications:  Prescriptions prior to admission  Medication Sig Dispense Refill  . HYDROcodone-acetaminophen (NORCO) 5-325 MG per tablet Take 1 tablet by mouth every 6 (six) hours as needed for pain.  30 tablet  0  . ciprofloxacin (CIPRO) 500 MG tablet Take 1  tablet (500 mg total) by mouth 2 (two) times daily.  28 tablet  0  . metroNIDAZOLE (FLAGYL) 250 MG tablet Take 2 tablets (500 mg total) by mouth every 12 (twelve) hours.  28 tablet  0   Scheduled:     . insulin aspart  0-9 Units Subcutaneous Q8H  . pantoprazole (PROTONIX) IV  40 mg Intravenous QHS  . piperacillin-tazobactam (ZOSYN)  IV  3.375 g Intravenous Q8H  . potassium chloride  10 mEq Intravenous Q1 Hr x 5   Infusions:     . dextrose 5 % and 0.9 % NaCl with KCl 20 mEq/L 100 mL/hr at 03/07/12 0817  . TPN (CLINIMIX) +/- additives 40 mL/hr at 03/06/12 1801  . [DISCONTINUED] dextrose 5 % and 0.9% NaCl 100 mL/hr at 03/06/12 1627    Insulin Requirements in the past 24 hours:  2 units SSI  Current Nutrition:  D5 NS + K 11mEq/L @ 100 ml/hr NPO  Assessment: 41 yo M s/p sigmoid colectomy and colostomy due to emergent colonic obstruction with multiple complications.   Patient has a history of being on TPN and TF during previous admissions however patient was not discharged on any nutritional support after last admission.  Patient admitted with 2 days of N/V, failure to thrive and poor wound healing.  Weight lose of 30 pounds in past 2 months.  CT scan 11/19 shows Left abdominal abscess with plan for CT guided drainage of more superiod left abdominal abscess on 11/20.  TNA per pharmacy started 11/19  Nutritional Goals:  Per RD recommendations 11/19 Kcal:2000-2300  Protein:115-135g  Fluid:2-2.3L  Clinimix E5/20 at goal rate of 95 ml/hr will provide 114 g protein and an average of 2212 Kcal/day (2486 Kcal on lipid days and 2006 Kcals on non-lipid days)  Labs Electrolytes: Potassium Low (2.6) - replacement ordered by MD; Na slightly low but cannot adjust in premixed TNA; other electrolytes WNL Renal: WNL Liver: WNL Lipids: Total Cholesterol WNL/TG elevated (234) Pre-Albumin - f/u 11/21 value CBGs < 150   Plan:  1. Continue Clinimix E 5/20 @ 40 ml/hr - Will not advance until  potassium level normalizes.   2. IV Fat Emulsion 20% on MWF d/t ongoing shortage 3. Multivitamins and trace elements in TNA MWF d/t ongoing shortage 4. CBGs every 8 hours with sensitive SSI  5. Routine TNA lab panel Mondays and Thursdays 6. Monitor elevated TG   [name], Loma Messing PharmD Pager #: [phone] 10:40 AM 03/07/2012

## 2012-03-07 NOTE — Care Management Note (Signed)
    Page 1 of 1   03/07/2012     10:52:29 AM   CARE MANAGEMENT NOTE 03/07/2012  Patient:  [name], [name]   Account Number:  [id number]  Date Initiated:  03/07/2012  Documentation initiated by:  Lorenda Ishihara  Subjective/Objective Assessment:   41 yo male admitted with wound drainage, abd abscess enlargement, FTT. PTA lived at home with spouse/family.     Action/Plan:   Anticipated DC Date:  03/12/2012   Anticipated DC Plan:  HOME W HOME HEALTH SERVICES      DC Planning Services  CM consult      [facility] Choice  Resumption Of Svcs/PTA Provider   Choice offered to / List presented to:          [facility] arranged  HH-1 RN      [facility] agency  [facility]   Status of service:  In process, will continue to follow Medicare Important Message given?   (If response is "NO", the following Medicare IM given date fields will be blank) Date Medicare IM given:   Date Additional Medicare IM given:    Discharge Disposition:    Per UR Regulation:  Reviewed for med. necessity/level of care/duration of stay  If discussed at Long Length of Stay Meetings, dates discussed:    Comments:

## 2012-03-07 NOTE — Progress Notes (Signed)
Patient ID: [name], male   DOB: 1971/03/23, 41 y.o.   MRN: [id number] Request received for CT guided placement of left abdominal abscess drain in pt with prior hx of perforated colon/diverticulitis with partial colectomy/colostomy and persistent, enlarging left abdominal abscesses. Pt has had prior drains placed by our service and currently has 54F drain in LLQ abscess. Imaging studies were reviewed by Dr. Grace Isaac. Additional PMH as below. Exam: pt awake/alert; chest- dim BS bases; heart-tachy but reg rhythm; abd- obese, soft, +BS, tender left abd region with ostomy bag covering 54F drain insertion site and liquid green stool in bag.    Filed Vitals:   03/06/12 1140 03/06/12 1845 03/06/12 2209 03/07/12 0622  BP: 132/83 119/69 109/76 105/68  Pulse: 114 101 104 102  Temp: 97.9 F (36.6 C) 98.5 F (36.9 C) 100.2 F (37.9 C) 97.8 F (36.6 C)  TempSrc:  Oral Oral Oral  Resp: 20 16 16 16   Height: 5\' 10"  (1.778 m)     Weight: 245 lb (111.131 kg)     SpO2: 100% 98% 98% 99%   Past Medical History  Diagnosis Date  . Urinary anastomotic stricture undiagnosed     hard to be cathed; urology did the last time  . Diverticulitis   . Colon obstruction 01/17/2012  . Obesity, Class III, BMI 40-49.9 (morbid obesity) 01/17/2012   Past Surgical History  Procedure Date  . Partial colectomy 01/17/2012    Procedure: PARTIAL COLECTOMY;  Surgeon: Clovis Pu. Cornett, MD;  Location: [facility];  Service: General;  Laterality: N/A;  . 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 Cornett, MD;  Location: [facility];  Service: General;  Laterality: N/A;   Ct Abdomen Pelvis Wo Contrast  03/06/2012  *RADIOLOGY REPORT*  Clinical Data: Colonic perforation complicated by prior abscess and status post placement of prior percutaneous drains in a left-sided peritoneal abscess.  A single percutaneous drain remains present. There is drainage around a colostomy site and around the indwelling percutaneous drain.   CT ABDOMEN AND PELVIS WITHOUT CONTRAST  Technique:  Multidetector CT imaging of the abdomen and pelvis was performed following the standard protocol without intravenous contrast.  Comparison: 02/22/2012  Findings: After exchange and upsizing of a previously placed left sided percutaneous abscess drainage catheter, the catheter is visualized and in good position within the inferior aspect of a residual left lateral abdominal abscess.  The irregular and elongated collection appears larger by imaging with maximal transverse diameter of approximately 8.5 cm and maximal height of nearly 17 cm.  There is some air in the irregularly shaped collection.  Small loculations of fluid and air in the left upper quadrant likely all communicate with the main abscess cavity. Given current positioning of the preexisting percutaneous drain in the inferior aspect of the collection, the patient may benefit from additional drainage catheter placement in the more superior aspect of the collection.  No additional abscess collections are identified.  There is no evidence of bowel perforation or obstruction.  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  No pelvic abnormalities.  IMPRESSION: Enlargement of irregular shaped abscess in the left lateral abdomen that extends all the way up into the left upper quadrant adjacent to the stomach.  Inferiorly placed percutaneous drain appears well positioned.  Given enlargement since prior CT, the patient may benefit from placement of a second drain in the more superior aspect of the abscess.   Original Report Authenticated By: Irish Lack, M.D.    Ct  Abdomen Pelvis Wo Contrast  02/22/2012  *RADIOLOGY REPORT*  Clinical Data:  21- year-old male with a history of perforated diverticulitis status post colectomy, and colostomy complicated by intraperitoneal abscess versus leak.  A percutaneous drainage catheter was placed previously.  The patient is presenting today for persistent leakage  around the catheter insertion site concerning for tube displacement.  The last CT evaluation was 2 weeks ago on 02/09/2012.  Repeat CT scan is performed to evaluate the degree of residual abscess cavity.  CT ABDOMEN AND PELVIS WITHOUT CONTRAST  Technique:  Multidetector CT imaging of the abdomen and pelvis was performed following the standard protocol without intravenous contrast.  Comparison: Most recent prior CT scan 02/09/2012, most recent prior fluoroscopic abscess catheter manipulation and exchange 02/03/2012.  Findings:  Lower Chest:  Persistent but decreasing left layering pleural effusion with associated passive atelectasis of the left lower lobe.  There is some mild chronic atelectasis versus scarring in the right base.  Otherwise, the lung bases are clear.  Heart is within normal limits for size.  No pericardial effusion.  Distal thoracic esophagus is unremarkable.  Abdomen: Interval removal of the more inferiorly positioned percutaneous peritoneal drainage catheter.  The more cephalad drainage catheter remains in good position within the air and fluid cavity.  However, the tube does appear to have been pulled back since the placement images and several of the side holes can be visualized along the soft tissue tract to the level of the skin.  The residual left upper quadrant fluid collection is similar to slightly larger than previously seen on 02/09/2012.  The fluid collection now measures approximately 7.4 x 4.9 cm compared to 6.4 x 3.7 cm on the prior study.  The inferior extent of the collection appears unchanged.  There is persistent gas layering anti dependently within the cephalad margin of the collection. Surgical changes of partial colectomy with Hartmann's pouch.  The fluid collection does abut the Hartmann's pouch in the left hemiabdomen. Diverting colostomy in the left lower quadrant without evidence of peristomal hernia.  Open midline abdominal incision with intact peritoneum.  There is a  small amount of air within the bladder.  Otherwise, unremarkable noncontrasted CT appearance of the spleen, kidneys, liver, gallbladder, pancreas and adrenal glands.  Bones: No acute fracture or aggressive appearing lytic or blastic osseous lesion.  IMPRESSION:  1.  The left hemi abdominal fluid collection abutting the stable margin of the Hartmann's pouch is similar to slightly enlarged compared to the prior study.  2.  Interval removal of the more inferiorly positioned percutaneous drainage catheter.  The more superiorly positioned drainage catheter remains within the abscess cavity, however the side holes along the catheter tubing have been pulled back and are now positioned at the skin.  This may explain the patient's drainage around the tube insertion site.   Original Report Authenticated By: Malachy Moan, M.D.    Ct Abdomen Wo Contrast  02/09/2012  *RADIOLOGY REPORT*  Clinical Data:  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  Status post placement of a drainage catheter for abscess.  CT ABDOMEN WITHOUT CONTRAST  Technique:  Multidetector CT imaging of the abdomen was performed following the standard protocol without IV contrast.  Comparison:  CT abdomen and pelvis 02/02/2012 and 01/27/2012.  Findings:  Small left pleural effusions seen on the most recent CT scan has increased in size.  Associated compressive atelectatic change is noted.  There is some linear atelectasis in the right lung base.  No pericardial  effusion on the right pleural effusion is present.  Pigtail drainage catheter remains in place in the left upper quadrant of the abdomen localizing air fluid collection.  The collection is markedly decreased in size.  The collection had measured approximately 8.4 cm A P by 5.7 cm transverse by 16.1 cm cranial-caudal.  Today, the collection measures 5.7 cm AP by 3.7 cm transverse by 13.5 cm cranial-caudal.  Two drainage catheters localize the collection.  The second  fluid collection in the upper left pelvis had measured 6.82 x 2.9 cm in the axial plane and today measures 5.7 x 1.4 cm in the axial plane on image 66.  No new or enlarging fluid collection is identified  Midline surgical wound is again identified.  Fatty infiltration of the liver without focal lesion is seen.  The gallbladder, adrenal glands, pancreas and kidneys are unremarkable.  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  IMPRESSION:  1.  Decrease in the size of a left side fluid collection consistent with abscess.  Two drainage catheters localize the remaining collection. 2.  Interval decrease in the size of a second, anterior left lower quadrant fluid collection as described above. 3.  Fatty infiltration of the liver.   Original Report Authenticated By: Bernadene Bell. Maricela Curet, M.D.    Ir Catheter Tube Change  02/22/2012  *RADIOLOGY REPORT*  IR ABSCESS DRAIN TUBE CHANGE UNDER FLUOROSCOPY  Date: 02/22/2012  Clinical History: 41 year old male with a history of perforated colonic diverticulitis status post partial colectomy, Hartmann's pouch and diverting colostomy complicated by presumed leak at the [facility] pouch staple line and percutaneous drainage of intra- abdominal abscess.  He has been experiencing significant drainage from the tube entry site.  A noncontrast CT scan of the abdomen demonstrated continued fluid collection within the left hemiabdomen.   It appears that some the side holes of the existing biliary drain are within the soft tissue tract and may be at the skin level resulting in drainage of fluid at the tube entry site. The drain will be evaluated, and exchanged over upsized under fluoroscopy.  Procedures Performed: 1. Contrast injection through the existing tube under fluoroscopy. 2.  Successful upsizing of existing 12-French biliary drain for a 14-French Cook multipurpose drainage catheter  Interventional Radiologist:  Sterling Big, MD  Fluoroscopy time: 4.2 minutes  Contrast volume: 20  ml Omnipaque-300 administered into the peritoneal cavity.  PROCEDURE/FINDINGS:   Informed consent was obtained from the patient following explanation of the procedure, risks, benefits and alternatives. The patient understands, agrees and consents for the procedure. All questions were addressed. A time out was performed.  Maximal barrier sterile technique utilized including caps, mask, sterile gowns, sterile gloves, large sterile drape, hand hygiene, and betadine skin prep.  The existing catheter was examined.  Side holes can be seen at the skin entry site and foul-smelling tan material can be seen draining from the side hole onto the skin.  The tube was carefully advanced into the soft tissue track a gentle hand injection of contrast material performed.  There is a persistent collection within the peritoneal space.  The catheter was then transected and an Amplatz wire advanced through the tube and positioned within the intraperitoneal collection.  The tube was then removed over a wire and a 14 [place] Cook all-purpose drainage catheter was advanced into the peritoneal fluid collection.  Approximately 20 ml of foul- smelling tan material was then successfully aspirated.  The catheter was manipulated under fluoroscopy into all portions of the fluid collection.  It was noted that using suction, little to no fluid comes out however, when left to gravity there is a slow drainage of purulent material.  I believe that the fluid collection is rather collapsed, and that suction draws adipose tissue into the side holes preventing further drainage.  Therefore, the catheter was secured to the skin with 0 Prolene suture and connected to a gravity bag for drainage.  The patient tolerated the procedure well, there was no immediate complication. At the end of the procedure, there is no further drainage onto the skin from the tube insertion site.  IMPRESSION:  1.  Successful exchange of existing 12-French biliary drain for a  14-French Cook all-purpose drainage catheter.  Care was taken to make sure that all tube side holes were within the intraperitoneal fluid collection. At the end of the procedure, no further drainage from the tube entry site was noted.  2.  Conversion from suction to gravity drainage.   Suction appears be drawing loose adipose tissue into the catheter side holes impeding successful drainage.  3.  Repeat CT imaging versus tube injection in approximately 4 weeks.  Signed,  Sterling Big, MD Vascular & Interventional Radiologist [facility]   Original Report Authenticated By: Malachy Moan, M.D.   Results for orders placed during the hospital encounter of 03/06/12  CBC WITH DIFFERENTIAL      Component Value Range   WBC 14.6 (*) 4.0 - 10.5 K/uL   RBC 3.48 (*) 4.22 - 5.81 MIL/uL   Hemoglobin 9.9 (*) 13.0 - 17.0 g/dL   HCT 60.4 (*) 54.0 - 98.1 %   MCV 86.2  78.0 - 100.0 fL   MCH 28.4  26.0 - 34.0 pg   MCHC 33.0  30.0 - 36.0 g/dL   RDW 19.1  47.8 - 29.5 %   Platelets 591 (*) 150 - 400 K/uL   Neutrophils Relative 79 (*) 43 - 77 %   Lymphocytes Relative 9 (*) 12 - 46 %   Monocytes Relative 12  3 - 12 %   Eosinophils Relative 0  0 - 5 %   Basophils Relative 0  0 - 1 %   Neutro Abs 11.5 (*) 1.7 - 7.7 K/uL   Lymphs Abs 1.3  0.7 - 4.0 K/uL   Monocytes Absolute 1.8 (*) 0.1 - 1.0 K/uL   Eosinophils Absolute 0.0  0.0 - 0.7 K/uL   Basophils Absolute 0.0  0.0 - 0.1 K/uL   RBC Morphology POLYCHROMASIA PRESENT     WBC Morphology MILD LEFT SHIFT (1-5% METAS, OCC MYELO, OCC BANDS)     Smear Review LARGE PLATELETS PRESENT    COMPREHENSIVE METABOLIC PANEL      Component Value Range   Sodium 131 (*) 135 - 145 mEq/L   Potassium 2.9 (*) 3.5 - 5.1 mEq/L   Chloride 92 (*) 96 - 112 mEq/L   CO2 26  19 - 32 mEq/L   Glucose, Bld 123 (*) 70 - 99 mg/dL   BUN 11  6 - 23 mg/dL   Creatinine, Ser 6.21  0.50 - 1.35 mg/dL   Calcium 8.8  8.4 - 30.8 mg/dL   Total Protein 7.3  6.0 - 8.3 g/dL   Albumin 2.2  (*) 3.5 - 5.2 g/dL   AST 29  0 - 37 U/L   ALT 34  0 - 53 U/L   Alkaline Phosphatase 113  39 - 117 U/L   Total Bilirubin 0.4  0.3 - 1.2 mg/dL   GFR calc  non Af Amer >90  >90 mL/min   GFR calc Af Amer >90  >90 mL/min  MAGNESIUM      Component Value Range   Magnesium 1.9  1.5 - 2.5 mg/dL  PHOSPHORUS      Component Value Range   Phosphorus 3.6  2.3 - 4.6 mg/dL  URINALYSIS, ROUTINE W REFLEX MICROSCOPIC      Component Value Range   Color, Urine YELLOW  YELLOW   APPearance CLEAR  CLEAR   Specific Gravity, Urine 1.019  1.005 - 1.030   pH 6.0  5.0 - 8.0   Glucose, UA NEGATIVE  NEGATIVE mg/dL   Hgb urine dipstick NEGATIVE  NEGATIVE   Bilirubin Urine NEGATIVE  NEGATIVE   Ketones, ur NEGATIVE  NEGATIVE mg/dL   Protein, ur 30 (*) NEGATIVE mg/dL   Urobilinogen, UA 0.2  0.0 - 1.0 mg/dL   Nitrite NEGATIVE  NEGATIVE   Leukocytes, UA NEGATIVE  NEGATIVE  COMPREHENSIVE METABOLIC PANEL      Component Value Range   Sodium 132 (*) 135 - 145 mEq/L   Potassium 2.6 (*) 3.5 - 5.1 mEq/L   Chloride 94 (*) 96 - 112 mEq/L   CO2 27  19 - 32 mEq/L   Glucose, Bld 157 (*) 70 - 99 mg/dL   BUN 11  6 - 23 mg/dL   Creatinine, Ser 5.28  0.50 - 1.35 mg/dL   Calcium 8.4  8.4 - 41.3 mg/dL   Total Protein 6.3  6.0 - 8.3 g/dL   Albumin 2.0 (*) 3.5 - 5.2 g/dL   AST 20  0 - 37 U/L   ALT 24  0 - 53 U/L   Alkaline Phosphatase 90  39 - 117 U/L   Total Bilirubin 0.2 (*) 0.3 - 1.2 mg/dL   GFR calc non Af Amer >90  >90 mL/min   GFR calc Af Amer >90  >90 mL/min  MAGNESIUM      Component Value Range   Magnesium 2.0  1.5 - 2.5 mg/dL  PHOSPHORUS      Component Value Range   Phosphorus 3.0  2.3 - 4.6 mg/dL  CHOLESTEROL, TOTAL      Component Value Range   Cholesterol 143  0 - 200 mg/dL  TRIGLYCERIDES      Component Value Range   Triglycerides 234 (*) <150 mg/dL  CBC      Component Value Range   WBC 11.2 (*) 4.0 - 10.5 K/uL   RBC 2.92 (*) 4.22 - 5.81 MIL/uL   Hemoglobin 8.2 (*) 13.0 - 17.0 g/dL   HCT 24.4 (*) 01.0  - 52.0 %   MCV 87.3  78.0 - 100.0 fL   MCH 28.1  26.0 - 34.0 pg   MCHC 32.2  30.0 - 36.0 g/dL   RDW 27.2  53.6 - 64.4 %   Platelets 514 (*) 150 - 400 K/uL  DIFFERENTIAL      Component Value Range   Neutrophils Relative 76  43 - 77 %   Lymphocytes Relative 8 (*) 12 - 46 %   Monocytes Relative 12  3 - 12 %   Eosinophils Relative 3  0 - 5 %   Basophils Relative 1  0 - 1 %   Neutro Abs 8.6 (*) 1.7 - 7.7 K/uL   Lymphs Abs 0.9  0.7 - 4.0 K/uL   Monocytes Absolute 1.3 (*) 0.1 - 1.0 K/uL   Eosinophils Absolute 0.3  0.0 - 0.7 K/uL   Basophils Absolute 0.1  0.0 -  0.1 K/uL   RBC Morphology POLYCHROMASIA PRESENT     WBC Morphology TOXIC GRANULATION     Smear Review PENDING PATHOLOGIST REVIEW    GLUCOSE, CAPILLARY      Component Value Range   Glucose-Capillary 120 (*) 70 - 99 mg/dL   Comment 1 Notify RN    URINE MICROSCOPIC-ADD ON      Component Value Range   Squamous Epithelial / LPF FEW (*) RARE   WBC, UA 3-6  <3 WBC/hpf   RBC / HPF 0-2  <3 RBC/hpf   Bacteria, UA RARE  RARE   Urine-Other FEW YEAST    GLUCOSE, CAPILLARY      Component Value Range   Glucose-Capillary 138 (*) 70 - 99 mg/dL  GLUCOSE, CAPILLARY      Component Value Range   Glucose-Capillary 140 (*) 70 - 99 mg/dL   A/P: Pt with enlarging left abdominal abscess, s/p perforated colon with partial colectomy/colostomy. Plan is for CT guided drainage of more superior left abdominal abscess today. Details/risks of procedure d/w pt/father with their understanding and consent.

## 2012-03-07 NOTE — Progress Notes (Signed)
Awaiting drain placement Feels better Ostomy functioning, but retracted VAC over wound WBC improved  Replete K On TNA  Kimberl Vig K. Corliss Skains, MD, [facility]  03/07/2012 1:42 PM

## 2012-03-07 NOTE — ED Notes (Signed)
Pt. C/o pain at catheter site during procedure prep. MD order to give fentanyl before TO.

## 2012-03-07 NOTE — Procedures (Signed)
Technically successful fluoroscopic guided exchange of 14 French drainage catheter.   Technically successful fluoroscopic guided placement of an additional, more cranially postioning 14 French drainage catheter. A total of 420 cc of purulent, foul smelling fluid aspiration. Both catheters connected to drainage bags. No immediate post procedural complication.

## 2012-03-08 LAB — COMPREHENSIVE METABOLIC PANEL
ALT: 34 U/L (ref 0–53)
Alkaline Phosphatase: 81 U/L (ref 39–117)
CO2: 26 mEq/L (ref 19–32)
Calcium: 8.3 mg/dL — ABNORMAL LOW (ref 8.4–10.5)
Chloride: 95 mEq/L — ABNORMAL LOW (ref 96–112)
GFR calc Af Amer: >90 mL/min (ref >90)
GFR calc non Af Amer: >90 mL/min (ref >90)
Glucose, Bld: 132 mg/dL — ABNORMAL HIGH (ref 70–99)
Potassium: 3.1 mEq/L — ABNORMAL LOW (ref 3.5–5.1)
Sodium: 132 mEq/L — ABNORMAL LOW (ref 135–145)
Total Bilirubin: 0.2 mg/dL — ABNORMAL LOW (ref 0.3–1.2)

## 2012-03-08 LAB — GLUCOSE, CAPILLARY: Glucose-Capillary: 115 mg/dL — ABNORMAL HIGH (ref 70–99)

## 2012-03-08 MED ORDER — POTASSIUM CHLORIDE 10 MEQ/100ML IV SOLN
10.0000 meq | INTRAVENOUS | Status: AC
  Administered 2012-03-08 (×4): 10 meq via INTRAVENOUS
  Filled 2012-03-08 (×4): qty 100

## 2012-03-08 MED ORDER — KCL IN DEXTROSE-NACL 20-5-0.9 MEQ/L-%-% IV SOLN
INTRAVENOUS | Status: DC
  Administered 2012-03-08 – 2012-03-13 (×5): via INTRAVENOUS
  Administered 2012-03-14: 80 mL via INTRAVENOUS
  Administered 2012-03-14: 05:00:00 via INTRAVENOUS
  Filled 2012-03-08 (×14): qty 1000

## 2012-03-08 MED ORDER — CLINIMIX E/DEXTROSE (5/15) 5 % IV SOLN
INTRAVENOUS | Status: AC
  Administered 2012-03-08: 18:00:00 via INTRAVENOUS
  Filled 2012-03-08: qty 2000

## 2012-03-08 MED ORDER — INSULIN ASPART 100 UNIT/ML ~~LOC~~ SOLN
0.0000 [IU] | Freq: Four times a day (QID) | SUBCUTANEOUS | Status: DC
  Administered 2012-03-08 (×2): 1 [IU] via SUBCUTANEOUS

## 2012-03-08 NOTE — Progress Notes (Signed)
CBG taken per new schedule as ordered by pharmacy (q6h). No coverage needed. CBG 114.

## 2012-03-08 NOTE — Progress Notes (Signed)
Subjective: Pt ok. Feels a little better after second drain placed yesterday. On liquids diet.  Objective: Physical Exam: BP 104/69  Pulse 86  Temp 98.1 F (36.7 C) (Oral)  Resp 18  Ht 5\' 10"  (1.778 m)  Wt 245 lb (111.131 kg)  BMI 35.15 kg/m2  SpO2 99% (L)abd drain intact, site clean with new dressing. Output bloody purulent   Labs: CBC  Basename 03/07/12 0530 03/06/12 1315  WBC 11.2* 14.6*  HGB 8.2* 9.9*  HCT 25.5* 30.0*  PLT 514* 591*   BMET  Basename 03/08/12 0605 03/07/12 0530  NA 132* 132*  K 3.1* 2.6*  CL 95* 94*  CO2 26 27  GLUCOSE 132* 157*  BUN 8 11  CREATININE 0.71 0.81  CALCIUM 8.3* 8.4   LFT  Basename 03/08/12 0605  PROT 6.0  ALBUMIN 1.8*  AST 59*  ALT 34  ALKPHOS 81  BILITOT 0.2*  BILIDIR --  IBILI --  LIPASE --   PT/INR  Basename 03/07/12 1000  LABPROT 17.6*  INR 1.49     Studies/Results: Ct Abdomen Pelvis Wo Contrast  03/06/2012  *RADIOLOGY REPORT*  Clinical Data: Colonic perforation complicated by prior abscess and status post placement of prior percutaneous drains in a left-sided peritoneal abscess.  A single percutaneous drain remains present. There is drainage around a colostomy site and around the indwelling percutaneous drain.  CT ABDOMEN AND PELVIS WITHOUT CONTRAST  Technique:  Multidetector CT imaging of the abdomen and pelvis was performed following the standard protocol without intravenous contrast.  Comparison: 02/22/2012  Findings: After exchange and upsizing of a previously placed left sided percutaneous abscess drainage catheter, the catheter is visualized and in good position within the inferior aspect of a residual left lateral abdominal abscess.  The irregular and elongated collection appears larger by imaging with maximal transverse diameter of approximately 8.5 cm and maximal height of nearly 17 cm.  There is some air in the irregularly shaped collection.  Small loculations of fluid and air in the left upper quadrant  likely all communicate with the main abscess cavity. Given current positioning of the preexisting percutaneous drain in the inferior aspect of the collection, the patient may benefit from additional drainage catheter placement in the more superior aspect of the collection.  No additional abscess collections are identified.  There is no evidence of bowel perforation or obstruction.  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  No pelvic abnormalities.  IMPRESSION: Enlargement of irregular shaped abscess in the left lateral abdomen that extends all the way up into the left upper quadrant adjacent to the stomach.  Inferiorly placed percutaneous drain appears well positioned.  Given enlargement since prior CT, the patient may benefit from placement of a second drain in the more superior aspect of the abscess.   Original Report Authenticated By: Irish Lack, M.D.    Ir Catheter Tube Change  03/07/2012  *RADIOLOGY REPORT*  Clinical Data: Recurrent, enlarging left upper abdominal abscess  1.  FLUOROSCOPIC GUIDED ABSCESS DRAINAGE CATHETER PLACEMENT 2.  FLUOROSCOPIC GUIDED ABSCESS DRAINAGE CATHETER EXCHANGE  Comparisons: CT abdomen pelvis - 03/06/2012; 02/22/2012; 02/09/2012; fluoroscopic guided access catheter placement/exchanged/upsizing - 02/22/2012; 02/03/2012  Intravenous Medications: Fentanyl 300 mcg IV; Versed 2 mg IV  Contrast: 20 ml, administered into the abscess cavity within the left upper abdominal quadrant.  Total Moderate Sedation Time: 20 minutes  Fluoroscopy Time: 4.1 minutes.  Complications: None immediate  Technique / Findings:  Informed written consent was obtained from the patient after a discussion of the  risks, benefits and alternatives to treatment. Questions regarding the procedure were encouraged and answered.  A timeout was performed prior to the initiation of the procedure.  The exit site of the existing left upper abdominal drainage catheter and surrounding skin were prepped and draped  in the usual sterile fashion, and a sterile drape was applied covering the operative field.  Maximum barrier sterile technique with sterile gowns and gloves were used for the procedure.  A timeout was performed prior to the initiation of the procedure.  Local anesthesia was provided with 1% lidocaine.  A small amount of contrast was injected via the existing left upper abdominal abscess drainage catheter. The external portion of the existing catheter was cut and cannulated with a Benson wire.  A 9- French vascular sheath was advanced into the fluid collection, utilized to place an additional Transport planner.  With the use of a Kumpe catheter, a Benson wire was manipulated into the cranial aspect of the large left upper quadrant abdominal abscess.  Contrast injection was performed to delineate the cranial extent of the collection.  Under intermittent fluoroscopic guidance, the Kumpe catheter was exchanged for a new 14-French all- purpose drainage catheter with tip coiled and locked within the superior most aspect of the abscess.  Of note, the catheter could not be positioned any more cranially within the abscess given the length of this large-bore all-purpose drainage catheter.  With the use of the Kumpe catheter, with the additional [facility] wire was manipulated into the caudal aspect of the abscess. Contrast injection was performed to demarcate the caudal extent of the abscess.  Under intermittent fluoroscopic guidance, the Kumpe catheter was exchanged for an additional 14-French all-purpose drainage catheter with tip coiled and locked within the inferior/caudal extent of the abscess collection.  Both catheters were secured to the skin with interrupted sutures. A total of approximately 420 ml of purulent, foul-smelling fluid was aspirated from both of the catheters.  Both catheters were connected to a drainage bag.  Dressings were placed.  The patient tolerated the procedure well without immediate postprocedural  complication.  Impression:  Successful fluoroscopic guided repositioning and placement of two 14-French all-purpose drainage catheters with one drain positioned within the cranial aspect of the abscess and the other within the caudal aspect of the abscess.  A total of approximately 420 ml of purulent, foul-smelling fluid was aspirated from both of the catheters.   Original Report Authenticated By: Tacey Ruiz, MD    Ir Era Skeen W Catheter Placement  03/07/2012  *RADIOLOGY REPORT*  Clinical Data: Recurrent, enlarging left upper abdominal abscess  1.  FLUOROSCOPIC GUIDED ABSCESS DRAINAGE CATHETER PLACEMENT 2.  FLUOROSCOPIC GUIDED ABSCESS DRAINAGE CATHETER EXCHANGE  Comparisons: CT abdomen pelvis - 03/06/2012; 02/22/2012; 02/09/2012; fluoroscopic guided access catheter placement/exchanged/upsizing - 02/22/2012; 02/03/2012  Intravenous Medications: Fentanyl 300 mcg IV; Versed 2 mg IV  Contrast: 20 ml, administered into the abscess cavity within the left upper abdominal quadrant.  Total Moderate Sedation Time: 20 minutes  Fluoroscopy Time: 4.1 minutes.  Complications: None immediate  Technique / Findings:  Informed written consent was obtained from the patient after a discussion of the risks, benefits and alternatives to treatment. Questions regarding the procedure were encouraged and answered.  A timeout was performed prior to the initiation of the procedure.  The exit site of the existing left upper abdominal drainage catheter and surrounding skin were prepped and draped in the usual sterile fashion, and a sterile drape was applied covering the operative field.  Maximum  barrier sterile technique with sterile gowns and gloves were used for the procedure.  A timeout was performed prior to the initiation of the procedure.  Local anesthesia was provided with 1% lidocaine.  A small amount of contrast was injected via the existing left upper abdominal abscess drainage catheter. The external portion of the existing  catheter was cut and cannulated with a Benson wire.  A 9- French vascular sheath was advanced into the fluid collection, utilized to place an additional Transport planner.  With the use of a Kumpe catheter, a Benson wire was manipulated into the cranial aspect of the large left upper quadrant abdominal abscess.  Contrast injection was performed to delineate the cranial extent of the collection.  Under intermittent fluoroscopic guidance, the Kumpe catheter was exchanged for a new 14-French all- purpose drainage catheter with tip coiled and locked within the superior most aspect of the abscess.  Of note, the catheter could not be positioned any more cranially within the abscess given the length of this large-bore all-purpose drainage catheter.  With the use of the Kumpe catheter, with the additional [facility] wire was manipulated into the caudal aspect of the abscess. Contrast injection was performed to demarcate the caudal extent of the abscess.  Under intermittent fluoroscopic guidance, the Kumpe catheter was exchanged for an additional 14-French all-purpose drainage catheter with tip coiled and locked within the inferior/caudal extent of the abscess collection.  Both catheters were secured to the skin with interrupted sutures. A total of approximately 420 ml of purulent, foul-smelling fluid was aspirated from both of the catheters.  Both catheters were connected to a drainage bag.  Dressings were placed.  The patient tolerated the procedure well without immediate postprocedural complication.  Impression:  Successful fluoroscopic guided repositioning and placement of two 14-French all-purpose drainage catheters with one drain positioned within the cranial aspect of the abscess and the other within the caudal aspect of the abscess.  A total of approximately 420 ml of purulent, foul-smelling fluid was aspirated from both of the catheters.   Original Report Authenticated By: Tacey Ruiz, MD     Assessment/Plan: Pt with  enlarging left abdominal abscess, s/p perforated colon with partial colectomy/colostomy. S/p new additional (L)abd drain and exchange of existing drain. Cont purulent output Will cont to follow Other plans per [facility]    LOS: 2 days    Brayton El PA-C 03/08/2012 9:00 AM

## 2012-03-08 NOTE — Progress Notes (Signed)
PT NOTE  Order received. Chart reviewed. Spoke with pt/family. Pt denies any difficulty mobilizing except for pain with activity. Pt's family present and they state they will assist pt OOB/with ambulation later today (at a time that works best for pt). Pt/family do not feel there are any PT needs at this time. However, pt/family agreed to having therapist check back on tomorrow and to evaluate/tx pt if there are any problems mobilizing today. Thanks. [name], PT [phone]

## 2012-03-08 NOTE — Plan of Care (Signed)
Problem: Phase II Progression Outcomes Goal: Return of bowel function (flatus, BM) IF ABDOMINAL SURGERY:  Outcome: Completed/Met Date Met:  03/08/12 Colostomy intact and functional.

## 2012-03-08 NOTE — Progress Notes (Signed)
Subjective: Feels better this morning less LLQ pain, still tender from procedure, occasional "nausea when drain is flushed" Tolerating clears w/o N/V/D ostomy out put has increased. Abdominal drains draining dark cloudy fluid. pain is well managed per patient.. Objective: Vital signs in last 24 hours: Temp:  [97.8 F (36.6 C)-98.2 F (36.8 C)] 98.1 F (36.7 C) (11/21 0620) Pulse Rate:  [86-195] 86  (11/21 0730) Resp:  [16-33] 18  (11/21 0730) BP: (99-127)/(66-88) 104/69 mmHg (11/21 0620) SpO2:  [97 %-100 %] 99 % (11/21 0620) Last BM Date: 03/07/12  Intake/Output from previous day: 11/20 0701 - 11/21 0700 In: 5566.7 [P.O.:2760; I.V.:2171.7; TPN:480] Out: 2275 [Urine:1900; Drains:175; Stool:200] Intake/Output this shift:    General appearance: alert, cooperative, appears stated age and no distress Chest: CTA Cardiac: RRR Abdomen: abdominal drains (#1 ) (#2 75ml) ostomy ( liquid stool) wound vac in place and functioning well. Extremities: warm to touch, no edema, tenderness, + pulses. VSS, afebrile Labs: Hypokalemia improved but still requiring repletion. Mild hyponatremia (asymptomatic), hypochloremic. On TNA ,lipids. Bun and creatine improved. Lab Results:   Basename 03/07/12 0530 03/06/12 1315  WBC 11.2* 14.6*  HGB 8.2* 9.9*  HCT 25.5* 30.0*  PLT 514* 591*   BMET  Basename 03/08/12 0605 03/07/12 0530  NA 132* 132*  K 3.1* 2.6*  CL 95* 94*  CO2 26 27  GLUCOSE 132* 157*  BUN 8 11  CREATININE 0.71 0.81  CALCIUM 8.3* 8.4   PT/INR  Basename 03/07/12 1000  LABPROT 17.6*  INR 1.49   ABG No results found for this basename: PHART:2,PCO2:2,PO2:2,HCO3:2 in the last 72 hours  Studies/Results: Ct Abdomen Pelvis Wo Contrast  03/06/2012  *RADIOLOGY REPORT*  Clinical Data: Colonic perforation complicated by prior abscess and status post placement of prior percutaneous drains in a left-sided peritoneal abscess.  A single percutaneous drain remains present.  There is drainage around a colostomy site and around the indwelling percutaneous drain.  CT ABDOMEN AND PELVIS WITHOUT CONTRAST  Technique:  Multidetector CT imaging of the abdomen and pelvis was performed following the standard protocol without intravenous contrast.  Comparison: 02/22/2012  Findings: After exchange and upsizing of a previously placed left sided percutaneous abscess drainage catheter, the catheter is visualized and in good position within the inferior aspect of a residual left lateral abdominal abscess.  The irregular and elongated collection appears larger by imaging with maximal transverse diameter of approximately 8.5 cm and maximal height of nearly 17 cm.  There is some air in the irregularly shaped collection.  Small loculations of fluid and air in the left upper quadrant likely all communicate with the main abscess cavity. Given current positioning of the preexisting percutaneous drain in the inferior aspect of the collection, the patient may benefit from additional drainage catheter placement in the more superior aspect of the collection.  No additional abscess collections are identified.  There is no evidence of bowel perforation or obstruction.  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  No pelvic abnormalities.  IMPRESSION: Enlargement of irregular shaped abscess in the left lateral abdomen that extends all the way up into the left upper quadrant adjacent to the stomach.  Inferiorly placed percutaneous drain appears well positioned.  Given enlargement since prior CT, the patient may benefit from placement of a second drain in the more superior aspect of the abscess.   Original Report Authenticated By: Irish Lack, M.D.    Ir Catheter Tube Change  03/07/2012  *RADIOLOGY REPORT*  Clinical Data: Recurrent, enlarging left upper  abdominal abscess  1.  FLUOROSCOPIC GUIDED ABSCESS DRAINAGE CATHETER PLACEMENT 2.  FLUOROSCOPIC GUIDED ABSCESS DRAINAGE CATHETER EXCHANGE  Comparisons: CT  abdomen pelvis - 03/06/2012; 02/22/2012; 02/09/2012; fluoroscopic guided access catheter placement/exchanged/upsizing - 02/22/2012; 02/03/2012  Intravenous Medications: Fentanyl 300 mcg IV; Versed 2 mg IV  Contrast: 20 ml, administered into the abscess cavity within the left upper abdominal quadrant.  Total Moderate Sedation Time: 20 minutes  Fluoroscopy Time: 4.1 minutes.  Complications: None immediate  Technique / Findings:  Informed written consent was obtained from the patient after a discussion of the risks, benefits and alternatives to treatment. Questions regarding the procedure were encouraged and answered.  A timeout was performed prior to the initiation of the procedure.  The exit site of the existing left upper abdominal drainage catheter and surrounding skin were prepped and draped in the usual sterile fashion, and a sterile drape was applied covering the operative field.  Maximum barrier sterile technique with sterile gowns and gloves were used for the procedure.  A timeout was performed prior to the initiation of the procedure.  Local anesthesia was provided with 1% lidocaine.  A small amount of contrast was injected via the existing left upper abdominal abscess drainage catheter. The external portion of the existing catheter was cut and cannulated with a Benson wire.  A 9- French vascular sheath was advanced into the fluid collection, utilized to place an additional Transport planner.  With the use of a Kumpe catheter, a Benson wire was manipulated into the cranial aspect of the large left upper quadrant abdominal abscess.  Contrast injection was performed to delineate the cranial extent of the collection.  Under intermittent fluoroscopic guidance, the Kumpe catheter was exchanged for a new 14-French all- purpose drainage catheter with tip coiled and locked within the superior most aspect of the abscess.  Of note, the catheter could not be positioned any more cranially within the abscess given the length of  this large-bore all-purpose drainage catheter.  With the use of the Kumpe catheter, with the additional [facility] wire was manipulated into the caudal aspect of the abscess. Contrast injection was performed to demarcate the caudal extent of the abscess.  Under intermittent fluoroscopic guidance, the Kumpe catheter was exchanged for an additional 14-French all-purpose drainage catheter with tip coiled and locked within the inferior/caudal extent of the abscess collection.  Both catheters were secured to the skin with interrupted sutures. A total of approximately 420 ml of purulent, foul-smelling fluid was aspirated from both of the catheters.  Both catheters were connected to a drainage bag.  Dressings were placed.  The patient tolerated the procedure well without immediate postprocedural complication.  Impression:  Successful fluoroscopic guided repositioning and placement of two 14-French all-purpose drainage catheters with one drain positioned within the cranial aspect of the abscess and the other within the caudal aspect of the abscess.  A total of approximately 420 ml of purulent, foul-smelling fluid was aspirated from both of the catheters.   Original Report Authenticated By: Tacey Ruiz, MD    Ir Era Skeen W Catheter Placement  03/07/2012  *RADIOLOGY REPORT*  Clinical Data: Recurrent, enlarging left upper abdominal abscess  1.  FLUOROSCOPIC GUIDED ABSCESS DRAINAGE CATHETER PLACEMENT 2.  FLUOROSCOPIC GUIDED ABSCESS DRAINAGE CATHETER EXCHANGE  Comparisons: CT abdomen pelvis - 03/06/2012; 02/22/2012; 02/09/2012; fluoroscopic guided access catheter placement/exchanged/upsizing - 02/22/2012; 02/03/2012  Intravenous Medications: Fentanyl 300 mcg IV; Versed 2 mg IV  Contrast: 20 ml, administered into the abscess cavity within the left upper abdominal quadrant.  Total Moderate Sedation Time: 20 minutes  Fluoroscopy Time: 4.1 minutes.  Complications: None immediate  Technique / Findings:  Informed written consent  was obtained from the patient after a discussion of the risks, benefits and alternatives to treatment. Questions regarding the procedure were encouraged and answered.  A timeout was performed prior to the initiation of the procedure.  The exit site of the existing left upper abdominal drainage catheter and surrounding skin were prepped and draped in the usual sterile fashion, and a sterile drape was applied covering the operative field.  Maximum barrier sterile technique with sterile gowns and gloves were used for the procedure.  A timeout was performed prior to the initiation of the procedure.  Local anesthesia was provided with 1% lidocaine.  A small amount of contrast was injected via the existing left upper abdominal abscess drainage catheter. The external portion of the existing catheter was cut and cannulated with a Benson wire.  A 9- French vascular sheath was advanced into the fluid collection, utilized to place an additional Transport planner.  With the use of a Kumpe catheter, a Benson wire was manipulated into the cranial aspect of the large left upper quadrant abdominal abscess.  Contrast injection was performed to delineate the cranial extent of the collection.  Under intermittent fluoroscopic guidance, the Kumpe catheter was exchanged for a new 14-French all- purpose drainage catheter with tip coiled and locked within the superior most aspect of the abscess.  Of note, the catheter could not be positioned any more cranially within the abscess given the length of this large-bore all-purpose drainage catheter.  With the use of the Kumpe catheter, with the additional [facility] wire was manipulated into the caudal aspect of the abscess. Contrast injection was performed to demarcate the caudal extent of the abscess.  Under intermittent fluoroscopic guidance, the Kumpe catheter was exchanged for an additional 14-French all-purpose drainage catheter with tip coiled and locked within the inferior/caudal extent of the  abscess collection.  Both catheters were secured to the skin with interrupted sutures. A total of approximately 420 ml of purulent, foul-smelling fluid was aspirated from both of the catheters.  Both catheters were connected to a drainage bag.  Dressings were placed.  The patient tolerated the procedure well without immediate postprocedural complication.  Impression:  Successful fluoroscopic guided repositioning and placement of two 14-French all-purpose drainage catheters with one drain positioned within the cranial aspect of the abscess and the other within the caudal aspect of the abscess.  A total of approximately 420 ml of purulent, foul-smelling fluid was aspirated from both of the catheters.   Original Report Authenticated By: Tacey Ruiz, MD     Anti-infectives: Anti-infectives     Start     Dose/Rate Route Frequency Ordered Stop   03/06/12 1500   piperacillin-tazobactam (ZOSYN) IVPB 3.375 g     Comments: Pharmacy to check dosing      3.375 g 12.5 mL/hr over 240 Minutes Intravenous 3 times per day 03/06/12 1145            Assessment/Plan:  Patient Active Problem List  Diagnosis  . UTI (lower urinary tract infection)  . Constipation  . Diverticulitis of colon without hemorrhage  . Abdominal pain  . Colon obstruction  . Obesity, Class III, BMI 40-49.9 (morbid obesity)  . Acute respiratory failure with hypoxia  . Acute pulmonary edema  . Encephalopathy acute  . Tobacco abuse  . Intra-abdominal abscess, diverticular s/p surgical drainage  . Hypertriglyceridemia  Hyponatrmia, hypochloremic  on TNA (asymptomatic) Leukocytosis (improving)  s/p * No surgery found * 1. Failure to thrive/wound breakdown: IV fluids, PICC line, TNA, IV antibiotics. He will require wound care and will need to be seen by the wound ostomy care nurse for a severely stenotic colostomy and large abdominal wound that is not healing well. Wound vac per wound care team (chng M,W,F)  2.PT/OT 3. Encourage  IS/OOB  4. Continue with  Clears for now (will discuss adv diet with Dr. Corliss Skains) 5. Follow clinical picture 6. Drain management per IR   LOS: 2 days    Golda Acre [facility] Surgery Pager [phone]  03/08/2012

## 2012-03-08 NOTE — Progress Notes (Signed)
PARENTERAL NUTRITION CONSULT NOTE - FOLLOW UP  Pharmacy Consult for TNA Indication: Failure to thrive s/p colostomy with wound breakdown  Allergies  Allergen Reactions  . Contrast Media (Iodinated Diagnostic Agents) Other (See Comments)    Kidney failure    Patient Measurements: Height: 5\' 10"  (177.8 cm) Weight: 245 lb (111.131 kg) IBW/kg (Calculated) : 73  Adjusted Body Weight: 88kg Usual Weight: 125kg   Vital Signs: Temp: 97.8 F (36.6 C) (11/20 2220) Temp src: Oral (11/20 2220) BP: 119/77 mmHg (11/20 2220) Pulse Rate: 105  (11/20 2220) Intake/Output from previous day: 11/20 0701 - 11/21 0700 In: 5566.7 [P.O.:2760; I.V.:2171.7; TPN:480] Out: 2275 [Urine:1900; Drains:175; Stool:200] Intake/Output from this shift:    Labs:  [facility] 03/07/12 1000 03/07/12 0530 03/06/12 1315  WBC -- 11.2* 14.6*  HGB -- 8.2* 9.9*  HCT -- 25.5* 30.0*  PLT -- 514* 591*  APTT 47* -- --  INR 1.49 -- --     [facility] 03/08/12 0605 03/07/12 0530 03/06/12 1315  NA 132* 132* 131*  K 3.1* 2.6* 2.9*  CL 95* 94* 92*  CO2 26 27 26   GLUCOSE 132* 157* 123*  BUN 8 11 11   CREATININE 0.71 0.81 0.80  LABCREA -- -- --  CREAT24HRUR -- -- --  CALCIUM 8.3* 8.4 8.8  MG 1.9 2.0 1.9  PHOS 3.3 3.0 3.6  PROT 6.0 6.3 7.3  ALBUMIN 1.8* 2.0* 2.2*  AST 59* 20 29  ALT 34 24 34  ALKPHOS 81 90 113  BILITOT 0.2* 0.2* 0.4  BILIDIR -- -- --  IBILI -- -- --  PREALBUMIN -- 4.9* --  TRIG -- 234* --  CHOLHDL -- -- --  CHOL -- 143 --  Corrected calcium 10.6 (11/21) Estimated Creatinine Clearance: 153.1 ml/min (by C-G formula based on Cr of 0.71).    Medications:  Scheduled:    . insulin aspart  0-9 Units Subcutaneous Q8H  . [EXPIRED] lidocaine      . pantoprazole (PROTONIX) IV  40 mg Intravenous QHS  . piperacillin-tazobactam (ZOSYN)  IV  3.375 g Intravenous Q8H  . [COMPLETED] potassium chloride  10 mEq Intravenous Q1 Hr x 5   Infusions:    . dextrose 5 % and 0.9 % NaCl with KCl 20 mEq/L 100  mL/hr at 03/07/12 1008  . fat emulsion 250 mL (03/07/12 1748)  . [EXPIRED] TPN (CLINIMIX) +/- additives 40 mL/hr at 03/06/12 1801  . 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 Patient has a history of being on TPN and TF during previous admissions however patient was not discharged on any nutritional support after last admission. Patient admitted with 2 days of N/V, failure to thrive and poor wound healing. Weight lose of 30 pounds in past 2 months. CT scan 11/19 shows Left abdominal abscess s/p CT guided drainage and drain placement 11/20. TNA per pharmacy started 11/19   Electrolytes: K+ low but improved after 5 KCl runs 11/20; Na low (unable to adjust in TNA); Mg/Phos/Corrected Ca WNL  LFTs: WNL except AST mildly elevated  TGs: elevated at baseline (234  on 11/20)  Prealbumin: severely low (4.9 on 11/20)   Plan: at 1800 tonight  Change Clinimix to E5/15 at 44ml/hr  Decrease IVF to 94ml/hr to compensate  KCl IV x 4 runs  Change CBGs to q6h, continue SSI   IV Fat Emulsion 20% on MWF d/t ongoing shortage   Multivitamins and trace elements in TNA MWF d/t ongoing shortage   Routine TNA lab panel Mondays and Thursdays   Monitor elevated TG   Loralee Pacas R 03/08/2012,7:05 AM

## 2012-03-08 NOTE — Progress Notes (Signed)
Feeling better. Drains with good output. VAC on midline - change MWF Cont antibiotics   Wilmon Arms. Corliss Skains, MD, [facility]  03/08/2012 1:04 PM

## 2012-03-09 LAB — GLUCOSE, CAPILLARY
Glucose-Capillary: 110 mg/dL — ABNORMAL HIGH (ref 70–99)
Glucose-Capillary: 111 mg/dL — ABNORMAL HIGH (ref 70–99)
Glucose-Capillary: 123 mg/dL — ABNORMAL HIGH (ref 70–99)

## 2012-03-09 LAB — BASIC METABOLIC PANEL
Calcium: 8.4 mg/dL (ref 8.4–10.5)
Creatinine, Ser: 0.66 mg/dL (ref 0.50–1.35)
GFR calc Af Amer: >90 mL/min (ref >90)
GFR calc non Af Amer: >90 mL/min (ref >90)
Sodium: 136 mEq/L (ref 135–145)

## 2012-03-09 MED ORDER — FAT EMULSION 20 % IV EMUL
250.0000 mL | INTRAVENOUS | Status: AC
  Administered 2012-03-09: 250 mL via INTRAVENOUS
  Filled 2012-03-09: qty 250

## 2012-03-09 MED ORDER — INSULIN ASPART 100 UNIT/ML ~~LOC~~ SOLN: 0.0000 [IU] | Freq: Every day | SUBCUTANEOUS | Status: DC

## 2012-03-09 MED ORDER — INSULIN ASPART 100 UNIT/ML ~~LOC~~ SOLN
0.0000 [IU] | Freq: Three times a day (TID) | SUBCUTANEOUS | Status: DC
  Administered 2012-03-10: 1 [IU] via SUBCUTANEOUS
  Administered 2012-03-11: 2 [IU] via SUBCUTANEOUS
  Administered 2012-03-12: 1 [IU] via SUBCUTANEOUS

## 2012-03-09 MED ORDER — POTASSIUM CHLORIDE 10 MEQ/100ML IV SOLN
10.0000 meq | INTRAVENOUS | Status: AC
  Administered 2012-03-09 (×2): 10 meq via INTRAVENOUS
  Filled 2012-03-09 (×2): qty 100

## 2012-03-09 MED ORDER — TRACE MINERALS CR-CU-F-FE-I-MN-MO-SE-ZN IV SOLN
INTRAVENOUS | Status: AC
  Administered 2012-03-09: 18:00:00 via INTRAVENOUS
  Filled 2012-03-09: qty 2000

## 2012-03-09 NOTE — Plan of Care (Signed)
Problem: Food- and Nutrition-Related Knowledge Deficit (NB-1.1) Goal: Nutrition education Formal process to instruct or train a patient/client in a skill or to impart knowledge to help patients/clients voluntarily manage or modify food choices and eating behavior to maintain or improve health.  Outcome: Completed/Met Date Met:  03/09/12 Met with pt and family to discuss post-op colostomy diet. Explain that the real restriction for fiber is right after surgery and that over time pt will adopt a regular diet. Encouraged pt to try small amounts of high fiber food after discharge, when ready, and then see if pt has any symptoms like gas pain or diarrhea. Answered all of their questions in length, which required >15 minutes. Pt and family expressed understanding.

## 2012-03-09 NOTE — Consult Note (Signed)
WOC consult Note Wound V.A.C. Changed today per routine.  PA, Blenda Mounts in to see wound.  Good granulation noted. Mepitel silicone dressing  used as wound contact layer, white foam, then black foam used as topper dressings. Patient tolerated 125 mmHg continuous pressure and dressing change well. WOC ostomy consult  Stoma type/location: LLQ Colostomy Stomal assessment/size: .5inch x 1 and 1/8 inch retracted stoma Peristomal assessment: intact Treatment options for stomal/peristomal skin: none indicated Output soft brown stool Ostomy pouching: 1pc. With convexity PLUS skin barrier ring:  Pouch [place] [phone])  Primitivo Gauze [place] [phone])  I will not follow closely, but will remain available to this patient, his family, the surgical and nursing teams.  Please re-consult if needed in-between visits. Thanks, Ladona Mow, MSN, RN, [facility], CWOCN [phone])

## 2012-03-09 NOTE — Progress Notes (Signed)
Nutrition Follow-up  Intervention: TPN per pharmacy. Diet advancement per MD. Encouraged gradual increased meal intake. Educated pt on diet for colostomy. Will monitor.   Diet Order: Full liquid  TPN with Clinimix E 5/15 @ 60 ml/hr.  Lipids (20% IVFE @ 10 ml/hr), multivitamins, and trace elements are provided 3 times weekly (MWF) due to national backorder.  Provides 1228 kcal and 72 grams protein daily (based on weekly average).  Meets 61% minimum estimated kcal and 63% minimum estimated protein needs.  Additional IVF with D5 NS @ 80 ml/hr.  - Met with pt who reports having steady nausea. Pt reports PTA a medication he was on, pt suspects Flagyl, caused him to lose his taste for foods. Pt c/o that food tasted "horrid and salty". Pt reports no appetite from last admission until now. Pt reports he was staying hydrated at home but his intake of foods was no more in amount than 1/4 of glass baby jar of food/day. Pt reports he would eat food and then feel full.   - Potassium WNL after being replaced - Pt with elevated triglycerides - AST elevated  - CBGs controlled - PALB low at 4.9mg /dL on 45/40  Meds: Scheduled Meds:   . insulin aspart  0-5 Units Subcutaneous QHS  . insulin aspart  0-9 Units Subcutaneous TID WC  . pantoprazole (PROTONIX) IV  40 mg Intravenous QHS  . piperacillin-tazobactam (ZOSYN)  IV  3.375 g Intravenous Q8H  . [COMPLETED] potassium chloride  10 mEq Intravenous Q1 Hr x 4  . [COMPLETED] potassium chloride  10 mEq Intravenous Q1 Hr x 2  . [DISCONTINUED] insulin aspart  0-9 Units Subcutaneous Q6H   Continuous Infusions:   . [EXPIRED] dextrose 5 % and 0.9 % NaCl with KCl 20 mEq/L 100 mL/hr at 03/07/12 1008  . dextrose 5 % and 0.9 % NaCl with KCl 20 mEq/L 80 mL/hr at 03/08/12 2003  . [EXPIRED] fat emulsion 250 mL (03/07/12 1748)  . fat emulsion    . [EXPIRED] TPN (CLINIMIX) +/- additives 40 mL/hr at 03/07/12 1748  . TPN (CLINIMIX) +/- additives 60 mL/hr at 03/08/12 1805    . TPN (CLINIMIX) +/- additives     PRN Meds:.HYDROmorphone (DILAUDID) injection, ondansetron, promethazine, sodium chloride   CMP     Component Value Date/Time   NA 136 03/09/2012 0735   K 3.5 03/09/2012 0735   CL 102 03/09/2012 0735   CO2 26 03/09/2012 0735   GLUCOSE 108* 03/09/2012 0735   BUN 6 03/09/2012 0735   CREATININE 0.66 03/09/2012 0735   CALCIUM 8.4 03/09/2012 0735   PROT 6.0 03/08/2012 0605   ALBUMIN 1.8* 03/08/2012 0605   AST 59* 03/08/2012 0605   ALT 34 03/08/2012 0605   ALKPHOS 81 03/08/2012 0605   BILITOT 0.2* 03/08/2012 0605   GFRNONAA >90 03/09/2012 0735   GFRAA >90 03/09/2012 0735   Prealbumin  Date Value Range Status  03/07/2012 4.9* 17.0 - 34.0 mg/dL Final     CBG (last 3)   Basename 03/09/12 1231 03/09/12 0603 03/08/12 2344  GLUCAP 111* 119* 112*     Intake/Output Summary (Last 24 hours) at 03/09/12 1254 Last data filed at 03/09/12 1000  Gross per 24 hour  Intake 2663.49 ml  Output   4685 ml  Net -2021.51 ml   Colostomy output - total yesterday  Weight Status:  No new weights  Estimated needs:   2000-2300 calories 115-135g protein  Nutrition Dx:  Inadequate oral intake r/t inability to eat AEB  NPO - no longer appropriate, diet advanced.  New nutrition dx: Inadequate oral intake r/t nausea and early satiety AEB pt statement.   Goal:  TPN to meet >90% of estimated nutritional needs - not met, pt with diet advancement - pharmacy monitoring the need for TPN.   Monitor: Weights, labs, intake, TPN, diet advancement, BM   Levon Hedger MS, RD, LDN [phone] Pager [phone] After Hours Pager

## 2012-03-09 NOTE — Progress Notes (Signed)
Subjective: Patient c/o abd soreness, primarily left sided; intermittent nausea, no vomiting  Objective: Vital signs in last 24 hours: Temp:  [98 F (36.7 C)-98.4 F (36.9 C)] 98.4 F (36.9 C) (11/22 0609) Pulse Rate:  [78-93] 78  (11/22 0609) Resp:  [16-18] 18  (11/22 0609) BP: (96-107)/(58-69) 107/69 mmHg (11/22 0609) SpO2:  [96 %-99 %] 98 % (11/22 0609) Last BM Date: 03/09/12  Intake/Output from previous day: 11/21 0701 - 11/22 0700 In: 3905.5 [P.O.:1080; I.V.:1364.3; IV Piggyback:10; TPN:1441.2] Out: 3610 [Urine:2400; Drains:405; Stool:805] Intake/Output this shift: Total I/O In: -  Out: 700 [Urine:700]  Left (IR) drains intact, insertion site with small amt purulent drainage; drains flushed with 5 cc's sterile NS with return of thick beige/ sl blood tinged fluid; outputs upper drain 300 cc's , lower 55 cc's.  Lab Results:   Basename 03/07/12 0530 03/06/12 1315  WBC 11.2* 14.6*  HGB 8.2* 9.9*  HCT 25.5* 30.0*  PLT 514* 591*   BMET  Basename 03/09/12 0735 03/08/12 0605  NA 136 132*  K 3.5 3.1*  CL 102 95*  CO2 26 26  GLUCOSE 108* 132*  BUN 6 8  CREATININE 0.66 0.71  CALCIUM 8.4 8.3*   PT/INR  Basename 03/07/12 1000  LABPROT 17.6*  INR 1.49   ABG No results found for this basename: PHART:2,PCO2:2,PO2:2,HCO3:2 in the last 72 hours Results for orders placed during the hospital encounter of 01/16/12  SURGICAL PCR SCREEN     Status: Normal   Collection Time   01/17/12  8:28 AM      Component Value Range Status Comment   MRSA, PCR NEGATIVE  NEGATIVE Final    Staphylococcus aureus NEGATIVE  NEGATIVE Final   CULTURE, BLOOD (ROUTINE X 2)     Status: Normal   Collection Time   01/19/12 10:46 AM      Component Value Range Status Comment   Specimen Description BLOOD LEFT ARM   Final    Special Requests BOTTLES DRAWN AEROBIC AND ANAEROBIC 10CC   Final    Culture  Setup Time 01/19/2012 16:55   Final    Culture NO GROWTH 5 DAYS   Final    Report Status  01/25/2012 FINAL   Final   CULTURE, BLOOD (ROUTINE X 2)     Status: Normal   Collection Time   01/19/12 10:48 AM      Component Value Range Status Comment   Specimen Description BLOOD LEFT ARM   Final    Special Requests BOTTLES DRAWN AEROBIC AND ANAEROBIC 10CC   Final    Culture  Setup Time 01/19/2012 16:55   Final    Culture NO GROWTH 5 DAYS   Final    Report Status 01/25/2012 FINAL   Final   CULTURE, ROUTINE-ABSCESS     Status: Normal   Collection Time   01/24/12  1:10 PM      Component Value Range Status Comment   Specimen Description PERITONEAL CAVITY   Final    Special Requests NONE   Final    Gram Stain     Final    Value: FEW WBC PRESENT, PREDOMINANTLY PMN     NO SQUAMOUS EPITHELIAL CELLS SEEN     ABUNDANT GRAM NEGATIVE RODS     ABUNDANT GRAM POSITIVE RODS     FEW GRAM VARIABLE ROD   Culture ABUNDANT ENTEROCOCCUS SPECIES   Final    Report Status 01/27/2012 FINAL   Final    Organism ID, Bacteria ENTEROCOCCUS SPECIES   Final  CULTURE, ROUTINE-ABSCESS     Status: Normal   Collection Time   01/24/12  2:07 PM      Component Value Range Status Comment   Specimen Description PERITONEAL CAVITY   Final    Special Requests NONE   Final    Gram Stain     Final    Value: RARE WBC PRESENT, PREDOMINANTLY PMN     NO SQUAMOUS EPITHELIAL CELLS SEEN     ABUNDANT GRAM POSITIVE RODS     MODERATE GRAM NEGATIVE RODS     MODERATE GRAM VARIABLE ROD   Culture ABUNDANT ENTEROCOCCUS SPECIES   Final    Report Status 01/27/2012 FINAL   Final    Organism ID, Bacteria ENTEROCOCCUS SPECIES   Final   BODY FLUID CULTURE     Status: Normal   Collection Time   01/31/12  5:41 PM      Component Value Range Status Comment   Specimen Description ABSCESS   Final    Special Requests Normal   Final    Gram Stain     Final    Value: ABUNDANT WBC PRESENT,BOTH PMN AND MONONUCLEAR     ABUNDANT GRAM NEGATIVE RODS     Gram Stain Report Called to,Read Back By and Verified With: Gram Stain Report Called to,Read  Back By and Verified With: CLARISSA STATEN RN 02/01/12 8:15AM BY MILSH   Culture FEW KLEBSIELLA PNEUMONIAE   Final    Report Status 02/03/2012 FINAL   Final    Organism ID, Bacteria KLEBSIELLA PNEUMONIAE   Final     Studies/Results: Ir Catheter Tube Change  03/07/2012  *RADIOLOGY REPORT*  Clinical Data: Recurrent, enlarging left upper abdominal abscess  1.  FLUOROSCOPIC GUIDED ABSCESS DRAINAGE CATHETER PLACEMENT 2.  FLUOROSCOPIC GUIDED ABSCESS DRAINAGE CATHETER EXCHANGE  Comparisons: CT abdomen pelvis - 03/06/2012; 02/22/2012; 02/09/2012; fluoroscopic guided access catheter placement/exchanged/upsizing - 02/22/2012; 02/03/2012  Intravenous Medications: Fentanyl 300 mcg IV; Versed 2 mg IV  Contrast: 20 ml, administered into the abscess cavity within the left upper abdominal quadrant.  Total Moderate Sedation Time: 20 minutes  Fluoroscopy Time: 4.1 minutes.  Complications: None immediate  Technique / Findings:  Informed written consent was obtained from the patient after a discussion of the risks, benefits and alternatives to treatment. Questions regarding the procedure were encouraged and answered.  A timeout was performed prior to the initiation of the procedure.  The exit site of the existing left upper abdominal drainage catheter and surrounding skin were prepped and draped in the usual sterile fashion, and a sterile drape was applied covering the operative field.  Maximum barrier sterile technique with sterile gowns and gloves were used for the procedure.  A timeout was performed prior to the initiation of the procedure.  Local anesthesia was provided with 1% lidocaine.  A small amount of contrast was injected via the existing left upper abdominal abscess drainage catheter. The external portion of the existing catheter was cut and cannulated with a Benson wire.  A 9- French vascular sheath was advanced into the fluid collection, utilized to place an additional Transport planner.  With the use of a Kumpe  catheter, a Benson wire was manipulated into the cranial aspect of the large left upper quadrant abdominal abscess.  Contrast injection was performed to delineate the cranial extent of the collection.  Under intermittent fluoroscopic guidance, the Kumpe catheter was exchanged for a new 14-French all- purpose drainage catheter with tip coiled and locked within the superior most aspect of the abscess.  Of note, the catheter could not be positioned any more cranially within the abscess given the length of this large-bore all-purpose drainage catheter.  With the use of the Kumpe catheter, with the additional [facility] wire was manipulated into the caudal aspect of the abscess. Contrast injection was performed to demarcate the caudal extent of the abscess.  Under intermittent fluoroscopic guidance, the Kumpe catheter was exchanged for an additional 14-French all-purpose drainage catheter with tip coiled and locked within the inferior/caudal extent of the abscess collection.  Both catheters were secured to the skin with interrupted sutures. A total of approximately 420 ml of purulent, foul-smelling fluid was aspirated from both of the catheters.  Both catheters were connected to a drainage bag.  Dressings were placed.  The patient tolerated the procedure well without immediate postprocedural complication.  Impression:  Successful fluoroscopic guided repositioning and placement of two 14-French all-purpose drainage catheters with one drain positioned within the cranial aspect of the abscess and the other within the caudal aspect of the abscess.  A total of approximately 420 ml of purulent, foul-smelling fluid was aspirated from both of the catheters.   Original Report Authenticated By: Tacey Ruiz, MD    Ir Era Skeen W Catheter Placement  03/07/2012  *RADIOLOGY REPORT*  Clinical Data: Recurrent, enlarging left upper abdominal abscess  1.  FLUOROSCOPIC GUIDED ABSCESS DRAINAGE CATHETER PLACEMENT 2.  FLUOROSCOPIC GUIDED  ABSCESS DRAINAGE CATHETER EXCHANGE  Comparisons: CT abdomen pelvis - 03/06/2012; 02/22/2012; 02/09/2012; fluoroscopic guided access catheter placement/exchanged/upsizing - 02/22/2012; 02/03/2012  Intravenous Medications: Fentanyl 300 mcg IV; Versed 2 mg IV  Contrast: 20 ml, administered into the abscess cavity within the left upper abdominal quadrant.  Total Moderate Sedation Time: 20 minutes  Fluoroscopy Time: 4.1 minutes.  Complications: None immediate  Technique / Findings:  Informed written consent was obtained from the patient after a discussion of the risks, benefits and alternatives to treatment. Questions regarding the procedure were encouraged and answered.  A timeout was performed prior to the initiation of the procedure.  The exit site of the existing left upper abdominal drainage catheter and surrounding skin were prepped and draped in the usual sterile fashion, and a sterile drape was applied covering the operative field.  Maximum barrier sterile technique with sterile gowns and gloves were used for the procedure.  A timeout was performed prior to the initiation of the procedure.  Local anesthesia was provided with 1% lidocaine.  A small amount of contrast was injected via the existing left upper abdominal abscess drainage catheter. The external portion of the existing catheter was cut and cannulated with a Benson wire.  A 9- French vascular sheath was advanced into the fluid collection, utilized to place an additional Transport planner.  With the use of a Kumpe catheter, a Benson wire was manipulated into the cranial aspect of the large left upper quadrant abdominal abscess.  Contrast injection was performed to delineate the cranial extent of the collection.  Under intermittent fluoroscopic guidance, the Kumpe catheter was exchanged for a new 14-French all- purpose drainage catheter with tip coiled and locked within the superior most aspect of the abscess.  Of note, the catheter could not be positioned any  more cranially within the abscess given the length of this large-bore all-purpose drainage catheter.  With the use of the Kumpe catheter, with the additional [facility] wire was manipulated into the caudal aspect of the abscess. Contrast injection was performed to demarcate the caudal extent of the abscess.  Under intermittent fluoroscopic guidance,  the Kumpe catheter was exchanged for an additional 14-French all-purpose drainage catheter with tip coiled and locked within the inferior/caudal extent of the abscess collection.  Both catheters were secured to the skin with interrupted sutures. A total of approximately 420 ml of purulent, foul-smelling fluid was aspirated from both of the catheters.  Both catheters were connected to a drainage bag.  Dressings were placed.  The patient tolerated the procedure well without immediate postprocedural complication.  Impression:  Successful fluoroscopic guided repositioning and placement of two 14-French all-purpose drainage catheters with one drain positioned within the cranial aspect of the abscess and the other within the caudal aspect of the abscess.  A total of approximately 420 ml of purulent, foul-smelling fluid was aspirated from both of the catheters.   Original Report Authenticated By: Tacey Ruiz, MD     Anti-infectives: Anti-infectives     Start     Dose/Rate Route Frequency Ordered Stop   03/06/12 1500   piperacillin-tazobactam (ZOSYN) IVPB 3.375 g     Comments: Pharmacy to check dosing      3.375 g 12.5 mL/hr over 240 Minutes Intravenous 3 times per day 03/06/12 1145            Assessment/Plan: s/p left abd abscess drains  11/20; cont drain flushes; check fluid cx's  ; rescan once drain outputs diminish or if clinical status worsens .                                           LOS: 3 days    ALLRED,D [facility] 03/09/2012

## 2012-03-09 NOTE — Progress Notes (Signed)
Physical Therapy Note  Attempted PT evaluation today. Pt declined to participate. Requested PT check back in am between 8-10. Pt also reports that he would like to have a walker for future attempts at ambulation. Will leave RW in room as well. Thanks. [name], PT [phone]

## 2012-03-09 NOTE — Progress Notes (Signed)
PARENTERAL NUTRITION CONSULT NOTE - FOLLOW UP  Pharmacy Consult for TNA Indication: Failure to thrive s/p colostomy with wound breakdown  Allergies  Allergen Reactions  . Contrast Media (Iodinated Diagnostic Agents) Other (See Comments)    Kidney failure    Patient Measurements: Height: 5\' 10"  (177.8 cm) Weight: 245 lb (111.131 kg) IBW/kg (Calculated) : 73  Adjusted Body Weight: 88kg Usual Weight: 125kg   Vital Signs: Temp: 98.4 F (36.9 C) (11/22 0609) Temp src: Oral (11/22 0609) BP: 107/69 mmHg (11/22 0609) Pulse Rate: 78  (11/22 0609) Intake/Output from previous day: 11/21 0701 - 11/22 0700 In: 3905.5 [P.O.:1080; I.V.:1364.3; IV Piggyback:10; TPN:1441.2] Out: 3610 [Urine:2400; Drains:405; Stool:805] Intake/Output from this shift: Total I/O In: -  Out: 700 [Urine:700]  Labs:  [facility] 03/07/12 1000 03/07/12 0530 03/06/12 1315  WBC -- 11.2* 14.6*  HGB -- 8.2* 9.9*  HCT -- 25.5* 30.0*  PLT -- 514* 591*  APTT 47* -- --  INR 1.49 -- --     Basename 03/09/12 0735 03/08/12 0605 03/07/12 0530 03/06/12 1315  NA 136 132* 132* --  K 3.5 3.1* 2.6* --  CL 102 95* 94* --  CO2 26 26 27  --  GLUCOSE 108* 132* 157* --  BUN 6 8 11  --  CREATININE 0.66 0.71 0.81 --  LABCREA -- -- -- --  CREAT24HRUR -- -- -- --  CALCIUM 8.4 8.3* 8.4 --  MG -- 1.9 2.0 1.9  PHOS -- 3.3 3.0 3.6  PROT -- 6.0 6.3 7.3  ALBUMIN -- 1.8* 2.0* 2.2*  AST -- 59* 20 29  ALT -- 34 24 34  ALKPHOS -- 81 90 113  BILITOT -- 0.2* 0.2* 0.4  BILIDIR -- -- -- --  IBILI -- -- -- --  PREALBUMIN -- -- 4.9* --  TRIG -- -- 234* --  CHOLHDL -- -- -- --  CHOL -- -- 143 --  Corrected calcium 10.6 (11/21) Estimated Creatinine Clearance: 153.1 ml/min (by C-G formula based on Cr of 0.66).    Medications:  Scheduled:     . insulin aspart  0-9 Units Subcutaneous Q6H  . pantoprazole (PROTONIX) IV  40 mg Intravenous QHS  . piperacillin-tazobactam (ZOSYN)  IV  3.375 g Intravenous Q8H  . [COMPLETED] potassium  chloride  10 mEq Intravenous Q1 Hr x 4   Infusions:     . [EXPIRED] dextrose 5 % and 0.9 % NaCl with KCl 20 mEq/L 100 mL/hr at 03/07/12 1008  . dextrose 5 % and 0.9 % NaCl with KCl 20 mEq/L 80 mL/hr at 03/08/12 2003  . [EXPIRED] fat emulsion 250 mL (03/07/12 1748)  . [EXPIRED] TPN (CLINIMIX) +/- additives 40 mL/hr at 03/07/12 1748  . TPN (CLINIMIX) +/- additives 60 mL/hr at 03/08/12 1805    CBG/Insulin Requirements in the past 24 hours:  CBG: 114, 98, 131, 112, 119 2 units SSI sensitive scale q6h  Current Nutrition:  Clinimix E5/20 at 72ml/hr Full liquids 11/22  Nutritional Goals:  Per RD 11/19: Kcal:2000-2300, Protein:115-135g, Fluid:2-2.3L Clinimix E5/15 at 174ml/hr will provide 120gm protein, 1910kcal average (2184 MWF, 1704 TTSS)  Assessment: 41 yo M s/p sigmoid colectomy and colostomy due to emergent colonic obstruction with multiple complications. Patient has a history of being on TPN and TF during previous admissions however patient was not discharged on any nutritional support after last admission. Patient admitted with 2 days of N/V, failure to thrive and poor wound healing. Weight lose of 30 pounds in past 2 months. CT scan 11/19 shows Left  abdominal abscess s/p CT guided drainage and drain placement 11/20. TNA per pharmacy started 11/19   Electrolytes: K+ low but improved after multiple KCl runs; Na low/normal (unable to adjust in TNA); Mg/Phos/Corrected Ca WNL   LFTs: WNL except AST mildly elevated  TGs: elevated at baseline (234 on 11/20)  Prealbumin: severely low (4.9 on 11/20)   Plan:   Continue Clinimix E5/15 at 60ml/hr - will not advance rate as appears to be doing well with diet  Continue IVF 64ml/hr (143ml/hr total)  KCl x 3 runs today  Change CBGs to ACHS, continue SSI   IV Fat Emulsion 20% on MWF d/t ongoing shortage   Multivitamins and trace elements in TNA MWF d/t ongoing shortage   Routine TNA lab panel Mondays and Thursdays   Monitor  elevated TG - will recheck Monday   Loralee Pacas, PharmD, BCPS Pager: [phone] 03/09/2012,8:31 AM

## 2012-03-09 NOTE — Progress Notes (Signed)
Improving Drains continuing to drain abscess Repeat scan next week. Continue full liquids for now. VAC changed today.  Wilmon Arms. Corliss Skains, MD, [facility]  03/09/2012 2:03 PM

## 2012-03-09 NOTE — Progress Notes (Signed)
Patient ID: [name], male   DOB: 05-26-1970, 41 y.o.   MRN: [id number]    Subjective: Feels better this morning less LLQ pain, still tender from procedure, no further "nausea".Tolerating clears w/o N/V/D ostomy out put has increased. Abdominal drains draining dark cloudy fluid. pain is well managed per patient..  Objective: Vital signs in last 24 hours: Temp:  [98 F (36.7 C)-98.4 F (36.9 C)] 98.4 F (36.9 C) (11/22 0609) Pulse Rate:  [78-93] 78  (11/22 0609) Resp:  [16-18] 18  (11/22 0609) BP: (96-107)/(58-69) 107/69 mmHg (11/22 0609) SpO2:  [96 %-99 %] 98 % (11/22 0609) Last BM Date: 03/09/12  Intake/Output from previous day: 11/21 0701 - 11/22 0700 In: 3905.5 [P.O.:1080; I.V.:1364.3; IV Piggyback:10; TPN:1441.2] Out: 3610 [Urine:2400; Drains:405; Stool:805] Intake/Output this shift: Total I/O In: -  Out: 700 [Urine:700]  General appearance: alert, cooperative, appears stated age and no distress Chest: CTA Cardiac: RRR Abdomen: abdominal drains (#1 ) (#2 55ml) ostomy (805 ml liquid stool) wound vac in place and functioning well. Extremities: warm to touch, no edema, tenderness, + pulses. VSS, afebrile Labs: pending  Lab Results:   Basename 03/07/12 0530 03/06/12 1315  WBC 11.2* 14.6*  HGB 8.2* 9.9*  HCT 25.5* 30.0*  PLT 514* 591*   BMET  Basename 03/09/12 0735 03/08/12 0605  NA 136 132*  K 3.5 3.1*  CL 102 95*  CO2 26 26  GLUCOSE 108* 132*  BUN 6 8  CREATININE 0.66 0.71  CALCIUM 8.4 8.3*   PT/INR  Basename 03/07/12 1000  LABPROT 17.6*  INR 1.49   ABG No results found for this basename: PHART:2,PCO2:2,PO2:2,HCO3:2 in the last 72 hours  Studies/Results: Ir Catheter Tube Change  03/07/2012  *RADIOLOGY REPORT*  Clinical Data: Recurrent, enlarging left upper abdominal abscess  1.  FLUOROSCOPIC GUIDED ABSCESS DRAINAGE CATHETER PLACEMENT 2.  FLUOROSCOPIC GUIDED ABSCESS DRAINAGE CATHETER EXCHANGE  Comparisons: CT abdomen pelvis - 03/06/2012;  02/22/2012; 02/09/2012; fluoroscopic guided access catheter placement/exchanged/upsizing - 02/22/2012; 02/03/2012  Intravenous Medications: Fentanyl 300 mcg IV; Versed 2 mg IV  Contrast: 20 ml, administered into the abscess cavity within the left upper abdominal quadrant.  Total Moderate Sedation Time: 20 minutes  Fluoroscopy Time: 4.1 minutes.  Complications: None immediate  Technique / Findings:  Informed written consent was obtained from the patient after a discussion of the risks, benefits and alternatives to treatment. Questions regarding the procedure were encouraged and answered.  A timeout was performed prior to the initiation of the procedure.  The exit site of the existing left upper abdominal drainage catheter and surrounding skin were prepped and draped in the usual sterile fashion, and a sterile drape was applied covering the operative field.  Maximum barrier sterile technique with sterile gowns and gloves were used for the procedure.  A timeout was performed prior to the initiation of the procedure.  Local anesthesia was provided with 1% lidocaine.  A small amount of contrast was injected via the existing left upper abdominal abscess drainage catheter. The external portion of the existing catheter was cut and cannulated with a Benson wire.  A 9- French vascular sheath was advanced into the fluid collection, utilized to place an additional Transport planner.  With the use of a Kumpe catheter, a Benson wire was manipulated into the cranial aspect of the large left upper quadrant abdominal abscess.  Contrast injection was performed to delineate the cranial extent of the collection.  Under intermittent fluoroscopic guidance, the Kumpe catheter was exchanged for a new 14-French  all- purpose drainage catheter with tip coiled and locked within the superior most aspect of the abscess.  Of note, the catheter could not be positioned any more cranially within the abscess given the length of this large-bore all-purpose  drainage catheter.  With the use of the Kumpe catheter, with the additional [facility] wire was manipulated into the caudal aspect of the abscess. Contrast injection was performed to demarcate the caudal extent of the abscess.  Under intermittent fluoroscopic guidance, the Kumpe catheter was exchanged for an additional 14-French all-purpose drainage catheter with tip coiled and locked within the inferior/caudal extent of the abscess collection.  Both catheters were secured to the skin with interrupted sutures. A total of approximately 420 ml of purulent, foul-smelling fluid was aspirated from both of the catheters.  Both catheters were connected to a drainage bag.  Dressings were placed.  The patient tolerated the procedure well without immediate postprocedural complication.  Impression:  Successful fluoroscopic guided repositioning and placement of two 14-French all-purpose drainage catheters with one drain positioned within the cranial aspect of the abscess and the other within the caudal aspect of the abscess.  A total of approximately 420 ml of purulent, foul-smelling fluid was aspirated from both of the catheters.   Original Report Authenticated By: Tacey Ruiz, MD    Ir Era Skeen W Catheter Placement  03/07/2012  *RADIOLOGY REPORT*  Clinical Data: Recurrent, enlarging left upper abdominal abscess  1.  FLUOROSCOPIC GUIDED ABSCESS DRAINAGE CATHETER PLACEMENT 2.  FLUOROSCOPIC GUIDED ABSCESS DRAINAGE CATHETER EXCHANGE  Comparisons: CT abdomen pelvis - 03/06/2012; 02/22/2012; 02/09/2012; fluoroscopic guided access catheter placement/exchanged/upsizing - 02/22/2012; 02/03/2012  Intravenous Medications: Fentanyl 300 mcg IV; Versed 2 mg IV  Contrast: 20 ml, administered into the abscess cavity within the left upper abdominal quadrant.  Total Moderate Sedation Time: 20 minutes  Fluoroscopy Time: 4.1 minutes.  Complications: None immediate  Technique / Findings:  Informed written consent was obtained from the  patient after a discussion of the risks, benefits and alternatives to treatment. Questions regarding the procedure were encouraged and answered.  A timeout was performed prior to the initiation of the procedure.  The exit site of the existing left upper abdominal drainage catheter and surrounding skin were prepped and draped in the usual sterile fashion, and a sterile drape was applied covering the operative field.  Maximum barrier sterile technique with sterile gowns and gloves were used for the procedure.  A timeout was performed prior to the initiation of the procedure.  Local anesthesia was provided with 1% lidocaine.  A small amount of contrast was injected via the existing left upper abdominal abscess drainage catheter. The external portion of the existing catheter was cut and cannulated with a Benson wire.  A 9- French vascular sheath was advanced into the fluid collection, utilized to place an additional Transport planner.  With the use of a Kumpe catheter, a Benson wire was manipulated into the cranial aspect of the large left upper quadrant abdominal abscess.  Contrast injection was performed to delineate the cranial extent of the collection.  Under intermittent fluoroscopic guidance, the Kumpe catheter was exchanged for a new 14-French all- purpose drainage catheter with tip coiled and locked within the superior most aspect of the abscess.  Of note, the catheter could not be positioned any more cranially within the abscess given the length of this large-bore all-purpose drainage catheter.  With the use of the Kumpe catheter, with the additional [facility] wire was manipulated into the caudal aspect of the  abscess. Contrast injection was performed to demarcate the caudal extent of the abscess.  Under intermittent fluoroscopic guidance, the Kumpe catheter was exchanged for an additional 14-French all-purpose drainage catheter with tip coiled and locked within the inferior/caudal extent of the abscess collection.   Both catheters were secured to the skin with interrupted sutures. A total of approximately 420 ml of purulent, foul-smelling fluid was aspirated from both of the catheters.  Both catheters were connected to a drainage bag.  Dressings were placed.  The patient tolerated the procedure well without immediate postprocedural complication.  Impression:  Successful fluoroscopic guided repositioning and placement of two 14-French all-purpose drainage catheters with one drain positioned within the cranial aspect of the abscess and the other within the caudal aspect of the abscess.  A total of approximately 420 ml of purulent, foul-smelling fluid was aspirated from both of the catheters.   Original Report Authenticated By: Tacey Ruiz, MD     Anti-infectives: Anti-infectives     Start     Dose/Rate Route Frequency Ordered Stop   03/06/12 1500   piperacillin-tazobactam (ZOSYN) IVPB 3.375 g     Comments: Pharmacy to check dosing      3.375 g 12.5 mL/hr over 240 Minutes Intravenous 3 times per day 03/06/12 1145            Assessment/Plan:  Patient Active Problem List  Diagnosis  . UTI (lower urinary tract infection)  . Constipation  . Diverticulitis of colon without hemorrhage  . Abdominal pain  . Colon obstruction  . Obesity, Class III, BMI 40-49.9 (morbid obesity)  . Acute respiratory failure with hypoxia  . Acute pulmonary edema  . Encephalopathy acute  . Tobacco abuse  . Intra-abdominal abscess, diverticular s/p surgical drainage  . Hypertriglyceridemia  Hyponatrmia, hypochloremic on TNA (asymptomatic) Leukocytosis (improving)  s/p * No surgery found * 1. Failure to thrive/wound breakdown: IV fluids, PICC line, TNA, IV antibiotics. He will require wound care and will need to be seen by the wound ostomy care nurse for a severely stenotic colostomy and large abdominal wound that is not healing well. Wound vac per wound care team (chng M,W,F)  2.PT/OT 3. Encourage IS/OOB  4. Advance diet  to full liquid  5. Follow clinical picture 6. Drain management per IR    LOS: 3 days    Golda Acre [facility] Surgery Pager [phone]  03/09/2012

## 2012-03-10 LAB — BASIC METABOLIC PANEL
BUN: 4 mg/dL — ABNORMAL LOW (ref 6–23)
Creatinine, Ser: 0.66 mg/dL (ref 0.50–1.35)
GFR calc Af Amer: >90 mL/min (ref >90)
GFR calc non Af Amer: >90 mL/min (ref >90)
Potassium: 3.4 mEq/L — ABNORMAL LOW (ref 3.5–5.1)

## 2012-03-10 LAB — CBC
MCH: 28.6 pg (ref 26.0–34.0)
MCHC: 31.8 g/dL (ref 30.0–36.0)
Platelets: 523 10*3/uL — ABNORMAL HIGH (ref 150–400)
RBC: 2.69 MIL/uL — ABNORMAL LOW (ref 4.22–5.81)

## 2012-03-10 MED ORDER — CLINIMIX E/DEXTROSE (5/15) 5 % IV SOLN
INTRAVENOUS | Status: AC
  Administered 2012-03-10: 18:00:00 via INTRAVENOUS
  Filled 2012-03-10: qty 2000

## 2012-03-10 MED ORDER — POTASSIUM CHLORIDE 10 MEQ/100ML IV SOLN
10.0000 meq | INTRAVENOUS | Status: AC
  Administered 2012-03-10 (×4): 10 meq via INTRAVENOUS
  Filled 2012-03-10 (×5): qty 100

## 2012-03-10 NOTE — Progress Notes (Signed)
Patient ID: [name], male   DOB: [birth year], 41 y.o.   MRN: [id number] [facility] Surgery Progress Note:   * No surgery found *  Subjective: Mental status is clear.  Reviewed his history of interventions since surgery for diverticulitis Objective: Vital signs in last 24 hours: Temp:  [97.7 F (36.5 C)-98.8 F (37.1 C)] 97.7 F (36.5 C) (11/23 0536) Pulse Rate:  [87-94] 87  (11/23 0536) Resp:  [18] 18  (11/23 0536) BP: (111-118)/(70-78) 118/78 mmHg (11/23 0536) SpO2:  [96 %-98 %] 98 % (11/23 0536)  Intake/Output from previous day: 11/22 0701 - 11/23 0700 In: 3920 [P.O.:720; I.V.:1520; IV Piggyback:110; TPN:1560] Out: 4600 [Urine:4300; Drains:50; Stool:250] Intake/Output this shift:    Physical Exam: Work of breathing is not labored.  Left drains are passive and contain bloody brown drainage-possibly enteric contents.  Feeling better with new drains  Lab Results:  Results for orders placed during the hospital encounter of 03/06/12 (from the past 48 hour(s))  GLUCOSE, CAPILLARY     Status: Abnormal   Collection Time   03/08/12 12:32 PM      Component Value Range Comment   Glucose-Capillary 114 (*) 70 - 99 mg/dL   GLUCOSE, CAPILLARY     Status: Normal   Collection Time   03/08/12  4:19 PM      Component Value Range Comment   Glucose-Capillary 98  70 - 99 mg/dL   GLUCOSE, CAPILLARY     Status: Abnormal   Collection Time   03/08/12  5:51 PM      Component Value Range Comment   Glucose-Capillary 131 (*) 70 - 99 mg/dL   GLUCOSE, CAPILLARY     Status: Abnormal   Collection Time   03/08/12 11:44 PM      Component Value Range Comment   Glucose-Capillary 112 (*) 70 - 99 mg/dL    Comment 1 Documented in Chart      Comment 2 Notify RN     GLUCOSE, CAPILLARY     Status: Abnormal   Collection Time   03/09/12  6:03 AM      Component Value Range Comment   Glucose-Capillary 119 (*) 70 - 99 mg/dL    Comment 1 Documented in Chart      Comment 2 Notify RN     BASIC  METABOLIC PANEL     Status: Abnormal   Collection Time   03/09/12  7:35 AM      Component Value Range Comment   Sodium 136  135 - 145 mEq/L    Potassium 3.5  3.5 - 5.1 mEq/L    Chloride 102  96 - 112 mEq/L    CO2 26  19 - 32 mEq/L    Glucose, Bld 108 (*) 70 - 99 mg/dL    BUN 6  6 - 23 mg/dL    Creatinine, Ser 1.19  0.50 - 1.35 mg/dL    Calcium 8.4  8.4 - 14.7 mg/dL    GFR calc non Af Amer >90  >90 mL/min    GFR calc Af Amer >90  >90 mL/min   GLUCOSE, CAPILLARY     Status: Abnormal   Collection Time   03/09/12 12:31 PM      Component Value Range Comment   Glucose-Capillary 111 (*) 70 - 99 mg/dL   BODY FLUID CULTURE     Status: Normal (Preliminary result)   Collection Time   03/09/12  3:59 PM      Component Value Range Comment  Specimen Description PERITONEAL CAVITY      Special Requests Normal      Gram Stain        Value: ABUNDANT WBC PRESENT, PREDOMINANTLY PMN     RARE GRAM POSITIVE RODS     Gram Stain Report Called to,Read Back By and Verified With: Gram Stain Report Called to,Read Back By and Verified With: JESS GOOD RN @5 :45 ON 03/10/12 BY MOSSP   Culture PENDING      Report Status PENDING     GLUCOSE, CAPILLARY     Status: Abnormal   Collection Time   03/09/12  5:15 PM      Component Value Range Comment   Glucose-Capillary 110 (*) 70 - 99 mg/dL   GLUCOSE, CAPILLARY     Status: Abnormal   Collection Time   03/09/12 11:54 PM      Component Value Range Comment   Glucose-Capillary 123 (*) 70 - 99 mg/dL    Comment 1 Notify RN     BASIC METABOLIC PANEL     Status: Abnormal   Collection Time   03/10/12  3:58 AM      Component Value Range Comment   Sodium 139  135 - 145 mEq/L    Potassium 3.4 (*) 3.5 - 5.1 mEq/L    Chloride 105  96 - 112 mEq/L    CO2 25  19 - 32 mEq/L    Glucose, Bld 115 (*) 70 - 99 mg/dL    BUN 4 (*) 6 - 23 mg/dL    Creatinine, Ser 3.08  0.50 - 1.35 mg/dL    Calcium 8.4  8.4 - 65.7 mg/dL    GFR calc non Af Amer >90  >90 mL/min    GFR calc Af Amer  >90  >90 mL/min   CBC     Status: Abnormal   Collection Time   03/10/12  3:58 AM      Component Value Range Comment   WBC 8.7  4.0 - 10.5 K/uL    RBC 2.69 (*) 4.22 - 5.81 MIL/uL    Hemoglobin 7.7 (*) 13.0 - 17.0 g/dL    HCT 84.6 (*) 96.2 - 52.0 %    MCV 90.0  78.0 - 100.0 fL    MCH 28.6  26.0 - 34.0 pg    MCHC 31.8  30.0 - 36.0 g/dL    RDW 95.2  84.1 - 32.4 %    Platelets 523 (*) 150 - 400 K/uL   GLUCOSE, CAPILLARY     Status: Abnormal   Collection Time   03/10/12  7:51 AM      Component Value Range Comment   Glucose-Capillary 130 (*) 70 - 99 mg/dL     Radiology/Results: No results found.  Anti-infectives: Anti-infectives     Start     Dose/Rate Route Frequency Ordered Stop   03/06/12 1500  piperacillin-tazobactam (ZOSYN) IVPB 3.375 g    Comments: Pharmacy to check dosing     3.375 g 12.5 mL/hr over 240 Minutes Intravenous 3 times per day 03/06/12 1145            Assessment/Plan: Problem List: Patient Active Problem List  Diagnosis  . UTI (lower urinary tract infection)  . Constipation  . Diverticulitis of colon without hemorrhage  . Abdominal pain  . Colon obstruction  . Obesity, Class III, BMI 40-49.9 (morbid obesity)  . Acute respiratory failure with hypoxia  . Acute pulmonary edema  . Encephalopathy acute  . Tobacco abuse  . Intra-abdominal abscess, diverticular  s/p surgical drainage  . Hypertriglyceridemia    Continue clears and observation * No surgery found *    LOS: 4 days   Matt B. Daphine Deutscher, MD, [facility] [phone] beeper [phone]  03/10/2012 8:51 AM

## 2012-03-10 NOTE — Evaluation (Signed)
Physical Therapy Evaluation Patient Details Name: [name] MRN: [id number] DOB: May 16, 1970 Today's Date: 03/10/2012 Time: 4098-1191 PT Time Calculation (min): 24 min  PT Assessment / Plan / Recommendation Clinical Impression  Pt is a 41 yr old male who had a prolonged hospital stay due to perforated colon with complicated course due to abscesses and ileus.  He was discharged on 10/27.  He presented with  2 days of nausea and vomiting and feeling quite poor and weak. He has had significant drainage from around the colostomy site.  Pt now has wound vac on abdomen.  Tolerated OOB and ambulation in hallway, however mobility is limited due to increased pain at wound vac site.  Pt will benefit from skilled PT in acute venue to address deficits.  PT recommends HHPT for follow up at D/C to maximize pts independence.     PT Assessment  Patient needs continued PT services    Follow Up Recommendations  Home health PT    Does the patient have the potential to tolerate intense rehabilitation      Barriers to Discharge None      Equipment Recommendations  Rolling walker with 5" wheels (bari walker)    Recommendations for Other Services     Frequency Min 3X/week    Precautions / Restrictions Precautions Precautions: Fall Restrictions Weight Bearing Restrictions: No   Pertinent Vitals/Pain 8/10 pain, RN notified.       Mobility  Bed Mobility Bed Mobility: Sit to Supine;Sitting - Scoot to Edge of Bed Sitting - Scoot to Edge of Bed: 5: Supervision Sit to Supine: 5: Supervision;HOB flat Details for Bed Mobility Assistance: Pt able to get B LEs into bed with supervision for safety and min cues for technique/safety.  Transfers Transfers: Sit to Stand;Stand to Sit Sit to Stand: 4: Min guard;With upper extremity assist;From bed Stand to Sit: 4: Min guard;With upper extremity assist;With armrests;To chair/3-in-1 Details for Transfer Assistance: Min/guard for safety with mod cues for  hand placement and safety when sitting/standing due to pt tendency to grab front of RW to get up .  Ambulation/Gait Ambulation/Gait Assistance: 4: Min guard Ambulation Distance (Feet): 100 Feet Assistive device: Rolling walker Ambulation/Gait Assistance Details: Min/guard for safety with cues for upright posture (difficult due to pain in abdomen) and to maintain position inside of RW.  Gait Pattern: Step-through pattern;Decreased stride length;Trunk flexed Gait velocity: decreased Stairs: No Wheelchair Mobility Wheelchair Mobility: No    Shoulder Instructions     Exercises     PT Diagnosis: Difficulty walking;Generalized weakness;Acute pain  PT Problem List: Decreased strength;Decreased activity tolerance;Decreased balance;Decreased mobility;Decreased knowledge of use of DME;Decreased safety awareness;Obesity;Pain PT Treatment Interventions: DME instruction;Gait training;Functional mobility training;Therapeutic activities;Therapeutic exercise;Balance training;Patient/family education   PT Goals Acute Rehab PT Goals PT Goal Formulation: With patient Time For Goal Achievement: 03/24/12 Potential to Achieve Goals: Good Pt will go Supine/Side to Sit: with modified independence PT Goal: Supine/Side to Sit - Progress: Goal set today Pt will go Sit to Stand: with modified independence PT Goal: Sit to Stand - Progress: Goal set today Pt will go Stand to Sit: with modified independence PT Goal: Stand to Sit - Progress: Goal set today Pt will Ambulate: 51 - 150 feet;with modified independence;with least restrictive assistive device PT Goal: Ambulate - Progress: Goal set today Pt will Perform Home Exercise Program: with supervision, verbal cues required/provided PT Goal: Perform Home Exercise Program - Progress: Goal set today  Visit Information  Last PT Received On: 03/10/12 Assistance Needed: +1  Subjective Data  Subjective: I'm hurting more with this wound vac that I did with just  the binder.  Patient Stated Goal: to return home    Prior Functioning  Home Living Lives With: Spouse Available Help at Discharge: Family;Available 24 hours/day Type of Home: House Home Layout: One level Bathroom Shower/Tub: Tub/shower unit (been sponge bathing) Bathroom Toilet: Standard Home Adaptive Equipment: Straight cane Prior Function Level of Independence: Needs assistance Needs Assistance: Dressing;Bathing Bath: Moderate Dressing: Total Able to Take Stairs?: Yes Driving: No Comments: Pt states that wife does most self care acitivties by choice to help pt, not because pt is unable.  Communication Communication: No difficulties    Cognition  Overall Cognitive Status: Appears within functional limits for tasks assessed/performed Arousal/Alertness: Awake/alert Orientation Level: Appears intact for tasks assessed Behavior During Session: [facility] for tasks performed    Extremity/Trunk Assessment Right Lower Extremity Assessment RLE ROM/Strength/Tone: [facility] for tasks assessed Left Lower Extremity Assessment LLE ROM/Strength/Tone: [facility] for tasks assessed Trunk Assessment Trunk Assessment: Kyphotic   Balance    End of Session PT - End of Session Activity Tolerance: Patient limited by pain Patient left: in bed;with call bell/phone within reach;with family/visitor present Nurse Communication: Mobility status;Patient requests pain meds  GP     Page, Meribeth Mattes 03/10/2012, 12:05 PM

## 2012-03-10 NOTE — Progress Notes (Signed)
PARENTERAL NUTRITION CONSULT NOTE - FOLLOW UP  Pharmacy Consult for TNA Indication: Failure to thrive s/p colostomy with wound breakdown  Allergies  Allergen Reactions  . Contrast Media (Iodinated Diagnostic Agents) Other (See Comments)    Kidney failure    Patient Measurements: Height: 5\' 10"  (177.8 cm) Weight: 245 lb (111.131 kg) IBW/kg (Calculated) : 73  Adjusted Body Weight: 88kg Usual Weight: 125kg   Vital Signs: Temp: 97.7 F (36.5 C) (11/23 0536) Temp src: Oral (11/23 0536) BP: 118/78 mmHg (11/23 0536) Pulse Rate: 87  (11/23 0536) Intake/Output from previous day: 11/22 0701 - 11/23 0700 In: 3920 [P.O.:720; I.V.:1520; IV Piggyback:110; TPN:1560] Out: 4600 [Urine:4300; Drains:50; Stool:250] Intake/Output from this shift: Total I/O In: 1520 [I.V.:560; Other:10; IV Piggyback:110; TPN:840] Out: 2050 [Urine:1900; Stool:150]  Labs:  [facility] 03/10/12 0358 03/07/12 1000  WBC 8.7 --  HGB 7.7* --  HCT 24.2* --  PLT 523* --  APTT -- 47*  INR -- 1.49     Basename 03/10/12 0358 03/09/12 0735 03/08/12 0605  NA 139 136 132*  K 3.4* 3.5 3.1*  CL 105 102 95*  CO2 25 26 26   GLUCOSE 115* 108* 132*  BUN 4* 6 8  CREATININE 0.66 0.66 0.71  LABCREA -- -- --  CREAT24HRUR -- -- --  CALCIUM 8.4 8.4 8.3*  MG -- -- 1.9  PHOS -- -- 3.3  PROT -- -- 6.0  ALBUMIN -- -- 1.8*  AST -- -- 59*  ALT -- -- 34  ALKPHOS -- -- 81  BILITOT -- -- 0.2*  BILIDIR -- -- --  IBILI -- -- --  PREALBUMIN -- -- --  TRIG -- -- --  CHOLHDL -- -- --  CHOL -- -- --  Corrected calcium 10.6 (11/21) Estimated Creatinine Clearance: 153.1 ml/min (by C-G formula based on Cr of 0.66).    Medications:  Scheduled:     . insulin aspart  0-5 Units Subcutaneous QHS  . insulin aspart  0-9 Units Subcutaneous TID WC  . pantoprazole (PROTONIX) IV  40 mg Intravenous QHS  . piperacillin-tazobactam (ZOSYN)  IV  3.375 g Intravenous Q8H  . [COMPLETED] potassium chloride  10 mEq Intravenous Q1 Hr x 2  .  [DISCONTINUED] insulin aspart  0-9 Units Subcutaneous Q6H   Infusions:     . dextrose 5 % and 0.9 % NaCl with KCl 20 mEq/L 80 mL/hr at 03/09/12 2211  . fat emulsion 250 mL (03/09/12 1801)  . [EXPIRED] TPN (CLINIMIX) +/- additives 60 mL/hr at 03/08/12 1805  . TPN (CLINIMIX) +/- additives 60 mL/hr at 03/09/12 1801    CBG/Insulin Requirements in the past 24 hours:  0 units SSI sensitive scale q6h  Current Nutrition:  Clinimix E5/20 at 88ml/hr Full liquids 11/22  Nutritional Goals:  Per RD 11/19: Kcal:2000-2300, Protein:115-135g, Fluid:2-2.3L Clinimix E5/15 at 196ml/hr will provide 120gm protein, 1910kcal average (2184 MWF, 1704 TTSS)  Assessment: 41 yo M s/p sigmoid colectomy and colostomy due to emergent colonic obstruction with multiple complications. Patient has a history of being on TPN and TF during previous admissions however patient was not discharged on any nutritional support after last admission. Patient admitted with 2 days of N/V, failure to thrive and poor wound healing. Weight lose of 30 pounds in past 2 months. CT scan 11/19 shows Left abdominal abscess s/p CT guided drainage and drain placement 11/20. TNA per pharmacy started 11/19.  Patient is tolerating 100% of CLD at this time   Electrolytes: potassium low, other electrolytes WNL  Mg/Phos/Corrected  Ca WNL   LFTs: WNL except AST mildly elevated  TGs: elevated at baseline (234 on 11/20)  Prealbumin: severely low (4.9 on 11/20)  CBG's at goal < 150   Plan:   Continue Clinimix E5/15 at 50ml/hr - will not advance rate as appears to be doing well with diet  Continue IVF 43ml/hr (125ml/hr total)  KCl x 4 runs today  Change CBGs to ACHS, continue SSI   IV Fat Emulsion 20% on MWF d/t ongoing shortage   Multivitamins and trace elements in TNA MWF d/t ongoing shortage   Routine TNA lab panel Mondays and Thursdays   Monitor elevated TG - will recheck Monday  Clydene Fake PharmD Pager #:  [phone] 6:46 AM 03/10/2012

## 2012-03-11 LAB — GLUCOSE, CAPILLARY
Glucose-Capillary: 171 mg/dL — ABNORMAL HIGH (ref 70–99)
Glucose-Capillary: 112 mg/dL — ABNORMAL HIGH (ref 70–99)
Glucose-Capillary: 117 mg/dL — ABNORMAL HIGH (ref 70–99)

## 2012-03-11 MED ORDER — CLINIMIX E/DEXTROSE (5/15) 5 % IV SOLN
INTRAVENOUS | Status: AC
  Administered 2012-03-11: 18:00:00 via INTRAVENOUS
  Filled 2012-03-11: qty 2000

## 2012-03-11 NOTE — Progress Notes (Signed)
  Subjective: Left abd abscess drains placed 11/20 Still with good output from upper drain Lower drain minimal Pt feels better  Objective: Vital signs in last 24 hours: Temp:  [97.7 F (36.5 C)-98.4 F (36.9 C)] 98.4 F (36.9 C) (11/24 0515) Pulse Rate:  [80-87] 81  (11/24 0515) Resp:  [18] 18  (11/24 0515) BP: (110-130)/(72-83) 130/83 mmHg (11/24 0515) SpO2:  [97 %-98 %] 98 % (11/24 0515) Last BM Date: 03/10/12 (l quad osotmy)  Intake/Output from previous day: 11/23 0701 - 11/24 0700 In: 4523.3 [P.O.:720; I.V.:1913.3; IV Piggyback:550; TPN:1320] Out: 3390 [Urine:3050; Drains:90; Stool:250] Intake/Output this shift:    PE:  Afeb; VSS Drains intact Output minimal from lower drain: 10 cc 24 hrs 100 cc yesterday from upper drain: purulent/ layers 100 cc in bag now Cx: no growth Wbc wnl Site  With some redness; sl tender clean   Lab Results:   [facility] 03/10/12 0358  WBC 8.7  HGB 7.7*  HCT 24.2*  PLT 523*   BMET  Basename 03/10/12 0358 03/09/12 0735  NA 139 136  K 3.4* 3.5  CL 105 102  CO2 25 26  GLUCOSE 115* 108*  BUN 4* 6  CREATININE 0.66 0.66  CALCIUM 8.4 8.4   PT/INR No results found for this basename: LABPROT:2,INR:2 in the last 72 hours ABG No results found for this basename: PHART:2,PCO2:2,PO2:2,HCO3:2 in the last 72 hours  Studies/Results: No results found.  Anti-infectives: Anti-infectives     Start     Dose/Rate Route Frequency Ordered Stop   03/06/12 1500   piperacillin-tazobactam (ZOSYN) IVPB 3.375 g     Comments: Pharmacy to check dosing      3.375 g 12.5 mL/hr over 240 Minutes Intravenous 3 times per day 03/06/12 1145            Assessment/Plan: s/p * No surgery found *  Left abd abscess drains placed 11/22 Will follow Will ned Re CT when output minimal from both Plan per [facility]   LOS: 5 days    Caridad Silveira A 03/11/2012

## 2012-03-11 NOTE — Progress Notes (Signed)
PARENTERAL NUTRITION CONSULT NOTE - FOLLOW UP  Pharmacy Consult for TNA Indication: Failure to thrive s/p colostomy with wound breakdown  Allergies  Allergen Reactions  . Contrast Media (Iodinated Diagnostic Agents) Other (See Comments)    Kidney failure    Patient Measurements: Height: 5\' 10"  (177.8 cm) Weight: 245 lb (111.131 kg) IBW/kg (Calculated) : 73  Adjusted Body Weight: 88kg Usual Weight: 125kg   Vital Signs: Temp: 98 F (36.7 C) (11/23 2130) Temp src: Oral (11/23 2130) BP: 115/77 mmHg (11/23 2130) Pulse Rate: 80  (11/23 2130) Intake/Output from previous day: 11/23 0701 - 11/24 0700 In: 4523.3 [P.O.:720; I.V.:1913.3; IV Piggyback:550; TPN:1320] Out: 3390 [Urine:3050; Drains:90; Stool:250] Intake/Output from this shift: Total I/O In: 1553.3 [I.V.:953.3; Other:20; IV Piggyback:100; TPN:480] Out: 1350 [Urine:1300; Drains:50]  Labs:  [facility] 03/10/12 0358  WBC 8.7  HGB 7.7*  HCT 24.2*  PLT 523*  APTT --  INR --     Basename 03/10/12 0358 03/09/12 0735 03/08/12 0605  NA 139 136 132*  K 3.4* 3.5 3.1*  CL 105 102 95*  CO2 25 26 26   GLUCOSE 115* 108* 132*  BUN 4* 6 8  CREATININE 0.66 0.66 0.71  LABCREA -- -- --  CREAT24HRUR -- -- --  CALCIUM 8.4 8.4 8.3*  MG -- -- 1.9  PHOS -- -- 3.3  PROT -- -- 6.0  ALBUMIN -- -- 1.8*  AST -- -- 59*  ALT -- -- 34  ALKPHOS -- -- 81  BILITOT -- -- 0.2*  BILIDIR -- -- --  IBILI -- -- --  PREALBUMIN -- -- --  TRIG -- -- --  CHOLHDL -- -- --  CHOL -- -- --  Corrected calcium 10.6 (11/21) Estimated Creatinine Clearance: 153.1 ml/min (by C-G formula based on Cr of 0.66).    Medications:  Scheduled:     . insulin aspart  0-5 Units Subcutaneous QHS  . insulin aspart  0-9 Units Subcutaneous TID WC  . pantoprazole (PROTONIX) IV  40 mg Intravenous QHS  . piperacillin-tazobactam (ZOSYN)  IV  3.375 g Intravenous Q8H  . [COMPLETED] potassium chloride  10 mEq Intravenous Q1 Hr x 4   Infusions:     . dextrose 5  % and 0.9 % NaCl with KCl 20 mEq/L 80 mL/hr at 03/11/12 0515  . [EXPIRED] fat emulsion 250 mL (03/09/12 1801)  . [EXPIRED] TPN (CLINIMIX) +/- additives 60 mL/hr at 03/09/12 1801  . TPN (CLINIMIX) +/- additives 60 mL/hr at 03/10/12 1751    CBG/Insulin Requirements in the past 24 hours:  0 units SSI sensitive scale q6h  Current Nutrition:  Clinimix E5/20 at 33ml/hr Full liquids 11/22  Nutritional Goals:  Per RD 11/19: Kcal:2000-2300, Protein:115-135g, Fluid:2-2.3L Clinimix E5/15 at 146ml/hr will provide 120gm protein, 1910kcal average (2184 MWF, 1704 TTSS)  Assessment: 41 yo M s/p sigmoid colectomy and colostomy due to emergent colonic obstruction with multiple complications. Patient has a history of being on TPN and TF during previous admissions however patient was not discharged on any nutritional support after last admission. Patient admitted with 2 days of N/V, failure to thrive and poor wound healing. Weight lose of 30 pounds in past 2 months. CT scan 11/19 shows Left abdominal abscess s/p CT guided drainage and drain placement 11/20. TNA per pharmacy started 11/19.  Patient is tolerating 100% of CLD at this time   Electrolytes: potassium low, other electrolytes WNL  Mg/Phos/Corrected Ca WNL   LFTs: WNL except AST mildly elevated  TGs: elevated at baseline (234 on  11/20)  Prealbumin: severely low (4.9 on 11/20)  CBG's at goal < 150   Plan:   Continue Clinimix E5/15 at 5ml/hr - will not advance rate as appears to be doing well with diet  Continue IVF 22ml/hr (165ml/hr total)  Change CBGs to ACHS, continue SSI   IV Fat Emulsion 20% on MWF d/t ongoing shortage   Multivitamins and trace elements in TNA MWF d/t ongoing shortage   Routine TNA lab panel Mondays and Thursdays   Monitor elevated TG - will recheck Monday  Clydene Fake PharmD Pager #: [phone] 6:04 AM 03/11/2012

## 2012-03-11 NOTE — Progress Notes (Signed)
Patient ID: [name], male   DOB: August 07, 1970, 41 y.o.   MRN: [id number]    Subjective: No complaints today. Minimal discomfort. Tolerating his full liquid diet without difficulty.  Objective: Vital signs in last 24 hours: Temp:  [97.7 F (36.5 C)-98.4 F (36.9 C)] 98.4 F (36.9 C) (11/24 0515) Pulse Rate:  [80-87] 81  (11/24 0515) Resp:  [18] 18  (11/24 0515) BP: (110-130)/(72-83) 130/83 mmHg (11/24 0515) SpO2:  [97 %-98 %] 98 % (11/24 0515) Last BM Date: 03/10/12 (l quad osotmy)  Intake/Output from previous day: 11/23 0701 - 11/24 0700 In: 4523.3 [P.O.:720; I.V.:1913.3; IV Piggyback:550; TPN:1320] Out: 3390 [Urine:3050; Drains:90; Stool:250] Intake/Output this shift:    General appearance: alert, cooperative and no distress GI: abdomen is generally soft and nontender. Percutaneous drains his left flank. One has a small amount of purulent drainage. The other a larger amount with cloudy fluid and layering debris. Incision/Wound: VAC dressing in place and intact without erythema  Lab Results:   [facility] 03/10/12 0358  WBC 8.7  HGB 7.7*  HCT 24.2*  PLT 523*   BMET  Basename 03/10/12 0358 03/09/12 0735  NA 139 136  K 3.4* 3.5  CL 105 102  CO2 25 26  GLUCOSE 115* 108*  BUN 4* 6  CREATININE 0.66 0.66  CALCIUM 8.4 8.4     Studies/Results: No results found.  Anti-infectives: Anti-infectives     Start     Dose/Rate Route Frequency Ordered Stop   03/06/12 1500  piperacillin-tazobactam (ZOSYN) IVPB 3.375 g    Comments: Pharmacy to check dosing     3.375 g 12.5 mL/hr over 240 Minutes Intravenous 3 times per day 03/06/12 1145            Assessment/Plan: Status post Hartman's colectomy for perforated diverticulitis and readmissions for recurrent large left abdominal abscess. He currently is clearly improving with antibiotics and repeat percutaneous drainage. It is not clear he's had such a large recurrent abscess. There is no definite fistula on CT but I  think at some point he will need a drain injection to look for an intestinal fistula. Patient is hungry and tolerating full liquids. I will advance to a regular diet.    LOS: 5 days    Mariacristina Aday T 03/11/2012

## 2012-03-12 ENCOUNTER — Ambulatory Visit (HOSPITAL_COMMUNITY): Payer: Medicaid Other

## 2012-03-12 LAB — CBC
HCT: 24.1 % — ABNORMAL LOW (ref 39.0–52.0)
MCV: 91.3 fL (ref 78.0–100.0)
RBC: 2.64 MIL/uL — ABNORMAL LOW (ref 4.22–5.81)
WBC: 9 10*3/uL (ref 4.0–10.5)

## 2012-03-12 LAB — GLUCOSE, CAPILLARY
Glucose-Capillary: 124 mg/dL — ABNORMAL HIGH (ref 70–99)
Glucose-Capillary: 116 mg/dL — ABNORMAL HIGH (ref 70–99)

## 2012-03-12 LAB — COMPREHENSIVE METABOLIC PANEL
ALT: 41 U/L (ref 0–53)
AST: 39 U/L — ABNORMAL HIGH (ref 0–37)
CO2: 25 mEq/L (ref 19–32)
Calcium: 8.4 mg/dL (ref 8.4–10.5)
Potassium: 3.4 mEq/L — ABNORMAL LOW (ref 3.5–5.1)
Sodium: 137 mEq/L (ref 135–145)
Total Protein: 5.7 g/dL — ABNORMAL LOW (ref 6.0–8.3)

## 2012-03-12 LAB — PREALBUMIN: Prealbumin: 15.8 mg/dL — ABNORMAL LOW (ref 17.0–34.0)

## 2012-03-12 LAB — DIFFERENTIAL
Basophils Relative: 1 % (ref 0–1)
Eosinophils Relative: 7 % — ABNORMAL HIGH (ref 0–5)
Lymphs Abs: 1.8 10*3/uL (ref 0.7–4.0)
Monocytes Relative: 8 % (ref 3–12)
Neutro Abs: 5.8 10*3/uL (ref 1.7–7.7)

## 2012-03-12 LAB — TRIGLYCERIDES: Triglycerides: 363 mg/dL — ABNORMAL HIGH (ref <150)

## 2012-03-12 MED ORDER — PANTOPRAZOLE SODIUM 40 MG PO TBEC
40.0000 mg | DELAYED_RELEASE_TABLET | Freq: Every day | ORAL | Status: DC
  Administered 2012-03-12 – 2012-03-16 (×5): 40 mg via ORAL
  Filled 2012-03-12 (×5): qty 1

## 2012-03-12 MED ORDER — POTASSIUM CHLORIDE CRYS ER 20 MEQ PO TBCR
20.0000 meq | EXTENDED_RELEASE_TABLET | Freq: Two times a day (BID) | ORAL | Status: AC
  Administered 2012-03-12 (×2): 20 meq via ORAL
  Filled 2012-03-12 (×2): qty 1

## 2012-03-12 MED ORDER — OXYCODONE-ACETAMINOPHEN 5-325 MG PO TABS
1.0000 | ORAL_TABLET | ORAL | Status: DC | PRN
  Administered 2012-03-12 – 2012-03-16 (×17): 2 via ORAL
  Filled 2012-03-12 (×19): qty 2

## 2012-03-12 NOTE — Progress Notes (Signed)
PARENTERAL NUTRITION CONSULT NOTE   Pharmacy Consult for TNA Indication: Intra-abdominal abscess, diverticular, s/p surgical drainage  Allergies  Allergen Reactions  . Contrast Media (Iodinated Diagnostic Agents) Other (See Comments)    Kidney failure    Patient Measurements: Height: 5\' 10"  (177.8 cm) Weight: 245 lb (111.131 kg) IBW/kg (Calculated) : 73  Adjusted Body Weight: 88kg Usual Weight: 125kg   Vital Signs: Temp: 98.2 F (36.8 C) (11/25 0545) Temp src: Oral (11/25 0545) BP: 118/75 mmHg (11/25 0545) Pulse Rate: 84  (11/25 0545) Intake/Output from previous day: 11/24 0701 - 11/25 0700 In: 4536 [P.O.:480; I.V.:1972; IV Piggyback:150; TPN:1914] Out: 2610 [Urine:2025; Drains:185; Stool:400] Intake/Output from this shift:    Labs:  [facility] 03/12/12 0435 03/10/12 0358  WBC 9.0 8.7  HGB 7.6* 7.7*  HCT 24.1* 24.2*  PLT 545* 523*  APTT -- --  INR -- --     [facility] 03/12/12 0435 03/10/12 0358  NA 137 139  K 3.4* 3.4*  CL 106 105  CO2 25 25  GLUCOSE 155* 115*  BUN 7 4*  CREATININE 0.74 0.66  LABCREA -- --  CREAT24HRUR -- --  CALCIUM 8.4 8.4  MG 1.8 --  PHOS 3.3 --  PROT 5.7* --  ALBUMIN 1.9* --  AST 39* --  ALT 41 --  ALKPHOS 89 --  BILITOT 0.2* --  BILIDIR -- --  IBILI -- --  PREALBUMIN -- --  TRIG 363* --  CHOLHDL -- --  CHOL 141 --  Corrected calcium = 10.1 Estimated Creatinine Clearance: 153.1 ml/min (by C-G formula based on Cr of 0.74).    Medications:  Scheduled:     . insulin aspart  0-5 Units Subcutaneous QHS  . insulin aspart  0-9 Units Subcutaneous TID WC  . pantoprazole (PROTONIX) IV  40 mg Intravenous QHS  . piperacillin-tazobactam (ZOSYN)  IV  3.375 g Intravenous Q8H   Infusions:     . dextrose 5 % and 0.9 % NaCl with KCl 20 mEq/L 80 mL/hr at 03/12/12 0634  . [EXPIRED] TPN (CLINIMIX) +/- additives 60 mL/hr at 03/10/12 1751  . TPN (CLINIMIX) +/- additives 60 mL/hr at 03/11/12 1806    CBG/Insulin Requirements in the  past 24 hours:  CBGs 112 - 171 3 units SSI Novolog sensitive scale q6h  Current Nutrition:  Clinimix E-5/15 at 27ml/hr Dysphagia III diet  Nutritional Goals:  Per RD 11/19: Kcal:2000-2300, Protein:115-135g, Fluid:2-2.3L Clinimix E-5/15 at 133ml/hr  provides 120gm protein, 1910kcal average (2184 MWF, 1704 TTSS)  Assessment: 41 yo M started on TNA per pharmacy 11/19 after admission for abdominal abscess, FTT, and poor wound healing following emergent partial colectomy and colostomy 10/1 due to due to colonic obstruction with multiple complications.   Transition from TNA to PO feeds in progress.  Order received to wean off TNA today.  Electrolytes: K slightly low, other electrolytes WNL.  LFTs: WNL except AST mildly elevated.  TGs: elevated at baseline, now rising.  Prealbumin: severely low at baseline (4.9 on 11/20); today's value pending.  CBGs acceptable.  Tolerating Dysphagia III diet - advancing to regular diet today.   Plan:  1.  Reduce TPN rate to 40 mL/hr now, then discontinue at 1800hr. 2.  KDur PO BID today - all further electrolyte management by MD and treatment team. 3.  BMet tomorrow. 4.  DC CBGs, sliding scale coverage, and TNA labs. 5.  Convert Protonix to PO route for now; await assessment by MD/treatment team on whether continued PPI therapy is indicated.  Elie Goody, PharmD, BCPS Pager: [phone] 03/12/2012  10:05 AM

## 2012-03-12 NOTE — Progress Notes (Signed)
Physical Therapy Treatment Patient Details Name: KIMBERLEY DASTRUP MRN: [id number] DOB: [birth year] Today's Date: 03/12/2012 Time: 4098-1191 PT Time Calculation (min): 19 min  PT Assessment / Plan / Recommendation Comments on Treatment Session  Pt premedicated prior to therapy and did well with ambulation today.  Pt reports he does not like sitting position due to pain at wound vac site and prefers either ambulation or supine.  Pt likely will only need one more visit to ambulate again and education on standing exercises which pt agreeable for next visit.    Follow Up Recommendations  Home health PT     Does the patient have the potential to tolerate intense rehabilitation     Barriers to Discharge        Equipment Recommendations  Rolling walker with 5" wheels (bari walker)    Recommendations for Other Services    Frequency     Plan Discharge plan remains appropriate    Precautions / Restrictions Precautions Precautions: None   Pertinent Vitals/Pain Pt reports slight pain at wound vac site during ambulation.  Premedicated and repositioned in supine to comfort.    Mobility  Bed Mobility Bed Mobility: Supine to Sit Supine to Sit: 5: Supervision Sit to Supine: 5: Supervision Details for Bed Mobility Assistance: pt aware of lines/drains Transfers Transfers: Stand to Sit;Sit to Stand Sit to Stand: 5: Supervision;With upper extremity assist;From bed Stand to Sit: 5: Supervision;With upper extremity assist;To bed Details for Transfer Assistance: verbal cue for hand placement with standing Ambulation/Gait Ambulation/Gait Assistance: 5: Supervision Ambulation Distance (Feet): 400 Feet Assistive device: Rolling walker Ambulation/Gait Assistance Details: appears better with ambulation today and premedicated so not in as much pain Gait Pattern: Step-through pattern;Decreased stride length    Exercises General Exercises - Lower Extremity Ankle Circles/Pumps: AROM;Both;20  reps;Supine Heel Slides: AROM;Strengthening;Both;10 reps;Supine Hip ABduction/ADduction: AROM;Strengthening;Both;10 reps;Supine Straight Leg Raises: AROM;Strengthening;Both;10 reps;Supine   PT Diagnosis:    PT Problem List:   PT Treatment Interventions:     PT Goals Acute Rehab PT Goals PT Goal: Supine/Side to Sit - Progress: Progressing toward goal PT Goal: Sit to Stand - Progress: Progressing toward goal PT Goal: Stand to Sit - Progress: Progressing toward goal PT Goal: Ambulate - Progress: Progressing toward goal PT Goal: Perform Home Exercise Program - Progress: Progressing toward goal  Visit Information  Last PT Received On: 03/12/12 Assistance Needed: +1    Subjective Data  Subjective: That wound vac just hurts when I sit.  (RN aware)   Cognition  Overall Cognitive Status: Appears within functional limits for tasks assessed/performed    Balance     End of Session PT - End of Session Activity Tolerance: Patient tolerated treatment well Patient left: in bed;with call bell/phone within reach;with family/visitor present   GP     [place]cca Barga,KATHrine E 03/12/2012, 4:09 PM Pager: [phone]

## 2012-03-12 NOTE — Progress Notes (Signed)
  Subjective: Feels good this morning; tolerating regular diet.  Objective: Vital signs in last 24 hours: Temp:  [98.2 F (36.8 C)-98.6 F (37 C)] 98.2 F (36.8 C) (11/25 0545) Pulse Rate:  [84-88] 84  (11/25 0545) Resp:  [18-20] 20  (11/25 0545) BP: (114-122)/(73-82) 118/75 mmHg (11/25 0545) SpO2:  [97 %-98 %] 98 % (11/25 0545) Last BM Date: 03/12/12  Intake/Output from previous day: 11/24 0701 - 11/25 0700 In: 4536 [P.O.:480; I.V.:1972; IV Piggyback:150; FAO:1308] Out: 2610 [Urine:2025; Drains:185; Stool:400] Intake/Output this shift:    General appearance: alert, cooperative, appears stated age and no distress Chest: CTA Cardiac: RRR Abdomen: wound vac in place functioning well (75 ml ss drainage) Abdominal drain #1 (80ml cloudy drainage) Abdominal drain #2 (30ml cloudy drainage) Ostomy ( liquid brown ) Soft, non tender except around abdominal drains, + BS Extremities: warm to touch, no edema or tenderness, + pulses. Labs: WBC wnl, VSS afebrile.  Lab Results:   [facility] 03/12/12 0435 03/10/12 0358  WBC 9.0 8.7  HGB 7.6* 7.7*  HCT 24.1* 24.2*  PLT 545* 523*   BMET  Basename 03/12/12 0435 03/10/12 0358  NA 137 139  K 3.4* 3.4*  CL 106 105  CO2 25 25  GLUCOSE 155* 115*  BUN 7 4*  CREATININE 0.74 0.66  CALCIUM 8.4 8.4   PT/INR No results found for this basename: LABPROT:2,INR:2 in the last 72 hours ABG No results found for this basename: PHART:2,PCO2:2,PO2:2,HCO3:2 in the last 72 hours  Studies/Results: No results found.  Anti-infectives: Anti-infectives     Start     Dose/Rate Route Frequency Ordered Stop   03/06/12 1500   piperacillin-tazobactam (ZOSYN) IVPB 3.375 g     Comments: Pharmacy to check dosing      3.375 g 12.5 mL/hr over 240 Minutes Intravenous 3 times per day 03/06/12 1145            Assessment/Plan:  Patient Active Problem List  Diagnosis  . UTI (lower urinary tract infection)  . Constipation  . Diverticulitis of  colon without hemorrhage  . Abdominal pain  . Colon obstruction  . Obesity, Class III, BMI 40-49.9 (morbid obesity)  . Acute respiratory failure with hypoxia  . Acute pulmonary edema  . Encephalopathy acute  . Tobacco abuse  . Intra-abdominal abscess, diverticular s/p surgical drainage  . Hypertriglyceridemia  Abdominal abscess  Plan: 1. Continue with regular diet 2. Wean TNA per pharmacy 3. Continue IVF, ABX 4. OOB/Ambulate 5. Continue with wound care 6. Will re CT abdomen pelvis today 7. Continue to follow clinical presentation.    LOS 6 days      Golda Acre [facility] Pager # [phone]  03/12/2012

## 2012-03-12 NOTE — Progress Notes (Signed)
Patient seen and examined.  Wound looks good.  Will repeat CT today.  Eventually will need studies through his drains before removal to r/o intestinal fistula.

## 2012-03-13 ENCOUNTER — Ambulatory Visit (HOSPITAL_COMMUNITY): Payer: Medicaid Other

## 2012-03-13 ENCOUNTER — Inpatient Hospital Stay (HOSPITAL_COMMUNITY): Payer: Medicaid Other

## 2012-03-13 LAB — GLUCOSE, CAPILLARY

## 2012-03-13 LAB — BASIC METABOLIC PANEL
BUN: 6 mg/dL (ref 6–23)
GFR calc Af Amer: >90 mL/min (ref >90)
GFR calc non Af Amer: >90 mL/min (ref >90)
Potassium: 3.5 mEq/L (ref 3.5–5.1)

## 2012-03-13 MED ORDER — IOHEXOL 300 MG/ML  SOLN
150.0000 mL | Freq: Once | INTRAMUSCULAR | Status: AC | PRN
  Administered 2012-03-13: 150 mL

## 2012-03-13 MED ORDER — FERROUS SULFATE 325 (65 FE) MG PO TABS
325.0000 mg | ORAL_TABLET | Freq: Three times a day (TID) | ORAL | Status: DC
  Administered 2012-03-13 – 2012-03-16 (×10): 325 mg via ORAL
  Filled 2012-03-13 (×14): qty 1

## 2012-03-13 NOTE — Progress Notes (Signed)
Patient ID: [name], male   DOB: [birth year], 41 y.o.   MRN: [id number]    Subjective: Feels good this morning; tolerating regular diet.  Objective: Vital signs in last 24 hours: Temp:  [97.9 F (36.6 C)-98.6 F (37 C)] 98.6 F (37 C) (11/26 0600) Pulse Rate:  [85-86] 86  (11/26 0600) Resp:  [20] 20  (11/26 0600) BP: (110-115)/(69-76) 113/69 mmHg (11/26 0600) SpO2:  [95 %-98 %] 96 % (11/26 0600) Last BM Date: 03/12/12  Intake/Output from previous day: 11/25 0701 - 11/26 0700 In: 3395.7 [P.O.:720; I.V.:1874.7; IV Piggyback:50; TPN:651] Out: 1885 [Urine:1250; Drains:10; Stool:625] Intake/Output this shift: Total I/O In: 120 [P.O.:120] Out: -   General appearance: alert, cooperative, appears stated age and no distress Chest: CTA Cardiac: RRR Abdomen: wound vac in place functioning well (10 ml ss drainage) Abdominal drain #1 (no output recorded) Abdominal drain #2 (No output recorded) Ostomy ( liquid brown ) Soft, non tender except around abdominal drains, + BS Extremities: warm to touch, no edema or tenderness, + pulses. VSS afebrile.  Lab Results:   [facility] 03/12/12 0435  WBC 9.0  HGB 7.6*  HCT 24.1*  PLT 545*   BMET  Basename 03/13/12 0408 03/12/12 0435  NA 139 137  K 3.5 3.4*  CL 105 106  CO2 25 25  GLUCOSE 109* 155*  BUN 6 7  CREATININE 0.71 0.74  CALCIUM 8.5 8.4   PT/INR No results found for this basename: LABPROT:2,INR:2 in the last 72 hours ABG No results found for this basename: PHART:2,PCO2:2,PO2:2,HCO3:2 in the last 72 hours  Studies/Results: Ct Abdomen Pelvis Wo Contrast  03/12/2012  *RADIOLOGY REPORT*  Clinical Data: Perforated diverticulitis status post partial colectomy and abscess drainage.  CT ABDOMEN AND PELVIS WITHOUT CONTRAST  Technique:  Multidetector CT imaging of the abdomen and pelvis was performed following the standard protocol without intravenous contrast.  Comparison: Abdominal pelvic CT 03/06/2012.  Findings: Left  pleural effusion has slightly enlarged.  There is slightly worsened left lower lobe atelectasis.  Mild right lower lobe atelectasis is unchanged.  The patient now has two drainage catheters within the left abdominal fluid collection. Both catheters are in the same general area.  There is minimal residual fluid and air surrounding the catheters, measuring 2.7 cm maximally on image 37.  No enlarging fluid collections are seen.  A small amount of fluid tracks inferiorly within the left pericolic gutter, improved from the prior study. There is also a small amount of fluid anteriorly, directly beneath the anterior abdominal wall, best seen on the coronal image number 30.  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  There is no evidence of bowel obstruction.  The liver, spleen, gallbladder, pancreas, adrenal glands and kidneys appear unremarkable as imaged in the noncontrast state.  Mild bladder wall thickening is noted.  Multiple calcified disc protrusions/posterior osteophytes are noted throughout the thoracolumbar spine.  IMPRESSION:  1.  Near complete drainage of left abdominal abscess following recent manipulation of drainage catheters. 2.  A small amount of fluid within the left pericolic gutter and beneath the left anterior abdominal wall has also improved.  There are no enlarging fluid collections. 3.  Slight worsening of left pleural effusion and left basilar atelectasis.   Original Report Authenticated By: Carey Bullocks, M.D.     Anti-infectives: Anti-infectives     Start     Dose/Rate Route Frequency Ordered Stop   03/06/12 1500   piperacillin-tazobactam (ZOSYN) IVPB 3.375 g     Comments: Pharmacy to check dosing  3.375 g 12.5 mL/hr over 240 Minutes Intravenous 3 times per day 03/06/12 1145            Assessment/Plan:  Patient Active Problem List  Diagnosis  . UTI (lower urinary tract infection)  . Constipation  . Diverticulitis of colon without hemorrhage  . Abdominal pain  . Colon  obstruction  . Obesity, Class III, BMI 40-49.9 (morbid obesity)  . Acute respiratory failure with hypoxia  . Acute pulmonary edema  . Encephalopathy acute  . Tobacco abuse  . Intra-abdominal abscess, diverticular s/p surgical drainage  . Hypertriglyceridemia  Abdominal abscess  Plan: 1. Continue with regular diet 3. Continue IVF, ABX 4. OOB/Ambulate 5. Continue with wound care 6. Will re CT abdomen pelvis with drain study today 7. Continue to follow clinical presentation.     LOS 6 days      Golda Acre [facility] Pager # [phone]  03/13/2012

## 2012-03-13 NOTE — Progress Notes (Signed)
Patient seen and examined.  CT is marked improved with respect to the abscess.  Will check fistulagrams through the drains today.

## 2012-03-13 NOTE — Progress Notes (Signed)
Subjective: Pt seems ok. Tol reg diet. +BMs No c/o increased pain  Objective: Physical Exam: BP 113/69  Pulse 86  Temp 98.6 F (37 C) (Oral)  Resp 20  Ht 5\' 10"  (1.778 m)  Wt 245 lb (111.131 kg)  BMI 35.15 kg/m2  SpO2 96% (L)abd drain intact, site clean. Output only recorded at 10cc, but pt and family report bags were emptied several times yesterday but unsure of how much.   Labs: CBC  Basename 03/12/12 0435  WBC 9.0  HGB 7.6*  HCT 24.1*  PLT 545*   BMET  Basename 03/13/12 0408 03/12/12 0435  NA 139 137  K 3.5 3.4*  CL 105 106  CO2 25 25  GLUCOSE 109* 155*  BUN 6 7  CREATININE 0.71 0.74  CALCIUM 8.5 8.4   LFT  Basename 03/12/12 0435  PROT 5.7*  ALBUMIN 1.9*  AST 39*  ALT 41  ALKPHOS 89  BILITOT 0.2*  BILIDIR --  IBILI --  LIPASE --   PT/INR No results found for this basename: LABPROT:2,INR:2 in the last 72 hours   Studies/Results: Ct Abdomen Pelvis Wo Contrast  03/12/2012  *RADIOLOGY REPORT*  Clinical Data: Perforated diverticulitis status post partial colectomy and abscess drainage.  CT ABDOMEN AND PELVIS WITHOUT CONTRAST  Technique:  Multidetector CT imaging of the abdomen and pelvis was performed following the standard protocol without intravenous contrast.  Comparison: Abdominal pelvic CT 03/06/2012.  Findings: Left pleural effusion has slightly enlarged.  There is slightly worsened left lower lobe atelectasis.  Mild right lower lobe atelectasis is unchanged.  The patient now has two drainage catheters within the left abdominal fluid collection. Both catheters are in the same general area.  There is minimal residual fluid and air surrounding the catheters, measuring 2.7 cm maximally on image 37.  No enlarging fluid collections are seen.  A small amount of fluid tracks inferiorly within the left pericolic gutter, improved from the prior study. There is also a small amount of fluid anteriorly, directly beneath the anterior abdominal wall, best seen on the  coronal image number 30.  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  There is no evidence of bowel obstruction.  The liver, spleen, gallbladder, pancreas, adrenal glands and kidneys appear unremarkable as imaged in the noncontrast state.  Mild bladder wall thickening is noted.  Multiple calcified disc protrusions/posterior osteophytes are noted throughout the thoracolumbar spine.  IMPRESSION:  1.  Near complete drainage of left abdominal abscess following recent manipulation of drainage catheters. 2.  A small amount of fluid within the left pericolic gutter and beneath the left anterior abdominal wall has also improved.  There are no enlarging fluid collections. 3.  Slight worsening of left pleural effusion and left basilar atelectasis.   Original Report Authenticated By: Carey Bullocks, M.D.     Assessment/Plan: Left abd abscess drains placed 11/20 Output way down. CT shows near complete resolution of abscess Recommend drain injection at some point to confirm no fistula prior to drain removal. Await CCS input/recs.    LOS: 7 days    Brayton El PA-C 03/13/2012 8:09 AM

## 2012-03-14 NOTE — Progress Notes (Addendum)
Subjective: Pt feels ok. Tol reg diet. +BMs No c/o increased pain. Drain injection yesterday went well.  Objective: Physical Exam: BP 132/84  Pulse 92  Temp 98.1 F (36.7 C) (Oral)  Resp 18  Ht 5\' 10"  (1.778 m)  Wt 245 lb (111.131 kg)  BMI 35.15 kg/m2  SpO2 99% (L)abd drains intact, site clean. Output from drains higher today, probably residual from injections yesterday.  Labs: CBC  Basename 03/12/12 0435  WBC 9.0  HGB 7.6*  HCT 24.1*  PLT 545*   BMET  Basename 03/13/12 0408 03/12/12 0435  NA 139 137  K 3.5 3.4*  CL 105 106  CO2 25 25  GLUCOSE 109* 155*  BUN 6 7  CREATININE 0.71 0.74  CALCIUM 8.5 8.4   LFT  Basename 03/12/12 0435  PROT 5.7*  ALBUMIN 1.9*  AST 39*  ALT 41  ALKPHOS 89  BILITOT 0.2*  BILIDIR --  IBILI --  LIPASE --   PT/INR No results found for this basename: LABPROT:2,INR:2 in the last 72 hours   Studies/Results: Ct Abdomen Pelvis Wo Contrast  03/12/2012  *RADIOLOGY REPORT*  Clinical Data: Perforated diverticulitis status post partial colectomy and abscess drainage.  CT ABDOMEN AND PELVIS WITHOUT CONTRAST  Technique:  Multidetector CT imaging of the abdomen and pelvis was performed following the standard protocol without intravenous contrast.  Comparison: Abdominal pelvic CT 03/06/2012.  Findings: Left pleural effusion has slightly enlarged.  There is slightly worsened left lower lobe atelectasis.  Mild right lower lobe atelectasis is unchanged.  The patient now has two drainage catheters within the left abdominal fluid collection. Both catheters are in the same general area.  There is minimal residual fluid and air surrounding the catheters, measuring 2.7 cm maximally on image 37.  No enlarging fluid collections are seen.  A small amount of fluid tracks inferiorly within the left pericolic gutter, improved from the prior study. There is also a small amount of fluid anteriorly, directly beneath the anterior abdominal wall, best seen on the  coronal image number 30.  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  There is no evidence of bowel obstruction.  The liver, spleen, gallbladder, pancreas, adrenal glands and kidneys appear unremarkable as imaged in the noncontrast state.  Mild bladder wall thickening is noted.  Multiple calcified disc protrusions/posterior osteophytes are noted throughout the thoracolumbar spine.  IMPRESSION:  1.  Near complete drainage of left abdominal abscess following recent manipulation of drainage catheters. 2.  A small amount of fluid within the left pericolic gutter and beneath the left anterior abdominal wall has also improved.  There are no enlarging fluid collections. 3.  Slight worsening of left pleural effusion and left basilar atelectasis.   Original Report Authenticated By: Carey Bullocks, M.D.    Dg Sinus/fist Tube Chk-non Gi  03/13/2012  *RADIOLOGY REPORT*  Clinical Data:The patient underwent percutaneous drainage catheter placement for left upper quadrant diverticular abscess 01/17/2012, with subsequent upsizing of drains 03/07/2012.  Status post partial colectomy and colostomy placement.  ABSCESS INJECTION  Fluoroscopy Time: 1.4 minutes  Comparison: Prior CT and interventional procedure imaging  Findings: Under fluoroscopic visualization, both the upper and lower left upper quadrant entry site drainage catheters were injected with approximately 30 ml Omnipaque contrast each.  On initial injection of the upper catheter, there is opacification of the left pericolic gutter without evidence for peristalsis or bowel type configuration on delayed visualization.  Upon injection of the inferior catheter, there was back flow of contrast without further opacification of the  abscess cavity.  IMPRESSION: Apparent opacification of the remnant of left upper quadrant abscess cavity/pericolic gutter but no fluoroscopic evidence for communication with bowel.  Follow-up of drainage catheter output is advised and consultation with  interventional radiology prior to removal of the pharynx.  I reviewed this exam with Dr. Deanne Coffer, who discussed these findings and recommendations with the patient as well.   Original Report Authenticated By: Christiana Pellant, M.D.     Assessment/Plan: Left abd abscess drains placed 11/20 CT shows near complete resolution of abscess Drain injection confirm no fistula. Agree to follow drain output and remove once minimal.    LOS: 8 days    Brayton El PA-C 03/14/2012 9:45 AM  At CCS request, 'Inferior' drain removed today without complication. Monitor output and possibly remove remaining drain tomorrow.  Brayton El

## 2012-03-14 NOTE — Progress Notes (Signed)
Patient seen and examined.  Drain output is slowly once contrast drain out.  Will have inferior drain pulled today.

## 2012-03-14 NOTE — Progress Notes (Signed)
Physical Therapy Note  2 attempts for PT tx session on today. Pt declined x2 with most recent complaint being increased pain after having drain pulled. Will check back on another day. Thanks.  [name], PT [phone]

## 2012-03-14 NOTE — Progress Notes (Signed)
Nutrition Follow-up  Intervention: Pt eating excellent. Nutrition signing off.   Diet Order: Regular   - MD in room with pt. Nursing reports pt eating excellent, tolerating regular diet without nausea. TPN d/c on 11/25.   Meds: Scheduled Meds:   . ferrous sulfate  325 mg Oral TID WC  . pantoprazole  40 mg Oral Daily  . piperacillin-tazobactam (ZOSYN)  IV  3.375 g Intravenous Q8H   Continuous Infusions:   . dextrose 5 % and 0.9 % NaCl with KCl 20 mEq/L 80 mL/hr at 03/14/12 0654   PRN Meds:.HYDROmorphone (DILAUDID) injection, [COMPLETED] iohexol, ondansetron, oxyCODONE-acetaminophen, promethazine, sodium chloride   CMP     Component Value Date/Time   NA 139 03/13/2012 0408   K 3.5 03/13/2012 0408   CL 105 03/13/2012 0408   CO2 25 03/13/2012 0408   GLUCOSE 109* 03/13/2012 0408   BUN 6 03/13/2012 0408   CREATININE 0.71 03/13/2012 0408   CALCIUM 8.5 03/13/2012 0408   PROT 5.7* 03/12/2012 0435   ALBUMIN 1.9* 03/12/2012 0435   AST 39* 03/12/2012 0435   ALT 41 03/12/2012 0435   ALKPHOS 89 03/12/2012 0435   BILITOT 0.2* 03/12/2012 0435   GFRNONAA >90 03/13/2012 0408   GFRAA >90 03/13/2012 0408    CBG (last 3)   Basename 03/13/12 0739 03/12/12 1558 03/12/12 1420  GLUCAP 83 116* 124*     Intake/Output Summary (Last 24 hours) at 03/14/12 0955 Last data filed at 03/14/12 0900  Gross per 24 hour  Intake   2863 ml  Output   4335 ml  Net  -1472 ml   Colostomy - 1,377ml total output yesterday  Weight Status: No new weights  Estimated needs:   2000-2300 calories 115-135g protein  Nutrition Dx:  Inadequate oral intake - resolved  Goal:  TPN to meet >90% of estimated nutritional needs - not met, TPN d/c.   No new goals.   Monitor:  Intake   Levon Hedger MS, RD, LDN [phone] Pager [phone] After Hours Pager

## 2012-03-14 NOTE — Progress Notes (Signed)
Patient ID: [name], male   DOB: July 28, 1970, 41 y.o.   MRN: [id number]    Subjective: Feels ok, over all pain controlled, some pain at drain site.  Denies n/v.  Objective: Vital signs in last 24 hours: Temp:  [97.8 F (36.6 C)-98.4 F (36.9 C)] 98.1 F (36.7 C) (11/27 0600) Pulse Rate:  [87-92] 92  (11/27 0600) Resp:  [18-20] 18  (11/27 0600) BP: (105-132)/(68-84) 132/84 mmHg (11/27 0600) SpO2:  [97 %-99 %] 99 % (11/27 0600) Last BM Date: 03/13/12  Intake/Output from previous day: 11/26 0701 - 11/27 0700 In: 2983 [P.O.:843; I.V.:2002; IV Piggyback:100] Out: 3835 [Urine:2250; Drains:285; Stool:1300] Intake/Output this shift:    PE: Abd: soft, wound vac in place midline with healthy edges, ostomy with solid output, healthy appearing overall, 2 IR drains in place #1Superior Left abdomen: 130cc/24hrs #2Inferior Left Abdomen: 155cc/24hrs  Lab Results:   Basename 03/12/12 0435  WBC 9.0  HGB 7.6*  HCT 24.1*  PLT 545*   BMET  Basename 03/13/12 0408 03/12/12 0435  NA 139 137  K 3.5 3.4*  CL 105 106  CO2 25 25  GLUCOSE 109* 155*  BUN 6 7  CREATININE 0.71 0.74  CALCIUM 8.5 8.4   PT/INR No results found for this basename: LABPROT:2,INR:2 in the last 72 hours CMP     Component Value Date/Time   NA 139 03/13/2012 0408   K 3.5 03/13/2012 0408   CL 105 03/13/2012 0408   CO2 25 03/13/2012 0408   GLUCOSE 109* 03/13/2012 0408   BUN 6 03/13/2012 0408   CREATININE 0.71 03/13/2012 0408   CALCIUM 8.5 03/13/2012 0408   PROT 5.7* 03/12/2012 0435   ALBUMIN 1.9* 03/12/2012 0435   AST 39* 03/12/2012 0435   ALT 41 03/12/2012 0435   ALKPHOS 89 03/12/2012 0435   BILITOT 0.2* 03/12/2012 0435   GFRNONAA >90 03/13/2012 0408   GFRAA >90 03/13/2012 0408   Lipase     Component Value Date/Time   LIPASE 24 01/16/2012 1210       Studies/Results: Ct Abdomen Pelvis Wo Contrast  03/12/2012  *RADIOLOGY REPORT*  Clinical Data: Perforated diverticulitis status post partial  colectomy and abscess drainage.  CT ABDOMEN AND PELVIS WITHOUT CONTRAST  Technique:  Multidetector CT imaging of the abdomen and pelvis was performed following the standard protocol without intravenous contrast.  Comparison: Abdominal pelvic CT 03/06/2012.  Findings: Left pleural effusion has slightly enlarged.  There is slightly worsened left lower lobe atelectasis.  Mild right lower lobe atelectasis is unchanged.  The patient now has two drainage catheters within the left abdominal fluid collection. Both catheters are in the same general area.  There is minimal residual fluid and air surrounding the catheters, measuring 2.7 cm maximally on image 37.  No enlarging fluid collections are seen.  A small amount of fluid tracks inferiorly within the left pericolic gutter, improved from the prior study. There is also a small amount of fluid anteriorly, directly beneath the anterior abdominal wall, best seen on the coronal image number 30.  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  There is no evidence of bowel obstruction.  The liver, spleen, gallbladder, pancreas, adrenal glands and kidneys appear unremarkable as imaged in the noncontrast state.  Mild bladder wall thickening is noted.  Multiple calcified disc protrusions/posterior osteophytes are noted throughout the thoracolumbar spine.  IMPRESSION:  1.  Near complete drainage of left abdominal abscess following recent manipulation of drainage catheters. 2.  A small amount of fluid within the left  pericolic gutter and beneath the left anterior abdominal wall has also improved.  There are no enlarging fluid collections. 3.  Slight worsening of left pleural effusion and left basilar atelectasis.   Original Report Authenticated By: Carey Bullocks, M.D.    Dg Sinus/fist Tube Chk-non Gi  03/13/2012  *RADIOLOGY REPORT*  Clinical Data:The patient underwent percutaneous drainage catheter placement for left upper quadrant diverticular abscess 01/17/2012, with subsequent  upsizing of drains 03/07/2012.  Status post partial colectomy and colostomy placement.  ABSCESS INJECTION  Fluoroscopy Time: 1.4 minutes  Comparison: Prior CT and interventional procedure imaging  Findings: Under fluoroscopic visualization, both the upper and lower left upper quadrant entry site drainage catheters were injected with approximately 30 ml Omnipaque contrast each.  On initial injection of the upper catheter, there is opacification of the left pericolic gutter without evidence for peristalsis or bowel type configuration on delayed visualization.  Upon injection of the inferior catheter, there was back flow of contrast without further opacification of the abscess cavity.  IMPRESSION: Apparent opacification of the remnant of left upper quadrant abscess cavity/pericolic gutter but no fluoroscopic evidence for communication with bowel.  Follow-up of drainage catheter output is advised and consultation with interventional radiology prior to removal of the pharynx.  I reviewed this exam with Dr. Deanne Coffer, who discussed these findings and recommendations with the patient as well.   Original Report Authenticated By: Christiana Pellant, M.D.     Anti-infectives: Anti-infectives     Start     Dose/Rate Route Frequency Ordered Stop   03/06/12 1500  piperacillin-tazobactam (ZOSYN) IVPB 3.375 g    Comments: Pharmacy to check dosing     3.375 g 12.5 mL/hr over 240 Minutes Intravenous 3 times per day 03/06/12 1145             Assessment/Plan 1. Intraabdominal abscesses:  2 IR drains in place both with output over 100/24hrs.  Drain study yesterday showed no suggestion of fistula.  Will keep both drains until drain output <10-15/24hr period.  Continue zosyn.  Will start discharge planning for patient.  2. Open abdominal wound: continues to improve, continue wound vac change MWF, will go home with advanced homehealth wound vac.  Care management aware  3. FTT/malnutrition/FE def anemia: on regular diet and  tolerated this well, has iron def anemia and is on ferrous sulfate, monitor for now.  LOS: 8 days    [name] 03/14/2012

## 2012-03-15 MED ORDER — ONDANSETRON HCL 4 MG PO TABS
4.0000 mg | ORAL_TABLET | Freq: Four times a day (QID) | ORAL | Status: DC | PRN
  Administered 2012-03-15 (×2): 4 mg via ORAL
  Filled 2012-03-15: qty 1

## 2012-03-15 MED ORDER — AMOXICILLIN-POT CLAVULANATE 875-125 MG PO TABS
1.0000 | ORAL_TABLET | Freq: Two times a day (BID) | ORAL | Status: DC
  Administered 2012-03-15 – 2012-03-16 (×3): 1 via ORAL
  Filled 2012-03-15 (×4): qty 1

## 2012-03-15 NOTE — Progress Notes (Signed)
  Subjective: Patient did not sleep well last night.  Sore on left side near drain Minimal yellowish drain output from remaining drain.  Objective: Vital signs in last 24 hours: Temp:  [98.4 F (36.9 C)-99 F (37.2 C)] 98.4 F (36.9 C) (11/28 0612) Pulse Rate:  [87-88] 87  (11/28 0612) Resp:  [16-18] 18  (11/28 0612) BP: (120-142)/(80-94) 142/94 mmHg (11/28 0612) SpO2:  [97 %-98 %] 98 % (11/28 0612) Last BM Date: 03/14/12  Intake/Output from previous day: 11/27 0701 - 11/28 0700 In: 2893 [P.O.:840; I.V.:1888; IV Piggyback:150] Out: 2191 [Urine:2150; Drains:40; Stool:1] Intake/Output this shift:    GI: soft, tender around left flank drain; ostomy with good output VAC with good seal  Lab Results:  No results found for this basename: WBC:2,HGB:2,HCT:2,PLT:2 in the last 72 hours BMET  [facility] 03/13/12 0408  NA 139  K 3.5  CL 105  CO2 25  GLUCOSE 109*  BUN 6  CREATININE 0.71  CALCIUM 8.5   PT/INR No results found for this basename: LABPROT:2,INR:2 in the last 72 hours ABG No results found for this basename: PHART:2,PCO2:2,PO2:2,HCO3:2 in the last 72 hours  Studies/Results: Dg Sinus/fist Tube Chk-non Gi  03/13/2012  *RADIOLOGY REPORT*  Clinical Data:The patient underwent percutaneous drainage catheter placement for left upper quadrant diverticular abscess 01/17/2012, with subsequent upsizing of drains 03/07/2012.  Status post partial colectomy and colostomy placement.  ABSCESS INJECTION  Fluoroscopy Time: 1.4 minutes  Comparison: Prior CT and interventional procedure imaging  Findings: Under fluoroscopic visualization, both the upper and lower left upper quadrant entry site drainage catheters were injected with approximately 30 ml Omnipaque contrast each.  On initial injection of the upper catheter, there is opacification of the left pericolic gutter without evidence for peristalsis or bowel type configuration on delayed visualization.  Upon injection of the inferior  catheter, there was back flow of contrast without further opacification of the abscess cavity.  IMPRESSION: Apparent opacification of the remnant of left upper quadrant abscess cavity/pericolic gutter but no fluoroscopic evidence for communication with bowel.  Follow-up of drainage catheter output is advised and consultation with interventional radiology prior to removal of the pharynx.  I reviewed this exam with Dr. Deanne Coffer, who discussed these findings and recommendations with the patient as well.   Original Report Authenticated By: Christiana Pellant, M.D.     Anti-infectives: Anti-infectives     Start     Dose/Rate Route Frequency Ordered Stop   03/15/12 1000   amoxicillin-clavulanate (AUGMENTIN) 875-125 MG per tablet 1 tablet        1 tablet Oral Every 12 hours 03/15/12 0831     03/06/12 1500   piperacillin-tazobactam (ZOSYN) IVPB 3.375 g  Status:  Discontinued     Comments: Pharmacy to check dosing      3.375 g 12.5 mL/hr over 240 Minutes Intravenous 3 times per day 03/06/12 1145 03/15/12 0831          Assessment/Plan: s/p * No surgery found * Continue drain for now Change to PO antibiotics Saline lock IV Home health has been arranged for MWF VAC changes Probable discharge tomorrow - possible drain removal prior to discharge.   LOS: 9 days    Joelly Bolanos K. 03/15/2012

## 2012-03-15 NOTE — Progress Notes (Signed)
Subjective: Pt feels ok. Tol reg diet. +BMs No c/o increased pain. Inferior drain removed yesterday.  Objective: Physical Exam: BP 142/94  Pulse 87  Temp 98.4 F (36.9 C) (Oral)  Resp 18  Ht 5\' 10"  (1.778 m)  Wt 245 lb (111.131 kg)  BMI 35.15 kg/m2  SpO2 98% (L)abd drain intact, site clean. Still some serous output with stranding, but was flushed earlier Cx of fluid from 11/22 has rare GNR, but growth on culture.   Labs: CBC No results found for this basename: WBC:2,HGB:2,HCT:2,PLT:2 in the last 72 hours BMET  [facility] 03/13/12 0408  NA 139  K 3.5  CL 105  CO2 25  GLUCOSE 109*  BUN 6  CREATININE 0.71  CALCIUM 8.5   LFT No results found for this basename: PROT,ALBUMIN,AST,ALT,ALKPHOS,BILITOT,BILIDIR,IBILI,LIPASE in the last 72 hours PT/INR No results found for this basename: LABPROT:2,INR:2 in the last 72 hours   Studies/Results: Dg Sinus/fist Tube Chk-non Gi  03/13/2012  *RADIOLOGY REPORT*  Clinical Data:The patient underwent percutaneous drainage catheter placement for left upper quadrant diverticular abscess 01/17/2012, with subsequent upsizing of drains 03/07/2012.  Status post partial colectomy and colostomy placement.  ABSCESS INJECTION  Fluoroscopy Time: 1.4 minutes  Comparison: Prior CT and interventional procedure imaging  Findings: Under fluoroscopic visualization, both the upper and lower left upper quadrant entry site drainage catheters were injected with approximately 30 ml Omnipaque contrast each.  On initial injection of the upper catheter, there is opacification of the left pericolic gutter without evidence for peristalsis or bowel type configuration on delayed visualization.  Upon injection of the inferior catheter, there was back flow of contrast without further opacification of the abscess cavity.  IMPRESSION: Apparent opacification of the remnant of left upper quadrant abscess cavity/pericolic gutter but no fluoroscopic evidence for communication with  bowel.  Follow-up of drainage catheter output is advised and consultation with interventional radiology prior to removal of the pharynx.  I reviewed this exam with Dr. Deanne Coffer, who discussed these findings and recommendations with the patient as well.   Original Report Authenticated By: Christiana Pellant, M.D.     Assessment/Plan: Left abd abscess drains placed 11/20 CT shows near complete resolution of abscess Drain injection confirm no fistula. Inferior drain removed yesterday Will stop flushes to see if anything other than flushes drains. If no drainage, will remove remaining drain prior to DC tomorrow.  Brayton El PA-C 03/15/2012 10:37 AM

## 2012-03-16 MED ORDER — AMOXICILLIN-POT CLAVULANATE 875-125 MG PO TABS: 1.0000 | ORAL_TABLET | Freq: Two times a day (BID) | ORAL | Status: DC

## 2012-03-16 MED ORDER — FERROUS SULFATE 325 (65 FE) MG PO TABS: 325.0000 mg | ORAL_TABLET | Freq: Three times a day (TID) | ORAL | Status: DC

## 2012-03-16 MED ORDER — OXYCODONE HCL 5 MG PO TABS: 5.0000 mg | ORAL_TABLET | Freq: Four times a day (QID) | ORAL | Status: DC | PRN

## 2012-03-16 NOTE — Progress Notes (Addendum)
Physical Therapy Treatment Patient Details Name: BACH ROCCHI MRN: [id number] DOB: October 14, 1970 Today's Date: 03/16/2012 Time: 4098-1191 PT Time Calculation (min): 24 min  PT Assessment / Plan / Recommendation Comments on Treatment Session  Pt progressing well and was pre medicated.  States his drain site feels like it's pulling and he does not like to sit tin the recliner beacuse it "feels like my stomach is pulling apart".  Pt might D/C to home today.    Follow Up Recommendations  Home health PT     Does the patient have the potential to tolerate intense rehabilitation     Barriers to Discharge        Equipment Recommendations  Rolling walker with 5" wheels;Other (comment) (Bari RW)    Recommendations for Other Services    Frequency Min 3X/week   Plan Discharge plan remains appropriate    Precautions / Restrictions Precautions Precautions: None Precaution Comments: wound VAC, L abd drain Restrictions Weight Bearing Restrictions: No   Pertinent Vitals/Pain No c/o pain Pre medicated    Mobility  Bed Mobility Bed Mobility: Supine to Sit;Sit to Supine Supine to Sit: 6: Modified independent (Device/Increase time) Sitting - Scoot to Edge of Bed: 6: Modified independent (Device/Increase time) Details for Bed Mobility Assistance: pt aware of lines/drains  Transfers Transfers: Sit to Stand;Stand to Sit Sit to Stand: 6: Modified independent (Device/Increase time);From bed Stand to Sit: 6: Modified independent (Device/Increase time);To bed Details for Transfer Assistance: good use of hands and safety cognition Ambulation/Gait Ambulation/Gait Assistance: 5: Supervision Ambulation Distance (Feet): 500 Feet Assistive device: Rolling walker Ambulation/Gait Assistance Details: used RW for safety/steadyness Gait Pattern: Step-through pattern;Decreased stride length Gait velocity: decreased  Stairs: practiced up/down one step forward w/ RW at Supervision level and one VC on  safety of RW placement    PT Goals                                                             progressing    Visit Information  Last PT Received On: 03/16/12 Assistance Needed: +1    Subjective Data      Cognition       Balance     End of Session PT - End of Session Equipment Utilized During Treatment: Gait belt Activity Tolerance: Patient tolerated treatment well Patient left: in bed;with call bell/phone within reach;with family/visitor present   Felecia Shelling  PTA [facility]  Acute  Rehab Pager     [phone]

## 2012-03-16 NOTE — Progress Notes (Signed)
Patient ID: [name], male   DOB: [birth year], 41 y.o.   MRN: [id number]    Subjective: Overall doing well, tolerating diet, pain with drain removal earlier this week, ostomy working well, wants to go home.  Objective: Vital signs in last 24 hours: Temp:  [98.3 F (36.8 C)-98.4 F (36.9 C)] 98.3 F (36.8 C) (11/29 0545) Pulse Rate:  [93] 93  (11/29 0545) Resp:  [18] 18  (11/29 0545) BP: (123-136)/(69-87) 127/69 mmHg (11/29 0545) SpO2:  [97 %-98 %] 98 % (11/29 0545) Last BM Date: 03/15/12  Intake/Output from previous day: 11/28 0701 - 11/29 0700 In: 860 [P.O.:480; I.V.:280; IV Piggyback:100] Out: 2120 [Urine:1650; Drains:120; Stool:350] Intake/Output this shift:    GI: soft, tender around left flank drain; ostomy with good output VAC with good seal, minimal clear yellow liquid in drain  Lab Results:  No results found for this basename: WBC:2,HGB:2,HCT:2,PLT:2 in the last 72 hours BMET No results found for this basename: NA:2,K:2,CL:2,CO2:2,GLUCOSE:2,BUN:2,CREATININE:2,CALCIUM:2 in the last 72 hours PT/INR No results found for this basename: LABPROT:2,INR:2 in the last 72 hours ABG No results found for this basename: PHART:2,PCO2:2,PO2:2,HCO3:2 in the last 72 hours  Studies/Results: No results found.  Anti-infectives: Anti-infectives     Start     Dose/Rate Route Frequency Ordered Stop   03/15/12 1000   amoxicillin-clavulanate (AUGMENTIN) 875-125 MG per tablet 1 tablet        1 tablet Oral Every 12 hours 03/15/12 0831     03/06/12 1500   piperacillin-tazobactam (ZOSYN) IVPB 3.375 g  Status:  Discontinued     Comments: Pharmacy to check dosing      3.375 g 12.5 mL/hr over 240 Minutes Intravenous 3 times per day 03/06/12 1145 03/15/12 0831          Assessment/Plan: Will plan for discharge today Arrange with home health to ensure vac is placed later today, will go home with wet to dry from here Augmentin for another 7 days F/U with Dr. Luisa Hart in 10-14 days  for wound check IR plans to remove 2nd drain today   LOS: 10 days    [name], [name] 03/16/2012 He looks good.  Drain with minimal out.  Should be okay for discharge to home with or without drain.  He would like to keep it for now and will check CT next week and pull in clinic

## 2012-03-16 NOTE — Progress Notes (Signed)
Pt for d/c today to home  with [facility] for wound vac dressing changes M-W F and L side drain dressings. Dressing supplies from [facility] & portable [facility]HC wound vac connected to pt.Will d/c PICC prior to d/c per IV team. Wife at bedside to assist with d/c. D/C instructions & Rx as well as wound care instructions per [facility] RN given with verbalized understanding.

## 2012-03-16 NOTE — Discharge Instructions (Signed)
Home health will change wound vac on Monday, Wednesday and Friday

## 2012-03-16 NOTE — Consult Note (Signed)
WOC consult Note Reason for Consult:V.A.C. Dressing change in preparation for discharge Wound type: Pressure Ulcer POA: Yes/No Measurement: 17cm x 4cm x 2.5cm Wound ZOX:WRUEA, pink and granulating Drainage (amount, consistency, odor) scant serous in cannister Periwound:intact.  Dry powder shave performed today Dressing procedure/placement/frequency: Continue npwt three times per week.  Next scheduled change is on Sunday per HHA regulations.  Next week will be Sunday, Wednesday and Friday, thereafter M-W-F. Ostomy pouch not changed because it is intact. Supplies for three pouch changes provided. I will remain available to this patient and his medical team.  Please re-consult if needed. Thanks, Ladona Mow, MSN, RN, [facility], CWOCN [phone])

## 2012-03-16 NOTE — Progress Notes (Signed)
Subjective: Pt feels ok. Tol reg diet. +BMs No c/o increased pain. Inferior drain removed.  Objective: Physical Exam: BP 127/69  Pulse 93  Temp 98.3 F (36.8 C) (Oral)  Resp 18  Ht 5\' 10"  (1.778 m)  Wt 245 lb (111.131 kg)  BMI 35.15 kg/m2  SpO2 98% (L)abd drain intact, site clean. Still some cloudy serous output, maybe 10-15cc Cx of fluid from 11/22 has rare GNR, but growth on culture.  Studies/Results: No results found.  Assessment/Plan: Left abd abscess drains placed 11/20 CT shows near complete resolution of abscess Drain injection confirm no fistula. Inferior drain removed. Discussed with Dr. Biagio Quint and pt, ok to leave drain in and discharge home. Recommend f/u next week with [facility]. If output remains minimal, drain can be pulled. If increased fevers or increased output from drain, could do repeat CT to check for recurrence of abscess  Brayton El PA-C 03/16/2012 9:44 AM

## 2012-03-20 NOTE — Discharge Summary (Signed)
Physician Discharge Summary  Patient ID: NOSSON WENDER MRN: [id number] DOB/AGE: 12-10-1970 41 y.o.  Admit date: 03/06/2012 Discharge date: 03/17/2012  Admitting Diagnosis: Nausea, vomiting, FTT   Discharge Diagnosis Patient Active Problem List   Diagnosis Date Noted  . Hypertriglyceridemia 01/22/2012  . Tobacco abuse 01/21/2012  . Intra-abdominal abscess, diverticular s/p surgical drainage 01/21/2012  . Acute respiratory failure with hypoxia 01/18/2012  . Acute pulmonary edema 01/18/2012  . Encephalopathy acute 01/18/2012  . Colon obstruction 01/17/2012  . Obesity, Class III, BMI 40-49.9 (morbid obesity) 01/17/2012  . UTI (lower urinary tract infection) 12/24/2011  . Constipation 12/24/2011  . Diverticulitis of colon without hemorrhage 12/24/2011  . Abdominal pain 12/24/2011    Consultants Interventional Radiology  Procedures 03/07/12 IR CATHETER TUBE CHANGE   Hospital Course:  41 yr old male who had a prolonged hospital stay due to perforated colon with complicated course due to abscesses and ileus. He was discharged on 10/27. He presented to our office today with 2 days of nausea and vomiting and feeling quite poor and weak. He has had significant drainage from around the colostomy site. The Eakins pouch over his left flank percutaneous drain is still draining a fair amount of purulent material. Overall, he does was not progressing very well therefore he was sent to the hospital for admission due to FTT.  Over the course of his admission his nutrition level was improved with TPN and PO intact.  His drain was exchanged and once the drainage reduced both drains were removed.  Once this was done he was felt ready for discharge with wound vac and follow up next week for wound check.      Medication List     As of 03/20/2012  8:44 AM    STOP taking these medications         HYDROcodone-acetaminophen 5-325 MG per tablet   Commonly known as: NORCO/VICODIN     metroNIDAZOLE 250 MG tablet   Commonly known as: FLAGYL      TAKE these medications         amoxicillin-clavulanate 875-125 MG per tablet   Commonly known as: AUGMENTIN   Take 1 tablet by mouth every 12 (twelve) hours.      ciprofloxacin 500 MG tablet   Commonly known as: CIPRO   Take 1 tablet (500 mg total) by mouth 2 (two) times daily.      ferrous sulfate 325 (65 FE) MG tablet   Take 1 tablet (325 mg total) by mouth 3 (three) times daily with meals.      oxyCODONE 5 MG immediate release tablet   Commonly known as: Oxy IR/ROXICODONE   Take 1-3 tablets (5-15 mg total) by mouth every 6 (six) hours as needed for pain.             Follow-up Information    Follow up with CORNETT,THOMAS A., MD. (Our office will call you with your appointment day and time)    Contact information:   [address] [place] [address] [phone]          Signed: Denny Levy [facility] [phone]  03/20/2012, 8:44 AM

## 2012-03-20 NOTE — Discharge Summary (Signed)
[name]. Jeffrey Skains, MD, [facility] Surgery  03/20/2012 9:01 AM

## 2012-03-21 ENCOUNTER — Telehealth (INDEPENDENT_AMBULATORY_CARE_PROVIDER_SITE_OTHER): Payer: Self-pay | Admitting: General Surgery

## 2012-03-21 NOTE — Telephone Encounter (Signed)
Pt called for refill of Oxycodone; paged Dr. Luisa Hart for orders.  Dr. Luisa Hart ordered Vicodin 7.5/ 325 mg,  # 30, 1-2 po Q 4-6 H prn pain, no refill.  Pt aware of change in medication.  Called to pharmacist at [facility]:  [phone].

## 2012-03-27 ENCOUNTER — Telehealth (INDEPENDENT_AMBULATORY_CARE_PROVIDER_SITE_OTHER): Payer: Self-pay | Admitting: General Surgery

## 2012-03-27 ENCOUNTER — Other Ambulatory Visit (INDEPENDENT_AMBULATORY_CARE_PROVIDER_SITE_OTHER): Payer: Self-pay | Admitting: Surgery

## 2012-03-27 NOTE — Telephone Encounter (Signed)
Pt's wife called to request a pain medication refill for [name]/ Hydrocodone 7.5/325 #30/ I reviewed this with Dr. Luisa Hart and he said ok for refill. I called [facility] [place]/ [phone] and refilled as above/pt's wife notified of refill/gy

## 2012-04-02 ENCOUNTER — Encounter (INDEPENDENT_AMBULATORY_CARE_PROVIDER_SITE_OTHER): Payer: Self-pay | Admitting: Surgery

## 2012-04-02 ENCOUNTER — Ambulatory Visit (INDEPENDENT_AMBULATORY_CARE_PROVIDER_SITE_OTHER): Payer: Self-pay | Admitting: Surgery

## 2012-04-02 VITALS — BP 126/84 | HR 84 | Temp 97.6°F | Resp 14 | Ht 70.0 in | Wt 255.2 lb

## 2012-04-02 DIAGNOSIS — IMO0002 Reserved for concepts with insufficient information to code with codable children: Secondary | ICD-10-CM

## 2012-04-02 DIAGNOSIS — K651 Peritoneal abscess: Secondary | ICD-10-CM

## 2012-04-02 MED ORDER — HYDROCODONE-ACETAMINOPHEN 7.5-325 MG PO TABS: 2.0000 | ORAL_TABLET | ORAL | Status: DC | PRN

## 2012-04-02 NOTE — Patient Instructions (Signed)
Return 3 weeks.   Repeat CT abdomen pelvis.

## 2012-04-02 NOTE — Progress Notes (Signed)
Patient returns in followup for his abdominal abscess and open abdominal wound. He was readmitted to [facility] 3 weeks ago for failure to thrive. He intra-abdominal abscess was not completely drained and the catheter is placed he felt much better. The wound VAC was reapplied his open abdominal wound. He is feeling better but had more drainage from his percutaneous site. His ostomy is functioning well. He still tired but doing better. He is draining 100 cc a day at his percutaneous drain and is foul-smelling.  Exam: Wound VAC in place. Per drain shows 100 cc of yellow green fluid foul-smelling. There is a small amount draining around the catheter.  Impression: Status post sigmoid colectomy complicated by entered bowel abscess secondary to colonic rupture 2 to colonic obstruction with subsequent intra-abdominal abscess and open wound  Plan: Repeat CT scan. Continue wound VAC. He had a recent fistulogram which shows no indication of the bowel but this is still a concern of mine at this point. Return 3 weeks. Hold antibiotics for now.

## 2012-04-05 ENCOUNTER — Other Ambulatory Visit (INDEPENDENT_AMBULATORY_CARE_PROVIDER_SITE_OTHER): Payer: Self-pay | Admitting: Surgery

## 2012-04-05 ENCOUNTER — Ambulatory Visit (HOSPITAL_COMMUNITY)
Admission: RE | Admit: 2012-04-05 | Discharge: 2012-04-05 | Disposition: A | Discharge status: Home / Self Care | Payer: Medicaid Other | Source: Ambulatory Visit | Attending: Surgery | Admitting: Surgery

## 2012-04-05 ENCOUNTER — Encounter (INDEPENDENT_AMBULATORY_CARE_PROVIDER_SITE_OTHER): Payer: Self-pay | Admitting: Surgery

## 2012-04-05 ENCOUNTER — Telehealth (INDEPENDENT_AMBULATORY_CARE_PROVIDER_SITE_OTHER): Payer: Self-pay | Admitting: General Surgery

## 2012-04-05 DIAGNOSIS — IMO0002 Reserved for concepts with insufficient information to code with codable children: Secondary | ICD-10-CM

## 2012-04-05 DIAGNOSIS — K651 Peritoneal abscess: Secondary | ICD-10-CM | POA: Insufficient documentation

## 2012-04-05 NOTE — Progress Notes (Signed)
Patient Information       Patient Name  Sex  DOB  SSN    [name], [name]  Male  [birth year]  [id number]             D/C Summaries signed by Doristine Mango, PA-C at 03/20/12 0848     Author:  Doristine Mango, PA-C  Service:  (none)  Author Type:  Physician Assistant   Filed:  03/20/12 0848  Note Time:  03/20/12 0844      Related Notes:  Cosigned by: Wilmon Arms. Corliss Skains, MD filed at 03/20/12 0901       Physician Discharge Summary    Patient ID:  [name]  MRN: [id number]  DOB/AGE: 1971-04-06 41 y.o.  Admit date: 03/06/2012  Discharge date: 03/17/2012  Admitting Diagnosis:  Nausea, vomiting, FTT  Discharge Diagnosis     Patient Active Problem List      Diagnosis  Date Noted     .  Hypertriglyceridemia  01/22/2012     .  Tobacco abuse  01/21/2012     .  Intra-abdominal abscess, diverticular s/p surgical drainage  01/21/2012     .  Acute respiratory failure with hypoxia  01/18/2012     .  Acute pulmonary edema  01/18/2012     .  Encephalopathy acute  01/18/2012     .  Colon obstruction  01/17/2012     .  Obesity, Class III, BMI 40-49.9 (morbid obesity)  01/17/2012     .  UTI (lower urinary tract infection)  12/24/2011     .  Constipation  12/24/2011     .  Diverticulitis of colon without hemorrhage  12/24/2011     .  Abdominal pain  12/24/2011     Consultants  Interventional Radiology  Procedures  03/07/12 IR CATHETER TUBE CHANGE  Hospital Course:  41 yr old male who had a prolonged hospital stay due to perforated colon with complicated course due to abscesses and ileus. He was discharged on 10/27. He presented to our office today with 2 days of nausea and vomiting and feeling quite poor and weak. He has had significant drainage from around the colostomy site. The Eakins pouch over his left flank percutaneous drain is still draining a fair amount of purulent material. Overall, he does was not progressing very well therefore he was sent to the hospital  for admission due to FTT. Over the course of his admission his nutrition level was improved with TPN and PO intact. His drain was exchanged and once the drainage reduced both drains were removed. Once this was done he was felt ready for discharge with wound vac and follow up next week for wound check.         Medication List          As of 03/20/2012 8:44 AM         STOP taking these medications            HYDROcodone-acetaminophen 5-325 MG per tablet       Commonly known as: NORCO/VICODIN       metroNIDAZOLE 250 MG tablet       Commonly known as: FLAGYL         TAKE these medications            amoxicillin-clavulanate 875-125 MG per tablet       Commonly known as: AUGMENTIN       Take 1 tablet by mouth every  12 (twelve) hours.       ciprofloxacin 500 MG tablet       Commonly known as: CIPRO       Take 1 tablet (500 mg total) by mouth 2 (two) times daily.       ferrous sulfate 325 (65 FE) MG tablet       Take 1 tablet (325 mg total) by mouth 3 (three) times daily with meals.       oxyCODONE 5 MG immediate release tablet       Commonly known as: Oxy IR/ROXICODONE       Take 1-3 tablets (5-15 mg total) by mouth every 6 (six) hours as needed for pain.                 Follow-up Information      Follow up with CORNETT,THOMAS A., MD. (Our office will call you with your appointment day and time)      Contact information:      [address]  [address]  [place] [address]  [phone]              Signed:  Denny Levy  [facility] Surgery  [phone]  03/20/2012, 8:44 AM        Addendum:  Attending:  Discharge diagnoses:  Intra-abdominal abscess Failure to thrive  Protein-calorie malnutrition Wound dehiscence  Wilmon Arms. Corliss Skains, MD, [facility]  04/05/2012 12:21 AM

## 2012-04-05 NOTE — Telephone Encounter (Signed)
[name] called to request pain medication refill of Hydrocodone 7.5mg - 1-2 q 4-6 hrs prn pain/ She also requested more tablets since they pay a co-pay each time./I reviewed this with Dr. Warren Lacy and he approved refill with #40/ I refilled at [facility] [place] [phone]/ [name] aware of call in refill/gy

## 2012-04-06 ENCOUNTER — Telehealth (INDEPENDENT_AMBULATORY_CARE_PROVIDER_SITE_OTHER): Payer: Self-pay

## 2012-04-06 ENCOUNTER — Other Ambulatory Visit (INDEPENDENT_AMBULATORY_CARE_PROVIDER_SITE_OTHER): Payer: Self-pay

## 2012-04-06 MED ORDER — AMOXICILLIN-POT CLAVULANATE 875-125 MG PO TABS: 1.0000 | ORAL_TABLET | Freq: Two times a day (BID) | ORAL | Status: DC

## 2012-04-06 NOTE — Telephone Encounter (Signed)
Called Morrie Sheldon to let her know Cornett said CT looks a lot better. Sent Augmentin refill to pharmacy electronically. Told her to watch fever and if he gets worse over weekend send him to ER. She understood. She will call back with anymore concerns.

## 2012-04-06 NOTE — Telephone Encounter (Signed)
The wife called to see if you had heard on the ct yet.  The pt has had a temp of 100.  His drain output is at least 100 cc per day and is green and has an odor.

## 2012-04-13 ENCOUNTER — Emergency Department (HOSPITAL_COMMUNITY)
Admission: EM | Admit: 2012-04-13 | Discharge: 2012-04-13 | Disposition: A | Discharge status: Home / Self Care | Payer: Medicaid Other | Attending: Emergency Medicine | Admitting: Emergency Medicine

## 2012-04-13 ENCOUNTER — Telehealth (INDEPENDENT_AMBULATORY_CARE_PROVIDER_SITE_OTHER): Payer: Self-pay

## 2012-04-13 ENCOUNTER — Emergency Department (HOSPITAL_COMMUNITY): Payer: Medicaid Other

## 2012-04-13 ENCOUNTER — Encounter (HOSPITAL_COMMUNITY): Payer: Self-pay | Admitting: Emergency Medicine

## 2012-04-13 DIAGNOSIS — Z9889 Other specified postprocedural states: Secondary | ICD-10-CM | POA: Insufficient documentation

## 2012-04-13 DIAGNOSIS — R109 Unspecified abdominal pain: Secondary | ICD-10-CM | POA: Insufficient documentation

## 2012-04-13 DIAGNOSIS — Z8719 Personal history of other diseases of the digestive system: Secondary | ICD-10-CM | POA: Insufficient documentation

## 2012-04-13 DIAGNOSIS — Z9089 Acquired absence of other organs: Secondary | ICD-10-CM | POA: Diagnosis not present

## 2012-04-13 DIAGNOSIS — Z87891 Personal history of nicotine dependence: Secondary | ICD-10-CM | POA: Diagnosis not present

## 2012-04-13 DIAGNOSIS — K5900 Constipation, unspecified: Secondary | ICD-10-CM | POA: Insufficient documentation

## 2012-04-13 LAB — CBC WITH DIFFERENTIAL/PLATELET
Basophils Absolute: 0 10*3/uL (ref 0.0–0.1)
Lymphocytes Relative: 24 % (ref 12–46)
Neutro Abs: 5.6 10*3/uL (ref 1.7–7.7)
Platelets: 510 10*3/uL — ABNORMAL HIGH (ref 150–400)
RDW: 14.6 % (ref 11.5–15.5)
WBC: 8.9 10*3/uL (ref 4.0–10.5)

## 2012-04-13 LAB — BASIC METABOLIC PANEL
CO2: 28 mEq/L (ref 19–32)
Chloride: 104 mEq/L (ref 96–112)
Sodium: 139 mEq/L (ref 135–145)

## 2012-04-13 LAB — BODY FLUID CULTURE: Special Requests: NORMAL

## 2012-04-13 MED ORDER — HYDROCODONE-ACETAMINOPHEN 7.5-325 MG PO TABS: 1.0000 | ORAL_TABLET | Freq: Four times a day (QID) | ORAL | Status: DC | PRN

## 2012-04-13 NOTE — Telephone Encounter (Signed)
Pt's wife called stating pt has abd distention,pain and has not had BM for 3 days. Reviewed with Dr Johna Sheriff. Pt advised to have 2 view abd xray and come to office for eval. Pt prefers to go to ER and have xray and our PA access him. Tresa Endo at [facility] notified of pt coming to ER and needing 2 view abd and have abd pain and constipation accessed. Wife advised to let ER know pt is [facility] pt and notify our PA.

## 2012-04-13 NOTE — ED Provider Notes (Signed)
History     CSN: [id number]  Arrival date & time 04/13/12  1228   First MD Initiated Contact with Patient 04/13/12 1332      Chief Complaint  Patient presents with  . Constipation  . Abdominal Pain    (Consider location/radiation/quality/duration/timing/severity/associated sxs/prior treatment) HPI Comments: This is a 41 year old male, who presents emergency department with chief complaint of constipation. Patient has history of diverticulitis that required colostomy placement a month ago. States over the past 3 days he has had decreased output into his colostomy bag. Patient states that he call his surgeon, and was told to come here, and that he would be seen by a surgeon here. Patient denies pain, nausea, vomiting. He has been taking hydrocodone 7.5, which has been controlling his pain.  The history is provided by the patient. No language interpreter was used.    Past Medical History  Diagnosis Date  . Urinary anastomotic stricture undiagnosed     hard to be cathed; urology did the last time  . Diverticulitis   . Colon obstruction 01/17/2012  . Obesity, Class III, BMI 40-49.9 (morbid obesity) 01/17/2012    Past Surgical History  Procedure Date  . Partial colectomy 01/17/2012    Procedure: PARTIAL COLECTOMY;  Surgeon: Clovis Pu. Cornett, MD;  Location: [facility];  Service: General;  Laterality: N/A;  . 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 Cornett, MD;  Location: [facility];  Service: General;  Laterality: N/A;    History reviewed. No pertinent family history.  History  Substance Use Topics  . Smoking status: Former Smoker -- 2.0 packs/day for 20 years    Types: Cigarettes    Start date: 01/16/2012  . Smokeless tobacco: Never Used  . Alcohol Use: No      Review of Systems  All other systems reviewed and are negative.    Allergies  Review of patient's allergies indicates no active allergies.  Home Medications   Current Outpatient Rx  Name   Route  Sig  Dispense  Refill  . AMOXICILLIN-POT CLAVULANATE 875-125 MG PO TABS   Oral   Take 1 tablet by mouth every 12 (twelve) hours.   14 tablet   0   . HYDROCODONE-ACETAMINOPHEN 7.5-325 MG PO TABS   Oral   Take 2 tablets by mouth every 4 (four) hours as needed for pain.   30 tablet   0     BP 133/91  Pulse 96  Temp 97.4 F (36.3 C) (Oral)  Resp 16  SpO2 97%  Physical Exam  Nursing note and vitals reviewed. Constitutional: He is oriented to person, place, and time. He appears well-developed and well-nourished.  HENT:  Head: Normocephalic and atraumatic.  Right Ear: External ear normal.  Left Ear: External ear normal.  Nose: Nose normal.  Mouth/Throat: Oropharynx is clear and moist. No oropharyngeal exudate.  Eyes: Conjunctivae normal and EOM are normal. Pupils are equal, round, and reactive to light. Right eye exhibits no discharge. Left eye exhibits no discharge. No scleral icterus.  Neck: Normal range of motion. Neck supple. No JVD present.  Cardiovascular: Normal rate, regular rhythm, normal heart sounds and intact distal pulses.  Exam reveals no gallop and no friction rub.   No murmur heard. Pulmonary/Chest: Effort normal and breath sounds normal. No respiratory distress. He has no wheezes. He has no rales. He exhibits no tenderness.  Abdominal: Soft. Bowel sounds are normal. He exhibits no distension and no mass. There is no tenderness. There is  no rebound and no guarding.       Colostomy left upper quadrant, as well as wound VAC for left flank abscess.  Musculoskeletal: Normal range of motion. He exhibits no edema and no tenderness.  Neurological: He is alert and oriented to person, place, and time. He has normal reflexes.       CN 3-12 intact  Skin: Skin is warm and dry.  Psychiatric: He has a normal mood and affect. His behavior is normal. Judgment and thought content normal.    ED Course  Procedures (including critical care time)  Labs Reviewed  CBC WITH  DIFFERENTIAL - Abnormal; Notable for the following:    Platelets 510 (*)     All other components within normal limits  BASIC METABOLIC PANEL - Abnormal; Notable for the following:    Glucose, Bld 110 (*)     All other components within normal limits   Dg Abd 2 Views  04/13/2012  *RADIOLOGY REPORT*  Clinical Data: Abdominal pain.  ABDOMEN - 2 VIEW  Comparison: CT of the abdomen and pelvis 04/05/2012.  Findings: There is a pigtail drainage catheter in place with loop reformed in the left upper quadrant of the abdomen (in a similar position to that noted on the recent CT examination).  There is a large amount of gas and stool scattered throughout the colon.  No definite pathologic distension of small bowel.  Some nondilated loops of gas-filled small bowel are noted, which are nonspecific. No gross evidence of pneumoperitoneum.  IMPRESSION: 1.  Nonspecific, nonobstructive bowel gas pattern. 2.  Left upper quadrant pigtail drainage catheter appears similarly positioned to a prior CT scan.   Original Report Authenticated By: Trudie Reed, M.D.      1. Constipation       MDM  41 year old male with constipation. He's been seen by surgery, who tells me to discharge the patient with regular followup, MiraLax, and pain medicine. Patient understands and agrees with the plan. He is stable and ready for discharge.        Roxy Horseman, PA-C 04/13/12 1512  Roxy Horseman, PA-C 04/13/12 1512

## 2012-04-13 NOTE — ED Provider Notes (Signed)
Medical screening examination/treatment/procedure(s) were performed by non-physician practitioner and as supervising physician I was immediately available for consultation/collaboration.   Lyanne Co, MD 04/13/12 [phone]

## 2012-04-13 NOTE — ED Notes (Signed)
MD at bedside. 

## 2012-04-13 NOTE — ED Notes (Signed)
Pt has hx of diverticulitis that required LUQ colostomy placement a month ago.  Pt states has small amount of fecal drainage in colostomy bag, but states this is all there has been in 3 days.  Pt states he has pain/pressure below colostomy site.  Pt states his PA Tresa Endo with [facility] will be here to see him.  No n/v.  Pt states he has had increasing gas in colostomy bag.  Pt also has midline wound vac and drain from L flank abscess.

## 2012-04-13 NOTE — Progress Notes (Signed)
Patient ID: [name], male   DOB: November 28, 1970, 41 y.o.   MRN: [id number]    Subjective: Pt is known to [place] as he is s/p Hartman's procedure for perforated diverticulitis by Dr. Luisa Hart on 01-17-12.  He has had a very complicated course with wound dehiscence, intra-abdominal abscess, and retraction of stoma.  He called our office today and stated he hadn't had any output from his ostomy in 3 days.  He decided to come to [facility] where I have seen him.  He states that he has no abdominal pain, nausea, or vomiting.  He has had minimal stool output, but still having quite a bit of flatus.  He has to burp his bag multiple times a day.  He has some liquids stool in his bag, but not much in the last 2 days.  He was nervous about this decrease in output and came to the ED for evaluation.   Objective: Vital signs in last 24 hours: Temp:  [97.4 F (36.3 C)] 97.4 F (36.3 C) (12/27 1305) Pulse Rate:  [96] 96  (12/27 1305) Resp:  [16] 16  (12/27 1305) BP: (133)/(91) 133/91 mmHg (12/27 1305) SpO2:  [97 %] 97 % (12/27 1305)    Intake/Output from previous day:   Intake/Output this shift:    PE: Abd: soft, NT, ND, +BS, wound VAC in place, drain in place with cloudy tan, foul smelling output.  Ostomy with air and some liquid stool Heart: regular Lungs: CTAB  Lab Results:  pending  Studies/Results: pending  Anti-infectives: Anti-infectives    None     Assessment/Plan  1. S/p Hartman's procedure for perforated sigmoid diverticulitis 2. Wound dehiscence 3. Intra-abdominal fluid collection 4. Decrease ostomy output  Plan: 1. Will get abdominal x-rays to evaluate the patient's abdomen.  I am doubtful that he has a bowel obstruction given that he is able to tolerate a diet and still having at least gaseous output and a small amount of feculent output.  He has no abdominal pain.  He is already on Augmentin that Dr. Luisa Hart put him on.  Will have more recommendations after his x-rays return.   LOS: 0 days    OSBORNE,KELLY E 04/13/2012, 2:28 PM Pager: [phone]  KUB showed some constipation. Agree with Kelly's assessment. He is going home and will check with our office for follow up with Dr. Luisa Hart.  Ovidio Kin, MD, [facility] Pager: [phone] Office phone:  [phone]

## 2012-04-16 ENCOUNTER — Other Ambulatory Visit (INDEPENDENT_AMBULATORY_CARE_PROVIDER_SITE_OTHER): Payer: Self-pay | Admitting: Surgery

## 2012-04-16 NOTE — Telephone Encounter (Signed)
Pt called for pain med refill.  Called Hydrocodone 5/325 mg, # 30, 1-2 po Q6H prn pain no refill to [facility]:  [phone].

## 2012-04-17 ENCOUNTER — Telehealth (INDEPENDENT_AMBULATORY_CARE_PROVIDER_SITE_OTHER): Payer: Self-pay | Admitting: General Surgery

## 2012-04-17 NOTE — Telephone Encounter (Signed)
Victorino Dike, nurse with [facility], called to ask for Nystatin for yeast infection around the stoma site.  After OK from Dr. Carolynne Edouard, called in Nystatin powder 100,000 units/ gram, # 60 gram bottle, AAA BID-TID prn, no refill to [facility]:  [phone].  Home Health nurse is aware and will call pt to pick up.

## 2012-04-23 ENCOUNTER — Encounter (INDEPENDENT_AMBULATORY_CARE_PROVIDER_SITE_OTHER): Payer: Self-pay | Admitting: Surgery

## 2012-04-23 ENCOUNTER — Ambulatory Visit (INDEPENDENT_AMBULATORY_CARE_PROVIDER_SITE_OTHER): Payer: Self-pay | Admitting: Surgery

## 2012-04-23 ENCOUNTER — Other Ambulatory Visit (INDEPENDENT_AMBULATORY_CARE_PROVIDER_SITE_OTHER): Payer: Self-pay

## 2012-04-23 ENCOUNTER — Telehealth (INDEPENDENT_AMBULATORY_CARE_PROVIDER_SITE_OTHER): Payer: Self-pay

## 2012-04-23 VITALS — BP 132/86 | HR 96 | Temp 97.6°F | Resp 18 | Ht 70.0 in | Wt 257.4 lb

## 2012-04-23 DIAGNOSIS — IMO0002 Reserved for concepts with insufficient information to code with codable children: Secondary | ICD-10-CM

## 2012-04-23 DIAGNOSIS — K651 Peritoneal abscess: Secondary | ICD-10-CM

## 2012-04-23 MED ORDER — HYDROCODONE-ACETAMINOPHEN 10-500 MG PO TABS: 1.0000 | ORAL_TABLET | Freq: Four times a day (QID) | ORAL | Status: DC | PRN

## 2012-04-23 NOTE — Patient Instructions (Signed)
SEND FOR DRAIN EVALUATION.  RETURN 3 WEEKS

## 2012-04-23 NOTE — Progress Notes (Signed)
Subjective:     Patient ID: [name], male   DOB: 1970-08-20, 42 y.o.   MRN: [id number]  HPI Patient returns in followup. He is having some problems keeping the drain stitch in place from his percutaneous drain. He was seen in the emergency room last week due to constipation this is resolved. Overall, things a little bit better than last saw him. His followup CT showed the catheters to be in good position in the fluid collections to be relatively stable. He continues to have cloudy her urine like material draining from his drains: 2 about 30-50 cc a day.  Review of Systems  Constitutional: Positive for activity change, appetite change and fatigue.       Objective:   Physical Exam  Constitutional: He appears well-developed and well-nourished.  Neck: Normal range of motion. Neck supple.  Abdominal:         Assessment:     Status post sigmoid colectomy and colostomy due to ruptured diverticular stricture 3 months out collocated by large intra-abdominal abscess with continued percutaneous drainage and open abdominal wound healing well    Plan:     We will recheck a fistulogram to strain to exclude an enterocutaneous fistula and possible drainage change if necessary. His wound is closed up quite nicely of the last 4 weeks and hopefully get him in better shape for colostomy closure. His colostomy is retracted and hopefully we can get him far enough along to the operating close this. Return in 3 weeks.

## 2012-04-23 NOTE — Telephone Encounter (Signed)
The nurse wants the Dr Luisa Hart to take the wound vac down and look at the wound.  She wants to see if he still needs the vac.  She has been using a non adherent dressing, gauze and foam before applying the vac.  She thinks he would be ok with bid wet to dry dressing changes.  She will need an order to use gauze or foam.  Please call her after the visit.

## 2012-04-24 ENCOUNTER — Other Ambulatory Visit (INDEPENDENT_AMBULATORY_CARE_PROVIDER_SITE_OTHER): Payer: Self-pay | Admitting: Surgery

## 2012-04-24 ENCOUNTER — Telehealth (INDEPENDENT_AMBULATORY_CARE_PROVIDER_SITE_OTHER): Payer: Self-pay | Admitting: General Surgery

## 2012-04-24 ENCOUNTER — Ambulatory Visit (HOSPITAL_COMMUNITY)
Admission: RE | Admit: 2012-04-24 | Discharge: 2012-04-24 | Disposition: A | Discharge status: Home / Self Care | Payer: Medicaid Other | Source: Ambulatory Visit | Attending: Surgery | Admitting: Surgery

## 2012-04-24 DIAGNOSIS — K651 Peritoneal abscess: Secondary | ICD-10-CM

## 2012-04-24 DIAGNOSIS — K632 Fistula of intestine: Secondary | ICD-10-CM | POA: Diagnosis not present

## 2012-04-24 DIAGNOSIS — Z9049 Acquired absence of other specified parts of digestive tract: Secondary | ICD-10-CM | POA: Insufficient documentation

## 2012-04-24 MED ORDER — IOHEXOL 300 MG/ML  SOLN: 15.0000 mL | Freq: Once | INTRAMUSCULAR | Status: AC | PRN

## 2012-04-24 MED ORDER — LIDOCAINE HCL 1 % IJ SOLN
INTRAMUSCULAR | Status: AC
  Filled 2012-04-24: qty 20

## 2012-04-24 NOTE — Procedures (Signed)
Contrast injection of the existing left upper quadrant abdominal drain demonstrates a fistulous connection with a loop of bowel within the left lower abdominal quadrant.  As such, the catheter was not removed.

## 2012-04-24 NOTE — Telephone Encounter (Signed)
Victorino Dike, nurse with [facility], called for verbal orders on the pt's wound vac (removed at office visit yesterday with Dr. Luisa Hart.)  Gave verbal order for vac to be discontinued and wet-to-dry dressings QD.

## 2012-04-26 ENCOUNTER — Telehealth (INDEPENDENT_AMBULATORY_CARE_PROVIDER_SITE_OTHER): Payer: Self-pay

## 2012-04-26 NOTE — Telephone Encounter (Signed)
LMOM to call back for test results. Please see Cornetts message below.

## 2012-04-26 NOTE — Telephone Encounter (Signed)
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    Not a surprise.  Treatment is the drainage catheter and this will be removed when we close his colostomy.

## 2012-04-27 ENCOUNTER — Telehealth (INDEPENDENT_AMBULATORY_CARE_PROVIDER_SITE_OTHER): Payer: Self-pay

## 2012-04-27 NOTE — Telephone Encounter (Signed)
[name] at [facility] called wanting to know if [facility] treatment  forms have been signed by Dr Johna Sheriff. I advised her I did not see them scanned in as signed but they may be with Dr Johna Sheriff in his to sign folder for today. She asked for Christy to call her if forms cannot be located. She can be reached at [phone] ext [phone].

## 2012-04-30 ENCOUNTER — Other Ambulatory Visit (INDEPENDENT_AMBULATORY_CARE_PROVIDER_SITE_OTHER): Payer: Self-pay | Admitting: Surgery

## 2012-04-30 NOTE — Telephone Encounter (Signed)
Pts wife called to say he will be out of pain medication tomorrow. Medication is Hydrocodone 10/325 according to Morrie Sheldon, her husband received it last week. Call back # [phone]/ gy

## 2012-05-07 ENCOUNTER — Telehealth (INDEPENDENT_AMBULATORY_CARE_PROVIDER_SITE_OTHER): Payer: Self-pay | Admitting: General Surgery

## 2012-05-07 NOTE — Telephone Encounter (Signed)
Merita Norton (spouse) called in requesting a refill of hydrocodone-acetaminophen 10-325, 30 pills. Stated he was out of the prescription issued last week (04/30/12). Advised that because of how far out he is post op, this request has to be approved. Advised request would be forwarded to the urgent office doctor today to be approved. Patient had partial colectomy 01/17/12. Confirmed call back # [phone].

## 2012-05-07 NOTE — Telephone Encounter (Signed)
Called back Morrie Sheldon to advise the prescription requested has been issued and available for pick up. Advised ID will be required to pick up. She stated he has enough for tonight and will come to the office in the morning to pick it up.

## 2012-05-12 ENCOUNTER — Telehealth (INDEPENDENT_AMBULATORY_CARE_PROVIDER_SITE_OTHER): Payer: Self-pay | Admitting: Surgery

## 2012-05-12 NOTE — Telephone Encounter (Signed)
Called by wife of the patient.  Patient having constipation again.  Passing gas out ostomy.  Had a couple episodes of heaving last night but not now.  No nausea or vomiting. Ate breakfast fine.  Walking well.  Tried one dose of MiraLAX and has not worked yet.  Had been told in the past may need to come to the emergency room but last time that happened got better on its own with just MiraLAX.  I recommend he try double or triple dose MiraLAX as long as he is passing gas and tolerating food.  If he gets worsening nausea or vomits or worsening abdominal pain, he may need x-rays of the evaluation emergency room to rule out a bowel blockage.  I strongly recommend he stay on MiraLAX at least once or not if not twice a day.  He still occasionally needs narcotics.  I noted that can constipate folks.  The wife seems reassured.  She is due to see Dr. Luisa Hart in a few days anyway.  She expressed understanding and appreciation.  Ardeth Sportsman, M.D., F.A.C.S. Gastrointestinal and Minimally Invasive Surgery [facility] [address], [address] [place], [place] [address] [phone] Main / Paging [phone] Voice Mail

## 2012-05-14 ENCOUNTER — Encounter (INDEPENDENT_AMBULATORY_CARE_PROVIDER_SITE_OTHER): Payer: Self-pay | Admitting: Surgery

## 2012-05-14 ENCOUNTER — Ambulatory Visit (INDEPENDENT_AMBULATORY_CARE_PROVIDER_SITE_OTHER): Payer: Self-pay | Admitting: Surgery

## 2012-05-14 VITALS — BP 120/76 | HR 108 | Resp 12 | Ht 70.0 in | Wt 261.0 lb

## 2012-05-14 DIAGNOSIS — K9403 Colostomy malfunction: Secondary | ICD-10-CM | POA: Insufficient documentation

## 2012-05-14 MED ORDER — PROMETHAZINE HCL 12.5 MG PO TABS: 12.5000 mg | ORAL_TABLET | Freq: Four times a day (QID) | ORAL | Status: DC | PRN

## 2012-05-14 NOTE — Progress Notes (Signed)
Patient ID: [name], male   DOB: 1971/03/16, 42 y.o.   MRN: [id number]  Chief Complaint  Patient presents with  . Re-evaluation    reck abd wound    HPI [name] is a 42 y.o. male.  Patient returns in followup risk colostomy and injury to his fistula after emergency room with colectomy in October 2013 for perforated diverticulitis secondary to obstruction of his sigmoid colon. 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 He does have some liquid stool but the colostomy itself shows significant anger skin obstruction. This is causing some nausea. He still having output out the ostomy that which is liquid. HPI  Past Medical History  Diagnosis Date  . Urinary anastomotic stricture undiagnosed     hard to be cathed; urology did the last time  . Diverticulitis   . Colon obstruction 01/17/2012  . Obesity, Class III, BMI 40-49.9 (morbid obesity) 01/17/2012    Past Surgical History  Procedure Date  . Partial colectomy 01/17/2012    Procedure: PARTIAL COLECTOMY;  Surgeon: Clovis Pu. Ronson Hagins, MD;  Location: [facility];  Service: General;  Laterality: N/A;  . 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 Analyn Matusek, MD;  Location: [facility];  Service: General;  Laterality: N/A;    History reviewed. No pertinent family history.  Social History History  Substance Use Topics  . Smoking status: Former Smoker -- 2.0 packs/day for 20 years    Types: Cigarettes    Start date: 01/16/2012  . Smokeless tobacco: Never Used  . Alcohol Use: No    No Active Allergies  Current Outpatient Prescriptions  Medication Sig Dispense Refill  . HYDROcodone-acetaminophen (NORCO) 10-325 MG per tablet TAKE ONE TABLET BY MOUTH EVERY 6 HOURS AS NEEDED FOR PAIN  30 tablet  0  . promethazine (PHENERGAN) 12.5 MG tablet Take 1 tablet (12.5 mg total) by mouth every 6 (six) hours as needed for nausea.   30 tablet  0    Review of Systems Review of Systems  HENT: Negative.   Gastrointestinal: Positive for nausea.    Blood pressure 120/76, pulse 108, resp. rate 12, height 5\' 10"  (1.778 m), weight 261 lb (118.389 kg).  Physical Exam Physical Exam  Constitutional: No distress.  HENT:  Head: Normocephalic and atraumatic.  Cardiovascular: Normal rate and regular rhythm.   Pulmonary/Chest: Effort normal and breath sounds normal.  Abdominal:    Skin: Skin is warm and dry.  Psychiatric: He has a normal mood and affect. His behavior is normal. Thought content normal.    Data Reviewed fistulogram  Enterocutaneous fistula noted     Assessment    Colostomy stenosis pt having more nausea and only liquid output.  Severely stenosed ostomy on exam    Plan    Take to OR for dilation of stenosed ostomy.  Need more time before taking down colostomy.  Fistula well drained with percutaneous drain.  Will do in next 24 - 48 hours.  Stay on clear liquids and continue miralax to keep stools liquid for now.       Kalea Perine A. 05/14/2012, 4:06 PM

## 2012-05-14 NOTE — Patient Instructions (Signed)
Stay on miralax daily and liquid diet.  On for wed for dilation of ostomy

## 2012-05-15 ENCOUNTER — Encounter (HOSPITAL_COMMUNITY): Payer: Self-pay | Admitting: *Deleted

## 2012-05-15 ENCOUNTER — Encounter (HOSPITAL_COMMUNITY): Payer: Self-pay | Admitting: Pharmacy Technician

## 2012-05-15 MED ORDER — SODIUM CHLORIDE 0.9 % IV SOLN
1.0000 g | INTRAVENOUS | Status: AC
  Administered 2012-05-16: 1 g via INTRAVENOUS
  Filled 2012-05-15: qty 1

## 2012-05-16 ENCOUNTER — Ambulatory Visit (HOSPITAL_COMMUNITY): Payer: Medicaid Other | Admitting: Certified Registered Nurse Anesthetist

## 2012-05-16 ENCOUNTER — Encounter (HOSPITAL_COMMUNITY): Payer: Self-pay | Admitting: Certified Registered Nurse Anesthetist

## 2012-05-16 ENCOUNTER — Ambulatory Visit (HOSPITAL_COMMUNITY)
Admission: RE | Admit: 2012-05-16 | Discharge: 2012-05-16 | Disposition: A | Discharge status: Home / Self Care | Payer: Medicaid Other | Source: Ambulatory Visit | Attending: Surgery | Admitting: Surgery

## 2012-05-16 ENCOUNTER — Encounter (HOSPITAL_COMMUNITY)
Admission: RE | Disposition: A | Discharge status: Home / Self Care | Payer: Self-pay | Source: Ambulatory Visit | Attending: Surgery

## 2012-05-16 ENCOUNTER — Encounter (HOSPITAL_COMMUNITY): Payer: Self-pay | Admitting: *Deleted

## 2012-05-16 DIAGNOSIS — N390 Urinary tract infection, site not specified: Secondary | ICD-10-CM

## 2012-05-16 DIAGNOSIS — Z6841 Body Mass Index (BMI) 40.0 and over, adult: Secondary | ICD-10-CM | POA: Diagnosis not present

## 2012-05-16 DIAGNOSIS — Z9049 Acquired absence of other specified parts of digestive tract: Secondary | ICD-10-CM | POA: Insufficient documentation

## 2012-05-16 DIAGNOSIS — Z720 Tobacco use: Secondary | ICD-10-CM

## 2012-05-16 DIAGNOSIS — Z79899 Other long term (current) drug therapy: Secondary | ICD-10-CM | POA: Diagnosis not present

## 2012-05-16 DIAGNOSIS — K5900 Constipation, unspecified: Secondary | ICD-10-CM

## 2012-05-16 DIAGNOSIS — G9340 Encephalopathy, unspecified: Secondary | ICD-10-CM

## 2012-05-16 DIAGNOSIS — R110 Nausea: Secondary | ICD-10-CM | POA: Insufficient documentation

## 2012-05-16 DIAGNOSIS — R109 Unspecified abdominal pain: Secondary | ICD-10-CM

## 2012-05-16 DIAGNOSIS — E669 Obesity, unspecified: Secondary | ICD-10-CM | POA: Diagnosis not present

## 2012-05-16 DIAGNOSIS — K651 Peritoneal abscess: Secondary | ICD-10-CM

## 2012-05-16 DIAGNOSIS — K5732 Diverticulitis of large intestine without perforation or abscess without bleeding: Secondary | ICD-10-CM

## 2012-05-16 DIAGNOSIS — IMO0002 Reserved for concepts with insufficient information to code with codable children: Secondary | ICD-10-CM | POA: Insufficient documentation

## 2012-05-16 DIAGNOSIS — K9403 Colostomy malfunction: Secondary | ICD-10-CM

## 2012-05-16 DIAGNOSIS — J810 Acute pulmonary edema: Secondary | ICD-10-CM

## 2012-05-16 DIAGNOSIS — K56609 Unspecified intestinal obstruction, unspecified as to partial versus complete obstruction: Secondary | ICD-10-CM

## 2012-05-16 DIAGNOSIS — J9601 Acute respiratory failure with hypoxia: Secondary | ICD-10-CM

## 2012-05-16 DIAGNOSIS — E781 Pure hyperglyceridemia: Secondary | ICD-10-CM

## 2012-05-16 HISTORY — PX: STOMA REVISION: SHX2448

## 2012-05-16 LAB — CBC WITH DIFFERENTIAL/PLATELET
Basophils Absolute: 0.1 10*3/uL (ref 0.0–0.1)
Basophils Relative: 0 % (ref 0–1)
Eosinophils Absolute: 0.2 10*3/uL (ref 0.0–0.7)
Eosinophils Relative: 1 % (ref 0–5)
MCH: 31.4 pg (ref 26.0–34.0)
MCHC: 34.2 g/dL (ref 30.0–36.0)
MCV: 91.8 fL (ref 78.0–100.0)
Platelets: 496 10*3/uL — ABNORMAL HIGH (ref 150–400)
RDW: 13.4 % (ref 11.5–15.5)

## 2012-05-16 LAB — COMPREHENSIVE METABOLIC PANEL
ALT: 51 U/L (ref 0–53)
AST: 43 U/L — ABNORMAL HIGH (ref 0–37)
Albumin: 4.3 g/dL (ref 3.5–5.2)
Calcium: 10.1 mg/dL (ref 8.4–10.5)
GFR calc Af Amer: >90 mL/min (ref >90)
Glucose, Bld: 114 mg/dL — ABNORMAL HIGH (ref 70–99)
Sodium: 135 mEq/L (ref 135–145)
Total Protein: 8.7 g/dL — ABNORMAL HIGH (ref 6.0–8.3)

## 2012-05-16 SURGERY — STOMA REVISION
Anesthesia: General | Site: Abdomen | Wound class: Contaminated

## 2012-05-16 MED ORDER — ACETAMINOPHEN 10 MG/ML IV SOLN
INTRAVENOUS | Status: DC | PRN
  Administered 2012-05-16: 1000 mg via INTRAVENOUS

## 2012-05-16 MED ORDER — BUPIVACAINE-EPINEPHRINE 0.25% -1:200000 IJ SOLN
INTRAMUSCULAR | Status: DC | PRN
  Administered 2012-05-16: 5 mL

## 2012-05-16 MED ORDER — LACTATED RINGERS IV SOLN: INTRAVENOUS | Status: DC

## 2012-05-16 MED ORDER — MUPIROCIN 2 % EX OINT
TOPICAL_OINTMENT | Freq: Two times a day (BID) | CUTANEOUS | Status: DC
  Administered 2012-05-16: 1 via NASAL
  Filled 2012-05-16: qty 22

## 2012-05-16 MED ORDER — BUPIVACAINE-EPINEPHRINE 0.25% -1:200000 IJ SOLN
INTRAMUSCULAR | Status: AC
  Filled 2012-05-16: qty 1

## 2012-05-16 MED ORDER — FENTANYL CITRATE 0.05 MG/ML IJ SOLN
INTRAMUSCULAR | Status: DC | PRN
  Administered 2012-05-16 (×3): 50 ug via INTRAVENOUS

## 2012-05-16 MED ORDER — MEPERIDINE HCL 50 MG/ML IJ SOLN: 6.2500 mg | INTRAMUSCULAR | Status: DC | PRN

## 2012-05-16 MED ORDER — 0.9 % SODIUM CHLORIDE (POUR BTL) OPTIME
TOPICAL | Status: DC | PRN
  Administered 2012-05-16: 1000 mL

## 2012-05-16 MED ORDER — LACTATED RINGERS IV SOLN
INTRAVENOUS | Status: DC | PRN
  Administered 2012-05-16 (×2): via INTRAVENOUS

## 2012-05-16 MED ORDER — LIDOCAINE HCL (CARDIAC) 20 MG/ML IV SOLN
INTRAVENOUS | Status: DC | PRN
  Administered 2012-05-16: 100 mg via INTRAVENOUS

## 2012-05-16 MED ORDER — ONDANSETRON HCL 4 MG/2ML IJ SOLN
INTRAMUSCULAR | Status: DC | PRN
  Administered 2012-05-16: 4 mg via INTRAVENOUS

## 2012-05-16 MED ORDER — PROPOFOL 10 MG/ML IV BOLUS
INTRAVENOUS | Status: DC | PRN
  Administered 2012-05-16: 250 mg via INTRAVENOUS

## 2012-05-16 MED ORDER — HYDROCODONE-ACETAMINOPHEN 10-500 MG PO TABS: 1.0000 | ORAL_TABLET | ORAL | Status: DC | PRN

## 2012-05-16 MED ORDER — ACETAMINOPHEN 10 MG/ML IV SOLN
INTRAVENOUS | Status: AC
  Filled 2012-05-16: qty 100

## 2012-05-16 MED ORDER — MIDAZOLAM HCL 5 MG/5ML IJ SOLN
INTRAMUSCULAR | Status: DC | PRN
  Administered 2012-05-16 (×2): 1 mg via INTRAVENOUS

## 2012-05-16 MED ORDER — CHLORHEXIDINE GLUCONATE 4 % EX LIQD
1.0000 "application " | Freq: Once | CUTANEOUS | Status: DC
  Filled 2012-05-16: qty 15

## 2012-05-16 MED ORDER — FENTANYL CITRATE 0.05 MG/ML IJ SOLN: 25.0000 ug | INTRAMUSCULAR | Status: DC | PRN

## 2012-05-16 MED ORDER — SODIUM CHLORIDE 0.9 % IV SOLN
INTRAVENOUS | Status: AC
  Filled 2012-05-16: qty 1

## 2012-05-16 MED ORDER — PROMETHAZINE HCL 25 MG/ML IJ SOLN: 6.2500 mg | INTRAMUSCULAR | Status: DC | PRN

## 2012-05-16 SURGICAL SUPPLY — 47 items
APPLICATOR COTTON TIP 6IN STRL (MISCELLANEOUS) IMPLANT
BLADE EXTENDED COATED 6.5IN (ELECTRODE) IMPLANT
BLADE HEX COATED 2.75 (ELECTRODE) ×3 IMPLANT
BLADE SURG SZ10 CARB STEEL (BLADE) IMPLANT
CANISTER SUCTION 2500CC (MISCELLANEOUS) ×3 IMPLANT
CLIP TI LARGE 6 (CLIP) IMPLANT
CLOTH BEACON ORANGE TIMEOUT ST (SAFETY) ×3 IMPLANT
COVER MAYO STAND STRL (DRAPES) IMPLANT
DECANTER SPIKE VIAL GLASS SM (MISCELLANEOUS) ×3 IMPLANT
DRAPE LAPAROSCOPIC ABDOMINAL (DRAPES) ×3 IMPLANT
DRAPE LG THREE QUARTER DISP (DRAPES) IMPLANT
DRAPE WARM FLUID 44X44 (DRAPE) IMPLANT
ELECT REM PT RETURN 9FT ADLT (ELECTROSURGICAL) ×3
ELECTRODE REM PT RTRN 9FT ADLT (ELECTROSURGICAL) ×2 IMPLANT
GLOVE BIOGEL PI IND STRL 7.0 (GLOVE) ×2 IMPLANT
GLOVE BIOGEL PI INDICATOR 7.0 (GLOVE) ×1
GLOVE INDICATOR 8.0 STRL GRN (GLOVE) ×3 IMPLANT
GLOVE SS BIOGEL STRL SZ 8 (GLOVE) ×4 IMPLANT
GLOVE SUPERSENSE BIOGEL SZ 8 (GLOVE) ×2
GLOVE SURG SS PI 6.5 STRL IVOR (GLOVE) ×6 IMPLANT
GOWN STRL NON-REIN LRG LVL3 (GOWN DISPOSABLE) ×6 IMPLANT
GOWN STRL REIN XL XLG (GOWN DISPOSABLE) ×3 IMPLANT
HAND ACTIVATED (MISCELLANEOUS) IMPLANT
KIT BASIN OR (CUSTOM PROCEDURE TRAY) ×3 IMPLANT
LEGGING LITHOTOMY PAIR STRL (DRAPES) IMPLANT
LIGASURE IMPACT 36 18CM CVD LR (INSTRUMENTS) IMPLANT
NEEDLE HYPO 22GX1.5 SAFETY (NEEDLE) ×3 IMPLANT
NS IRRIG 1000ML POUR BTL (IV SOLUTION) ×3 IMPLANT
PACK GENERAL/GYN (CUSTOM PROCEDURE TRAY) ×3 IMPLANT
POUCH DRAINABLE 1PC 2 1/4 FLAT (OSTOMY) ×3 IMPLANT
SPONGE GAUZE 4X4 12PLY (GAUZE/BANDAGES/DRESSINGS) ×3 IMPLANT
STAPLER VISISTAT 35W (STAPLE) IMPLANT
SUCTION POOLE TIP (SUCTIONS) IMPLANT
SUT PDS AB 1 CTX 36 (SUTURE) IMPLANT
SUT PROLENE 2 0 SH DA (SUTURE) ×6 IMPLANT
SUT SILK 2 0 (SUTURE)
SUT SILK 2 0 SH CR/8 (SUTURE) IMPLANT
SUT SILK 2 0SH CR/8 30 (SUTURE) IMPLANT
SUT SILK 2-0 18XBRD TIE 12 (SUTURE) IMPLANT
SUT SILK 2-0 30XBRD TIE 12 (SUTURE) IMPLANT
SUT SILK 3 0 (SUTURE)
SUT SILK 3 0 SH CR/8 (SUTURE) IMPLANT
SUT SILK 3-0 18XBRD TIE 12 (SUTURE) IMPLANT
SYR CONTROL 10ML LL (SYRINGE) ×3 IMPLANT
TOWEL OR 17X26 10 PK STRL BLUE (TOWEL DISPOSABLE) ×3 IMPLANT
TRAY FOLEY CATH 14FRSI W/METER (CATHETERS) IMPLANT
YANKAUER SUCT BULB TIP NO VENT (SUCTIONS) IMPLANT

## 2012-05-16 NOTE — Anesthesia Postprocedure Evaluation (Addendum)
  Anesthesia Post-op Note  Patient: [name]  Procedure(s) Performed: Procedure(s) (LRB): STOMA REVISION (N/A)  Patient Location: PACU  Anesthesia Type: General  Level of Consciousness: awake and alert   Airway and Oxygen Therapy: Patient Spontanous Breathing  Post-op Pain: mild  Post-op Assessment: Post-op Vital signs reviewed, Patient's Cardiovascular Status Stable, Respiratory Function Stable, Patent Airway and No signs of Nausea or vomiting  Last Vitals:  Filed Vitals:   05/16/12 1915  BP: 120/76  Pulse: 89  Temp: 37.3 C  Resp: 13    Post-op Vital Signs: stable   Complications: No apparent anesthesia complications

## 2012-05-16 NOTE — Anesthesia Preprocedure Evaluation (Signed)
Anesthesia Evaluation  Patient identified by MRN, date of birth, ID band Patient awake    Reviewed: Allergy & Precautions, H&P , NPO status , Patient's Chart, lab work & pertinent test results  Airway Mallampati: II TM Distance: >3 FB Neck ROM: full    Dental No notable dental hx. (+) Edentulous Upper and Edentulous Lower   Pulmonary neg pulmonary ROS,  breath sounds clear to auscultation  Pulmonary exam normal       Cardiovascular negative cardio ROS  Rhythm:regular Rate:Normal     Neuro/Psych negative neurological ROS  negative psych ROS   GI/Hepatic negative GI ROS, Neg liver ROS,   Endo/Other  negative endocrine ROS  Renal/GU negative Renal ROS  negative genitourinary   Musculoskeletal   Abdominal (+) + obese,   Peds  Hematology negative hematology ROS (+)   Anesthesia Other Findings   Reproductive/Obstetrics negative OB ROS                           Anesthesia Physical  Anesthesia Plan  ASA: III  Anesthesia Plan: General   Post-op Pain Management:    Induction: Intravenous  Airway Management Planned: LMA  Additional Equipment:   Intra-op Plan:   Post-operative Plan: Extubation in OR  Informed Consent: I have reviewed the patients History and Physical, chart, labs and discussed the procedure including the risks, benefits and alternatives for the proposed anesthesia with the patient or authorized representative who has indicated his/her understanding and acceptance.   Dental Advisory Given and Dental advisory given  Plan Discussed with: CRNA and Surgeon  Anesthesia Plan Comments:         Anesthesia Quick Evaluation

## 2012-05-16 NOTE — H&P (View-Only) (Signed)
Patient ID: [name], male   DOB: 07/07/1970, 41 y.o.   MRN: [id number]  Chief Complaint  Patient presents with  . Re-evaluation    reck abd wound    HPI [name] is a 41 y.o. male.  Patient returns in followup risk colostomy and injury to his fistula after emergency room with colectomy in October 2013 for perforated diverticulitis secondary to obstruction of his sigmoid colon. 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 He does have some liquid stool but the colostomy itself shows significant anger skin obstruction. This is causing some nausea. He still having output out the ostomy that which is liquid. HPI  Past Medical History  Diagnosis Date  . Urinary anastomotic stricture undiagnosed     hard to be cathed; urology did the last time  . Diverticulitis   . Colon obstruction 01/17/2012  . Obesity, Class III, BMI 40-49.9 (morbid obesity) 01/17/2012    Past Surgical History  Procedure Date  . Partial colectomy 01/17/2012    Procedure: PARTIAL COLECTOMY;  Surgeon: Brixton Franko A. Anjeanette Petzold, MD;  Location: [facility];  Service: General;  Laterality: N/A;  . Colostomy 01/17/2012    Procedure: COLOSTOMY;  Surgeon: Leovardo Thoman A. Jaquesha Boroff, MD;  Location: [facility];  Service: General;  Laterality: N/A;    History reviewed. No pertinent family history.  Social History History  Substance Use Topics  . Smoking status: Former Smoker -- 2.0 packs/day for 20 years    Types: Cigarettes    Start date: 01/16/2012  . Smokeless tobacco: Never Used  . Alcohol Use: No    No Active Allergies  Current Outpatient Prescriptions  Medication Sig Dispense Refill  . HYDROcodone-acetaminophen (NORCO) 10-325 MG per tablet TAKE ONE TABLET BY MOUTH EVERY 6 HOURS AS NEEDED FOR PAIN  30 tablet  0  . promethazine (PHENERGAN) 12.5 MG tablet Take 1 tablet (12.5 mg total) by mouth every 6 (six) hours as needed for nausea.   30 tablet  0    Review of Systems Review of Systems  HENT: Negative.   Gastrointestinal: Positive for nausea.    Blood pressure 120/76, pulse 108, resp. rate 12, height 5' 10" (1.778 m), weight 261 lb (118.389 kg).  Physical Exam Physical Exam  Constitutional: No distress.  HENT:  Head: Normocephalic and atraumatic.  Cardiovascular: Normal rate and regular rhythm.   Pulmonary/Chest: Effort normal and breath sounds normal.  Abdominal:    Skin: Skin is warm and dry.  Psychiatric: He has a normal mood and affect. His behavior is normal. Thought content normal.    Data Reviewed fistulogram  Enterocutaneous fistula noted     Assessment    Colostomy stenosis pt having more nausea and only liquid output.  Severely stenosed ostomy on exam    Plan    Take to OR for dilation of stenosed ostomy.  Need more time before taking down colostomy.  Fistula well drained with percutaneous drain.  Will do in next 24 - 48 hours.  Stay on clear liquids and continue miralax to keep stools liquid for now.       Kemiya Batdorf A. 05/14/2012, 4:06 PM    

## 2012-05-16 NOTE — Preoperative (Signed)
Beta Blockers   Reason not to administer Beta Blockers:Not Applicable 

## 2012-05-16 NOTE — Brief Op Note (Signed)
05/16/2012  7:19 PM  PATIENT:  [name]  43 y.o. male  PRE-OPERATIVE DIAGNOSIS:  ostomy stenosis  POST-OPERATIVE DIAGNOSIS:  ostomy stenosis  PROCEDURE:  Procedure(s) (LRB) with comments: STOMA REVISION (N/A) - Dilation of ostomy  SURGEON:  Surgeon(s) and Role:    * Ericia Moxley A. Sabrie Moritz, MD - Primary  PHYSICIAN ASSISTANT:   ASSISTANTS: none   ANESTHESIA:   local and general  EBL:  Total I/O In: 50 [I.V.:50] Out: 25 [Blood:25]  BLOOD ADMINISTERED:none  DRAINS: none   LOCAL MEDICATIONS USED:  BUPIVICAINE   SPECIMEN:  No Specimen  DISPOSITION OF SPECIMEN:  N/A  COUNTS:  YES  TOURNIQUET:  * No tourniquets in log *  DICTATION: .Other Dictation: Dictation Number [id number]  PLAN OF CARE: Discharge to home after PACU  PATIENT DISPOSITION:  PACU - hemodynamically stable.   Delay start of Pharmacological VTE agent (>24hrs) due to surgical blood loss or risk of bleeding: not applicable

## 2012-05-16 NOTE — Progress Notes (Signed)
Family informed that surgery is delayed. They verbalize understanding.

## 2012-05-16 NOTE — Transfer of Care (Signed)
Immediate Anesthesia Transfer of Care Note  Patient: [name]  Procedure(s) Performed: Procedure(s) (LRB) with comments: STOMA REVISION (N/A) - Dilation of ostomy  Patient Location: PACU  Anesthesia Type:General  Level of Consciousness: awake, alert  and oriented  Airway & Oxygen Therapy: Patient Spontanous Breathing and Patient connected to face mask oxygen  Post-op Assessment: Report given to PACU RN and Post -op Vital signs reviewed and stable  Post vital signs: Reviewed and stable  Complications: No apparent anesthesia complications

## 2012-05-16 NOTE — Interval H&P Note (Signed)
History and Physical Interval Note:  05/16/2012 6:02 PM  [name]  has presented today for surgery, with the diagnosis of ostomy stenosis  The various methods of treatment have been discussed with the patient and family. After consideration of risks, benefits and other options for treatment, the patient has consented to  Procedure(s) (LRB) with comments: ILEOSTOMY TAKEDOWN (N/A) - Dilation of ostomy as a surgical intervention .  The patient's history has been reviewed, patient examined, no change in status, stable for surgery.  I have reviewed the patient's chart and labs.  Questions were answered to the patient's satisfaction.     Codee Bloodworth A.

## 2012-05-17 ENCOUNTER — Encounter (HOSPITAL_COMMUNITY): Payer: Self-pay | Admitting: Surgery

## 2012-05-17 ENCOUNTER — Telehealth (INDEPENDENT_AMBULATORY_CARE_PROVIDER_SITE_OTHER): Payer: Self-pay

## 2012-05-17 ENCOUNTER — Telehealth (INDEPENDENT_AMBULATORY_CARE_PROVIDER_SITE_OTHER): Payer: Self-pay | Admitting: General Surgery

## 2012-05-17 NOTE — Telephone Encounter (Signed)
[name] called to report that [name] started complaining of a migraine headache yesterday with increased nausea. The migraine is on left side, he is currently taking medication for nausea after surgery. Pain medication is not helping. I paged Dr. Carolynne Edouard for advice/gy

## 2012-05-17 NOTE — Telephone Encounter (Signed)
[facility] nurse called to d/c pt.  He has an abdominal wound which has healed and there is a drain in place.  Pt does not have insurance, and his wife is able to care for the drain.

## 2012-05-17 NOTE — Telephone Encounter (Signed)
Morrie Sheldon called to report that mr. Pevehouse had a temperature this am, 102.8, no nausea or other symptoms reported, pt stated" he felt ok, ostomy and drain ok per Ashley/ I instructed her to treat fever with medication and call into office if fever has not improved or other symptoms develop/gy

## 2012-05-17 NOTE — Op Note (Signed)
NAME:  [name], [name] NO.:  [id number]  MEDICAL RECORD NO.:  [id number]  LOCATION:  [facility]                         FACILITY:  [facility]  PHYSICIAN:  Hebe Merriwether A. 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  PROCEDURE:  Dilatation and revision of left lower quadrant colostomy.  SURGEON:  Maisie Fus A. Thijs Brunton, M.D.  ANESTHESIA:  LMA with 0.25% Sensorcaine local.  EBL:  Minimal.  SPECIMENS:  None.  INDICATIONS FOR PROCEDURE:  The patient is a 42 year old male who in October 2013 underwent emergent exploratory laparotomy and sigmoid colectomy for stricture secondary to diverticulitis with perforation of his descending colon.  He had a difficult postoperative course complicated by renal failure, respiratory failure, requiring intubation, and intraperitoneal abscess and has developed a subsequent enterocutaneous fistula.  This had been drained but his ostomy has stenosed  down to completely almost complete closure and I recommended dilation.  Our goal was to be able to get him out another 1 or 2 months to improve his nutrition and then subsequently reoperate on him to close his colostomy.  He is having more nausea and obstructive-type symptoms in the office 2 days ago and I recommended coming in  general anesthesia to dilate this up and possibly stented.  Risk of bleeding, infection, and the need for other operations discussed.  He understood and agreed to proceed.  DESCRIPTION OF PROCEDURE:  The patient was in the holding area and questions were answered.  He was taken back to the operative placed supine where LMA anesthesia was initiated.  The abdomen and left upper quadrant drain site and colostomy site were prepped and draped in sterile fashion.   Time-out was done.  Using local anesthesia infiltrated around where his left lower quadrant drain and ostomy site were.  I placed additional 2-0 Prolene stitch around his left upper quadrant drain.  The os was then dilated with Hegar dilators.  I used a 14, 16, and 18 Hegar dilators, and then my finger to dilate the opening.  I was able to get 2 fingers into the ostomy which was fairly retracted but mucosa could be seen circumferentially.  I used a 30-French mushroom catheter and I cut the tip of the mushroom off leaving just the rim of the flange in place, and placed this into the colostomy and secured it to the skin with 2-0 Prolene.  My hope is that this will keep the stented open for him and continue to have function of colostomy.  Local anesthesia was infiltrated using a 0.25% Sensorcaine with epinephrine around the ostomy site.  Appliance applied.  All final counts found to be correct.  The patient was extubated, taken to recovery in satisfactory condition.     [name], M.D.     TAC/MEDQ  D:  05/16/2012  T:  05/17/2012  Job:  [id number]

## 2012-05-17 NOTE — Telephone Encounter (Signed)
I called Dr. Luisa Hart re advice given to [name]. He had nothing to add/gy

## 2012-05-21 ENCOUNTER — Telehealth (INDEPENDENT_AMBULATORY_CARE_PROVIDER_SITE_OTHER): Payer: Self-pay

## 2012-05-21 ENCOUNTER — Telehealth (INDEPENDENT_AMBULATORY_CARE_PROVIDER_SITE_OTHER): Payer: Self-pay | Admitting: General Surgery

## 2012-05-21 NOTE — Telephone Encounter (Signed)
Pt's wife called to advise Dr Luisa Hart the tube he had in ostomy has come put. She states she was to call if this occurred. I advised her I will send msg to Dr Luisa Hart and his assistant.

## 2012-05-21 NOTE — Telephone Encounter (Signed)
[facility] called to let us know they got an RX for lortab 10/500 but they no longer make this - they only make 10/325 - per Dr Luisa Hart okay to switch to this and [facility] was made aware. They will call with any other questions.

## 2012-05-21 NOTE — Telephone Encounter (Signed)
Called [name] back to let he know Cornett thought tube would come out and he would be fine to wait and be seen Friday.

## 2012-05-25 ENCOUNTER — Encounter (INDEPENDENT_AMBULATORY_CARE_PROVIDER_SITE_OTHER): Payer: Self-pay | Admitting: Surgery

## 2012-05-25 ENCOUNTER — Ambulatory Visit (INDEPENDENT_AMBULATORY_CARE_PROVIDER_SITE_OTHER): Payer: Self-pay | Admitting: Surgery

## 2012-05-25 VITALS — BP 142/84 | HR 86 | Temp 97.7°F | Resp 18 | Ht 70.0 in | Wt 268.8 lb

## 2012-05-25 DIAGNOSIS — Z9889 Other specified postprocedural states: Secondary | ICD-10-CM

## 2012-05-25 NOTE — Patient Instructions (Signed)
Return 3 weeks.  Dilate ostomy daily.

## 2012-05-25 NOTE — Progress Notes (Signed)
Patient returns with dilation of ostomy. The tube is out staph placed deep and has been dislodged. He looks well overall.  Exam midline wound clean and intact. Ostomy functioning. Poor drain functioning. No abdominal pain. 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  Return to clinic 3 weeks. He will need barium enema prior to takedown of colostomy

## 2012-05-30 ENCOUNTER — Telehealth (INDEPENDENT_AMBULATORY_CARE_PROVIDER_SITE_OTHER): Payer: Self-pay | Admitting: *Deleted

## 2012-05-30 NOTE — Telephone Encounter (Signed)
Carlena Sax from [facility] [phone] ext [phone]) called to make sure the document for [place] MD had been received for medical clearance for equipment.

## 2012-06-04 NOTE — Telephone Encounter (Signed)
Forward message to Dr. Luisa Hart

## 2012-06-05 ENCOUNTER — Other Ambulatory Visit (INDEPENDENT_AMBULATORY_CARE_PROVIDER_SITE_OTHER): Payer: Self-pay

## 2012-06-05 ENCOUNTER — Telehealth (INDEPENDENT_AMBULATORY_CARE_PROVIDER_SITE_OTHER): Payer: Self-pay | Admitting: *Deleted

## 2012-06-05 DIAGNOSIS — G8918 Other acute postprocedural pain: Secondary | ICD-10-CM

## 2012-06-05 MED ORDER — HYDROCODONE-ACETAMINOPHEN 10-500 MG PO TABS: 1.0000 | ORAL_TABLET | ORAL | Status: DC | PRN

## 2012-06-05 NOTE — Telephone Encounter (Signed)
I called Carlena Sax back and told her this was a Dr Luisa Hart patient and to refax it to Dr Cornett's attention and I would have him sign it.

## 2012-06-05 NOTE — Telephone Encounter (Signed)
Morrie Sheldon called to request a refill on the pain medication for [name].  She requested a refill of the Lortab 10/325mg  and asked for the refill to be #50 again so that way she doesn't have to drive to the office as often.  Explained that this prescription request would need to be approved by Dr. Luisa Hart.

## 2012-06-05 NOTE — Telephone Encounter (Signed)
Called ashley and let her know prescription at front desk.

## 2012-06-05 NOTE — Telephone Encounter (Signed)
sure

## 2012-06-06 ENCOUNTER — Other Ambulatory Visit (INDEPENDENT_AMBULATORY_CARE_PROVIDER_SITE_OTHER): Payer: Self-pay

## 2012-06-06 MED ORDER — HYDROCODONE-ACETAMINOPHEN 10-325 MG PO TABS: 1.0000 | ORAL_TABLET | Freq: Four times a day (QID) | ORAL | Status: DC | PRN

## 2012-06-12 ENCOUNTER — Telehealth (INDEPENDENT_AMBULATORY_CARE_PROVIDER_SITE_OTHER): Payer: Self-pay | Admitting: General Surgery

## 2012-06-12 NOTE — Telephone Encounter (Signed)
[name] called for patient stated yesterday patient's suture broke and his drain is coming out. It is still draining but not as much and is draining around his JP drain. He was running a fever of 99.5 yesterday but this has gone away. Per Dr Luisa Hart to order IR to evaluate and possibly exchange patient's drain. I spoke with Inetta Fermo in IR and put in order. She will call patient to get this done asap.

## 2012-06-13 ENCOUNTER — Ambulatory Visit (HOSPITAL_COMMUNITY)
Admission: RE | Admit: 2012-06-13 | Discharge: 2012-06-13 | Disposition: A | Discharge status: Home / Self Care | Payer: Medicaid Other | Source: Ambulatory Visit | Attending: Surgery | Admitting: Surgery

## 2012-06-13 DIAGNOSIS — K651 Peritoneal abscess: Secondary | ICD-10-CM | POA: Insufficient documentation

## 2012-06-13 MED ORDER — IOHEXOL 300 MG/ML  SOLN
25.0000 mL | Freq: Once | INTRAMUSCULAR | Status: AC | PRN
  Administered 2012-06-13: 20 mL

## 2012-06-13 MED ORDER — LIDOCAINE HCL 1 % IJ SOLN
INTRAMUSCULAR | Status: AC
  Filled 2012-06-13: qty 20

## 2012-06-13 NOTE — Procedures (Signed)
Drain injection confirmed that the tube was still well positioned.  Persistent fistula connection to bowel.   The old suture was broken.  The skin around the drain was prepped in sterile fashion and a new Prolene suture was placed.

## 2012-06-19 ENCOUNTER — Ambulatory Visit (INDEPENDENT_AMBULATORY_CARE_PROVIDER_SITE_OTHER): Payer: Self-pay | Admitting: Surgery

## 2012-06-19 ENCOUNTER — Encounter (INDEPENDENT_AMBULATORY_CARE_PROVIDER_SITE_OTHER): Payer: Self-pay | Admitting: Surgery

## 2012-06-19 VITALS — BP 134/86 | HR 74 | Temp 97.4°F | Resp 16 | Ht 70.0 in | Wt 269.0 lb

## 2012-06-19 DIAGNOSIS — G8918 Other acute postprocedural pain: Secondary | ICD-10-CM

## 2012-06-19 DIAGNOSIS — K9403 Colostomy malfunction: Secondary | ICD-10-CM

## 2012-06-19 MED ORDER — PROMETHAZINE HCL 12.5 MG PO TABS: 12.5000 mg | ORAL_TABLET | Freq: Four times a day (QID) | ORAL | Status: DC | PRN

## 2012-06-19 MED ORDER — HYDROCODONE-ACETAMINOPHEN 10-325 MG PO TABS: 1.0000 | ORAL_TABLET | Freq: Four times a day (QID) | ORAL | Status: DC | PRN

## 2012-06-19 NOTE — Patient Instructions (Addendum)
End Colostomy Reversal An end colostomy reversal is surgery that reverses an end colostomy. The large intestine is disconnected from the opening in the abdomen (stoma). It is then reconnected to the large intestine inside the body. A stoma and pouch are no longer needed. Bowel movements can resume through the rectum. LET YOUR CAREGIVER KNOW ABOUT:  Allergies to food or medicine.  Medicines taken, including vitamins, health supplements, herbs, eyedrops, over-the-counter medicines, and creams.  Use of steroids (by mouth or creams).  Previous problems with anesthetics or numbing medicines.  History of bleeding problems or blood clots.  Previous surgery.  Other health problems, including diabetes and kidney problems.  Possibility of pregnancy, if this applies. RISKS AND COMPLICATIONS General surgical complications may include:  Reaction to anesthesia.  Damage to surrounding nerves, tissues, or structures.  Blood clot.  Bleeding.  Scarring. Specific risks for colostomy reversal, while rare, may include:  Intestinal paralysis (ileus). This is a normal part of recovery. It usually goes away in 3 to 7 days. However, it can last longer in some people.  Leaking at the joined part of the intestine (anastamotic leak).  Infection of the surgical cut (incision) or the place where the stoma was located.  A collection of pus (abscess) in the abdomen or pelvis.  Intestinal blockage.  Narrowing at the joined part of the intestine (stricture).  Urinary and sexual dysfunction. BEFORE THE PROCEDURE It is important to follow your surgeon's instructions prior to your procedure. This will help you to avoid complications. Steps before your procedure may include:  A physical exam, rectal exam, X-rays, colonoscopy, and other procedures.  Chemotherapy or radiation therapy if the stoma was created for cancer.  A review of the procedure, the anesthesia being used, and what to expect after the  procedure. You may be asked to:  Stop taking certain medicines for several days prior to your procedure. This may include blood thinners (such as aspirin).  Take certain medicines, such as antibiotics or stool softeners.  Avoid eating and drinking after midnight the night before the procedure. This will help you to avoid complications from the anesthesia.  Quit smoking. Smoking increases the chances of a healing problem after your procedure. PROCEDURE  You will be given medicine that makes you sleep (general anesthetic). The procedure may be done as open surgery, with a large incision. It may also be done as laparoscopic surgery, with several smaller incisions. The surgeon will stitch or staple the intestine ends back together. This surgery takes several hours. AFTER THE PROCEDURE  You will be given pain medicine.  Your caregivers will slowly increase your diet and movement.  You can expect to be in the hospital for about 5 to 10 days.  You should arrange for someone to help you with activities at home while you recover. Document Released: 06/27/2011 Document Reviewed: 06/27/2011 [facility] Patient Information 2013 [place], [place].

## 2012-06-19 NOTE — Progress Notes (Signed)
Patient ID: [name], male   DOB: 18-Dec-1970, 42 y.o.   MRN: [id number]  No chief complaint on file.   HPI [name] is a 42 y.o. male.  Patient returns for followup of his colostomy and enterocutaneous fistula. His Percutaneous  drain had to be readjusted last week.he feels well. His midline abdominal wound is closed up. He's eating well. HPI  Past Medical History  Diagnosis Date  . Urinary anastomotic stricture undiagnosed     hard to be cathed; urology did the last time  . Diverticulitis   . Colon obstruction 01/17/2012  . Obesity, Class III, BMI 40-49.9 (morbid obesity) 01/17/2012    Past Surgical History  Procedure Laterality Date  . Partial colectomy  01/17/2012    Procedure: PARTIAL COLECTOMY;  Surgeon: Clovis Pu. Jordany Russett, MD;  Location: [facility];  Service: General;  Laterality: N/A;  . Colostomy  01/17/2012    Procedure: COLOSTOMY;  Surgeon: Clovis Pu. Kavon Valenza, MD;  Location: [facility];  Service: General;  Laterality: N/A;  . Stoma revision  05/16/2012    Procedure: STOMA REVISION;  Surgeon: Clovis Pu. Terasa Orsini, MD;  Location: [facility];  Service: General;  Laterality: N/A;  Dilation of ostomy    History reviewed. No pertinent family history.  Social History History  Substance Use Topics  . Smoking status: Former Smoker -- 2.00 packs/day for 20 years    Types: Cigarettes    Start date: 01/16/2012  . Smokeless tobacco: Never Used  . Alcohol Use: No    Allergies  Allergen Reactions  . Ivp Dye (Iodinated Diagnostic Agents) Other (See Comments)    Kidney failure    Current Outpatient Prescriptions  Medication Sig Dispense Refill  . HYDROcodone-acetaminophen (NORCO) 10-325 MG per tablet Take 1 tablet by mouth every 6 (six) hours as needed for pain.  50 tablet  0  . polyethylene glycol powder (MIRALAX) powder Take 17 g by mouth 2 (two) times daily.      . promethazine (PHENERGAN) 12.5 MG tablet Take 1 tablet (12.5 mg total) by mouth every 6 (six) hours as needed. For  nausea.  30 tablet  0   No current facility-administered medications for this visit.    Review of Systems Review of Systems  Constitutional: Positive for fatigue.  HENT: Negative.   Eyes: Negative.   Respiratory: Negative.   Cardiovascular: Negative.   Gastrointestinal: Negative.   Endocrine: Negative.   Genitourinary: Negative.   Allergic/Immunologic: Negative.   Neurological: Negative.   Hematological: Negative.   Psychiatric/Behavioral: Negative.     Blood pressure 134/86, pulse 74, temperature 97.4 F (36.3 C), resp. rate 16, height 5\' 10"  (1.778 m), weight 269 lb (122.018 kg).  Physical Exam Physical Exam  Constitutional: He is oriented to person, place, and time. He appears well-developed and well-nourished.  HENT:  Head: Normocephalic and atraumatic.  Eyes: EOM are normal. Pupils are equal, round, and reactive to light.  Neck: Normal range of motion. Neck supple.  Cardiovascular: Normal rate and regular rhythm.   Pulmonary/Chest: Effort normal and breath sounds normal.  Abdominal: There is no tenderness.    Musculoskeletal: Normal range of motion.  Neurological: He is alert and oriented to person, place, and time.  Skin: Skin is warm and dry.  Psychiatric: He has a normal mood and affect. His behavior is normal. Judgment and thought content normal.      Assessment    History of diverticular stricture with emergent surgery for end colostomy. 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    Plan    He is ready for closure of his colostomy. He will require a barium enema per rectum into his colostomy prior to surgery. I discussed the surgery with he and his wife today. Risk of bleeding, infection, organ injury, ureteral injury, abscess, recurrent colostomy, pulmonary complications, cardiovascular complications, death, and the need for other operations discussed. They're well aware of these complications and wish to proceed.        Aiven Kampe A. 06/19/2012, 12:13 PM

## 2012-06-27 ENCOUNTER — Telehealth (INDEPENDENT_AMBULATORY_CARE_PROVIDER_SITE_OTHER): Payer: Self-pay

## 2012-06-27 NOTE — Telephone Encounter (Signed)
Pts wife called stating due to power being out they were unable to do dilatation of stoma. She states she is unable to do dilatation. Ostomy functioning but is having some discomfort. I  Advised her Dr Luisa Hart is not in office this afternoon. Pt given appt and advised to bring dilators with him and come to office tomorrow to see Dr Luisa Hart. If ostomy stops working pt needs to call immediately or go to ER. She states she understands.

## 2012-06-28 ENCOUNTER — Encounter (INDEPENDENT_AMBULATORY_CARE_PROVIDER_SITE_OTHER): Payer: Self-pay | Admitting: Surgery

## 2012-06-28 ENCOUNTER — Ambulatory Visit (INDEPENDENT_AMBULATORY_CARE_PROVIDER_SITE_OTHER): Payer: Self-pay | Admitting: Surgery

## 2012-06-28 VITALS — BP 132/86 | HR 100 | Temp 97.6°F | Resp 18 | Ht 70.0 in | Wt 267.8 lb

## 2012-06-28 DIAGNOSIS — Z9889 Other specified postprocedural states: Secondary | ICD-10-CM

## 2012-06-28 NOTE — Patient Instructions (Signed)
Dilate 3 times a day.

## 2012-06-28 NOTE — Progress Notes (Signed)
Patient returns do to stenosis of his ostomy. He is unable to dilate it at this point in time.  I recommended using local anesthesia number in the office and she agreed to. After prepping sterilely 10  Cc of 1 % lidocaine  a was injected around the ostomy orifice. I used up to a 32 [place] dilator to dilate and a hemostat. The procedure was tolerated well. He is scheduled for barium enema tomorrow and we are in the process of getting him financial assistance to begin to pay for his hospital bills in order to schedule surgery which is elective to close his ostomy and takedown this intracutaneous fistula. Return to clinic in 2 weeks. He did well with an office dilation.

## 2012-06-29 ENCOUNTER — Other Ambulatory Visit (INDEPENDENT_AMBULATORY_CARE_PROVIDER_SITE_OTHER): Payer: Self-pay | Admitting: Surgery

## 2012-06-29 ENCOUNTER — Ambulatory Visit
Admission: RE | Admit: 2012-06-29 | Discharge: 2012-06-29 | Disposition: A | Discharge status: Home / Self Care | Payer: No Typology Code available for payment source | Source: Ambulatory Visit | Attending: Surgery | Admitting: Surgery

## 2012-07-02 ENCOUNTER — Other Ambulatory Visit (INDEPENDENT_AMBULATORY_CARE_PROVIDER_SITE_OTHER): Payer: Self-pay

## 2012-07-02 ENCOUNTER — Telehealth (INDEPENDENT_AMBULATORY_CARE_PROVIDER_SITE_OTHER): Payer: Self-pay | Admitting: General Surgery

## 2012-07-02 MED ORDER — HYDROCODONE-ACETAMINOPHEN 10-325 MG PO TABS: 1.0000 | ORAL_TABLET | Freq: Four times a day (QID) | ORAL | Status: DC | PRN

## 2012-07-02 NOTE — Telephone Encounter (Signed)
Patient calling for refill of his pain medication. He is status post diverticular stricture with emergent surgery for end colostomy with enterocutaneous fistula requiring percutaneous drain. Hydrocodone 10/325. Please advise. [phone].

## 2012-07-02 NOTE — Telephone Encounter (Signed)
Pt called for refill of hydrocodone 10/325. Per Dr Luisa Hart ok.

## 2012-07-02 NOTE — Telephone Encounter (Signed)
I called [name] to let her know prescription is at front desk for pick up.

## 2012-07-13 ENCOUNTER — Ambulatory Visit (INDEPENDENT_AMBULATORY_CARE_PROVIDER_SITE_OTHER): Payer: Self-pay | Admitting: Surgery

## 2012-07-13 ENCOUNTER — Encounter (INDEPENDENT_AMBULATORY_CARE_PROVIDER_SITE_OTHER): Payer: Self-pay | Admitting: Surgery

## 2012-07-13 VITALS — BP 142/86 | HR 72 | Temp 98.4°F | Resp 16 | Ht 70.0 in | Wt 275.2 lb

## 2012-07-13 DIAGNOSIS — K632 Fistula of intestine: Secondary | ICD-10-CM

## 2012-07-13 DIAGNOSIS — G8918 Other acute postprocedural pain: Secondary | ICD-10-CM

## 2012-07-13 MED ORDER — HYDROCODONE-ACETAMINOPHEN 10-325 MG PO TABS: 1.0000 | ORAL_TABLET | Freq: Four times a day (QID) | ORAL | Status: DC | PRN

## 2012-07-13 NOTE — Patient Instructions (Signed)
Return 2 weeks.

## 2012-07-13 NOTE — Progress Notes (Signed)
Patient returns in followup of his colostomy stenosis and enterocutaneous fistula. He feels okay. He does have a lot of chronic abdominal pain and nausea. Ostomy functioning well though. He has hired an Pensions consultant to help with his hospital bills. Hopefully he will get Medicaid. His father accompanies his wife today. He is able to dilate his colostomy on his own.  Exam: Wound well-healed. Ostomy functioning well. Drain drainage is less than 100 cc a day.  Impression: History of diverticulitis with sigmoid colon stricture and large bowel obstruction status post exploratory laparotomy in October 2013 with creation of colostomy complicated by retroperitoneal abscesses secondary enterocutaneous fistula to other small diverticuli in the colon  Plan: I discussed with his father the financial issues here. He'll need to have the attorney's review his records to see if he gets any support financially for Medicaid etc. He has not paid his hospital bills due to the large cost. Once the plan is in place, we can schedule surgery. I will see him back in 2 weeks to see how he is doing antibiotic colostomy if needed. He is medically stable at this point in time and hopefully we can work out some the details to get the finances in order. I refill his pain medication and Phenergan.

## 2012-07-24 ENCOUNTER — Telehealth (INDEPENDENT_AMBULATORY_CARE_PROVIDER_SITE_OTHER): Payer: Self-pay | Admitting: General Surgery

## 2012-07-24 NOTE — Telephone Encounter (Signed)
Morrie Sheldon called for patient and requested a refill of hydrocodone 10/325 - patient will run out prior to appt on Friday. Please advise. Dr Luisa Hart paged. He approved refill of hydrocodone - this was called to [facility]. #50 with no refills. Morrie Sheldon made aware this has been called in. Will let us know if there are any problems.

## 2012-07-27 ENCOUNTER — Encounter (INDEPENDENT_AMBULATORY_CARE_PROVIDER_SITE_OTHER): Payer: Self-pay | Admitting: Surgery

## 2012-07-27 ENCOUNTER — Ambulatory Visit (INDEPENDENT_AMBULATORY_CARE_PROVIDER_SITE_OTHER): Payer: Self-pay | Admitting: Surgery

## 2012-07-27 VITALS — BP 110/74 | HR 88 | Temp 98.5°F | Resp 18 | Ht 70.0 in | Wt 274.0 lb

## 2012-07-27 DIAGNOSIS — Z933 Colostomy status: Secondary | ICD-10-CM

## 2012-07-27 NOTE — Patient Instructions (Signed)
End Colostomy Reversal An end colostomy reversal is surgery that reverses an end colostomy. The large intestine is disconnected from the opening in the abdomen (stoma). It is then reconnected to the large intestine inside the body. A stoma and pouch are no longer needed. Bowel movements can resume through the rectum. LET YOUR CAREGIVER KNOW ABOUT:  Allergies to food or medicine.  Medicines taken, including vitamins, health supplements, herbs, eyedrops, over-the-counter medicines, and creams.  Use of steroids (by mouth or creams).  Previous problems with anesthetics or numbing medicines.  History of bleeding problems or blood clots.  Previous surgery.  Other health problems, including diabetes and kidney problems.  Possibility of pregnancy, if this applies. RISKS AND COMPLICATIONS General surgical complications may include:  Reaction to anesthesia.  Damage to surrounding nerves, tissues, or structures.  Blood clot.  Bleeding.  Scarring. Specific risks for colostomy reversal, while rare, may include:  Intestinal paralysis (ileus). This is a normal part of recovery. It usually goes away in 3 to 7 days. However, it can last longer in some people.  Leaking at the joined part of the intestine (anastamotic leak).  Infection of the surgical cut (incision) or the place where the stoma was located.  A collection of pus (abscess) in the abdomen or pelvis.  Intestinal blockage.  Narrowing at the joined part of the intestine (stricture).  Urinary and sexual dysfunction. BEFORE THE PROCEDURE It is important to follow your surgeon's instructions prior to your procedure. This will help you to avoid complications. Steps before your procedure may include:  A physical exam, rectal exam, X-rays, colonoscopy, and other procedures.  Chemotherapy or radiation therapy if the stoma was created for cancer.  A review of the procedure, the anesthesia being used, and what to expect after the  procedure. You may be asked to:  Stop taking certain medicines for several days prior to your procedure. This may include blood thinners (such as aspirin).  Take certain medicines, such as antibiotics or stool softeners.  Avoid eating and drinking after midnight the night before the procedure. This will help you to avoid complications from the anesthesia.  Quit smoking. Smoking increases the chances of a healing problem after your procedure. PROCEDURE  You will be given medicine that makes you sleep (general anesthetic). The procedure may be done as open surgery, with a large incision. It may also be done as laparoscopic surgery, with several smaller incisions. The surgeon will stitch or staple the intestine ends back together. This surgery takes several hours. AFTER THE PROCEDURE  You will be given pain medicine.  Your caregivers will slowly increase your diet and movement.  You can expect to be in the hospital for about 5 to 10 days.  You should arrange for someone to help you with activities at home while you recover. Document Released: 06/27/2011 Document Reviewed: 06/27/2011 [employer] Patient Information 2013 [employer].  

## 2012-07-27 NOTE — Progress Notes (Signed)
Patient ID: [name], male   DOB: [birth year], 42 y.o.   MRN: [id number]  No chief complaint on file.   HPI [name] is a 42 y.o. male.  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 He feels well no complaints. He is ready to schedule colostomy closure. He is gaining weight and his colostomy is doing well. HPI  Past Medical History  Diagnosis Date  . Urinary anastomotic stricture undiagnosed     hard to be cathed; urology did the last time  . Diverticulitis   . Colon obstruction 01/17/2012  . Obesity, Class III, BMI 40-49.9 (morbid obesity) 01/17/2012    Past Surgical History  Procedure Laterality Date  . Partial colectomy  01/17/2012    Procedure: PARTIAL COLECTOMY;  Surgeon: Jeffrey Pu. Clarke Peretz, MD;  Location: [facility];  Service: General;  Laterality: N/A;  . Colostomy  01/17/2012    Procedure: COLOSTOMY;  Surgeon: Jeffrey Pu. Yanelli Zapanta, MD;  Location: [facility];  Service: General;  Laterality: N/A;  . Stoma revision  05/16/2012    Procedure: STOMA REVISION;  Surgeon: Jeffrey Pu. Brita Jurgensen, MD;  Location: [facility];  Service: General;  Laterality: N/A;  Dilation of ostomy    History reviewed. No pertinent family history.  Social History History  Substance Use Topics  . Smoking status: Former Smoker -- 2.00 packs/day for 20 years    Types: Cigarettes    Start date: 01/16/2012  . Smokeless tobacco: Never Used  . Alcohol Use: No    Allergies  Allergen Reactions  . Ivp Dye (Iodinated Diagnostic Agents) Other (See Comments)    Kidney failure    Current Outpatient Prescriptions  Medication Sig Dispense Refill  . HYDROcodone-acetaminophen (NORCO) 10-325 MG per tablet Take 1 tablet by mouth every 6 (six) hours as needed for pain.  50 tablet  0  . HYDROcodone-acetaminophen (NORCO) 10-325 MG per tablet Take 1 tablet by mouth every 6 (six) hours as needed for pain.  50 tablet  0  . polyethylene glycol powder (MIRALAX) powder Take 17 g by mouth 2 (two) times  daily.      . promethazine (PHENERGAN) 12.5 MG tablet Take 1 tablet (12.5 mg total) by mouth every 6 (six) hours as needed. For nausea.  30 tablet  0   No current facility-administered medications for this visit.    Review of Systems Review of Systems  Constitutional: Negative.   HENT: Negative.   Respiratory: Negative.   Cardiovascular: Negative.     Blood pressure 110/74, pulse 88, temperature 98.5 F (36.9 C), resp. rate 18, height 5\' 10"  (1.778 m), weight 274 lb (124.286 kg).  Physical Exam Physical Exam  Constitutional: He is oriented to person, place, and time. He appears well-developed.  Eyes: EOM are normal. Pupils are equal, round, and reactive to light.  Abdominal:    Musculoskeletal: Normal range of motion.  Neurological: He is alert and oriented to person, place, and time.  Skin: Skin is warm.  Psychiatric: He has a normal mood and affect. His behavior is normal. Thought content normal.    Data Reviewed Clinical Data: Preop for colostomy closure  BARIUM ENEMA THROUGH COLOSTOMY  Contrast: Single column barium  Comparison: CT abdomen pelvis of 01/23/2012 and fistula injection  of 06/13/2012  Findings: A preliminary film of the abdomen shows a nonspecific  bowel gas pattern. A pigtail drainage catheter overlies the left  upper quadrant.  Barium entered the rectum with no obstruction. The patient did  have some  discomfort upon distending the rectum, and the best study  possible was performed. The descending colon extends to a point  just below the pigtail catheter. However there is a linear  fistulous communication with the mid descending colon and the  abscess cavity drained by catheter.  Barium was then administered via the ostomy. The proximal colon is  moderately well visualized with no gross abnormality. Obviously  there is retained feces which makes exclusion of small polypoid  lesions difficult. On the postevacuation film no significant  abnormality is  seen.  IMPRESSION:  1. There is a fistulous communication with the mid descending  colon and the cavity drained by the pig tail catheter in the left  abdomen.  2. The more proximal colon is unremarkable although there is some  feces present.  Original Report Authenticated By: [name], M.D.    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    Plan    He is ready for closure of his colostomy at this point in time. He is asked to gain weight and I feel that his catabolic time has resolved and his wounds have healed adequately. I feel that completion colectomy given the fact that he has a fistula proximal to the colostomy and other diverticular changes make A. Richardson Dopp oh rectal anastomosis difficult. I discussed removing the remainder of his colon except for what is distal and this appears healthy by barium enema. He may have more bowel movements but he is okay with that I think this would reduce his risk of other complications.The procedure was discussed with the patient.  Completion abdominal colectomy and closure of colostomy discussed. The risks of operative management include bleeding,  Infection,  Leak of anastamosis,  Ostomy formation, open procedure,  Sepsis,  Abcess,  Hernia,  DVT,  Pulmonary complications,  Cardiovascular  complications,  Injury to ureter,  Bladder,kidney,and anesthesia risks,  And death. The patient understands.  Questions answered.   The success of the procedure is 50-100 % for treating the patients symptoms. They agree to proceed.       Jujuan Dugo A. 07/27/2012, 11:21 AM

## 2012-08-06 ENCOUNTER — Telehealth (INDEPENDENT_AMBULATORY_CARE_PROVIDER_SITE_OTHER): Payer: Self-pay | Admitting: *Deleted

## 2012-08-06 ENCOUNTER — Encounter (INDEPENDENT_AMBULATORY_CARE_PROVIDER_SITE_OTHER): Payer: Self-pay | Admitting: *Deleted

## 2012-08-06 NOTE — Telephone Encounter (Signed)
Cornett MD called back and approved patient for Hydrocodone 10/325mg  1 tablet every 6 hours as needed for pain. #30 no refills.  Called into [place] [phone].

## 2012-08-06 NOTE — Telephone Encounter (Signed)
Morrie Sheldon called to request another refill of patients Hydrocodone 10-325mg .  Cornett MD paged at this time, awaiting response.

## 2012-08-08 ENCOUNTER — Other Ambulatory Visit (HOSPITAL_COMMUNITY): Payer: Self-pay | Admitting: *Deleted

## 2012-08-08 NOTE — Patient Instructions (Addendum)
[name]  08/08/2012                           YOUR PROCEDURE IS SCHEDULED ON: 08/23/12               PLEASE REPORT TO SHORT STAY CENTER AT : 5:15 am               CALL THIS NUMBER IF ANY PROBLEMS THE DAY OF SURGERY :               [phone]                      REMEMBER:   Do not eat food or drink liquids AFTER MIDNIGHT   Take these medicines the morning of surgery with A SIP OF WATER:  NORCO / PHENERGAN IF NEEDED   Do not wear jewelry, make-up   Do not wear lotions, powders, or perfumes.   Do not shave legs or underarms 12 hrs. before surgery (men may shave face)  Do not bring valuables to the hospital.  Contacts, dentures or bridgework may not be worn into surgery.  Leave suitcase in the car. After surgery it may be brought to your room.  For patients admitted to the hospital more than one night, checkout time is 11:00                          The day of discharge.   Patients discharged the day of surgery will not be allowed to drive home                             If going home same day of surgery, must have someone stay with you first                           24 hrs at home and arrange for some one to drive you home from hospital.    Special Instructions:   Please read over the following fact sheets that you were given:               1. MRSA  INFORMATION                      2. [facility] PREPARING FOR SURGERY SHEET               3. FOLLOW BOWEL PREP INSTRUCTIONS               4.NO ASPIRIN OR HERBAL MEDICATION 5-7 DAYS PREOP                                                X_____________________________________________________________________        Failure to follow these instructions may result in cancellation of your surgery

## 2012-08-09 ENCOUNTER — Encounter (HOSPITAL_COMMUNITY): Payer: Self-pay | Admitting: Pharmacy Technician

## 2012-08-10 ENCOUNTER — Encounter (HOSPITAL_COMMUNITY): Payer: Self-pay

## 2012-08-10 ENCOUNTER — Encounter (HOSPITAL_COMMUNITY)
Admission: RE | Admit: 2012-08-10 | Discharge: 2012-08-10 | Disposition: A | Discharge status: Home / Self Care | Payer: Medicaid Other | Source: Ambulatory Visit | Attending: Surgery | Admitting: Surgery

## 2012-08-10 HISTORY — DX: Personal history of urinary calculi: Z87.442

## 2012-08-10 HISTORY — DX: Colostomy status: Z93.3

## 2012-08-10 HISTORY — DX: Other complications of anesthesia, initial encounter: T88.59XA

## 2012-08-10 HISTORY — DX: Insomnia, unspecified: G47.00

## 2012-08-10 HISTORY — DX: Adverse effect of unspecified anesthetic, initial encounter: T41.45XA

## 2012-08-10 HISTORY — DX: Personal history of other endocrine, nutritional and metabolic disease: Z86.39

## 2012-08-10 LAB — SURGICAL PCR SCREEN: MRSA, PCR: NEGATIVE

## 2012-08-10 LAB — COMPREHENSIVE METABOLIC PANEL
ALT: 42 U/L (ref 0–53)
AST: 25 U/L (ref 0–37)
Albumin: 3.6 g/dL (ref 3.5–5.2)
CO2: 26 mEq/L (ref 19–32)
Chloride: 101 mEq/L (ref 96–112)
Creatinine, Ser: 0.68 mg/dL (ref 0.50–1.35)
GFR calc non Af Amer: >90 mL/min (ref >90)
Sodium: 136 mEq/L (ref 135–145)
Total Bilirubin: 0.2 mg/dL — ABNORMAL LOW (ref 0.3–1.2)

## 2012-08-10 LAB — CBC WITH DIFFERENTIAL/PLATELET
Basophils Absolute: 0.1 10*3/uL (ref 0.0–0.1)
Basophils Relative: 1 % (ref 0–1)
Lymphocytes Relative: 16 % (ref 12–46)
MCHC: 34.1 g/dL (ref 30.0–36.0)
Monocytes Absolute: 1 10*3/uL (ref 0.1–1.0)
Neutro Abs: 9.7 10*3/uL — ABNORMAL HIGH (ref 1.7–7.7)
Neutrophils Relative %: 73 % (ref 43–77)
Platelets: 448 10*3/uL — ABNORMAL HIGH (ref 150–400)
RDW: 14.2 % (ref 11.5–15.5)
WBC: 13.2 10*3/uL — ABNORMAL HIGH (ref 4.0–10.5)

## 2012-08-13 ENCOUNTER — Telehealth (INDEPENDENT_AMBULATORY_CARE_PROVIDER_SITE_OTHER): Payer: Self-pay | Admitting: General Surgery

## 2012-08-13 NOTE — Telephone Encounter (Signed)
[facility] called to verify the MD who wrote the prescription for neomycin and erythromycin and now many of each. Verified Dr Luisa Hart wrote the RX and it was #3 of each with no refills. They will call with any additional questions.

## 2012-08-22 ENCOUNTER — Telehealth (INDEPENDENT_AMBULATORY_CARE_PROVIDER_SITE_OTHER): Payer: Self-pay

## 2012-08-22 NOTE — Telephone Encounter (Signed)
Patient stoma is closed Patient unsure to start  miralax  At 10:00am for his surgery scheduled for 08/23/12. Advised DO Not start Miralax  go to the ER with his dilator. MD will evaluate and determine what should be done.

## 2012-08-22 NOTE — Telephone Encounter (Signed)
Routing to DR. Cornett

## 2012-08-23 ENCOUNTER — Ambulatory Visit (HOSPITAL_COMMUNITY): Payer: Medicaid Other | Admitting: *Deleted

## 2012-08-23 ENCOUNTER — Encounter (HOSPITAL_COMMUNITY): Payer: Self-pay | Admitting: *Deleted

## 2012-08-23 ENCOUNTER — Inpatient Hospital Stay (HOSPITAL_COMMUNITY)
Admission: RE | Admit: 2012-08-23 | Discharge: 2012-09-05 | DRG: 330 | Disposition: A | Discharge status: Home / Self Care | Payer: Medicaid Other | Source: Ambulatory Visit | Attending: Surgery | Admitting: Surgery

## 2012-08-23 ENCOUNTER — Encounter (HOSPITAL_COMMUNITY)
Admission: RE | Disposition: A | Discharge status: Home / Self Care | Payer: Self-pay | Source: Ambulatory Visit | Attending: Surgery

## 2012-08-23 DIAGNOSIS — E46 Unspecified protein-calorie malnutrition: Secondary | ICD-10-CM | POA: Diagnosis present

## 2012-08-23 DIAGNOSIS — E781 Pure hyperglyceridemia: Secondary | ICD-10-CM

## 2012-08-23 DIAGNOSIS — K560 Paralytic ileus: Secondary | ICD-10-CM | POA: Diagnosis present

## 2012-08-23 DIAGNOSIS — L02429 Furuncle of limb, unspecified: Secondary | ICD-10-CM | POA: Diagnosis present

## 2012-08-23 DIAGNOSIS — Y833 Surgical operation with formation of external stoma as the cause of abnormal reaction of the patient, or of later complication, without mention of misadventure at the time of the procedure: Secondary | ICD-10-CM | POA: Diagnosis present

## 2012-08-23 DIAGNOSIS — Z9889 Other specified postprocedural states: Secondary | ICD-10-CM

## 2012-08-23 DIAGNOSIS — Z6841 Body Mass Index (BMI) 40.0 and over, adult: Secondary | ICD-10-CM

## 2012-08-23 DIAGNOSIS — K632 Fistula of intestine: Principal | ICD-10-CM | POA: Diagnosis present

## 2012-08-23 DIAGNOSIS — IMO0002 Reserved for concepts with insufficient information to code with codable children: Secondary | ICD-10-CM

## 2012-08-23 DIAGNOSIS — K5732 Diverticulitis of large intestine without perforation or abscess without bleeding: Secondary | ICD-10-CM | POA: Diagnosis present

## 2012-08-23 DIAGNOSIS — K660 Peritoneal adhesions (postprocedural) (postinfection): Secondary | ICD-10-CM | POA: Diagnosis present

## 2012-08-23 DIAGNOSIS — K5900 Constipation, unspecified: Secondary | ICD-10-CM

## 2012-08-23 DIAGNOSIS — K9413 Enterostomy malfunction: Secondary | ICD-10-CM | POA: Diagnosis present

## 2012-08-23 DIAGNOSIS — Y838 Other surgical procedures as the cause of abnormal reaction of the patient, or of later complication, without mention of misadventure at the time of the procedure: Secondary | ICD-10-CM | POA: Diagnosis present

## 2012-08-23 DIAGNOSIS — G8918 Other acute postprocedural pain: Secondary | ICD-10-CM | POA: Diagnosis present

## 2012-08-23 DIAGNOSIS — K9403 Colostomy malfunction: Secondary | ICD-10-CM | POA: Diagnosis present

## 2012-08-23 DIAGNOSIS — Z01812 Encounter for preprocedural laboratory examination: Secondary | ICD-10-CM | POA: Diagnosis not present

## 2012-08-23 DIAGNOSIS — K929 Disease of digestive system, unspecified: Secondary | ICD-10-CM | POA: Diagnosis present

## 2012-08-23 DIAGNOSIS — K56699 Other intestinal obstruction unspecified as to partial versus complete obstruction: Secondary | ICD-10-CM | POA: Diagnosis present

## 2012-08-23 DIAGNOSIS — E876 Hypokalemia: Secondary | ICD-10-CM | POA: Diagnosis not present

## 2012-08-23 DIAGNOSIS — K432 Incisional hernia without obstruction or gangrene: Secondary | ICD-10-CM | POA: Diagnosis present

## 2012-08-23 DIAGNOSIS — Z79899 Other long term (current) drug therapy: Secondary | ICD-10-CM

## 2012-08-23 DIAGNOSIS — K5792 Diverticulitis of intestine, part unspecified, without perforation or abscess without bleeding: Secondary | ICD-10-CM

## 2012-08-23 DIAGNOSIS — Z720 Tobacco use: Secondary | ICD-10-CM

## 2012-08-23 DIAGNOSIS — R109 Unspecified abdominal pain: Secondary | ICD-10-CM

## 2012-08-23 DIAGNOSIS — S31109A Unspecified open wound of abdominal wall, unspecified quadrant without penetration into peritoneal cavity, initial encounter: Secondary | ICD-10-CM

## 2012-08-23 HISTORY — PX: COLECTOMY: SHX59

## 2012-08-23 HISTORY — DX: Peritoneal abscess: K65.1

## 2012-08-23 HISTORY — PX: WOUND DEBRIDEMENT: SHX247

## 2012-08-23 HISTORY — PX: APPENDECTOMY: SHX54

## 2012-08-23 HISTORY — PX: OSTOMY: SHX5997

## 2012-08-23 HISTORY — PX: APPLICATION OF WOUND VAC: SHX5189

## 2012-08-23 LAB — CBC WITH DIFFERENTIAL/PLATELET
HCT: 46.2 % (ref 39.0–52.0)
Hemoglobin: 16.1 g/dL (ref 13.0–17.0)
Lymphs Abs: 2.3 10*3/uL (ref 0.7–4.0)
MCH: 31.5 pg (ref 26.0–34.0)
Monocytes Absolute: 1 10*3/uL (ref 0.1–1.0)
Monocytes Relative: 8 % (ref 3–12)
Neutro Abs: 9.4 10*3/uL — ABNORMAL HIGH (ref 1.7–7.7)
Neutrophils Relative %: 71 % (ref 43–77)
RBC: 5.11 MIL/uL (ref 4.22–5.81)

## 2012-08-23 LAB — ABO/RH: ABO/RH(D): A POS

## 2012-08-23 LAB — TYPE AND SCREEN: Antibody Screen: NEGATIVE

## 2012-08-23 LAB — COMPREHENSIVE METABOLIC PANEL
Alkaline Phosphatase: 96 U/L (ref 39–117)
BUN: 9 mg/dL (ref 6–23)
Chloride: 99 mEq/L (ref 96–112)
Creatinine, Ser: 0.74 mg/dL (ref 0.50–1.35)
GFR calc Af Amer: >90 mL/min (ref >90)
GFR calc non Af Amer: >90 mL/min (ref >90)
Glucose, Bld: 120 mg/dL — ABNORMAL HIGH (ref 70–99)
Potassium: 3.5 mEq/L (ref 3.5–5.1)
Total Bilirubin: 0.4 mg/dL (ref 0.3–1.2)

## 2012-08-23 SURGERY — COLECTOMY, TOTAL
Anesthesia: General | Site: Thigh | Wound class: Dirty or Infected

## 2012-08-23 MED ORDER — LIDOCAINE HCL (CARDIAC) 20 MG/ML IV SOLN
INTRAVENOUS | Status: DC | PRN
  Administered 2012-08-23: 75 mg via INTRAVENOUS

## 2012-08-23 MED ORDER — KETOROLAC TROMETHAMINE 15 MG/ML IJ SOLN
15.0000 mg | Freq: Four times a day (QID) | INTRAMUSCULAR | Status: AC | PRN
  Administered 2012-08-24 – 2012-08-28 (×12): 15 mg via INTRAVENOUS
  Filled 2012-08-23 (×12): qty 1

## 2012-08-23 MED ORDER — DEXTROSE 5 % IV SOLN
2.0000 g | INTRAVENOUS | Status: AC
  Administered 2012-08-23: 2 g via INTRAVENOUS
  Filled 2012-08-23: qty 2

## 2012-08-23 MED ORDER — MIDAZOLAM HCL 5 MG/5ML IJ SOLN
INTRAMUSCULAR | Status: DC | PRN
  Administered 2012-08-23 (×2): 1 mg via INTRAVENOUS

## 2012-08-23 MED ORDER — HYDROMORPHONE 0.3 MG/ML IV SOLN
INTRAVENOUS | Status: AC
  Administered 2012-08-24: 5.26 mg
  Filled 2012-08-23: qty 25

## 2012-08-23 MED ORDER — PROMETHAZINE HCL 25 MG/ML IJ SOLN: 6.2500 mg | INTRAMUSCULAR | Status: DC | PRN

## 2012-08-23 MED ORDER — SODIUM CHLORIDE 0.9 % IR SOLN
Status: DC | PRN
  Administered 2012-08-23: 2000 mL

## 2012-08-23 MED ORDER — 0.9 % SODIUM CHLORIDE (POUR BTL) OPTIME
TOPICAL | Status: DC | PRN
  Administered 2012-08-23: 5000 mL

## 2012-08-23 MED ORDER — LACTATED RINGERS IV SOLN: INTRAVENOUS | Status: DC

## 2012-08-23 MED ORDER — SODIUM CHLORIDE 0.9 % IV SOLN
1.0000 g | INTRAVENOUS | Status: AC
  Administered 2012-08-23: 1 g via INTRAVENOUS

## 2012-08-23 MED ORDER — ONDANSETRON HCL 4 MG/2ML IJ SOLN
4.0000 mg | Freq: Four times a day (QID) | INTRAMUSCULAR | Status: DC | PRN
  Administered 2012-08-30: 4 mg via INTRAVENOUS
  Filled 2012-08-23 (×11): qty 2

## 2012-08-23 MED ORDER — ENOXAPARIN SODIUM 40 MG/0.4ML ~~LOC~~ SOLN
40.0000 mg | SUBCUTANEOUS | Status: DC
  Administered 2012-08-24 – 2012-09-05 (×13): 40 mg via SUBCUTANEOUS
  Filled 2012-08-23 (×14): qty 0.4

## 2012-08-23 MED ORDER — CISATRACURIUM BESYLATE (PF) 10 MG/5ML IV SOLN
INTRAVENOUS | Status: DC | PRN
  Administered 2012-08-23: 2 mg via INTRAVENOUS
  Administered 2012-08-23: 4 mg via INTRAVENOUS
  Administered 2012-08-23: 6 mg via INTRAVENOUS
  Administered 2012-08-23 (×3): 2 mg via INTRAVENOUS
  Administered 2012-08-23: 10 mg via INTRAVENOUS
  Administered 2012-08-23 (×3): 2 mg via INTRAVENOUS
  Administered 2012-08-23: 1 mg via INTRAVENOUS
  Administered 2012-08-23: 4 mg via INTRAVENOUS
  Administered 2012-08-23: 1 mg via INTRAVENOUS
  Administered 2012-08-23: 2 mg via INTRAVENOUS

## 2012-08-23 MED ORDER — HYDROMORPHONE HCL PF 1 MG/ML IJ SOLN
INTRAMUSCULAR | Status: DC | PRN
  Administered 2012-08-23: 2 mg via INTRAVENOUS

## 2012-08-23 MED ORDER — ONDANSETRON HCL 4 MG/2ML IJ SOLN
4.0000 mg | Freq: Four times a day (QID) | INTRAMUSCULAR | Status: DC | PRN
  Administered 2012-08-24 – 2012-09-04 (×19): 4 mg via INTRAVENOUS
  Filled 2012-08-23 (×10): qty 2

## 2012-08-23 MED ORDER — ONDANSETRON HCL 4 MG PO TABS: 4.0000 mg | ORAL_TABLET | Freq: Four times a day (QID) | ORAL | Status: DC | PRN

## 2012-08-23 MED ORDER — ONDANSETRON HCL 4 MG/2ML IJ SOLN
INTRAMUSCULAR | Status: DC | PRN
  Administered 2012-08-23 (×4): 2 mg via INTRAVENOUS

## 2012-08-23 MED ORDER — MEPERIDINE HCL 50 MG/ML IJ SOLN: 6.2500 mg | INTRAMUSCULAR | Status: DC | PRN

## 2012-08-23 MED ORDER — LACTATED RINGERS IV SOLN
INTRAVENOUS | Status: DC | PRN
  Administered 2012-08-23 (×7): via INTRAVENOUS

## 2012-08-23 MED ORDER — ALVIMOPAN 12 MG PO CAPS
12.0000 mg | ORAL_CAPSULE | Freq: Two times a day (BID) | ORAL | Status: AC
  Administered 2012-08-24 – 2012-08-30 (×14): 12 mg via ORAL
  Filled 2012-08-23 (×14): qty 1

## 2012-08-23 MED ORDER — CHLORHEXIDINE GLUCONATE 0.12 % MT SOLN
15.0000 mL | Freq: Two times a day (BID) | OROMUCOSAL | Status: DC
  Administered 2012-08-23 – 2012-08-30 (×14): 15 mL via OROMUCOSAL
  Filled 2012-08-23 (×18): qty 15

## 2012-08-23 MED ORDER — ALBUMIN HUMAN 25 % IV SOLN
INTRAVENOUS | Status: DC | PRN
  Administered 2012-08-23: 11:00:00 via INTRAVENOUS

## 2012-08-23 MED ORDER — SODIUM CHLORIDE 0.9 % IJ SOLN: 9.0000 mL | INTRAMUSCULAR | Status: DC | PRN

## 2012-08-23 MED ORDER — PROPOFOL 10 MG/ML IV EMUL
INTRAVENOUS | Status: DC | PRN
  Administered 2012-08-23: 200 mg via INTRAVENOUS

## 2012-08-23 MED ORDER — FENTANYL CITRATE 0.05 MG/ML IJ SOLN
INTRAMUSCULAR | Status: AC
  Filled 2012-08-23: qty 2

## 2012-08-23 MED ORDER — DIPHENHYDRAMINE HCL 12.5 MG/5ML PO ELIX
12.5000 mg | ORAL_SOLUTION | Freq: Four times a day (QID) | ORAL | Status: DC | PRN
  Administered 2012-08-31: 12.5 mg via ORAL
  Filled 2012-08-23: qty 5

## 2012-08-23 MED ORDER — HYDROMORPHONE 0.3 MG/ML IV SOLN
INTRAVENOUS | Status: DC
  Administered 2012-08-23: 1.7 mg via INTRAVENOUS
  Administered 2012-08-23: 16:00:00 via INTRAVENOUS
  Administered 2012-08-23: 3 mg via INTRAVENOUS
  Administered 2012-08-24: 2.7 mg via INTRAVENOUS
  Administered 2012-08-24: via INTRAVENOUS
  Administered 2012-08-24: 4.96 mg via INTRAVENOUS
  Administered 2012-08-24: 2.4 mg via INTRAVENOUS
  Administered 2012-08-24: 5.26 mg via INTRAVENOUS
  Administered 2012-08-24: 4.07 mg via INTRAVENOUS
  Administered 2012-08-25 (×2): via INTRAVENOUS
  Administered 2012-08-25: 2.7 mg via INTRAVENOUS
  Administered 2012-08-25: 4.5 mg via INTRAVENOUS
  Administered 2012-08-25: 1.95 mg via INTRAVENOUS
  Administered 2012-08-25: 4.2 mg via INTRAVENOUS
  Administered 2012-08-25: 3.3 mg via INTRAVENOUS
  Administered 2012-08-25: 2.65 mg via INTRAVENOUS
  Administered 2012-08-26 (×2): via INTRAVENOUS
  Administered 2012-08-26: 4.2 mg via INTRAVENOUS
  Administered 2012-08-26: 2.7 mg via INTRAVENOUS
  Administered 2012-08-26: 4 mg via INTRAVENOUS
  Administered 2012-08-26: 13:00:00 via INTRAVENOUS
  Administered 2012-08-26: 3 mg via INTRAVENOUS
  Administered 2012-08-26: 2.7 mg via INTRAVENOUS
  Administered 2012-08-26: 4.5 mg via INTRAVENOUS
  Administered 2012-08-27: 22:00:00 via INTRAVENOUS
  Administered 2012-08-27: 3.7 mg via INTRAVENOUS
  Administered 2012-08-27: 7.94 mg via INTRAVENOUS
  Administered 2012-08-27: 14:00:00 via INTRAVENOUS
  Administered 2012-08-27: 3 mg via INTRAVENOUS
  Administered 2012-08-27: 1.5 mg via INTRAVENOUS
  Administered 2012-08-27: 4.5 mg via INTRAVENOUS
  Administered 2012-08-27: 0.753 mg via INTRAVENOUS
  Administered 2012-08-28: 2.1 mg via INTRAVENOUS
  Administered 2012-08-28: 2.7 mg via INTRAVENOUS
  Administered 2012-08-28: 4.2 mg via INTRAVENOUS
  Administered 2012-08-28: 6 mg via INTRAVENOUS
  Administered 2012-08-28: 06:00:00 via INTRAVENOUS
  Administered 2012-08-28: 2.1 mg via INTRAVENOUS
  Administered 2012-08-28: 15:00:00 via INTRAVENOUS
  Administered 2012-08-28: 2.78 mg via INTRAVENOUS
  Administered 2012-08-29: 06:00:00 via INTRAVENOUS
  Administered 2012-08-29: 0.3 mg via INTRAVENOUS
  Administered 2012-08-29: 4.8 mg via INTRAVENOUS
  Administered 2012-08-29: 1.2 mg via INTRAVENOUS
  Administered 2012-08-29: 1.3 mg via INTRAVENOUS
  Administered 2012-08-29: 5.87 mg via INTRAVENOUS
  Administered 2012-08-30: 06:00:00 via INTRAVENOUS
  Administered 2012-08-30: 4.2 mg via INTRAVENOUS
  Administered 2012-08-30: 0.3 mg via INTRAVENOUS
  Administered 2012-08-30: 2.4 mg via INTRAVENOUS
  Administered 2012-08-30: 17:00:00 via INTRAVENOUS
  Administered 2012-08-31: 6.9 mg via INTRAVENOUS
  Administered 2012-08-31: 02:00:00 via INTRAVENOUS
  Administered 2012-08-31: 1.61 mg via INTRAVENOUS
  Filled 2012-08-23 (×19): qty 25

## 2012-08-23 MED ORDER — FENTANYL CITRATE 0.05 MG/ML IJ SOLN
INTRAMUSCULAR | Status: DC | PRN
  Administered 2012-08-23: 25 ug via INTRAVENOUS
  Administered 2012-08-23 (×2): 50 ug via INTRAVENOUS
  Administered 2012-08-23: 25 ug via INTRAVENOUS

## 2012-08-23 MED ORDER — POTASSIUM CHLORIDE IN NACL 20-0.9 MEQ/L-% IV SOLN
INTRAVENOUS | Status: AC
  Filled 2012-08-23: qty 1000

## 2012-08-23 MED ORDER — ESMOLOL HCL 10 MG/ML IV SOLN
INTRAVENOUS | Status: DC | PRN
  Administered 2012-08-23: 5 mg via INTRAVENOUS
  Administered 2012-08-23: 2.5 mg via INTRAVENOUS
  Administered 2012-08-23: 7.5 mg via INTRAVENOUS

## 2012-08-23 MED ORDER — ALVIMOPAN 12 MG PO CAPS
12.0000 mg | ORAL_CAPSULE | Freq: Once | ORAL | Status: AC
  Administered 2012-08-23: 12 mg via ORAL
  Filled 2012-08-23: qty 1

## 2012-08-23 MED ORDER — ALBUMIN HUMAN 25 % IV SOLN
12.5000 g | Freq: Once | INTRAVENOUS | Status: DC
  Filled 2012-08-23: qty 50

## 2012-08-23 MED ORDER — DIPHENHYDRAMINE HCL 50 MG/ML IJ SOLN: 12.5000 mg | Freq: Four times a day (QID) | INTRAMUSCULAR | Status: DC | PRN

## 2012-08-23 MED ORDER — SODIUM CHLORIDE 0.9 % IV SOLN
INTRAVENOUS | Status: AC
  Filled 2012-08-23: qty 1

## 2012-08-23 MED ORDER — ALVIMOPAN 12 MG PO CAPS
12.0000 mg | ORAL_CAPSULE | Freq: Once | ORAL | Status: DC
Start: 2012-08-23 — End: 2012-08-23

## 2012-08-23 MED ORDER — FENTANYL CITRATE 0.05 MG/ML IJ SOLN
25.0000 ug | INTRAMUSCULAR | Status: DC | PRN
  Administered 2012-08-23 (×4): 25 ug via INTRAVENOUS

## 2012-08-23 MED ORDER — LIDOCAINE HCL 4 % MT SOLN
OROMUCOSAL | Status: DC | PRN
  Administered 2012-08-23: 4 mL via TOPICAL

## 2012-08-23 MED ORDER — CEFOXITIN SODIUM 1 G IV SOLR
1.0000 g | Freq: Four times a day (QID) | INTRAVENOUS | Status: AC
  Administered 2012-08-23 – 2012-08-24 (×3): 1 g via INTRAVENOUS
  Filled 2012-08-23 (×3): qty 1

## 2012-08-23 MED ORDER — NALOXONE HCL 0.4 MG/ML IJ SOLN: 0.4000 mg | INTRAMUSCULAR | Status: DC | PRN

## 2012-08-23 MED ORDER — SUCCINYLCHOLINE CHLORIDE 20 MG/ML IJ SOLN
INTRAMUSCULAR | Status: DC | PRN
  Administered 2012-08-23: 140 mg via INTRAVENOUS

## 2012-08-23 MED ORDER — BIOTENE DRY MOUTH MT LIQD
15.0000 mL | Freq: Two times a day (BID) | OROMUCOSAL | Status: DC
  Administered 2012-08-24 – 2012-09-02 (×8): 15 mL via OROMUCOSAL

## 2012-08-23 MED ORDER — SUFENTANIL CITRATE 50 MCG/ML IV SOLN
INTRAVENOUS | Status: DC | PRN
  Administered 2012-08-23: 5 ug via INTRAVENOUS
  Administered 2012-08-23: 20 ug via INTRAVENOUS
  Administered 2012-08-23 (×4): 10 ug via INTRAVENOUS
  Administered 2012-08-23: 5 ug via INTRAVENOUS
  Administered 2012-08-23 (×2): 10 ug via INTRAVENOUS
  Administered 2012-08-23: 20 ug via INTRAVENOUS
  Administered 2012-08-23 (×2): 10 ug via INTRAVENOUS
  Administered 2012-08-23 (×2): 5 ug via INTRAVENOUS

## 2012-08-23 MED ORDER — POTASSIUM CHLORIDE IN NACL 20-0.9 MEQ/L-% IV SOLN
INTRAVENOUS | Status: AC
  Administered 2012-08-23 – 2012-08-25 (×6): via INTRAVENOUS
  Administered 2012-08-26 (×2): 125 mL/h via INTRAVENOUS
  Administered 2012-08-26 – 2012-08-28 (×7): via INTRAVENOUS
  Filled 2012-08-23 (×17): qty 1000

## 2012-08-23 SURGICAL SUPPLY — 61 items
APPLICATOR COTTON TIP 6IN STRL (MISCELLANEOUS) IMPLANT
BLADE EXTENDED COATED 6.5IN (ELECTRODE) IMPLANT
BLADE HEX COATED 2.75 (ELECTRODE) ×8 IMPLANT
BLADE SURG SZ10 CARB STEEL (BLADE) ×4 IMPLANT
CANISTER SUCTION 2500CC (MISCELLANEOUS) ×4 IMPLANT
CLIP TI LARGE 6 (CLIP) IMPLANT
CLOTH BEACON ORANGE TIMEOUT ST (SAFETY) ×4 IMPLANT
COUNTER NEEDLE 20 DBL MAG RED (NEEDLE) ×4 IMPLANT
COVER MAYO STAND STRL (DRAPES) ×8 IMPLANT
COVER SURGICAL LIGHT HANDLE (MISCELLANEOUS) IMPLANT
DRAPE LAPAROSCOPIC ABDOMINAL (DRAPES) ×4 IMPLANT
DRAPE LG THREE QUARTER DISP (DRAPES) IMPLANT
DRAPE WARM FLUID 44X44 (DRAPE) ×4 IMPLANT
DRSG TEGADERM 4X4.75 (GAUZE/BANDAGES/DRESSINGS) ×4 IMPLANT
DRSG VAC ATS LRG SENSATRAC (GAUZE/BANDAGES/DRESSINGS) ×4 IMPLANT
DRSG VAC ATS MED SENSATRAC (GAUZE/BANDAGES/DRESSINGS) ×4 IMPLANT
ELECT REM PT RETURN 9FT ADLT (ELECTROSURGICAL) ×4
ELECTRODE REM PT RTRN 9FT ADLT (ELECTROSURGICAL) ×3 IMPLANT
GLOVE BIOGEL PI IND STRL 7.0 (GLOVE) ×3 IMPLANT
GLOVE BIOGEL PI INDICATOR 7.0 (GLOVE) ×1
GLOVE INDICATOR 8.0 STRL GRN (GLOVE) ×12 IMPLANT
GLOVE SS BIOGEL STRL SZ 8 (GLOVE) ×12 IMPLANT
GLOVE SUPERSENSE BIOGEL SZ 8 (GLOVE) ×4
GOWN STRL NON-REIN LRG LVL3 (GOWN DISPOSABLE) IMPLANT
GOWN STRL REIN XL XLG (GOWN DISPOSABLE) ×32 IMPLANT
KIT BASIN OR (CUSTOM PROCEDURE TRAY) ×8 IMPLANT
LEGGING LITHOTOMY PAIR STRL (DRAPES) ×8 IMPLANT
LIGASURE IMPACT 36 18CM CVD LR (INSTRUMENTS) ×4 IMPLANT
NS IRRIG 1000ML POUR BTL (IV SOLUTION) ×8 IMPLANT
PACK GENERAL/GYN (CUSTOM PROCEDURE TRAY) ×4 IMPLANT
RELOAD PROXIMATE 75MM BLUE (ENDOMECHANICALS) ×28 IMPLANT
SCALPEL HARMONIC ACE (MISCELLANEOUS) IMPLANT
SHEARS FOC LG CVD HARMONIC 17C (MISCELLANEOUS) IMPLANT
SPONGE GAUZE 4X4 12PLY (GAUZE/BANDAGES/DRESSINGS) ×8 IMPLANT
SPONGE LAP 18X18 X RAY DECT (DISPOSABLE) ×8 IMPLANT
STAPLER CIRC CVD 29MM 37CM (STAPLE) ×4 IMPLANT
STAPLER GUN LINEAR PROX 60 (STAPLE) ×4 IMPLANT
STAPLER PROXIMATE 75MM BLUE (STAPLE) ×4 IMPLANT
STAPLER VISISTAT 35W (STAPLE) ×4 IMPLANT
SUCTION POOLE TIP (SUCTIONS) ×4 IMPLANT
SUT NOVA 1 T20/GS 25DT (SUTURE) ×16 IMPLANT
SUT NOVA NAB DX-16 0-1 5-0 T12 (SUTURE) ×12 IMPLANT
SUT PDS AB 1 CTX 36 (SUTURE) IMPLANT
SUT PDS AB 1 TP1 96 (SUTURE) ×8 IMPLANT
SUT PDS AB 3-0 SH 27 (SUTURE) IMPLANT
SUT PDS AB 4-0 SH 27 (SUTURE) IMPLANT
SUT PROLENE 2 0 BLUE (SUTURE) IMPLANT
SUT SILK 2 0 (SUTURE) ×1
SUT SILK 2 0SH CR/8 30 (SUTURE) IMPLANT
SUT SILK 2-0 18XBRD TIE 12 (SUTURE) ×3 IMPLANT
SUT SILK 2-0 30XBRD TIE 12 (SUTURE) IMPLANT
SUT SILK 3 0 (SUTURE) ×1
SUT SILK 3-0 18XBRD TIE 12 (SUTURE) ×3 IMPLANT
SUT VIC AB 2-0 SH 18 (SUTURE) ×4 IMPLANT
SUT VIC AB 3-0 SH 18 (SUTURE) ×4 IMPLANT
SUT VIC AB 3-0 SH 8-18 (SUTURE) ×12 IMPLANT
TAPE CLOTH SURG 4X10 WHT LF (GAUZE/BANDAGES/DRESSINGS) ×4 IMPLANT
TISSUE MATRIX STRATTICE 10X10 (Tissue) ×4 IMPLANT
TOWEL OR 17X26 10 PK STRL BLUE (TOWEL DISPOSABLE) ×8 IMPLANT
TRAY FOLEY CATH 14FRSI W/METER (CATHETERS) ×4 IMPLANT
YANKAUER SUCT BULB TIP NO VENT (SUCTIONS) IMPLANT

## 2012-08-23 NOTE — Preoperative (Signed)
Beta Blockers   Reason not to administer Beta Blockers:Not Applicable 

## 2012-08-23 NOTE — Progress Notes (Signed)
Correction. Area is on left inner thigh.

## 2012-08-23 NOTE — Interval H&P Note (Signed)
History and Physical Interval Note:  08/23/2012 7:09 AM  [name]  has presented today for surgery, with the diagnosis of diverticular disease  The various methods of treatment have been discussed with the patient and family. After consideration of risks, benefits and other options for treatment, the patient has consented to  Procedure(s): COMPLETION COLECTOMY/OSTOMY CLOSURE   (N/A) OSTOMY closure (N/A) as a surgical intervention .  The patient's history has been reviewed, patient examined, no change in status, stable for surgery.  I have reviewed the patient's chart and labs.  Questions were answered to the patient's satisfaction.     [name]

## 2012-08-23 NOTE — Brief Op Note (Signed)
08/23/2012  2:44 PM  PATIENT:  [name]  42 y.o. male  PRE-OPERATIVE DIAGNOSIS:  diverticular disease  POST-OPERATIVE DIAGNOSIS:  diverticular disease  PROCEDURE:  Procedure(s): PARTIAL COLECTOMY (N/A) OSTOMY closure (N/A) DEBRIDEMENT WOUND LEFT THIGH (Left) APPENDECTOMY (N/A)  SURGEON:  Surgeon(s) and Role:    * Arya Boxley A. Rosealee Recinos, MD - Primary    * Adolph Pollack, MD - Assisting  PHYSICIAN ASSISTANT:   ASSISTANTS: Dr Abbey Chatters   ANESTHESIA:   general  EBL:  Total I/O In: 4050 [I.V.:4000; IV Piggyback:50] Out: 1100 [Urine:300; Other:400; Blood:400]  BLOOD ADMINISTERED:none  DRAINS: none   LOCAL MEDICATIONS USED:  NONE  SPECIMEN:  No Specimen and Source of Specimen:  sigmoid colon,  ostomy and jejunum appendix  DISPOSITION OF SPECIMEN:  PATHOLOGY  COUNTS:  YES  TOURNIQUET:  * No tourniquets in log *  DICTATION: .Other Dictation: Dictation Number [id number]    PLAN OF CARE: Admit to inpatient   PATIENT DISPOSITION:  PACU - hemodynamically stable.   Delay start of Pharmacological VTE agent (>24hrs) due to surgical blood loss or risk of bleeding: no

## 2012-08-23 NOTE — Progress Notes (Signed)
Patient has skin breakdown on inner thighs draining  Blood tinged liquid from open area on right inner thigh 4x4 to area . Ostomy appliance intact and wound drainage device intact and draining.

## 2012-08-23 NOTE — H&P (View-Only) (Signed)
Patient ID: [name], male   DOB: 12/29/1970, 42 y.o.   MRN: [id number]  No chief complaint on file.   HPI [name] is a 41 y.o. male.  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 He feels well no complaints. He is ready to schedule colostomy closure. He is gaining weight and his colostomy is doing well. HPI  Past Medical History  Diagnosis Date  . Urinary anastomotic stricture undiagnosed     hard to be cathed; urology did the last time  . Diverticulitis   . Colon obstruction 01/17/2012  . Obesity, Class III, BMI 40-49.9 (morbid obesity) 01/17/2012    Past Surgical History  Procedure Laterality Date  . Partial colectomy  01/17/2012    Procedure: PARTIAL COLECTOMY;  Surgeon: Braeden Kennan A. Bonham Zingale, MD;  Location: [facility];  Service: General;  Laterality: N/A;  . Colostomy  01/17/2012    Procedure: COLOSTOMY;  Surgeon: Shenika Quint A. Rosamond Andress, MD;  Location: [facility];  Service: General;  Laterality: N/A;  . Stoma revision  05/16/2012    Procedure: STOMA REVISION;  Surgeon: Nylen Creque A. Sheralee Qazi, MD;  Location: [facility];  Service: General;  Laterality: N/A;  Dilation of ostomy    History reviewed. No pertinent family history.  Social History History  Substance Use Topics  . Smoking status: Former Smoker -- 2.00 packs/day for 20 years    Types: Cigarettes    Start date: 01/16/2012  . Smokeless tobacco: Never Used  . Alcohol Use: No    Allergies  Allergen Reactions  . Ivp Dye (Iodinated Diagnostic Agents) Other (See Comments)    Kidney failure    Current Outpatient Prescriptions  Medication Sig Dispense Refill  . HYDROcodone-acetaminophen (NORCO) 10-325 MG per tablet Take 1 tablet by mouth every 6 (six) hours as needed for pain.  50 tablet  0  . HYDROcodone-acetaminophen (NORCO) 10-325 MG per tablet Take 1 tablet by mouth every 6 (six) hours as needed for pain.  50 tablet  0  . polyethylene glycol powder (MIRALAX) powder Take 17 g by mouth 2 (two) times  daily.      . promethazine (PHENERGAN) 12.5 MG tablet Take 1 tablet (12.5 mg total) by mouth every 6 (six) hours as needed. For nausea.  30 tablet  0   No current facility-administered medications for this visit.    Review of Systems Review of Systems  Constitutional: Negative.   HENT: Negative.   Respiratory: Negative.   Cardiovascular: Negative.     Blood pressure 110/74, pulse 88, temperature 98.5 F (36.9 C), resp. rate 18, height 5' 10" (1.778 m), weight 274 lb (124.286 kg).  Physical Exam Physical Exam  Constitutional: He is oriented to person, place, and time. He appears well-developed.  Eyes: EOM are normal. Pupils are equal, round, and reactive to light.  Abdominal:    Musculoskeletal: Normal range of motion.  Neurological: He is alert and oriented to person, place, and time.  Skin: Skin is warm.  Psychiatric: He has a normal mood and affect. His behavior is normal. Thought content normal.    Data Reviewed Clinical Data: Preop for colostomy closure  BARIUM ENEMA THROUGH COLOSTOMY  Contrast: Single column barium  Comparison: CT abdomen pelvis of 01/23/2012 and fistula injection  of 06/13/2012  Findings: A preliminary film of the abdomen shows a nonspecific  bowel gas pattern. A pigtail drainage catheter overlies the left  upper quadrant.  Barium entered the rectum with no obstruction. The patient did  have some   discomfort upon distending the rectum, and the best study  possible was performed. The descending colon extends to a point  just below the pigtail catheter. However there is a linear  fistulous communication with the mid descending colon and the  abscess cavity drained by catheter.  Barium was then administered via the ostomy. The proximal colon is  moderately well visualized with no gross abnormality. Obviously  there is retained feces which makes exclusion of small polypoid  lesions difficult. On the postevacuation film no significant  abnormality is  seen.  IMPRESSION:  1. There is a fistulous communication with the mid descending  colon and the cavity drained by the pig tail catheter in the left  abdomen.  2. The more proximal colon is unremarkable although there is some  feces present.  Original Report Authenticated By: Paul Barry, M.D.    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    Plan    He is ready for closure of his colostomy at this point in time. He is asked to gain weight and I feel that his catabolic time has resolved and his wounds have healed adequately. I feel that completion colectomy given the fact that he has a fistula proximal to the colostomy and other diverticular changes make A. Cole oh rectal anastomosis difficult. I discussed removing the remainder of his colon except for what is distal and this appears healthy by barium enema. He may have more bowel movements but he is okay with that I think this would reduce his risk of other complications.The procedure was discussed with the patient.  Completion abdominal colectomy and closure of colostomy discussed. The risks of operative management include bleeding,  Infection,  Leak of anastamosis,  Ostomy formation, open procedure,  Sepsis,  Abcess,  Hernia,  DVT,  Pulmonary complications,  Cardiovascular  complications,  Injury to ureter,  Bladder,kidney,and anesthesia risks,  And death. The patient understands.  Questions answered.   The success of the procedure is 50-100 % for treating the patients symptoms. They agree to proceed.       Fizza Scales A. 07/27/2012, 11:21 AM    

## 2012-08-23 NOTE — Transfer of Care (Signed)
Immediate Anesthesia Transfer of Care Note  Patient: [name]  Procedure(s) Performed: Procedure(s): PARTIAL COLECTOMY, ostomy closure, flexible and rigid sigmoidectomy, placement of biological mesh, application of wound vac debridement of left thigh wound, and appendectomy (N/A) OSTOMY closure (N/A) DEBRIDEMENT WOUND LEFT THIGH (Left) APPENDECTOMY (N/A) APPLICATION OF WOUND VAC  Patient Location: PACU  Anesthesia Type:General  Level of Consciousness: awake, oriented, patient cooperative, lethargic and responds to stimulation  Airway & Oxygen Therapy: Patient Spontanous Breathing and Patient connected to face mask oxygen  Post-op Assessment: Report given to PACU RN, Post -op Vital signs reviewed and stable and Patient moving all extremities  Post vital signs: Reviewed and stable  Complications: No apparent anesthesia complications

## 2012-08-23 NOTE — Anesthesia Preprocedure Evaluation (Signed)
Anesthesia Evaluation  Patient identified by MRN, date of birth, ID band Patient awake    Reviewed: Allergy & Precautions, H&P , NPO status , Patient's Chart, lab work & pertinent test results  Airway Mallampati: II TM Distance: >3 FB Neck ROM: full    Dental no notable dental hx. (+) Edentulous Upper and Edentulous Lower   Pulmonary neg pulmonary ROS, Current Smoker,  breath sounds clear to auscultation  Pulmonary exam normal       Cardiovascular negative cardio ROS  Rhythm:regular Rate:Normal     Neuro/Psych negative neurological ROS  negative psych ROS   GI/Hepatic negative GI ROS, Neg liver ROS,   Endo/Other  negative endocrine ROS  Renal/GU negative Renal ROS  negative genitourinary   Musculoskeletal negative musculoskeletal ROS (+)   Abdominal (+) + obese,   Peds negative pediatric ROS (+)  Hematology negative hematology ROS (+)   Anesthesia Other Findings   Reproductive/Obstetrics negative OB ROS                           Anesthesia Physical  Anesthesia Plan  ASA: III  Anesthesia Plan: General   Post-op Pain Management:    Induction: Intravenous  Airway Management Planned: Oral ETT  Additional Equipment:   Intra-op Plan:   Post-operative Plan: Extubation in OR  Informed Consent: I have reviewed the patients History and Physical, chart, labs and discussed the procedure including the risks, benefits and alternatives for the proposed anesthesia with the patient or authorized representative who has indicated his/her understanding and acceptance.   Dental Advisory Given and Dental advisory given  Plan Discussed with: CRNA and Surgeon  Anesthesia Plan Comments:         Anesthesia Quick Evaluation

## 2012-08-23 NOTE — Anesthesia Procedure Notes (Signed)
Date/Time: 08/23/2012 7:57 AM Performed by: Edison Pace Pre-anesthesia Checklist: Patient identified, Timeout performed, Emergency Drugs available, Suction available and Patient being monitored Patient Re-evaluated:Patient Re-evaluated prior to inductionOxygen Delivery Method: Circle system utilized Preoxygenation: Pre-oxygenation with 100% oxygen

## 2012-08-24 ENCOUNTER — Encounter (HOSPITAL_COMMUNITY): Payer: Self-pay | Admitting: Surgery

## 2012-08-24 LAB — CBC
Hemoglobin: 14.4 g/dL (ref 13.0–17.0)
MCH: 30.3 pg (ref 26.0–34.0)
MCHC: 32.9 g/dL (ref 30.0–36.0)
MCV: 92 fL (ref 78.0–100.0)
Platelets: 431 10*3/uL — ABNORMAL HIGH (ref 150–400)
RBC: 4.76 MIL/uL (ref 4.22–5.81)

## 2012-08-24 LAB — BASIC METABOLIC PANEL
BUN: 7 mg/dL (ref 6–23)
CO2: 28 mEq/L (ref 19–32)
Calcium: 8.6 mg/dL (ref 8.4–10.5)
GFR calc non Af Amer: >90 mL/min (ref >90)
Glucose, Bld: 139 mg/dL — ABNORMAL HIGH (ref 70–99)
Sodium: 135 mEq/L (ref 135–145)

## 2012-08-24 MED ORDER — PANTOPRAZOLE SODIUM 40 MG IV SOLR
40.0000 mg | Freq: Every day | INTRAVENOUS | Status: DC
  Administered 2012-08-24 – 2012-09-03 (×11): 40 mg via INTRAVENOUS
  Filled 2012-08-24 (×12): qty 40

## 2012-08-24 MED ORDER — SODIUM CHLORIDE 0.9 % IJ SOLN
10.0000 mL | INTRAMUSCULAR | Status: DC | PRN
  Administered 2012-08-26 – 2012-09-03 (×6): 10 mL

## 2012-08-24 NOTE — Care Management Note (Addendum)
    Page 1 of 2   09/05/2012     1:33:44 PM   CARE MANAGEMENT NOTE 09/05/2012  Patient:  [name], [name]   Account Number:  [id number]  Date Initiated:  08/24/2012  Documentation initiated by:  Lorenda Ishihara  Subjective/Objective Assessment:   42 yo male admitted s/p colectomy with ostomy closure. PTA lived at home with family     Action/Plan:   Home when stable   Anticipated DC Date:  08/27/2012   Anticipated DC Plan:  HOME W HOME HEALTH SERVICES      DC Planning Services  CM consult      [facility] Choice  HOME HEALTH  DURABLE MEDICAL EQUIPMENT   Choice offered to / List presented to:  C-1 Patient   DME arranged  [facility]      DME agency  [facility]     [facility] arranged  HH-1 RN      [facility] agency  [facility]   Status of service:  Completed, signed off Medicare Important Message given?   (If response is "NO", the following Medicare IM given date fields will be blank) Date Medicare IM given:   Date Additional Medicare IM given:    Discharge Disposition:  HOME W HOME HEALTH SERVICES  Per UR Regulation:  Reviewed for med. necessity/level of care/duration of stay  If discussed at Long Length of Stay Meetings, dates discussed:    Comments:  08-24-12 Lorenda Ishihara RN CM Patient has used [facility] in the past for [facility] services.

## 2012-08-24 NOTE — Progress Notes (Signed)
1 Day Post-Op  Subjective: SORE BACK   Objective: Vital signs in last 24 hours: Temp:  [98.3 F (36.8 C)-99.5 F (37.5 C)] 98.4 F (36.9 C) (05/09 0621) Pulse Rate:  [96-115] 96 (05/09 0621) Resp:  [16-24] 20 (05/09 0800) BP: (119-174)/(82-110) 119/88 mmHg (05/09 0621) SpO2:  [95 %-100 %] 95 % (05/09 0800) FiO2 (%):  [30 %-42 %] 30 % (05/09 0401) Weight:  [274 lb (124.286 kg)] 274 lb (124.286 kg) (05/08 1800) Last BM Date: 08/23/12  Intake/Output from previous day: 05/08 0701 - 05/09 0700 In: 6439.6 [I.V.:6389.6; IV Piggyback:50] Out: 2350 [Urine:1500; Drains:50; Blood:400] Intake/Output this shift:    Incision/Wound:VAC IN PLACE TIMES 2 SOFT NON DISTENDED LEFT THIGH WOUND WITH SEROUS DRAINAGE  Lab Results:   Recent Labs  08/23/12 0538 08/24/12 0406  WBC 13.2* 15.3*  HGB 16.1 14.4  HCT 46.2 43.8  PLT 432* 431*   BMET  Recent Labs  08/23/12 0538 08/24/12 0406  NA 135 135  K 3.5 4.4  CL 99 100  CO2 23 28  GLUCOSE 120* 139*  BUN 9 7  CREATININE 0.74 0.75  CALCIUM 9.9 8.6   PT/INR No results found for this basename: LABPROT, INR,  in the last 72 hours ABG No results found for this basename: PHART, PCO2, PO2, HCO3,  in the last 72 hours  Studies/Results: No results found.  Anti-infectives: Anti-infectives   Start     Dose/Rate Route Frequency Ordered Stop   08/23/12 2000  cefOXitin (MEFOXIN) 1 g in dextrose 5 % 50 mL IVPB     1 g 100 mL/hr over 30 Minutes Intravenous Every 6 hours 08/23/12 1759 08/24/12 1359   08/23/12 0513  ertapenem (INVANZ) 1 g in sodium chloride 0.9 % 50 mL IVPB     1 g 100 mL/hr over 30 Minutes Intravenous On call to O.R. 08/23/12 4098 08/23/12 0730   08/23/12 0512  cefoTEtan (CEFOTAN) 2 g in dextrose 5 % 50 mL IVPB     2 g 100 mL/hr over 30 Minutes Intravenous On call to O.R. 08/23/12 1191 08/23/12 0745      Assessment/Plan: s/p Procedure(s): PARTIAL COLECTOMY, ostomy closure, flexible and rigid sigmoidectomy, placement  of biological mesh, application of wound vac debridement of left thigh wound, and appendectomy (N/A) OSTOMY closure (N/A) DEBRIDEMENT WOUND LEFT THIGH (Left) APPENDECTOMY (N/A) APPLICATION OF WOUND VAC PICC line due to poor venous access. OOB NPO  VAC CHANGE SUNDAY  LOS: 1 day    Jeffrey Brinton A. 08/24/2012

## 2012-08-24 NOTE — Progress Notes (Signed)
Peripherally Inserted Central Catheter/Midline Placement  The IV Nurse has discussed with the patient and/or persons authorized to consent for the patient, the purpose of this procedure and the potential benefits and risks involved with this procedure.  The benefits include less needle sticks, lab draws from the catheter and patient may be discharged home with the catheter.  Risks include, but not limited to, infection, bleeding, blood clot (thrombus formation), and puncture of an artery; nerve damage and irregular heat beat.  Alternatives to this procedure were also discussed.  PICC/Midline Placement Documentation        Lisabeth Devoid 08/24/2012, 2:10 PM

## 2012-08-24 NOTE — Op Note (Signed)
NAME:  TARRELL, DEBES NO.:  [id number]  MEDICAL RECORD NO.:  [id number]  LOCATION:  1523                         FACILITY:  [facility]  PHYSICIAN:  Maisie Fus A. Jessikah Dicker, M.D.DATE OF BIRTH:  June 11, 1970  DATE OF PROCEDURE:  08/23/2012 DATE OF DISCHARGE:                              OPERATIVE REPORT   PREOPERATIVE DIAGNOSIS: 1. History of sigmoid diverticular stricture, status post sigmoid     colostomy. 2. 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  POSTOPERATIVE DIAGNOSIS: 1. History of sigmoid diverticular stricture, status post sigmoid     colostomy. 2. 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  PROCEDURE: 1. Takedown of descending colostomy with anastomosis. 2. Resection of enterocutaneous fistula. 3. Flexible sigmoidoscopy. 4. Rigid sigmoidoscopy. 5. Closure of incisional hernia with biologic mesh of Strattice. 6. Placement of wound VAC. 7. Debridement of left inner thigh wound. 8. Appendectomy.  SURGEON:  Maisie Fus A. Jonavon Trieu, M.D.  ASSISTANT:  Adolph Pollack, M.D.  ESTIMATED BLOOD LOSS:  400 mL.  IV FLUIDS:  4 L of crystalloid.  URINE OUTPUT:  300 mL.  DRAINS:  None.  SPECIMEN: 1. Distal sigmoid colon. 2. Colostomy. 3. Proximal jejunum. 4. Appendix.  INDICATIONS FOR PROCEDURE:  The patient is a 42 year old male, who in October 2013 presented to [facility] with a large bowel obstruction secondary to a stricture.  He underwent a semiemergent exploratory laparotomy with resection of a proximal sigmoid colon stricture that turned out to be diverticulitis.  He had extremely complex postoperative recovery including renal failure, pulmonary failure, fascial dehiscence, and stricture of his colostomy.  He developed a left flank abscess and this was perc drained.  The drainage turned into what looked like succus.  Preoperative evaluation including CT scanning fistulogram and contrast  through his colostomy supported this being a colocutaneous fistula despite the periods of succus from this drain.  He took about 6 months to fully recover from the initial illness.  We had a long talk about taking as colostomy.  I talked about possibly I have to remove the rest of the intra-abdominal colon depending on what I found.  I also talked about the change in bowel function depending what we found.  We also talked about possibility of hooking his colon backup, but this would depend what we found intraoperatively.  I got a barium enema preoperatively and showed mild diverticular disease of the distal sigmoid colon, but this was patent. The remainder of the colon was normal by barium enema.  We had a long talk about risks since I felt he was at high operative risk of bleeding, infection, anastomotic breakdown, having __________, organ injury, more fistulas, and his poor abdominal wall makeup, hernia repair, other operation down the road, death, DVT, exacerbation of underlying medical problems, renal failure, pulmonary failure, and potential for other cardiovascular events.  I talked with he and his family at great length and outlined this risks, and I felt that he at least 30 or 40% chance of having some sort of complication and mortality of about 3%.  After lengthy discussion of all the options and possibilities they wished to proceed.  DESCRIPTION OF PROCEDURE:  The patient was met in  the holding area and questions were answered.  When he presented to the holding area though he had a small what looked like furuncle in the inner aspect of his left inner thigh.  This had drained this morning, but was not very red, and I felt that in the operating room, I could gently manipulate just to make sure what was clear, but I did not think there was any reason to cancel the surgery since he now had a stenotic colostomy and having problems getting it to work.  I explained all this to him.   He was in agreement to proceed.  This area was marked.  We took him back to the operating room and placed him supine on the OR table.  Once we did this, general anesthesia was initiated, a nasogastric tube was placed.  Copious amounts of material were found in the stomach.  We put him in lithotomy. Once I did this, I examined this whole area on the inner aspect of his left inner thigh.  This was already opened.  I placed a hemostat and gently manipulated it.  There was no pulse or anything else that would come out of this.  This was adequately drained, and therefore, I felt needed no other care at this point in time.  We then closed the colostomy with a pursestring suture of 3-0 Vicryl.  The abdomen and perineum were prepped and draped after Foley catheter was placed under sterile conditions.  He received preoperative antibiotics.  After sterile prep and drape of the abdomen and perineum, time-out was done. A midline incision was used and excised his old scar.  There was a large ventral hernia associated with this, we easily entered the abdominal cavity.  We took about 3 hours to take down adhesions.  This took quite some time.  His fascia was very poor quality and retracted.  Once I took all the adhesions down, we then went ahead and excised out his colostomy using cautery.  This was then dropped back into the abdominal cavity. He had dense intra-abdominal adhesions along his left pericolic gutter where he had an abscess and had to carefully dissect the bowel off and this was very difficult.  This alone took about 90 minutes just to mobilize this side of the abdominal cavity.  He did have what appeared to be an enterocutaneous fistula.  His perc drain was left in place. The bowel was densely adherent to the drain and it looked like the abscess cavity had been drained by this, but over time the catheter must have been eroded into the small bowel creating this fistula even though his  preoperative evaluations support colocutaneous fistula.  He indeed had an enterocutaneous fistula.  This area of small bowels was closed with sutures.  I then mobilized the small bowel through the ligament Treitz all the way down to the cecum.  Appendectomy was performed.  The LigaSure was used to take down the mesoappendix and a GIA-75 stapling device was used to remove the appendix.  The stump was hemostatic. After this was done, I identified the distal sigmoid colon.  Part of it was __________ disease looking down to the junction of the rectum.  I felt this needed to be resected.  We went ahead and resected the distal sigmoid using a GIA-75 stapling device to divide it distally and the mesentery was taken down with the LigaSure.  This was passed off the field.  The end of the colostomy was then freshened up  and then I took a GIA-75 stapling device to divide this approximately 4 cm proximal until the end of the colostomy passed off the field.  The small bowel fistula site was identified and the small bowel suction was done with a combination of GIA-75 stapling device.  We then created a side-to-side anastomosis using the GIA-75 stapling device and TA60.  The abdomen was widely patent.  Mesenteric defect was closed.  We then began to mobilize the transverse colon and we were actually were mobilized the hepatic flexure and the ascending colon.  I debated about doing a total colectomy, but felt he had excellent amount of colon that reached very easily into his pelvis.  It was healthy looking and not diseased nor did he have any obvious diverticula.  Given the healthy nature of the colon and the ease with which we reached down into his pelvis, I felt his quality of life would be much better with a colo-colo anastomoses.  We decided to use the EEA stapling device and do a Bakers anastomosis.  The proximal rectal stump was then opened using scissors and taking the staples out with 2-0 Prolene,  pursestring suture was then placed.  A 29 EEA anvil was placed and this was cinched down.  The staple line was removed from the proximal colon and the stapling device was placed through this with the point going out of the antimesenteric border of the transverse colon.  We then connected the 2 ends of 5 stapling device without difficulty.  I had 2 complete donuts.  We then proceeded to do rigid sigmoidoscopy.  He had significant amount of inspissated barium in his rectum and I had to irrigate his rectum out using a laparoscopic irrigator and copious amounts of irrigation.  We inflated air into the rectum, but could not get the ureter passed through the anastomosis indicating some sort of obstruction.  I was able then to get a flexible sigmoidoscope and advanced this.  He had a large bowel barium that was causing a complete obstruction at about 20 cm.  It took about an hour of irrigating and manipulating this area, but we were able to finally break open the barium and flushed this out so it would not be obstructing distally to his anastomosis.  I was then able to pass the flexible sigmoidoscope all way to the anastomosis and visualized directly.  There was no evidence of any bleeding.  We then insufflated this and descended this area out and put this under saline and there were no bubbles at all or leaking from it.  The air was the evacuated.  The scope was withdrawn with no evidence of injury to the mucosa of bowel.  This was passed off the field.  At this point in time, we irrigated out the abdominal cavity with copious amounts of saline until clear.  We did not close the mesenteric defect in this situation.  This was quite large.  Hemostasis was excellent.  His perc drain was then removed and passed off the field.  We realized that his midline fascia was of poor quality.  We mobilized the skin off the fascia to get as much length as we could to close the skin.  We decided we close the  bottom and top of the incisions with interrupted Novafil #1 double-stranded PDS.  We got to just __________ of the umbilicus and for approximately 10 cm the fascia was of poor quality and would not approximate.  I used a 10 cm  piece of Strattice mesh and biologic mesh and sewed this in as a bridge.  I realized that he will develop a hernia here, but he did not have enough abdominal wall closed at this point in time.  I did not want to use regular mesh in this situation either.  We had sewed this in and tied our sutures to the mesh for fascial closure.  We irrigated out the wound.  Prior to this, I closed the ostomy with __________ #1 Novafil pop-off suture from the inside and outside.  Wound VAC was placed over both and wide connector was used to widen together. With this __________ down a good seal.  At this point in time all final counts of sponge, needle, and instruments were found to be correct.  The patient was then awoke, extubated, taken to recovery in stable condition.     Geno Sydnor A. Hermione Havlicek, M.D.     TAC/MEDQ  D:  08/23/2012  T:  08/24/2012  Job:  [id number]

## 2012-08-24 NOTE — Anesthesia Postprocedure Evaluation (Signed)
  Anesthesia Post-op Note  Patient: [name]  Procedure(s) Performed: Procedure(s) (LRB): PARTIAL COLECTOMY, ostomy closure, flexible and rigid sigmoidectomy, placement of biological mesh, application of wound vac debridement of left thigh wound, and appendectomy (N/A) OSTOMY closure (N/A) DEBRIDEMENT WOUND LEFT THIGH (Left) APPENDECTOMY (N/A) APPLICATION OF WOUND VAC  Patient Location: PACU  Anesthesia Type: General  Level of Consciousness: awake and alert   Airway and Oxygen Therapy: Patient Spontanous Breathing  Post-op Pain: mild  Post-op Assessment: Post-op Vital signs reviewed, Patient's Cardiovascular Status Stable, Respiratory Function Stable, Patent Airway and No signs of Nausea or vomiting  Last Vitals:  Filed Vitals:   08/24/12 1006  BP:   Pulse:   Temp:   Resp: 17    Post-op Vital Signs: stable   Complications: No apparent anesthesia complications

## 2012-08-25 ENCOUNTER — Encounter (HOSPITAL_COMMUNITY): Payer: Self-pay | Admitting: Surgery

## 2012-08-25 DIAGNOSIS — K632 Fistula of intestine: Secondary | ICD-10-CM | POA: Diagnosis present

## 2012-08-25 DIAGNOSIS — K5792 Diverticulitis of intestine, part unspecified, without perforation or abscess without bleeding: Secondary | ICD-10-CM

## 2012-08-25 LAB — COMPREHENSIVE METABOLIC PANEL
AST: 14 U/L (ref 0–37)
Albumin: 2.4 g/dL — ABNORMAL LOW (ref 3.5–5.2)
BUN: 8 mg/dL (ref 6–23)
Creatinine, Ser: 0.59 mg/dL (ref 0.50–1.35)
Total Protein: 5.4 g/dL — ABNORMAL LOW (ref 6.0–8.3)

## 2012-08-25 LAB — CBC
HCT: 34.9 % — ABNORMAL LOW (ref 39.0–52.0)
MCHC: 33 g/dL (ref 30.0–36.0)
MCV: 93.3 fL (ref 78.0–100.0)
Platelets: 350 10*3/uL (ref 150–400)
RDW: 13.9 % (ref 11.5–15.5)
WBC: 14.9 10*3/uL — ABNORMAL HIGH (ref 4.0–10.5)

## 2012-08-25 NOTE — Progress Notes (Signed)
Patient ID: [name], male   DOB: [birth year], 42 y.o.   MRN: [id number] [facility] Surgery Progress Note:   2 Days Post-Op  Subjective: Mental status is clear.   Objective: Vital signs in last 24 hours: Temp:  [98.2 F (36.8 C)-99.4 F (37.4 C)] 99.4 F (37.4 C) (05/10 0605) Pulse Rate:  [98-111] 98 (05/10 0605) Resp:  [17-26] 26 (05/10 0814) BP: (119-127)/(72-90) 124/77 mmHg (05/10 0605) SpO2:  [2 %-97 %] 94 % (05/10 0814) FiO2 (%):  [38 %-40 %] 40 % (05/10 0328)  Intake/Output from previous day: 05/09 0701 - 05/10 0700 In: 3141.7 [I.V.:3091.7; IV Piggyback:50] Out: 2330 [Urine:680; Emesis/NG output:1000; Drains:650] Intake/Output this shift:    Physical Exam: Work of breathing is normal.  Ostomy site VAC not draining and appears occluded.  Removed and changed to wet-dry.  Midline incision VAC in place.    Lab Results:  Results for orders placed during the hospital encounter of 08/23/12 (from the past 48 hour(s))  BASIC METABOLIC PANEL     Status: Abnormal   Collection Time    08/24/12  4:06 AM      Result Value Range   Sodium 135  135 - 145 mEq/L   Potassium 4.4  3.5 - 5.1 mEq/L   Comment: DELTA CHECK NOTED     NO VISIBLE HEMOLYSIS     REPEATED TO VERIFY   Chloride 100  96 - 112 mEq/L   CO2 28  19 - 32 mEq/L   Glucose, Bld 139 (*) 70 - 99 mg/dL   BUN 7  6 - 23 mg/dL   Creatinine, Ser 8.41  0.50 - 1.35 mg/dL   Calcium 8.6  8.4 - 32.4 mg/dL   GFR calc non Af Amer >90  >90 mL/min   GFR calc Af Amer >90  >90 mL/min   Comment:            The eGFR has been calculated     using the CKD EPI equation.     This calculation has not been     validated in all clinical     situations.     eGFR's persistently     <90 mL/min signify     possible Chronic Kidney Disease.  CBC     Status: Abnormal   Collection Time    08/24/12  4:06 AM      Result Value Range   WBC 15.3 (*) 4.0 - 10.5 K/uL   RBC 4.76  4.22 - 5.81 MIL/uL   Hemoglobin 14.4  13.0 - 17.0 g/dL   HCT  40.1  02.7 - 25.3 %   MCV 92.0  78.0 - 100.0 fL   MCH 30.3  26.0 - 34.0 pg   MCHC 32.9  30.0 - 36.0 g/dL   RDW 66.4  40.3 - 47.4 %   Platelets 431 (*) 150 - 400 K/uL  CBC     Status: Abnormal   Collection Time    08/25/12  4:00 AM      Result Value Range   WBC 14.9 (*) 4.0 - 10.5 K/uL   RBC 3.74 (*) 4.22 - 5.81 MIL/uL   Hemoglobin 11.5 (*) 13.0 - 17.0 g/dL   Comment: REPEATED TO VERIFY     DELTA CHECK NOTED   HCT 34.9 (*) 39.0 - 52.0 %   MCV 93.3  78.0 - 100.0 fL   MCH 30.7  26.0 - 34.0 pg   MCHC 33.0  30.0 - 36.0 g/dL   RDW  13.9  11.5 - 15.5 %   Platelets 350  150 - 400 K/uL  COMPREHENSIVE METABOLIC PANEL     Status: Abnormal   Collection Time    08/25/12  4:00 AM      Result Value Range   Sodium 138  135 - 145 mEq/L   Potassium 3.7  3.5 - 5.1 mEq/L   Chloride 103  96 - 112 mEq/L   CO2 27  19 - 32 mEq/L   Glucose, Bld 95  70 - 99 mg/dL   BUN 8  6 - 23 mg/dL   Creatinine, Ser 5.40  0.50 - 1.35 mg/dL   Calcium 8.2 (*) 8.4 - 10.5 mg/dL   Total Protein 5.4 (*) 6.0 - 8.3 g/dL   Albumin 2.4 (*) 3.5 - 5.2 g/dL   AST 14  0 - 37 U/L   ALT 20  0 - 53 U/L   Alkaline Phosphatase 60  39 - 117 U/L   Total Bilirubin 0.3  0.3 - 1.2 mg/dL   GFR calc non Af Amer >90  >90 mL/min   GFR calc Af Amer >90  >90 mL/min   Comment:            The eGFR has been calculated     using the CKD EPI equation.     This calculation has not been     validated in all clinical     situations.     eGFR's persistently     <90 mL/min signify     possible Chronic Kidney Disease.    Radiology/Results: No results found.  Anti-infectives: Anti-infectives   Start     Dose/Rate Route Frequency Ordered Stop   08/23/12 2000  cefOXitin (MEFOXIN) 1 g in dextrose 5 % 50 mL IVPB     1 g 100 mL/hr over 30 Minutes Intravenous Every 6 hours 08/23/12 1759 08/24/12 0846   08/23/12 0513  ertapenem (INVANZ) 1 g in sodium chloride 0.9 % 50 mL IVPB     1 g 100 mL/hr over 30 Minutes Intravenous On call to O.R.  08/23/12 9811 08/23/12 0730   08/23/12 0512  cefoTEtan (CEFOTAN) 2 g in dextrose 5 % 50 mL IVPB     2 g 100 mL/hr over 30 Minutes Intravenous On call to O.R. 08/23/12 0512 08/23/12 0745      Assessment/Plan: Problem List: Patient Active Problem List   Diagnosis Date Noted  . Post-op pain 07/13/2012  . Post-operative state 06/28/2012  . 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 Hypertriglyceridemia 01/22/2012  . Tobacco abuse 01/21/2012  . Intra-abdominal abscess, diverticular s/p surgical drainage 01/21/2012  . Acute respiratory failure with hypoxia 01/18/2012  . Acute pulmonary edema 01/18/2012  . Encephalopathy acute 01/18/2012  . Colon obstruction 01/17/2012  . Obesity, Class III, BMI 40-49.9 (morbid obesity) 01/17/2012  . UTI (lower urinary tract infection) 12/24/2011  . Constipation 12/24/2011  . Diverticulitis of colon without hemorrhage 12/24/2011  . Abdominal pain 12/24/2011    Incision management and pain are the two main problems.  Ileus persists but not unexpected.  Encouraged to get up and walk.   2 Days Post-Op    LOS: 2 days   Matt B. Daphine Deutscher, MD, [facility] [phone] beeper [phone]  08/25/2012 9:04 AM

## 2012-08-26 NOTE — Progress Notes (Signed)
Patient ID: [name], male   DOB: 01/20/1971, 42 y.o.   MRN: [id number] [facility] Progress Note:   3 Days Post-Op  Subjective: Mental status is fairly clear Objective: Vital signs in last 24 hours: Temp:  [98.1 F (36.7 C)-99.6 F (37.6 C)] 99.6 F (37.6 C) (05/11 0440) Pulse Rate:  [92-98] 93 (05/11 0440) Resp:  [16-23] 18 (05/11 0806) BP: (114-120)/(65-70) 120/70 mmHg (05/11 0440) SpO2:  [90 %-99 %] 98 % (05/11 0806)  Intake/Output from previous day: 05/10 0701 - 05/11 0700 In: 3094.6 [P.O.:30; I.V.:3064.6] Out: 2270 [Urine:1370; Emesis/NG output:900] Intake/Output this shift:    Physical Exam: Work of breathing is not labored.  Have been working with O2 sat alarm.  Abdomen ostomy site with wet-dry.  VAC sponge to be changed today.  No flatus yet  Lab Results:  Results for orders placed during the hospital encounter of 08/23/12 (from the past 48 hour(s))  CBC     Status: Abnormal   Collection Time    08/25/12  4:00 AM      Result Value Range   WBC 14.9 (*) 4.0 - 10.5 K/uL   RBC 3.74 (*) 4.22 - 5.81 MIL/uL   Hemoglobin 11.5 (*) 13.0 - 17.0 g/dL   Comment: REPEATED TO VERIFY     DELTA CHECK NOTED   HCT 34.9 (*) 39.0 - 52.0 %   MCV 93.3  78.0 - 100.0 fL   MCH 30.7  26.0 - 34.0 pg   MCHC 33.0  30.0 - 36.0 g/dL   RDW 40.9  81.1 - 91.4 %   Platelets 350  150 - 400 K/uL  COMPREHENSIVE METABOLIC PANEL     Status: Abnormal   Collection Time    08/25/12  4:00 AM      Result Value Range   Sodium 138  135 - 145 mEq/L   Potassium 3.7  3.5 - 5.1 mEq/L   Chloride 103  96 - 112 mEq/L   CO2 27  19 - 32 mEq/L   Glucose, Bld 95  70 - 99 mg/dL   BUN 8  6 - 23 mg/dL   Creatinine, Ser 7.82  0.50 - 1.35 mg/dL   Calcium 8.2 (*) 8.4 - 10.5 mg/dL   Total Protein 5.4 (*) 6.0 - 8.3 g/dL   Albumin 2.4 (*) 3.5 - 5.2 g/dL   AST 14  0 - 37 U/L   ALT 20  0 - 53 U/L   Alkaline Phosphatase 60  39 - 117 U/L   Total Bilirubin 0.3  0.3 - 1.2 mg/dL   GFR calc non Af Amer  >90  >90 mL/min   GFR calc Af Amer >90  >90 mL/min   Comment:            The eGFR has been calculated     using the CKD EPI equation.     This calculation has not been     validated in all clinical     situations.     eGFR's persistently     <90 mL/min signify     possible Chronic Kidney Disease.    Radiology/Results: No results found.  Anti-infectives: Anti-infectives   Start     Dose/Rate Route Frequency Ordered Stop   08/23/12 2000  cefOXitin (MEFOXIN) 1 g in dextrose 5 % 50 mL IVPB     1 g 100 mL/hr over 30 Minutes Intravenous Every 6 hours 08/23/12 1759 08/24/12 0846   08/23/12 0513  ertapenem (INVANZ) 1 g  in sodium chloride 0.9 % 50 mL IVPB     1 g 100 mL/hr over 30 Minutes Intravenous On call to O.R. 08/23/12 2130 08/23/12 0730   08/23/12 0512  cefoTEtan (CEFOTAN) 2 g in dextrose 5 % 50 mL IVPB     2 g 100 mL/hr over 30 Minutes Intravenous On call to O.R. 08/23/12 0512 08/23/12 0745      Assessment/Plan: Problem List: Patient Active Problem List   Diagnosis Date Noted  . 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 Diverticulitis 08/25/2012  . Post-op pain 07/13/2012  . Post-operative state 06/28/2012  . Hypertriglyceridemia 01/22/2012  . Tobacco abuse 01/21/2012  . Obesity, Class III, BMI 40-49.9 (morbid obesity) 01/17/2012  . Constipation 12/24/2011  . Stricture of sigmoid colon s/p resection/colostomy 2013 12/24/2011  . Abdominal pain 12/24/2011    Ileus.  Will maintain NG suction.  VAC change today per nursing.  3 Days Post-Op    LOS: 3 days   Matt B. Daphine Deutscher, MD, [facility] [phone] beeper [phone]  08/26/2012 8:36 AM

## 2012-08-27 NOTE — Consult Note (Signed)
WOC consult Note Reason for Consult:NPWT management to midline wound.  NPWT Dressing changed on Monday, 5/12 by staff RN.  Will request RN to measure prior to dressing application on Wednesday Wound type:surgical Pressure Ulcer POA: No Measurement:Not known Wound bed:No seen. Drainage (amount, consistency, odor)  Periwound: Unable to observe/assess. Dressing procedure/placement/frequency:NPWT dressing schedule change to M-W-F as it was performed today.  Intact at this time. RN will measure wound on Wednesday prior to dressing re-application. I will not follow but will remain available to this patient and his surgical and nursing staff as needed..  Please re-consult if needed. Thanks, Ladona Mow, MSN, RN, [facility], CWOCN [phone])

## 2012-08-27 NOTE — Progress Notes (Signed)
4 Days Post-Op  Subjective: No flatus  Objective: Vital signs in last 24 hours: Temp:  [98.6 F (37 C)-99.2 F (37.3 C)] 98.8 F (37.1 C) (05/12 0430) Pulse Rate:  [84-96] 84 (05/12 0430) Resp:  [14-18] 16 (05/12 0817) BP: (120-131)/(66-78) 131/78 mmHg (05/12 0430) SpO2:  [95 %-99 %] 98 % (05/12 0817) Last BM Date: 08/23/12  Intake/Output from previous day: 05/11 0701 - 05/12 0700 In: 2173.3 [I.V.:1943.3; NG/GT:230] Out: 1500 [Urine:1400; Emesis/NG output:50; Drains:50] Intake/Output this shift:    Incision/Wound:vac in place.  Wet to dry to ostomy  Lab Results:   Recent Labs  08/25/12 0400  WBC 14.9*  HGB 11.5*  HCT 34.9*  PLT 350   BMET  Recent Labs  08/25/12 0400  NA 138  K 3.7  CL 103  CO2 27  GLUCOSE 95  BUN 8  CREATININE 0.59  CALCIUM 8.2*   PT/INR No results found for this basename: LABPROT, INR,  in the last 72 hours ABG No results found for this basename: PHART, PCO2, PO2, HCO3,  in the last 72 hours  Studies/Results: No results found.  Anti-infectives: Anti-infectives   Start     Dose/Rate Route Frequency Ordered Stop   08/23/12 2000  cefOXitin (MEFOXIN) 1 g in dextrose 5 % 50 mL IVPB     1 g 100 mL/hr over 30 Minutes Intravenous Every 6 hours 08/23/12 1759 08/24/12 0846   08/23/12 0513  ertapenem (INVANZ) 1 g in sodium chloride 0.9 % 50 mL IVPB     1 g 100 mL/hr over 30 Minutes Intravenous On call to O.R. 08/23/12 1610 08/23/12 0730   08/23/12 0512  cefoTEtan (CEFOTAN) 2 g in dextrose 5 % 50 mL IVPB     2 g 100 mL/hr over 30 Minutes Intravenous On call to O.R. 08/23/12 9604 08/23/12 0745      Assessment/Plan: s/p Procedure(s): PARTIAL COLECTOMY, ostomy closure, flexible and rigid sigmoidectomy, placement of biological mesh, application of wound vac debridement of left thigh wound, and appendectomy (N/A) OSTOMY closure (N/A) DEBRIDEMENT WOUND LEFT THIGH (Left) APPENDECTOMY (N/A) APPLICATION OF WOUND VAC Ambulate Await return of  bowel function  LOS: 4 days    [name] A. 08/27/2012

## 2012-08-28 ENCOUNTER — Inpatient Hospital Stay (HOSPITAL_COMMUNITY): Payer: Medicaid Other

## 2012-08-28 LAB — COMPREHENSIVE METABOLIC PANEL
AST: 15 U/L (ref 0–37)
BUN: 6 mg/dL (ref 6–23)
CO2: 20 mEq/L (ref 19–32)
Chloride: 103 mEq/L (ref 96–112)
Creatinine, Ser: 0.48 mg/dL — ABNORMAL LOW (ref 0.50–1.35)
GFR calc Af Amer: >90 mL/min (ref >90)
GFR calc non Af Amer: >90 mL/min (ref >90)
Glucose, Bld: 66 mg/dL — ABNORMAL LOW (ref 70–99)
Total Bilirubin: 0.3 mg/dL (ref 0.3–1.2)

## 2012-08-28 LAB — CBC
HCT: 34 % — ABNORMAL LOW (ref 39.0–52.0)
Hemoglobin: 11.4 g/dL — ABNORMAL LOW (ref 13.0–17.0)
MCH: 30.6 pg (ref 26.0–34.0)
MCV: 91.4 fL (ref 78.0–100.0)
RBC: 3.72 MIL/uL — ABNORMAL LOW (ref 4.22–5.81)
WBC: 8.6 10*3/uL (ref 4.0–10.5)

## 2012-08-28 LAB — GLUCOSE, CAPILLARY: Glucose-Capillary: 91 mg/dL (ref 70–99)

## 2012-08-28 MED ORDER — CLINIMIX E/DEXTROSE (5/20) 5 % IV SOLN
INTRAVENOUS | Status: AC
  Administered 2012-08-28: 17:00:00 via INTRAVENOUS
  Filled 2012-08-28: qty 1000

## 2012-08-28 MED ORDER — POTASSIUM CHLORIDE IN NACL 20-0.9 MEQ/L-% IV SOLN
INTRAVENOUS | Status: AC
  Administered 2012-08-29: 01:00:00 via INTRAVENOUS
  Filled 2012-08-28 (×3): qty 1000

## 2012-08-28 MED ORDER — METOCLOPRAMIDE HCL 5 MG/ML IJ SOLN
10.0000 mg | Freq: Four times a day (QID) | INTRAMUSCULAR | Status: DC
  Administered 2012-08-28 – 2012-09-05 (×33): 10 mg via INTRAVENOUS
  Filled 2012-08-28 (×39): qty 2

## 2012-08-28 MED ORDER — INSULIN ASPART 100 UNIT/ML ~~LOC~~ SOLN
0.0000 [IU] | Freq: Four times a day (QID) | SUBCUTANEOUS | Status: DC
  Administered 2012-08-29 (×3): 2 [IU] via SUBCUTANEOUS
  Administered 2012-08-29: 1 [IU] via SUBCUTANEOUS
  Administered 2012-08-30: 2 [IU] via SUBCUTANEOUS
  Administered 2012-08-30 (×2): 1 [IU] via SUBCUTANEOUS
  Administered 2012-08-31: 2 [IU] via SUBCUTANEOUS
  Administered 2012-08-31 – 2012-09-01 (×2): 1 [IU] via SUBCUTANEOUS
  Administered 2012-09-02 (×2): 2 [IU] via SUBCUTANEOUS
  Administered 2012-09-03: 1 [IU] via SUBCUTANEOUS

## 2012-08-28 NOTE — Progress Notes (Signed)
INITIAL NUTRITION ASSESSMENT  DOCUMENTATION CODES Per approved criteria  -Obesity Unspecified   INTERVENTION: - TPN per pharmacy (most beneficial if > 7 days) - Diet advancement per MD - Will continue to monitor  NUTRITION DIAGNOSIS: Altered GI function related to prolonged ileus as evidenced by MD notes.   Goal: TPN to meet >90% of estimated nutritional needs  Monitor:  Weights, labs, diet advancement, TPN, BM, nausea  Reason for Assessment: Consult  42 y.o. male  Admitting Dx: Enterocutaneous fistula  ASSESSMENT: POD# 5 partial colectomy, colostomy closure, left thigh wound debridement, and appendectomy and application of wound VAC. Pt with prolonged ileus.   Met with pt who reports eating well PTA, 3 meals/day with good appetite and stable weight. Pt reports having nausea after surgery. Pt denies any bloating or passing any gas. Pt with NGT in place, brown total output yesterday.   Height: Ht Readings from Last 1 Encounters:  08/23/12 5\' 10"  (1.778 m)    Weight: Wt Readings from Last 1 Encounters:  08/23/12 274 lb (124.286 kg)    Ideal Body Weight: 166 lb  % Ideal Body Weight: 165  Wt Readings from Last 10 Encounters:  08/23/12 274 lb (124.286 kg)  08/23/12 274 lb (124.286 kg)  08/10/12 274 lb (124.286 kg)  07/27/12 274 lb (124.286 kg)  07/13/12 275 lb 3.2 oz (124.83 kg)  06/28/12 267 lb 12.8 oz (121.473 kg)  06/19/12 269 lb (122.018 kg)  05/25/12 268 lb 12.8 oz (121.927 kg)  05/16/12 251 lb 4 oz (113.966 kg)  05/16/12 251 lb 4 oz (113.966 kg)    Usual Body Weight: 270 lb  % Usual Body Weight: 101  BMI:  Body mass index is 39.32 kg/(m^2). Class II obesity  Estimated Nutritional Needs: Kcal: 1900-2250 Protein: 115-135g Fluid: 1.9-2.2L/day  Skin: Abdominal and left thigh incision, negative pressure wound VAC   Diet Order: NPO  EDUCATION NEEDS: -No education needs identified at this time   Intake/Output Summary (Last 24 hours) at  08/28/12 1000 Last data filed at 08/28/12 0759  Gross per 24 hour  Intake 1868.75 ml  Output   2975 ml  Net -1106.25 ml    Last BM: 5/8  Labs:   Recent Labs Lab 08/24/12 0406 08/25/12 0400 08/28/12 0430  NA 135 138 136  K 4.4 3.7 4.0  CL 100 103 103  CO2 28 27 20   BUN 7 8 6   CREATININE 0.75 0.59 0.48*  CALCIUM 8.6 8.2* 8.8  GLUCOSE 139* 95 66*    CBG (last 3)  No results found for this basename: GLUCAP,  in the last 72 hours  Scheduled Meds: . alvimopan  12 mg Oral BID  . antiseptic oral rinse  15 mL Mouth Rinse q12n4p  . chlorhexidine  15 mL Mouth Rinse BID  . enoxaparin (LOVENOX) injection  40 mg Subcutaneous Q24H  . HYDROmorphone PCA 0.3 mg/mL   Intravenous Q4H  . insulin aspart  0-9 Units Subcutaneous Q6H  . metoCLOPramide (REGLAN) injection  10 mg Intravenous Q6H  . pantoprazole (PROTONIX) IV  40 mg Intravenous Q2200    Continuous Infusions: . Marland KitchenTPN (CLINIMIX-E) Adult    . 0.9 % NaCl with KCl 20 mEq / L 125 mL/hr at 08/28/12 0608  . 0.9 % NaCl with KCl 20 mEq / L      Past Medical History  Diagnosis Date  . Urinary anastomotic stricture undiagnosed     hard to be cathed; urology did the last time  . Diverticulitis   .  Colon obstruction 01/17/2012  . Obesity, Class III, BMI 40-49.9 (morbid obesity) 01/17/2012  . Complication of anesthesia     difficulty waking up -" irratic breathing "  . History of kidney stones   . Insomnia   . History of elevated glucose     while in hospital - no problem since  . Colostomy in place   . Intra-abdominal abscess, diverticular s/p surgical drainage 01/21/2012    Past Surgical History  Procedure Laterality Date  . Partial colectomy  01/17/2012    Procedure: PARTIAL COLECTOMY;  Surgeon: Clovis Pu. Cornett, MD;  Location: [facility];  Service: General;  Laterality: N/A;  . Colostomy  01/17/2012    Procedure: COLOSTOMY;  Surgeon: Clovis Pu. Cornett, MD;  Location: [facility];  Service: General;  Laterality: N/A;  . Stoma  revision  05/16/2012    Procedure: STOMA REVISION;  Surgeon: Clovis Pu. Cornett, MD;  Location: [facility];  Service: General;  Laterality: N/A;  Dilation of ostomy  . Colectomy N/A 08/23/2012    Procedure: PARTIAL COLECTOMY, ostomy closure, flexible and rigid sigmoidectomy, placement of biological mesh, application of wound vac debridement of left thigh wound, and appendectomy;  Surgeon: Clovis Pu. Cornett, MD;  Location: [facility];  Service: General;  Laterality: N/A;  . Ostomy N/A 08/23/2012    Procedure: OSTOMY closure;  Surgeon: Clovis Pu. Cornett, MD;  Location: [facility];  Service: General;  Laterality: N/A;  . Wound debridement Left 08/23/2012    Procedure: DEBRIDEMENT WOUND LEFT THIGH;  Surgeon: Clovis Pu. Cornett, MD;  Location: [facility];  Service: General;  Laterality: Left;  . Appendectomy N/A 08/23/2012    Procedure: APPENDECTOMY;  Surgeon: Clovis Pu. Cornett, MD;  Location: [facility];  Service: General;  Laterality: N/A;  . Application of wound vac  08/23/2012    Procedure: APPLICATION OF WOUND VAC;  Surgeon: Clovis Pu. Cornett, MD;  Location: [facility];  Service: General;;     Levon Hedger MS, RD, LDN [phone] Pager [phone] After Hours Pager

## 2012-08-28 NOTE — Progress Notes (Signed)
PARENTERAL NUTRITION CONSULT NOTE - INITIAL  Pharmacy Consult for TNA Indication: prolonged ileus  Allergies  Allergen Reactions  . Ivp Dye (Iodinated Diagnostic Agents) Other (See Comments)    Kidney failure    Patient Measurements: Height: 5\' 10"  (177.8 cm) Weight: 274 lb (124.286 kg) IBW/kg (Calculated) : 73 Adjusted Body Weight: 88.4 kg  Vital Signs: Temp: 98.6 F (37 C) (05/13 0500) Temp src: Oral (05/13 0500) BP: 134/79 mmHg (05/13 0500) Pulse Rate: 98 (05/13 0500) Intake/Output from previous day: 05/12 0701 - 05/13 0700 In: 2279.2 [I.V.:2279.2] Out: 2850 [Urine:2000; Emesis/NG output:850] Intake/Output from this shift:    Labs:  Recent Labs  08/28/12 0430  WBC 8.6  HGB 11.4*  HCT 34.0*  PLT 399     Recent Labs  08/28/12 0430  NA 136  K 4.0  CL 103  CO2 20  GLUCOSE 66*  BUN 6  CREATININE 0.48*  CALCIUM 8.8  PROT 6.0  ALBUMIN 2.5*  AST 15  ALT 17  ALKPHOS 55  BILITOT 0.3   Estimated Creatinine Clearance: 162.3 ml/min (by C-G formula based on Cr of 0.48).   No results found for this basename: GLUCAP,  in the last 72 hours  Medical History: Past Medical History  Diagnosis Date  . Urinary anastomotic stricture undiagnosed     hard to be cathed; urology did the last time  . Diverticulitis   . Colon obstruction 01/17/2012  . Obesity, Class III, BMI 40-49.9 (morbid obesity) 01/17/2012  . Complication of anesthesia     difficulty waking up -" irratic breathing "  . History of kidney stones   . Insomnia   . History of elevated glucose     while in hospital - no problem since  . Colostomy in place   . Intra-abdominal abscess, diverticular s/p surgical drainage 01/21/2012    Assessment:  41 yom with h/o diverticular dz with stricture s/p emergent descending colectomy in 01/2012 with persistent colocutaneous fistula s/p partial colectomy, ostomy closure, debridement of L thigh wound and appendectomy 08/23/12.  PICC placed 5/9 given poor venous  access.  TNA ordered to start 5/13 for prolonged ileus.    Nutritional Goals:  - RD recs: pending  Current nutrition:  - Diet: NPO except for Ice Chips starting 5/8 - TNA: to start 5/13 - mIVF: NS + 20 mEq/L KCl @ 125 ml/hr  CBGs & Insulin requirements past 24 hours:  - CBGs controlled - no insulins on board  Labs: Electrolytes:  K,  Renal Function: Scr low 0.48 Hepatic Function: wnl 5/13 Pre-Albumin: pending labs 5/14 Triglycerides: pending labs 5/14 CBGs: controlled - no h/o DM   Plan:  At 1800 tonight  Start Clinimix E 5/20 @ 40 ml/hr  TNA to contain IV fat emulsion 20% at 10 ml/hr on MWF only due to ongoing shortage  Standard multivitamins and trace elements on MWF only due to ongoing shortage  Reduce IVF to 85 ml/hr.  Add sensitive SSI q6h  TNA labs Monday/Thursdays  Dietician consult  Pharmacy will follow up daily  Geoffry Paradise, PharmD, BCPS Pager: [phone] 7:50 AM Pharmacy #: [phone]

## 2012-08-28 NOTE — Progress Notes (Signed)
5 Days Post-Op  Subjective: Pt with nausea and no flatus  Objective: Vital signs in last 24 hours: Temp:  [98.6 F (37 C)-98.9 F (37.2 C)] 98.6 F (37 C) (05/13 0500) Pulse Rate:  [96-98] 98 (05/13 0500) Resp:  [11-18] 15 (05/13 0500) BP: (115-134)/(67-79) 134/79 mmHg (05/13 0500) SpO2:  [97 %-100 %] 98 % (05/13 0500) FiO2 (%):  [38 %] 38 % (05/13 0354) Last BM Date: 08/23/12  Intake/Output from previous day: 05/12 0701 - 05/13 0700 In: 2279.2 [I.V.:2279.2] Out: 2850 [Urine:2000; Emesis/NG output:850] Intake/Output this shift:    Incision/Wound:vac in place ostomy with dressing   BS present.  Not distended.  Lab Results:   Recent Labs  08/28/12 0430  WBC 8.6  HGB 11.4*  HCT 34.0*  PLT 399   BMET  Recent Labs  08/28/12 0430  NA 136  K 4.0  CL 103  CO2 20  GLUCOSE 66*  BUN 6  CREATININE 0.48*  CALCIUM 8.8   PT/INR No results found for this basename: LABPROT, INR,  in the last 72 hours ABG No results found for this basename: PHART, PCO2, PO2, HCO3,  in the last 72 hours  Studies/Results: No results found.  Anti-infectives: Anti-infectives   Start     Dose/Rate Route Frequency Ordered Stop   08/23/12 2000  cefOXitin (MEFOXIN) 1 g in dextrose 5 % 50 mL IVPB     1 g 100 mL/hr over 30 Minutes Intravenous Every 6 hours 08/23/12 1759 08/24/12 0846   08/23/12 0513  ertapenem (INVANZ) 1 g in sodium chloride 0.9 % 50 mL IVPB     1 g 100 mL/hr over 30 Minutes Intravenous On call to O.R. 08/23/12 1914 08/23/12 0730   08/23/12 0512  cefoTEtan (CEFOTAN) 2 g in dextrose 5 % 50 mL IVPB     2 g 100 mL/hr over 30 Minutes Intravenous On call to O.R. 08/23/12 7829 08/23/12 0745      Assessment/Plan: s/p Procedure(s): PARTIAL COLECTOMY, ostomy closure, flexible and rigid sigmoidectomy, placement of biological mesh, application of wound vac debridement of left thigh wound, and appendectomy (N/A) OSTOMY closure (N/A) DEBRIDEMENT WOUND LEFT THIGH  (Left) APPENDECTOMY (N/A) APPLICATION OF WOUND VAC Start TNA for prolonged ileus KUB OOB / ambulate Try reglan for nausea   LOS: 5 days    Kolsen Choe A. 08/28/2012

## 2012-08-29 LAB — DIFFERENTIAL
Basophils Absolute: 0.1 10*3/uL (ref 0.0–0.1)
Basophils Relative: 1 % (ref 0–1)
Eosinophils Absolute: 0.4 10*3/uL (ref 0.0–0.7)
Eosinophils Relative: 5 % (ref 0–5)
Lymphs Abs: 1.2 10*3/uL (ref 0.7–4.0)

## 2012-08-29 LAB — CBC
MCH: 30.7 pg (ref 26.0–34.0)
MCHC: 34.2 g/dL (ref 30.0–36.0)
MCV: 89.5 fL (ref 78.0–100.0)
Platelets: 465 10*3/uL — ABNORMAL HIGH (ref 150–400)
RDW: 13.1 % (ref 11.5–15.5)

## 2012-08-29 LAB — COMPREHENSIVE METABOLIC PANEL
CO2: 21 mEq/L (ref 19–32)
Calcium: 9.5 mg/dL (ref 8.4–10.5)
Creatinine, Ser: 0.45 mg/dL — ABNORMAL LOW (ref 0.50–1.35)
GFR calc Af Amer: >90 mL/min (ref >90)
GFR calc non Af Amer: >90 mL/min (ref >90)
Glucose, Bld: 154 mg/dL — ABNORMAL HIGH (ref 70–99)
Total Protein: 6.7 g/dL (ref 6.0–8.3)

## 2012-08-29 LAB — MAGNESIUM: Magnesium: 2 mg/dL (ref 1.5–2.5)

## 2012-08-29 LAB — GLUCOSE, CAPILLARY: Glucose-Capillary: 161 mg/dL — ABNORMAL HIGH (ref 70–99)

## 2012-08-29 LAB — PHOSPHORUS: Phosphorus: 2.1 mg/dL — ABNORMAL LOW (ref 2.3–4.6)

## 2012-08-29 LAB — PREALBUMIN: Prealbumin: 8.6 mg/dL — ABNORMAL LOW (ref 17.0–34.0)

## 2012-08-29 MED ORDER — KETOROLAC TROMETHAMINE 30 MG/ML IJ SOLN
30.0000 mg | Freq: Four times a day (QID) | INTRAMUSCULAR | Status: AC | PRN
  Administered 2012-08-29 – 2012-09-03 (×8): 30 mg via INTRAVENOUS
  Filled 2012-08-29 (×8): qty 1

## 2012-08-29 MED ORDER — POTASSIUM PHOSPHATE DIBASIC 3 MMOLE/ML IV SOLN
10.0000 mmol | Freq: Once | INTRAVENOUS | Status: AC
  Administered 2012-08-29: 10 mmol via INTRAVENOUS
  Filled 2012-08-29: qty 3.33

## 2012-08-29 MED ORDER — FAT EMULSION 20 % IV EMUL
240.0000 mL | INTRAVENOUS | Status: AC
  Administered 2012-08-29: 240 mL via INTRAVENOUS
  Filled 2012-08-29: qty 250

## 2012-08-29 MED ORDER — TRACE MINERALS CR-CU-F-FE-I-MN-MO-SE-ZN IV SOLN
INTRAVENOUS | Status: AC
  Administered 2012-08-29: 17:00:00 via INTRAVENOUS
  Filled 2012-08-29: qty 2000

## 2012-08-29 NOTE — Progress Notes (Signed)
6 Days Post-Op  Subjective: NO FLATUS rough night he says  Objective: Vital signs in last 24 hours: Temp:  [98.2 F (36.8 C)-99.3 F (37.4 C)] 98.2 F (36.8 C) (05/14 0534) Pulse Rate:  [89-98] 94 (05/14 0534) Resp:  [13-19] 16 (05/14 0534) BP: (113-131)/(78-80) 127/80 mmHg (05/14 0534) SpO2:  [96 %-98 %] 96 % (05/14 0534) Last BM Date: 08/23/12  Intake/Output from previous day: 05/13 0701 - 05/14 0700 In: 3115.8 [I.V.:2598.5; TPN:517.3] Out: 1950 [Urine:900; Emesis/NG output:650; Drains:400] Intake/Output this shift:    Incision/Wound:vac in place ostomy site clean.  Soft obese  Quiet.  Eating ice  Lab Results:   Recent Labs  08/28/12 0430 08/29/12 0731  WBC 8.6 9.3  HGB 11.4* 12.6*  HCT 34.0* 36.8*  PLT 399 465*   BMET  Recent Labs  08/28/12 0430  NA 136  K 4.0  CL 103  CO2 20  GLUCOSE 66*  BUN 6  CREATININE 0.48*  CALCIUM 8.8   PT/INR No results found for this basename: LABPROT, INR,  in the last 72 hours ABG No results found for this basename: PHART, PCO2, PO2, HCO3,  in the last 72 hours  Studies/Results: Dg Abd 1 View  08/28/2012   *RADIOLOGY REPORT*  Clinical Data: Abdominal pain, nausea, evaluate ileus  ABDOMEN - 1 VIEW  Comparison: 06/29/2012  Findings: Question gastrostomy tube. Retained contrast in distal colon. Bowel anastomotic staple lines in left mid abdomen. Normal bowel gas pattern without dilatation or wall thickening. Question enteric tube projects over the upper mid abdomen. Bones unremarkable. Previously seen pigtail drainage catheter in left upper quadrant no longer identified. No urinary tract calcification.  IMPRESSION: Normal bowel gas pattern.   Original Report Authenticated By: Ulyses Southward, M.D.    Anti-infectives: Anti-infectives   Start     Dose/Rate Route Frequency Ordered Stop   08/23/12 2000  cefOXitin (MEFOXIN) 1 g in dextrose 5 % 50 mL IVPB     1 g 100 mL/hr over 30 Minutes Intravenous Every 6 hours 08/23/12 1759  08/24/12 0846   08/23/12 0513  ertapenem (INVANZ) 1 g in sodium chloride 0.9 % 50 mL IVPB     1 g 100 mL/hr over 30 Minutes Intravenous On call to O.R. 08/23/12 1191 08/23/12 0730   08/23/12 0512  cefoTEtan (CEFOTAN) 2 g in dextrose 5 % 50 mL IVPB     2 g 100 mL/hr over 30 Minutes Intravenous On call to O.R. 08/23/12 4782 08/23/12 0745      Assessment/Plan: s/p Procedure(s): PARTIAL COLECTOMY, ostomy closure, flexible and rigid sigmoidectomy, placement of biological mesh, application of wound vac debridement of left thigh wound, and appendectomy (N/A) OSTOMY closure (N/A) DEBRIDEMENT WOUND LEFT THIGH (Left) APPENDECTOMY (N/A) APPLICATION OF WOUND VAC Films negative for obstruction.  Ileus   Start TNA VAC CHANGE  AWAIT RETURN OF BOWEL FUNCTION KEEP NG until flatus Labs OK decrease IVF  LOS: 6 days    [name] A. 08/29/2012

## 2012-08-29 NOTE — Progress Notes (Signed)
Pt's wound vac dressing to abdomen today changed. Wound measurements= L=22; W=11 & D=6.

## 2012-08-29 NOTE — Progress Notes (Signed)
PARENTERAL NUTRITION CONSULT NOTE - INITIAL  Pharmacy Consult for TNA Indication: prolonged ileus  Allergies  Allergen Reactions  . Ivp Dye (Iodinated Diagnostic Agents) Other (See Comments)    Kidney failure    Patient Measurements: Height: 5\' 10"  (177.8 cm) Weight: 274 lb (124.286 kg) IBW/kg (Calculated) : 73 Adjusted Body Weight: 88.4 kg  Vital Signs: Temp: 98.2 F (36.8 C) (05/14 0534) Temp src: Oral (05/14 0534) BP: 127/80 mmHg (05/14 0534) Pulse Rate: 94 (05/14 0534) Intake/Output from previous day: 05/13 0701 - 05/14 0700 In: 3115.8 [I.V.:2598.5; TPN:517.3] Out: 1950 [Urine:900; Emesis/NG output:650; Drains:400] Intake/Output from this shift:    Labs:  Recent Labs  08/28/12 0430  WBC 8.6  HGB 11.4*  HCT 34.0*  PLT 399     Recent Labs  08/28/12 0430  NA 136  K 4.0  CL 103  CO2 20  GLUCOSE 66*  BUN 6  CREATININE 0.48*  CALCIUM 8.8  PROT 6.0  ALBUMIN 2.5*  AST 15  ALT 17  ALKPHOS 55  BILITOT 0.3   Estimated Creatinine Clearance: 162.3 ml/min (by C-G formula based on Cr of 0.48).    Recent Labs  08/28/12 1824 08/28/12 2345 08/29/12 0528  GLUCAP 91 117* 144*    Medical History: Past Medical History  Diagnosis Date  . Urinary anastomotic stricture undiagnosed     hard to be cathed; urology did the last time  . Diverticulitis   . Colon obstruction 01/17/2012  . Obesity, Class III, BMI 40-49.9 (morbid obesity) 01/17/2012  . Complication of anesthesia     difficulty waking up -" irratic breathing "  . History of kidney stones   . Insomnia   . History of elevated glucose     while in hospital - no problem since  . Colostomy in place   . Intra-abdominal abscess, diverticular s/p surgical drainage 01/21/2012     Assessment:  41 yom with h/o diverticular dz with stricture s/p emergent descending colectomy in 01/2012 with persistent colocutaneous fistula s/p partial colectomy, ostomy closure, debridement of L thigh wound and appendectomy  08/23/12.  PICC placed 5/9 given poor venous access.  TNA started 5/13 for prolonged ileus.   5/14: D#2 TNA. Note new nutritional goals recommended by RD. Await return of bowel function. NG output 650 ml yesterday.     Nutritional Goals:  - RD recs 5/13: 1900-2250 Kcal, 115-135g protein, 1.9-2.2L/day - Clinimix E 5/20 @ 90 ml/hr will provide 108 g protein, average of 2107 Kcal/day   Current nutrition:  - Diet: NPO except for Ice Chips starting 5/8 - TNA: Clinimix E 5/20 @ 40 ml/hr - mIVF: NS + 20 mEq/L KCl changed by MD to 50 ml/hr on 5/14  CBGs & Insulin requirements past 24 hours:  - CBGs < 150, required 1 unit of Novolog SSI  Labs: Awaiting labs for 5/14 Electrolytes:  Na+ 133, K+ 3.6, phos 2.1, Mag wnl Renal Function: Scr low, decreasing.  Hepatic Function: wnl Pre-Albumin: pending 5/14 labs Triglycerides: pending 5/14 labs CBGs: controlled - no h/o DM, now on sensitive SSI   Plan:  At 1800 tonight  Increase Clinimix E 5/20 to 60 ml/hr  TNA to contain IV fat emulsion 20% at 10 ml/hr on MWF only due to ongoing shortage  Standard multivitamins and trace elements on MWF only due to ongoing shortage  Kphos 10 mMol IV x 1   Continue IVF at 50 ml/hr as adjusted by MD this morning  Continue sensitive SSI q6h  TNA labs Monday/Thursdays  Pharmacy will follow up daily  Geoffry Paradise, PharmD, BCPS Pager: [phone] 7:24 AM Pharmacy #: [phone]

## 2012-08-30 LAB — COMPREHENSIVE METABOLIC PANEL
ALT: 35 U/L (ref 0–53)
Albumin: 2.9 g/dL — ABNORMAL LOW (ref 3.5–5.2)
Alkaline Phosphatase: 67 U/L (ref 39–117)
Chloride: 102 mEq/L (ref 96–112)
Glucose, Bld: 164 mg/dL — ABNORMAL HIGH (ref 70–99)
Potassium: 3.2 mEq/L — ABNORMAL LOW (ref 3.5–5.1)
Sodium: 138 mEq/L (ref 135–145)
Total Bilirubin: 0.3 mg/dL (ref 0.3–1.2)
Total Protein: 6.9 g/dL (ref 6.0–8.3)

## 2012-08-30 LAB — GLUCOSE, CAPILLARY
Glucose-Capillary: 160 mg/dL — ABNORMAL HIGH (ref 70–99)
Glucose-Capillary: 123 mg/dL — ABNORMAL HIGH (ref 70–99)

## 2012-08-30 MED ORDER — POTASSIUM CHLORIDE IN NACL 20-0.9 MEQ/L-% IV SOLN
INTRAVENOUS | Status: DC
  Administered 2012-08-31: 20 mL via INTRAVENOUS
  Administered 2012-09-03: 20 mL/h via INTRAVENOUS
  Filled 2012-08-30 (×3): qty 1000

## 2012-08-30 MED ORDER — POTASSIUM CHLORIDE 10 MEQ/100ML IV SOLN
10.0000 meq | INTRAVENOUS | Status: AC
  Administered 2012-08-30 (×4): 10 meq via INTRAVENOUS
  Filled 2012-08-30 (×4): qty 100

## 2012-08-30 MED ORDER — POTASSIUM CHLORIDE 10 MEQ/100ML IV SOLN
10.0000 meq | INTRAVENOUS | Status: AC
  Administered 2012-08-30 (×2): 10 meq via INTRAVENOUS
  Filled 2012-08-30 (×2): qty 100

## 2012-08-30 MED ORDER — CLINIMIX E/DEXTROSE (5/15) 5 % IV SOLN
INTRAVENOUS | Status: AC
  Administered 2012-08-30: 18:00:00 via INTRAVENOUS
  Filled 2012-08-30: qty 2000

## 2012-08-30 NOTE — Progress Notes (Signed)
7 Days Post-Op  Subjective: FELT SOME RUMBLING BUT NO BM FLATUS  Objective: Vital signs in last 24 hours: Temp:  [97.6 F (36.4 C)-99.5 F (37.5 C)] 99.5 F (37.5 C) (05/15 0520) Pulse Rate:  [83-96] 96 (05/15 0520) Resp:  [16-20] 18 (05/15 0520) BP: (107-115)/(73-82) 107/73 mmHg (05/15 0520) SpO2:  [96 %-98 %] 98 % (05/15 0520) Last BM Date: 08/23/12  Intake/Output from previous day: 05/14 0701 - 05/15 0700 In: 2865.2 [I.V.:1273.5; IV Piggyback:253.3; TPN:1338.3] Out: 2120 [Urine:1350; Emesis/NG output:580; Drains:190] Intake/Output this shift:    Incision/Wound:VAC IN PLACE OSTOMY SITE INTACT  SOFT  WOUND LEFT INNER THIGH STABLE   Lab Results:   Recent Labs  08/28/12 0430 08/29/12 0731  WBC 8.6 9.3  HGB 11.4* 12.6*  HCT 34.0* 36.8*  PLT 399 465*   BMET  Recent Labs  08/29/12 0731 08/30/12 0406  NA 133* 138  K 3.6 3.2*  CL 100 102  CO2 21 26  GLUCOSE 154* 164*  BUN 5* 7  CREATININE 0.45* 0.47*  CALCIUM 9.5 9.7   PT/INR No results found for this basename: LABPROT, INR,  in the last 72 hours ABG No results found for this basename: PHART, PCO2, PO2, HCO3,  in the last 72 hours  Studies/Results: Dg Abd 1 View  08/28/2012   *RADIOLOGY REPORT*  Clinical Data: Abdominal pain, nausea, evaluate ileus  ABDOMEN - 1 VIEW  Comparison: 06/29/2012  Findings: Question gastrostomy tube. Retained contrast in distal colon. Bowel anastomotic staple lines in left mid abdomen. Normal bowel gas pattern without dilatation or wall thickening. Question enteric tube projects over the upper mid abdomen. Bones unremarkable. Previously seen pigtail drainage catheter in left upper quadrant no longer identified. No urinary tract calcification.  IMPRESSION: Normal bowel gas pattern.   Original Report Authenticated By: Ulyses Southward, M.D.    Anti-infectives: Anti-infectives   Start     Dose/Rate Route Frequency Ordered Stop   08/23/12 2000  cefOXitin (MEFOXIN) 1 g in dextrose 5 % 50 mL  IVPB     1 g 100 mL/hr over 30 Minutes Intravenous Every 6 hours 08/23/12 1759 08/24/12 0846   08/23/12 0513  ertapenem (INVANZ) 1 g in sodium chloride 0.9 % 50 mL IVPB     1 g 100 mL/hr over 30 Minutes Intravenous On call to O.R. 08/23/12 1191 08/23/12 0730   08/23/12 0512  cefoTEtan (CEFOTAN) 2 g in dextrose 5 % 50 mL IVPB     2 g 100 mL/hr over 30 Minutes Intravenous On call to O.R. 08/23/12 4782 08/23/12 0745      Assessment/Plan: s/p Procedure(s): PARTIAL COLECTOMY, ostomy closure, flexible and rigid sigmoidectomy, placement of biological mesh, application of wound vac debridement of left thigh wound, and appendectomy (N/A) OSTOMY closure (N/A) DEBRIDEMENT WOUND LEFT THIGH (Left) APPENDECTOMY (N/A) APPLICATION OF WOUND VAC REPLACE K Ileus  Try clamping NGT and check residuals.  OOB WOUNDS OK   LOS: 7 days    Jadarrius Maselli A. 08/30/2012

## 2012-08-30 NOTE — Progress Notes (Addendum)
PARENTERAL NUTRITION CONSULT NOTE - Follow Up  Pharmacy Consult for TNA Indication: prolonged ileus  Allergies  Allergen Reactions  . Ivp Dye (Iodinated Diagnostic Agents) Other (See Comments)    Kidney failure    Patient Measurements: Height: 5\' 10"  (177.8 cm) Weight: 274 lb (124.286 kg) IBW/kg (Calculated) : 73 Adjusted Body Weight: 88.4 kg  Vital Signs: Temp: 99.5 F (37.5 C) (05/15 0520) Temp src: Oral (05/15 0520) BP: 107/73 mmHg (05/15 0520) Pulse Rate: 96 (05/15 0520) Intake/Output from previous day: 05/14 0701 - 05/15 0700 In: 2865.2 [I.V.:1273.5; IV Piggyback:253.3; TPN:1338.3] Out: 2120 [Urine:1350; Emesis/NG output:580; Drains:190] Intake/Output from this shift:    Labs:  Recent Labs  08/28/12 0430 08/29/12 0731  WBC 8.6 9.3  HGB 11.4* 12.6*  HCT 34.0* 36.8*  PLT 399 465*     Recent Labs  08/28/12 0430 08/29/12 0731 08/30/12 0406  NA 136 133* 138  K 4.0 3.6 3.2*  CL 103 100 102  CO2 20 21 26   GLUCOSE 66* 154* 164*  BUN 6 5* 7  CREATININE 0.48* 0.45* 0.47*  CALCIUM 8.8 9.5 9.7  MG  --  2.0 1.8  PHOS  --  2.1* 2.7  PROT 6.0 6.7 6.9  ALBUMIN 2.5* 2.9* 2.9*  AST 15 16 26   ALT 17 19 35  ALKPHOS 55 65 67  BILITOT 0.3 0.3 0.3  PREALBUMIN  --  8.6*  --   TRIG  --  257*  --   CHOL  --  182  --    Estimated Creatinine Clearance: 162.3 ml/min (by C-G formula based on Cr of 0.47).    Recent Labs  08/29/12 1748 08/29/12 2337 08/30/12 0514  GLUCAP 161* 161* 160*    CBGs & Insulin requirements past 24 hours:  - CBGs 159-161,  required 8 units of Novolog SSI   Nutritional Goals:  - RD recs 5/13: 1900-2250 Kcal, 115-135g protein, 1.9-2.2L/day - Clinimix E 5/15 @ 100 ml/hr will provide 120 g protein, average of 1910 Kcal/day  Current nutrition:  - Diet: NPO except for Ice Chips starting 5/8 - TNA: Clinimix E 5/20 @ 60 ml/hr - mIVF: NS + 20 mEq/L KCl at 50 ml/hr  Assessment:  41 yom with h/o diverticular dz with stricture s/p  emergent descending colectomy in 01/2012 with persistent colocutaneous fistula s/p partial colectomy, ostomy closure, debridement of L thigh wound and appendectomy 08/23/12.  PICC placed 5/9 given poor venous access.  TNA started 5/13 for prolonged ileus.   5/15: D#3 TNA. Currently on Clinimix E 5/20 formulation.  In order to meet both protein and kcal goals defined by the RD, will switch to Clinimix E 5/15 and plan to titrate up to goal rate by the weekend if patient tolerates.  NGT output 580 mL yesterday, plan to try clamping NGT today.  Labs:  Electrolytes:  K+ 3.2 - MD has ordered 2 runs.  Will order an additional 4 runs since would like K+ closer to 4.0 in presence of ileus (pt also receiving KCl in MIVF).  Phos WNL after supplementing yesterday, Mag wnl. Renal Function: Scr low, stable.  Hepatic Function: LFTs wnl Pre-Albumin: 8.6 (5/14) Triglycerides: Triglycerides 257 (5/14) - keep lipids MWF for now, will continue to monitor CBGs: all just above goal.  Switching to 5/15 formulation today (less dextrose) so will f/u CBGs in AM to see if patient requires insulin in TNA.  No h/o DM, now on sensitive SSI.   Plan:  At 1800 tonight  Switch to Clinimix  E 5/15 at 80 ml/hr.  This is an increase in rate as well.  TNA to contain IV fat emulsion 20% at 10 ml/hr on MWF only due to ongoing shortage  Standard multivitamins and trace elements on MWF only due to ongoing shortage  KCl 10 mEq/100 mL IV x 4 runs (for 6 runs total this AM).  Reduce IVF to 20 ml/hr to account for extra volume provided by TNA.  Continue sensitive SSI q6h.  TNA labs Monday/Thursdays.  BMET, Mag, Phos in AM.  Pharmacy will follow up daily.  Clance Boll, PharmD, BCPS Pager: [phone] 08/30/2012 7:14 AM

## 2012-08-31 LAB — BASIC METABOLIC PANEL
GFR calc Af Amer: >90 mL/min (ref >90)
GFR calc non Af Amer: >90 mL/min (ref >90)
Potassium: 3.7 mEq/L (ref 3.5–5.1)
Sodium: 136 mEq/L (ref 135–145)

## 2012-08-31 LAB — GLUCOSE, CAPILLARY
Glucose-Capillary: 108 mg/dL — ABNORMAL HIGH (ref 70–99)
Glucose-Capillary: 115 mg/dL — ABNORMAL HIGH (ref 70–99)

## 2012-08-31 LAB — MAGNESIUM: Magnesium: 1.8 mg/dL (ref 1.5–2.5)

## 2012-08-31 LAB — PHOSPHORUS: Phosphorus: 3.7 mg/dL (ref 2.3–4.6)

## 2012-08-31 MED ORDER — HYDROMORPHONE HCL PF 1 MG/ML IJ SOLN
1.0000 mg | INTRAMUSCULAR | Status: DC | PRN
  Administered 2012-08-31 – 2012-09-05 (×23): 1 mg via INTRAVENOUS
  Filled 2012-08-31 (×25): qty 1

## 2012-08-31 MED ORDER — CLINIMIX E/DEXTROSE (5/15) 5 % IV SOLN
INTRAVENOUS | Status: AC
  Administered 2012-08-31: 17:00:00 via INTRAVENOUS
  Filled 2012-08-31: qty 2400

## 2012-08-31 MED ORDER — FENTANYL 75 MCG/HR TD PT72
75.0000 ug | MEDICATED_PATCH | TRANSDERMAL | Status: DC
  Administered 2012-08-31: 75 ug via TRANSDERMAL
  Filled 2012-08-31: qty 1

## 2012-08-31 MED ORDER — FAT EMULSION 20 % IV EMUL
240.0000 mL | INTRAVENOUS | Status: AC
  Administered 2012-08-31: 240 mL via INTRAVENOUS
  Filled 2012-08-31: qty 250

## 2012-08-31 NOTE — Progress Notes (Signed)
8 Days Post-Op  Subjective: NO FLATUS BUT NO VOMITING WITH NGT CLAMPED AND VERY LOW RESIDUALS.   Objective: Vital signs in last 24 hours: Temp:  [97.8 F (36.6 C)-98.6 F (37 C)] 98.6 F (37 C) (05/16 0551) Pulse Rate:  [83-108] 83 (05/16 0551) Resp:  [12-24] 18 (05/16 0551) BP: (102-125)/(76-85) 125/79 mmHg (05/16 0551) SpO2:  [95 %-100 %] 98 % (05/16 0551) Last BM Date: 08/23/12  Intake/Output from previous day: 05/15 0701 - 05/16 0700 In: 960 [I.V.:400; TPN:560] Out: 330 [Urine:300; Emesis/NG output:30] Intake/Output this shift:    Incision/Wound:VAC IN PLACE.   OSTOMY SITE OK  SOFT OCCASIONAL BS.   Lab Results:   Recent Labs  08/29/12 0731  WBC 9.3  HGB 12.6*  HCT 36.8*  PLT 465*   BMET  Recent Labs  08/30/12 0406 08/31/12 0555  NA 138 136  K 3.2* 3.7  CL 102 100  CO2 26 28  GLUCOSE 164* 137*  BUN 7 9  CREATININE 0.47* 0.45*  CALCIUM 9.7 9.6   PT/INR No results found for this basename: LABPROT, INR,  in the last 72 hours ABG No results found for this basename: PHART, PCO2, PO2, HCO3,  in the last 72 hours  Studies/Results: No results found.  Anti-infectives: Anti-infectives   Start     Dose/Rate Route Frequency Ordered Stop   08/23/12 2000  cefOXitin (MEFOXIN) 1 g in dextrose 5 % 50 mL IVPB     1 g 100 mL/hr over 30 Minutes Intravenous Every 6 hours 08/23/12 1759 08/24/12 0846   08/23/12 0513  ertapenem (INVANZ) 1 g in sodium chloride 0.9 % 50 mL IVPB     1 g 100 mL/hr over 30 Minutes Intravenous On call to O.R. 08/23/12 1610 08/23/12 0730   08/23/12 0512  cefoTEtan (CEFOTAN) 2 g in dextrose 5 % 50 mL IVPB     2 g 100 mL/hr over 30 Minutes Intravenous On call to O.R. 08/23/12 9604 08/23/12 0745      Assessment/Plan: s/p Procedure(s): PARTIAL COLECTOMY, ostomy closure, flexible and rigid sigmoidectomy, placement of biological mesh, application of wound vac debridement of left thigh wound, and appendectomy (N/A) OSTOMY closure  (N/A) DEBRIDEMENT WOUND LEFT THIGH (Left) APPENDECTOMY (N/A) APPLICATION OF WOUND VAC Min in NG.  IT HAS BEEN CLAMPED.  WILL D/C TODAY.  KEEP ON ICE.  D/C PCA. START FENTANYL PATCH.   ADD DILAUDID PRN.  CONTINUE TORADOL.   VAC CHANGE.   CONT TNA DUE TO ILEUS.  AMBULATE ELECTROLYTES OK  LOS: 8 days    Jeffrey Faller A. 08/31/2012

## 2012-08-31 NOTE — Progress Notes (Signed)
PARENTERAL NUTRITION CONSULT NOTE - Follow Up  Pharmacy Consult for TNA Indication: prolonged ileus  Allergies  Allergen Reactions  . Ivp Dye (Iodinated Diagnostic Agents) Other (See Comments)    Kidney failure    Patient Measurements: Height: 5\' 10"  (177.8 cm) Weight: 274 lb (124.286 kg) IBW/kg (Calculated) : 73 Adjusted Body Weight: 88.4 kg  Vital Signs: Temp: 98.6 F (37 C) (05/16 0551) Temp src: Oral (05/16 0551) BP: 125/79 mmHg (05/16 0551) Pulse Rate: 83 (05/16 0551) Intake/Output from previous day: 05/15 0701 - 05/16 0700 In: 960 [I.V.:400; TPN:560] Out: 330 [Urine:300; Emesis/NG output:30] Intake/Output from this shift:    Labs:  Recent Labs  08/29/12 0731  WBC 9.3  HGB 12.6*  HCT 36.8*  PLT 465*     Recent Labs  08/29/12 0731 08/30/12 0406 08/31/12 0555  NA 133* 138 136  K 3.6 3.2* 3.7  CL 100 102 100  CO2 21 26 28   GLUCOSE 154* 164* 137*  BUN 5* 7 9  CREATININE 0.45* 0.47* 0.45*  CALCIUM 9.5 9.7 9.6  MG 2.0 1.8 1.8  PHOS 2.1* 2.7 3.7  PROT 6.7 6.9  --   ALBUMIN 2.9* 2.9*  --   AST 16 26  --   ALT 19 35  --   ALKPHOS 65 67  --   BILITOT 0.3 0.3  --   PREALBUMIN 8.6*  --   --   TRIG 257*  --   --   CHOL 182  --   --    Estimated Creatinine Clearance: 162.3 ml/min (by C-G formula based on Cr of 0.45).    Recent Labs  08/30/12 1811 08/30/12 2336 08/31/12 0550  GLUCAP 123* 138* 132*    CBGs & Insulin requirements past 24 hours:  - CBGs 123-143,  required 5 units of Novolog SSI   Nutritional Goals:  - RD recs 5/13: 1900-2250 Kcal, 115-135g protein, 1.9-2.2L/day - Clinimix E 5/15 @ 100 ml/hr will provide 120 g protein, average of 1910 Kcal/day  Current nutrition:  - Diet: NPO except for Ice Chips starting 5/8 - TNA: Clinimix E 5/15 @ 80 ml/hr - mIVF: NS + 20 mEq/L KCl at 20 ml/hr  Assessment:  41 yom with h/o diverticular dz with stricture s/p emergent descending colectomy in 01/2012 with persistent colocutaneous fistula  s/p partial colectomy, ostomy closure, debridement of L thigh wound and appendectomy 08/23/12.  PICC placed 5/9 given poor venous access.  TNA started 5/13 for prolonged ileus.   5/16: D#4 TNA. Switched to Clinimix E 5/15 yesterday in order to meet both protein and kcal goals defined by the RD and plan to titrate up to goal rate today.  NGT clamped yesterday, will be d/c'd today.  Labs:  Electrolytes:  WNL.  Pt received K+ replacement yesterday.   Renal Function: Scr low, stable.  Hepatic Function: LFTs wnl Pre-Albumin: 8.6 (5/14) Triglycerides: Triglycerides 257 (5/14) - keep lipids MWF for now, will continue to monitor CBGs: controlled.  No h/o DM, now on sensitive SSI.   Plan:  At 1800 tonight  Advance Clinimix E 5/15 to goal rate of 100 ml/hr.    TNA to contain IV fat emulsion 20% at 10 ml/hr on MWF only due to ongoing shortage  Standard multivitamins and trace elements on MWF only due to ongoing shortage  MIVF at [facility] to account for extra volume provided by TNA.  Continue sensitive SSI q6h.  TNA labs Monday/Thursdays.  BMET, Mag, Phos in AM.  Pharmacy will follow up daily.  Clance Boll, PharmD, BCPS Pager: [phone] 08/31/2012 7:07 AM

## 2012-09-01 DIAGNOSIS — E46 Unspecified protein-calorie malnutrition: Secondary | ICD-10-CM

## 2012-09-01 LAB — PHOSPHORUS: Phosphorus: 3.9 mg/dL (ref 2.3–4.6)

## 2012-09-01 LAB — BASIC METABOLIC PANEL
BUN: 16 mg/dL (ref 6–23)
Calcium: 9.6 mg/dL (ref 8.4–10.5)
Creatinine, Ser: 0.61 mg/dL (ref 0.50–1.35)
GFR calc non Af Amer: >90 mL/min (ref >90)
Glucose, Bld: 129 mg/dL — ABNORMAL HIGH (ref 70–99)

## 2012-09-01 LAB — GLUCOSE, CAPILLARY: Glucose-Capillary: 126 mg/dL — ABNORMAL HIGH (ref 70–99)

## 2012-09-01 LAB — MAGNESIUM: Magnesium: 2 mg/dL (ref 1.5–2.5)

## 2012-09-01 MED ORDER — POTASSIUM CHLORIDE 10 MEQ/100ML IV SOLN
10.0000 meq | INTRAVENOUS | Status: AC
  Administered 2012-09-01 (×4): 10 meq via INTRAVENOUS
  Filled 2012-09-01 (×4): qty 100

## 2012-09-01 MED ORDER — CLINIMIX E/DEXTROSE (5/15) 5 % IV SOLN
INTRAVENOUS | Status: AC
  Administered 2012-09-01: 17:00:00 via INTRAVENOUS
  Filled 2012-09-01: qty 2400

## 2012-09-01 NOTE — Progress Notes (Signed)
PARENTERAL NUTRITION CONSULT NOTE - Follow Up  Pharmacy Consult for TNA Indication: prolonged ileus  Allergies  Allergen Reactions  . Ivp Dye (Iodinated Diagnostic Agents) Other (See Comments)    Kidney failure    Patient Measurements: Height: 5\' 10"  (177.8 cm) Weight: 274 lb (124.286 kg) IBW/kg (Calculated) : 73 Adjusted Body Weight: 88.4 kg  Vital Signs: Temp: 98.5 F (36.9 C) (05/17 0528) Temp src: Oral (05/17 0528) BP: 107/75 mmHg (05/17 0528) Pulse Rate: 61 (05/17 0528) Intake/Output from previous day: 05/16 0701 - 05/17 0700 In: 2938.7 [I.V.:480; TPN:2458.7] Out: 1150 [Urine:1150] Intake/Output from this shift:    Labs: No results found for this basename: WBC, HGB, HCT, PLT, APTT, INR,  in the last 72 hours   Recent Labs  08/30/12 0406 08/31/12 0555 09/01/12 0510  NA 138 136 136  K 3.2* 3.7 3.3*  CL 102 100 100  CO2 26 28 29   GLUCOSE 164* 137* 129*  BUN 7 9 16   CREATININE 0.47* 0.45* 0.61  CALCIUM 9.7 9.6 9.6  MG 1.8 1.8 2.0  PHOS 2.7 3.7 3.9  PROT 6.9  --   --   ALBUMIN 2.9*  --   --   AST 26  --   --   ALT 35  --   --   ALKPHOS 67  --   --   BILITOT 0.3  --   --    Estimated Creatinine Clearance: 162.3 ml/min (by C-G formula based on Cr of 0.61).    Recent Labs  08/31/12 1842 08/31/12 2327 09/01/12 0526  GLUCAP 113* 115* 126*    CBGs & Insulin requirements past 24 hours:  - CBGs 108-126,  required 1 units of Novolog SSI   Nutritional Goals:  - RD recs 5/13: 1900-2250 Kcal, 115-135g protein, 1.9-2.2L/day - Clinimix E 5/15 @ 100 ml/hr will provide 120 g protein, average of 1910 Kcal/day  Current nutrition:  - Diet: NPO except for Ice Chips starting 5/8 - TNA: Clinimix E 5/15 @ 100 ml/hr - mIVF: NS + 20 mEq/L KCl at [facility]  Assessment:  41 yom with h/o diverticular dz with stricture s/p emergent descending colectomy in 01/2012 with persistent colocutaneous fistula s/p partial colectomy, ostomy closure, debridement of L thigh wound and  appendectomy 08/23/12.  PICC placed 5/9 given poor venous access.  TNA started 5/13 for prolonged ileus.   5/17: D#5 TNA. Switched to Clinimix E 5/15 yesterday in order to meet both protein and kcal goals defined by the RD and plan to titrate up to goal rate today.  NGT d/c on 5/16  Labs:  Electrolytes:  WNL, with the exception of potassium low 3.3, will replace today   Renal Function: Scr wnl, stable.  Hepatic Function: LFTs wnl Pre-Albumin: 8.6 (5/14) Triglycerides: Triglycerides 257 (5/14) - keep lipids MWF for now, will continue to monitor CBGs: controlled.  No h/o DM, now on sensitive SSI.   Plan:  At 1800 tonight  Continue Clinimix E 5/15 at goal rate of 100 ml/hr.    Potassium 10 mEq IV x 4 runs starting at 0900  Repeat BMET, mg, phos in AM  TNA to contain IV fat emulsion 20% at 10 ml/hr on MWF only due to ongoing shortage  Standard multivitamins and trace elements on MWF only due to ongoing shortage  M[facility]F at [facility] to account for extra volume provided by TNA.  Continue sensitive SSI q6h.  TNA labs Monday/Thursdays.  BMET, Mag, Phos in AM.  Pharmacy will follow up daily.  Jaterrius Ricketson, Loma Messing PharmD Pager #: [phone] 7:56 AM 09/01/2012

## 2012-09-01 NOTE — Progress Notes (Addendum)
9 Days Post-Op  Subjective: Passing gas.  Would like to try some liquids.  Objective: Vital signs in last 24 hours: Temp:  [98.4 F (36.9 C)-99.8 F (37.7 C)] 98.5 F (36.9 C) (05/17 0528) Pulse Rate:  [61-89] 61 (05/17 0528) Resp:  [18] 18 (05/17 0528) BP: (107-135)/(68-83) 107/75 mmHg (05/17 0528) SpO2:  [96 %-98 %] 98 % (05/17 0528) Last BM Date: 08/20/12  Intake/Output from previous day: 05/16 0701 - 05/17 0700 In: 2938.7 [I.V.:480; TPN:2458.7] Out: 1150 [Urine:1150] Intake/Output this shift:    PE: General- In NAD Abdomen-soft, VAC on wound dressings dry.  Lab Results:  No results found for this basename: WBC, HGB, HCT, PLT,  in the last 72 hours BMET  Recent Labs  08/31/12 0555 09/01/12 0510  NA 136 136  K 3.7 3.3*  CL 100 100  CO2 28 29  GLUCOSE 137* 129*  BUN 9 16  CREATININE 0.45* 0.61  CALCIUM 9.6 9.6   PT/INR No results found for this basename: LABPROT, INR,  in the last 72 hours Comprehensive Metabolic Panel:    Component Value Date/Time   NA 136 09/01/2012 0510   K 3.3* 09/01/2012 0510   CL 100 09/01/2012 0510   CO2 29 09/01/2012 0510   BUN 16 09/01/2012 0510   CREATININE 0.61 09/01/2012 0510   GLUCOSE 129* 09/01/2012 0510   CALCIUM 9.6 09/01/2012 0510   AST 26 08/30/2012 0406   ALT 35 08/30/2012 0406   ALKPHOS 67 08/30/2012 0406   BILITOT 0.3 08/30/2012 0406   PROT 6.9 08/30/2012 0406   ALBUMIN 2.9* 08/30/2012 0406     Studies/Results: No results found.  Anti-infectives: Anti-infectives   Start     Dose/Rate Route Frequency Ordered Stop   08/23/12 2000  cefOXitin (MEFOXIN) 1 g in dextrose 5 % 50 mL IVPB     1 g 100 mL/hr over 30 Minutes Intravenous Every 6 hours 08/23/12 1759 08/24/12 0846   08/23/12 0513  ertapenem (INVANZ) 1 g in sodium chloride 0.9 % 50 mL IVPB     1 g 100 mL/hr over 30 Minutes Intravenous On call to O.R. 08/23/12 8295 08/23/12 0730   08/23/12 0512  cefoTEtan (CEFOTAN) 2 g in dextrose 5 % 50 mL IVPB     2 g 100 mL/hr  over 30 Minutes Intravenous On call to O.R. 08/23/12 6213 08/23/12 0745      Assessment  s/p Procedure(s):  PARTIAL COLECTOMY, ostomy closure, flexible and rigid sigmoidectomy, placement of biological mesh, application of wound vac debridement of left thigh wound, and appendectomy (N/A)  OSTOMY closure (N/A)  DEBRIDEMENT WOUND LEFT THIGH (Left)  APPENDECTOMY (N/A)  APPLICATION OF WOUND VAC PC malnutrition on TPN -bowel function starting to slowly return Hypokalemia   LOS: 9 days   Plan: Try clear liquids.  Replete potassium.   [name] J 09/01/2012

## 2012-09-02 LAB — GLUCOSE, CAPILLARY
Glucose-Capillary: 142 mg/dL — ABNORMAL HIGH (ref 70–99)
Glucose-Capillary: 113 mg/dL — ABNORMAL HIGH (ref 70–99)
Glucose-Capillary: 111 mg/dL — ABNORMAL HIGH (ref 70–99)

## 2012-09-02 LAB — BASIC METABOLIC PANEL
BUN: 16 mg/dL (ref 6–23)
Chloride: 98 mEq/L (ref 96–112)
Creatinine, Ser: 0.64 mg/dL (ref 0.50–1.35)
GFR calc Af Amer: >90 mL/min (ref >90)

## 2012-09-02 MED ORDER — CLINIMIX/DEXTROSE (5/15) 5 % IV SOLN
INTRAVENOUS | Status: DC
  Administered 2012-09-02: 17:00:00 via INTRAVENOUS
  Filled 2012-09-02: qty 2400

## 2012-09-02 MED ORDER — FENTANYL 25 MCG/HR TD PT72
25.0000 ug | MEDICATED_PATCH | TRANSDERMAL | Status: DC
  Administered 2012-09-02: 25 ug via TRANSDERMAL
  Filled 2012-09-02: qty 1

## 2012-09-02 NOTE — Progress Notes (Signed)
PARENTERAL NUTRITION CONSULT NOTE - Follow Up  Pharmacy Consult for TNA Indication: prolonged ileus  Allergies  Allergen Reactions  . Ivp Dye (Iodinated Diagnostic Agents) Other (See Comments)    Kidney failure    Patient Measurements: Height: 5\' 10"  (177.8 cm) Weight: 274 lb (124.286 kg) IBW/kg (Calculated) : 73 Adjusted Body Weight: 88.4 kg  Vital Signs: Temp: 98.5 F (36.9 C) (05/18 0500) Temp src: Oral (05/18 0500) BP: 112/72 mmHg (05/18 0500) Pulse Rate: 75 (05/18 0500) Intake/Output from previous day: 05/17 0701 - 05/18 0700 In: 3560 [P.O.:960; I.V.:400; TPN:2200] Out: 2050 [Urine:1900; Drains:150] Intake/Output from this shift:    Labs: No results found for this basename: WBC, HGB, HCT, PLT, APTT, INR,  in the last 72 hours   Recent Labs  08/31/12 0555 09/01/12 0510 09/02/12 0340  NA 136 136 133*  K 3.7 3.3* 3.8  CL 100 100 98  CO2 28 29 28   GLUCOSE 137* 129* 111*  BUN 9 16 16   CREATININE 0.45* 0.61 0.64  CALCIUM 9.6 9.6 9.3  MG 1.8 2.0  --   PHOS 3.7 3.9 4.9*   Estimated Creatinine Clearance: 162.3 ml/min (by C-G formula based on Cr of 0.64).    Recent Labs  09/01/12 1831 09/01/12 2351 09/02/12 0607  GLUCAP 110* 134* 142*    CBGs & Insulin requirements past 24 hours:  - CBGs 110-142,  required 4 units of Novolog SSI   Nutritional Goals:  - RD recs 5/13: 1900-2250 Kcal, 115-135g protein, 1.9-2.2L/day - Clinimix E 5/15 @ 100 ml/hr will provide 120 g protein, average of 1910 Kcal/day  Current nutrition:  - Diet: CLD started 5/17 - TNA: Clinimix E 5/15 @ 100 ml/hr - mIVF: NS + 20 mEq/L KCl at [facility]  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  PICC placed 5/9 given poor venous access.  TNA started 5/13 for prolonged ileus.   5/18: D#6 TNA. Switched to  Clinimix E 5/15 in order to meet both protein and kcal goals defined by the RD and plan to titrate up to goal.  NGT d/c on 5/16.  CLD started 5/17 with plans to try today on 5/18.  Bowel function starting to slowly return per MD note.   Labs:  Electrolytes:  Potassium improved after replacement 5/17; Phos has trended up since the start of TNA 2.1  ----- > 4.9.  Will remove electrolytes from TNA today.  Renal Function: Scr wnl, stable.  Hepatic Function: LFTs wnl Pre-Albumin: 8.6 (5/14) Triglycerides: Triglycerides 257 (5/14) - keep lipids MWF for now, will continue to monitor CBGs: controlled.  No h/o DM, now on sensitive SSI.   Plan:  At 1800 tonight  Change to electrolyte free Clinimix  5/15 at goal rate of 100 ml/hr.    TNA to contain IV fat emulsion 20% at 10 ml/hr on MWF only due to ongoing shortage  Standard multivitamins and trace elements on MWF only due to ongoing shortage  MIVF at [facility] to account for extra volume provided by TNA.  Continue sensitive SSI q6h.  TNA labs Monday/Thursdays.  Pharmacy will follow up daily.  Fontaine Hehl, Loma Messing PharmD Pager #: [phone] 8:33 AM 09/02/2012

## 2012-09-02 NOTE — Progress Notes (Signed)
10 Days Post-Op  Subjective:  Tolerating clear liquid diet.  Passing gas.  No BM yet.  Objective: Vital signs in last 24 hours: Temp:  [97.5 F (36.4 C)-98.5 F (36.9 C)] 98.5 F (36.9 C) (05/18 0500) Pulse Rate:  [75-88] 75 (05/18 0500) Resp:  [18] 18 (05/18 0500) BP: (107-113)/(72-77) 112/72 mmHg (05/18 0500) SpO2:  [94 %-97 %] 97 % (05/18 0500) Last BM Date: 08/20/12  Intake/Output from previous day: 05/17 0701 - 05/18 0700 In: 3560 [P.O.:960; I.V.:400; TPN:2200] Out: 2050 [Urine:1900; Drains:150] Intake/Output this shift:    PE: General- In NAD Abdomen-soft, VAC on wound, open wounds on left are clean.  Lab Results:  No results found for this basename: WBC, HGB, HCT, PLT,  in the last 72 hours BMET  Recent Labs  09/01/12 0510 09/02/12 0340  NA 136 133*  K 3.3* 3.8  CL 100 98  CO2 29 28  GLUCOSE 129* 111*  BUN 16 16  CREATININE 0.61 0.64  CALCIUM 9.6 9.3   PT/INR No results found for this basename: LABPROT, INR,  in the last 72 hours Comprehensive Metabolic Panel:    Component Value Date/Time   NA 133* 09/02/2012 0340   K 3.8 09/02/2012 0340   CL 98 09/02/2012 0340   CO2 28 09/02/2012 0340   BUN 16 09/02/2012 0340   CREATININE 0.64 09/02/2012 0340   GLUCOSE 111* 09/02/2012 0340   CALCIUM 9.3 09/02/2012 0340   AST 26 08/30/2012 0406   ALT 35 08/30/2012 0406   ALKPHOS 67 08/30/2012 0406   BILITOT 0.3 08/30/2012 0406   PROT 6.9 08/30/2012 0406   ALBUMIN 2.9* 08/30/2012 0406     Studies/Results: No results found.  Anti-infectives: Anti-infectives   Start     Dose/Rate Route Frequency Ordered Stop   08/23/12 2000  cefOXitin (MEFOXIN) 1 g in dextrose 5 % 50 mL IVPB     1 g 100 mL/hr over 30 Minutes Intravenous Every 6 hours 08/23/12 1759 08/24/12 0846   08/23/12 0513  ertapenem (INVANZ) 1 g in sodium chloride 0.9 % 50 mL IVPB     1 g 100 mL/hr over 30 Minutes Intravenous On call to O.R. 08/23/12 1610 08/23/12 0730   08/23/12 0512  cefoTEtan (CEFOTAN) 2 g  in dextrose 5 % 50 mL IVPB     2 g 100 mL/hr over 30 Minutes Intravenous On call to O.R. 08/23/12 9604 08/23/12 0745      Assessment  s/p Procedure(s):  PARTIAL COLECTOMY, ostomy closure, flexible and rigid sigmoidectomy, placement of biological mesh, application of wound vac debridement of left thigh wound, and appendectomy (N/A) -tolerating clear liquids. OSTOMY closure (N/A)  DEBRIDEMENT WOUND LEFT THIGH (Left)  APPENDECTOMY (N/A)  APPLICATION OF WOUND VAC PC malnutrition on TPN Hypokalemia-resolved.   LOS: 10 days   Plan: Advance to full liquids.   Tahnee Cifuentes J 09/02/2012

## 2012-09-03 LAB — CBC
HCT: 36.8 % — ABNORMAL LOW (ref 39.0–52.0)
Hemoglobin: 11.8 g/dL — ABNORMAL LOW (ref 13.0–17.0)
MCH: 29.4 pg (ref 26.0–34.0)
MCV: 91.5 fL (ref 78.0–100.0)
RBC: 4.02 MIL/uL — ABNORMAL LOW (ref 4.22–5.81)

## 2012-09-03 LAB — COMPREHENSIVE METABOLIC PANEL
ALT: 50 U/L (ref 0–53)
AST: 24 U/L (ref 0–37)
Albumin: 2.8 g/dL — ABNORMAL LOW (ref 3.5–5.2)
Alkaline Phosphatase: 103 U/L (ref 39–117)
Glucose, Bld: 129 mg/dL — ABNORMAL HIGH (ref 70–99)
Potassium: 3.8 mEq/L (ref 3.5–5.1)
Sodium: 136 mEq/L (ref 135–145)
Total Protein: 6.4 g/dL (ref 6.0–8.3)

## 2012-09-03 LAB — DIFFERENTIAL
Eosinophils Absolute: 0.5 10*3/uL (ref 0.0–0.7)
Eosinophils Relative: 4 % (ref 0–5)
Lymphocytes Relative: 16 % (ref 12–46)
Lymphs Abs: 2 10*3/uL (ref 0.7–4.0)
Monocytes Relative: 10 % (ref 3–12)

## 2012-09-03 LAB — GLUCOSE, CAPILLARY
Glucose-Capillary: 101 mg/dL — ABNORMAL HIGH (ref 70–99)
Glucose-Capillary: 124 mg/dL — ABNORMAL HIGH (ref 70–99)

## 2012-09-03 MED ORDER — FAT EMULSION 20 % IV EMUL
250.0000 mL | INTRAVENOUS | Status: DC
  Administered 2012-09-03: 250 mL via INTRAVENOUS
  Filled 2012-09-03: qty 250

## 2012-09-03 MED ORDER — CLINIMIX E/DEXTROSE (5/15) 5 % IV SOLN
INTRAVENOUS | Status: DC
  Filled 2012-09-03: qty 2400

## 2012-09-03 MED ORDER — ADULT MULTIVITAMIN W/MINERALS CH
1.0000 | ORAL_TABLET | Freq: Every day | ORAL | Status: DC
  Administered 2012-09-03 – 2012-09-05 (×3): 1 via ORAL
  Filled 2012-09-03 (×3): qty 1

## 2012-09-03 MED ORDER — POLYETHYLENE GLYCOL 3350 17 G PO PACK
17.0000 g | PACK | Freq: Every day | ORAL | Status: DC
  Administered 2012-09-03 – 2012-09-05 (×3): 17 g via ORAL
  Filled 2012-09-03 (×3): qty 1

## 2012-09-03 MED ORDER — CLINIMIX/DEXTROSE (5/15) 5 % IV SOLN
INTRAVENOUS | Status: AC
  Administered 2012-09-03 (×2): via INTRAVENOUS

## 2012-09-03 MED ORDER — CLINIMIX E/DEXTROSE (5/15) 5 % IV SOLN
INTRAVENOUS | Status: DC
  Filled 2012-09-03: qty 2000

## 2012-09-03 NOTE — Progress Notes (Signed)
11 Days Post-Op  Subjective: Had BM  Yesterday.  Tolerating diet  Objective: Vital signs in last 24 hours: Temp:  [98.1 F (36.7 C)-99 F (37.2 C)] 98.7 F (37.1 C) (05/19 0600) Pulse Rate:  [80-82] 81 (05/19 0600) Resp:  [18-20] 20 (05/19 0600) BP: (105)/(57-63) 105/63 mmHg (05/19 0600) SpO2:  [95 %-99 %] 95 % (05/19 0600) Last BM Date: 09/02/12  Intake/Output from previous day: 05/18 0701 - 05/19 0700 In: 5140 [P.O.:60; I.V.:800; TPN:4280] Out: 700 [Urine:700] Intake/Output this shift:    Incision/Wound:vac in place.  Wound clean abdomen soft  Lab Results:   Recent Labs  09/03/12 0430  WBC 11.9*  HGB 11.8*  HCT 36.8*  PLT 441*   BMET  Recent Labs  09/02/12 0340 09/03/12 0430  NA 133* 136  K 3.8 3.8  CL 98 101  CO2 28 25  GLUCOSE 111* 129*  BUN 16 13  CREATININE 0.64 0.59  CALCIUM 9.3 9.1   PT/INR No results found for this basename: LABPROT, INR,  in the last 72 hours ABG No results found for this basename: PHART, PCO2, PO2, HCO3,  in the last 72 hours  Studies/Results: No results found.  Anti-infectives: Anti-infectives   Start     Dose/Rate Route Frequency Ordered Stop   08/23/12 2000  cefOXitin (MEFOXIN) 1 g in dextrose 5 % 50 mL IVPB     1 g 100 mL/hr over 30 Minutes Intravenous Every 6 hours 08/23/12 1759 08/24/12 0846   08/23/12 0513  ertapenem (INVANZ) 1 g in sodium chloride 0.9 % 50 mL IVPB     1 g 100 mL/hr over 30 Minutes Intravenous On call to O.R. 08/23/12 1610 08/23/12 0730   08/23/12 0512  cefoTEtan (CEFOTAN) 2 g in dextrose 5 % 50 mL IVPB     2 g 100 mL/hr over 30 Minutes Intravenous On call to O.R. 08/23/12 9604 08/23/12 0745      Assessment/Plan: s/p Procedure(s): PARTIAL COLECTOMY, ostomy closure, flexible and rigid sigmoidectomy, placement of biological mesh, application of wound vac debridement of left thigh wound, and appendectomy (N/A) OSTOMY closure (N/A) DEBRIDEMENT WOUND LEFT THIGH (Left) APPENDECTOMY  (N/A) APPLICATION OF WOUND VAC Wean TNA to off Advance diet Maybe home tue or wed. Add miralax  LOS: 11 days    Jeffrey Keady A. 09/03/2012

## 2012-09-03 NOTE — Progress Notes (Signed)
PARENTERAL NUTRITION CONSULT NOTE - Follow Up  Pharmacy Consult for TNA Indication: prolonged ileus  Allergies  Allergen Reactions  . Ivp Dye (Iodinated Diagnostic Agents) Other (See Comments)    Kidney failure    Patient Measurements: Height: 5\' 10"  (177.8 cm) Weight: 274 lb (124.286 kg) IBW/kg (Calculated) : 73 Adjusted Body Weight: 88.4 kg  Vital Signs: Temp: 98.7 F (37.1 C) (05/19 0600) Temp src: Oral (05/19 0600) BP: 105/63 mmHg (05/19 0600) Pulse Rate: 81 (05/19 0600) Intake/Output from previous day: 05/18 0701 - 05/19 0700 In: 5140 [P.O.:60; I.V.:800; TPN:4280] Out: 700 [Urine:700] Intake/Output from this shift:    Labs:  Recent Labs  09/03/12 0430  WBC 11.9*  HGB 11.8*  HCT 36.8*  PLT 441*     Recent Labs  09/01/12 0510 09/02/12 0340 09/03/12 0430  NA 136 133* 136  K 3.3* 3.8 3.8  CL 100 98 101  CO2 29 28 25   GLUCOSE 129* 111* 129*  BUN 16 16 13   CREATININE 0.61 0.64 0.59  CALCIUM 9.6 9.3 9.1  MG 2.0  --  1.8  PHOS 3.9 4.9* 3.4  PROT  --   --  6.4  ALBUMIN  --   --  2.8*  AST  --   --  24  ALT  --   --  50  ALKPHOS  --   --  103  BILITOT  --   --  0.3  TRIG  --   --  252*  CHOL  --   --  141   Estimated Creatinine Clearance: 162.3 ml/min (by C-G formula based on Cr of 0.59).    Recent Labs  09/02/12 1711 09/03/12 0002 09/03/12 0518  GLUCAP 111* 124* 145*    CBGs & Insulin requirements past 24 hours:  - CBGs 111-145,  required 1 unit of Novolog SSI   Nutritional Goals:  - RD recs 5/13: 1900-2250 Kcal, 115-135g protein, 1.9-2.2L/day - Clinimix E 5/15 @ 100 ml/hr provides 120 g protein, average of 1910 Kcal/day  Current nutrition:  - Diet: advanced to full liquids 5/18, patient states tolerating well. - TNA: Clinimix 5/15 (plain) @ 100 ml/hr - mIVF: NS + 20 mEq/L KCl at [facility]  Patient states had BM yesterday.   Labs:  Electrolytes:  WNL  (TNA was changed to non-electrolyte formula yesterday due to hyperphosphatemia, now  resolved). Renal Function: SCr wnl, stable.  Hepatic Function: LFTs below ULN Pre-Albumin: 8.6 (5/14), today's value pending Triglycerides: Triglycerides 252 (slightly elevated, but stable). CBGs: controlled.  No h/o DM, now on sensitive-scale SSI.   Assessment:  42 yo M with h/o diverticular dz with stricture s/p emergent descending colectomy in 01/2012 with persistent colocutaneous fistula s/p partial colectomy, ostomy closure, debridement of L thigh wound and appendectomy 08/23/12.  PICC placed 5/9 given poor venous access.  TNA started 5/13 for prolonged ileus.   5/19: D#7 TNA.   Ileus resolving - had BM yesterday  Tolerating [place] diet  Hyperphosphatemia resolved   Plan:   1. Resume electrolytes in TNA (switch to Clinimix-E 5/15).   2. IV fat emulsion 20% at 10 ml/hr (on MWF only due to ongoing shortage) 3. Change multivitamins to oral since tolerating full liquid diet 4. Continue sensitive SSI q6h. 5. Await orders from MD regarding potential further advancement of diet and possible tapering of TNA rate.   Elie Goody, PharmD, BCPS Pager: [phone] 09/03/2012  7:49 AM

## 2012-09-04 LAB — GLUCOSE, CAPILLARY
Glucose-Capillary: 94 mg/dL (ref 70–99)
Glucose-Capillary: 101 mg/dL — ABNORMAL HIGH (ref 70–99)
Glucose-Capillary: 107 mg/dL — ABNORMAL HIGH (ref 70–99)
Glucose-Capillary: 106 mg/dL — ABNORMAL HIGH (ref 70–99)

## 2012-09-04 MED ORDER — HYDROMORPHONE HCL 2 MG PO TABS
1.0000 mg | ORAL_TABLET | ORAL | Status: DC | PRN
  Administered 2012-09-04 (×2): 1 mg via ORAL
  Filled 2012-09-04 (×2): qty 1

## 2012-09-04 MED ORDER — FENTANYL 50 MCG/HR TD PT72
50.0000 ug | MEDICATED_PATCH | TRANSDERMAL | Status: DC
  Administered 2012-09-04: 50 ug via TRANSDERMAL
  Filled 2012-09-04: qty 1

## 2012-09-04 NOTE — Progress Notes (Signed)
NUTRITION FOLLOW UP  Intervention:   - Used teach back method to educate pt on low fiber diet for recent GI surgery - Pt currently eating excellent without any further nutrition concerns, nutrition signing off   Nutrition Dx:   Altered GI function related to prolonged ileus as evidenced by MD notes - resolved    Goal:   TPN to meet >90% of estimated nutritional needs - not met, TPN d/c   Assessment:   POD# 12 partial colectomy, colostomy closure, left thigh wound debridement, and appendectomy and application of wound VAC. NGT d/c 5/16. TPN started post-op for ileus and was d/c yesterday. Pt on dysphagia 3 thin diet, eating 100%. Pt had questions regarding low fiber diet which were answered. Pt had BM 5/18. Noted plans for possible d/c tomorrow.    Height: Ht Readings from Last 1 Encounters:  08/23/12 5\' 10"  (1.778 m)    Weight Status:   Wt Readings from Last 1 Encounters:  08/23/12 274 lb (124.286 kg)    Re-estimated needs:  Kcal: 1900-2250 Protein: 115-135g Fluid: 1.9-2.2L/day  Skin: Abdominal and left thigh incision , negative pressure wound VAC  Diet Order: Dysphagia 3, thin   Intake/Output Summary (Last 24 hours) at 09/04/12 0954 Last data filed at 09/04/12 0600  Gross per 24 hour  Intake 1494.17 ml  Output    250 ml  Net 1244.17 ml    Last BM: 5/19   Labs:   Recent Labs Lab 08/31/12 0555 09/01/12 0510 09/02/12 0340 09/03/12 0430  NA 136 136 133* 136  K 3.7 3.3* 3.8 3.8  CL 100 100 98 101  CO2 28 29 28 25   BUN 9 16 16 13   CREATININE 0.45* 0.61 0.64 0.59  CALCIUM 9.6 9.6 9.3 9.1  MG 1.8 2.0  --  1.8  PHOS 3.7 3.9 4.9* 3.4  GLUCOSE 137* 129* 111* 129*    CBG (last 3)   Recent Labs  09/03/12 1800 09/04/12 0006 09/04/12 0602  GLUCAP 101* 94 101*    Scheduled Meds: . enoxaparin (LOVENOX) injection  40 mg Subcutaneous Q24H  . fentaNYL  25 mcg Transdermal Q72H  . insulin aspart  0-9 Units Subcutaneous Q6H  . metoCLOPramide (REGLAN)  injection  10 mg Intravenous Q6H  . multivitamin with minerals  1 tablet Oral Daily  . polyethylene glycol  17 g Oral Daily     Levon Hedger MS, RD, [place] [phone] Pager [phone] After Hours Pager

## 2012-09-04 NOTE — Progress Notes (Signed)
12 Days Post-Op  Subjective: Tolerating diet.  Had BM last night.  In good spirits.   Objective: Vital signs in last 24 hours: Temp:  [97.9 F (36.6 C)-99 F (37.2 C)] 99 F (37.2 C) (05/20 0545) Pulse Rate:  [81-89] 81 (05/20 0545) Resp:  [17-20] 17 (05/20 0545) BP: (106-114)/(55-75) 113/63 mmHg (05/20 0545) SpO2:  [95 %-99 %] 96 % (05/20 0545) Last BM Date: 09/03/12  Intake/Output from previous day: 05/19 0701 - 05/20 0700 In: 1494.2 [I.V.:480; TPN:1014.2] Out: 250  Intake/Output this shift:    Incision/Wound:vac in place.  LLQ wound open and clean.  Lab Results:   Recent Labs  09/03/12 0430  WBC 11.9*  HGB 11.8*  HCT 36.8*  PLT 441*   BMET  Recent Labs  09/02/12 0340 09/03/12 0430  NA 133* 136  K 3.8 3.8  CL 98 101  CO2 28 25  GLUCOSE 111* 129*  BUN 16 13  CREATININE 0.64 0.59  CALCIUM 9.3 9.1   PT/INR No results found for this basename: LABPROT, INR,  in the last 72 hours ABG No results found for this basename: PHART, PCO2, PO2, HCO3,  in the last 72 hours  Studies/Results: No results found.  Anti-infectives: Anti-infectives   Start     Dose/Rate Route Frequency Ordered Stop   08/23/12 2000  cefOXitin (MEFOXIN) 1 g in dextrose 5 % 50 mL IVPB     1 g 100 mL/hr over 30 Minutes Intravenous Every 6 hours 08/23/12 1759 08/24/12 0846   08/23/12 0513  ertapenem (INVANZ) 1 g in sodium chloride 0.9 % 50 mL IVPB     1 g 100 mL/hr over 30 Minutes Intravenous On call to O.R. 08/23/12 4098 08/23/12 0730   08/23/12 0512  cefoTEtan (CEFOTAN) 2 g in dextrose 5 % 50 mL IVPB     2 g 100 mL/hr over 30 Minutes Intravenous On call to O.R. 08/23/12 1191 08/23/12 0745      Assessment/Plan: s/p Procedure(s): PARTIAL COLECTOMY, ostomy closure, flexible and rigid sigmoidectomy, placement of biological mesh, application of wound vac debridement of left thigh wound, and appendectomy (N/A) OSTOMY closure (N/A) DEBRIDEMENT WOUND LEFT THIGH (Left) APPENDECTOMY  (N/A) APPLICATION OF WOUND VAC SL IV Change to PO pain meds Arrange for Home health ambulate Plan for discharge tomorrow  LOS: 12 days    Nina Hoar A. 09/04/2012

## 2012-09-05 LAB — GLUCOSE, CAPILLARY: Glucose-Capillary: 110 mg/dL — ABNORMAL HIGH (ref 70–99)

## 2012-09-05 MED ORDER — HYDROMORPHONE HCL 2 MG PO TABS: 2.0000 mg | ORAL_TABLET | ORAL | Status: DC | PRN

## 2012-09-05 MED ORDER — POLYETHYLENE GLYCOL 3350 17 G PO PACK: 17.0000 g | PACK | Freq: Every day | ORAL | Status: AC

## 2012-09-05 MED ORDER — FENTANYL 50 MCG/HR TD PT72: 1.0000 | MEDICATED_PATCH | TRANSDERMAL | Status: DC

## 2012-09-05 NOTE — Progress Notes (Signed)
13 Days Post-Op  Subjective: Doing ok.  Tolerating diet.  BM  Objective: Vital signs in last 24 hours: Temp:  [97.9 F (36.6 C)-99.7 F (37.6 C)] 97.9 F (36.6 C) (05/21 0552) Pulse Rate:  [80-92] 80 (05/21 0552) Resp:  [18-20] 18 (05/21 0552) BP: (111-134)/(59-80) 134/80 mmHg (05/21 0552) SpO2:  [96 %-97 %] 97 % (05/21 0552) Last BM Date: 09/03/12  Intake/Output from previous day: 05/20 0701 - 05/21 0700 In: 960 [P.O.:720; I.V.:240] Out: -  Intake/Output this shift:    Incision/Wound:VAC IN PLACE LLQ WOUND CLEAN SOFT INNER L THIGH WOUND CLEAN  Lab Results:   Recent Labs  09/03/12 0430  WBC 11.9*  HGB 11.8*  HCT 36.8*  PLT 441*   BMET  Recent Labs  09/03/12 0430  NA 136  K 3.8  CL 101  CO2 25  GLUCOSE 129*  BUN 13  CREATININE 0.59  CALCIUM 9.1   PT/INR No results found for this basename: LABPROT, INR,  in the last 72 hours ABG No results found for this basename: PHART, PCO2, PO2, HCO3,  in the last 72 hours  Studies/Results: No results found.  Anti-infectives: Anti-infectives   Start     Dose/Rate Route Frequency Ordered Stop   08/23/12 2000  cefOXitin (MEFOXIN) 1 g in dextrose 5 % 50 mL IVPB     1 g 100 mL/hr over 30 Minutes Intravenous Every 6 hours 08/23/12 1759 08/24/12 0846   08/23/12 0513  ertapenem (INVANZ) 1 g in sodium chloride 0.9 % 50 mL IVPB     1 g 100 mL/hr over 30 Minutes Intravenous On call to O.R. 08/23/12 2841 08/23/12 0730   08/23/12 0512  cefoTEtan (CEFOTAN) 2 g in dextrose 5 % 50 mL IVPB     2 g 100 mL/hr over 30 Minutes Intravenous On call to O.R. 08/23/12 3244 08/23/12 0745      Assessment/Plan: s/p Procedure(s): PARTIAL COLECTOMY, ostomy closure, flexible and rigid sigmoidectomy, placement of biological mesh, application of wound vac debridement of left thigh wound, and appendectomy (N/A) OSTOMY closure (N/A) DEBRIDEMENT WOUND LEFT THIGH (Left) APPENDECTOMY (N/A) APPLICATION OF WOUND VAC Discharge  LOS: 13 days     Murrel Bertram A. 09/05/2012

## 2012-09-05 NOTE — Progress Notes (Signed)
Patient discharged home in stable condition.  No change from morning assessment.  Discharge instructions and scripts given to patient and spouse with verbal understanding and feedback.  No further questions or concerns at this time.

## 2012-09-05 NOTE — Discharge Summary (Signed)
Physician Discharge Summary  Patient ID: [name] MRN: [id number] DOB/AGE: 25-May-1970 42 y.o.  Admit date: 08/23/2012 Discharge date: 09/05/2012  Admission Diagnoses:  Patient Active Problem List   Diagnosis Date Noted  . 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 Diverticulitis 08/25/2012  . Post-op pain 07/13/2012  . Post-operative state 06/28/2012  . Hypertriglyceridemia 01/22/2012  . Tobacco abuse 01/21/2012  . Obesity, Class III, BMI 40-49.9 (morbid obesity) 01/17/2012  . Constipation 12/24/2011  . Stricture of sigmoid colon s/p resection/colostomy 2013 12/24/2011  . 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  Pt had prolonged ileus requiring TNA over 11 days.  Bowel function slowly returned.  Wound vac in place and wounds healing.  Pain control complex due to long tern narcotic use secondary to chronic disease but did well with fentanyl patch and dilaudid.  This will be weaned slowly as outpatient.    Consults: None  Significant Diagnostic Studies: labs:  CMP     Component Value Date/Time   NA 136 09/03/2012 0430   K 3.8 09/03/2012 0430   CL 101 09/03/2012 0430   CO2 25 09/03/2012 0430   GLUCOSE 129* 09/03/2012 0430   BUN 13 09/03/2012 0430   CREATININE 0.59 09/03/2012 0430   CALCIUM 9.1 09/03/2012 0430   PROT 6.4 09/03/2012 0430   ALBUMIN 2.8* 09/03/2012 0430   AST 24 09/03/2012 0430   ALT 50 09/03/2012 0430   ALKPHOS 103 09/03/2012 0430   BILITOT 0.3 09/03/2012 0430   GFRNONAA >90 09/03/2012 0430   GFRAA >90 09/03/2012 0430     Treatments: surgery: see op note  Discharge Exam: Blood  pressure 134/80, pulse 80, temperature 97.9 F (36.6 C), temperature source Oral, resp. rate 18, height 5\' 10"  (1.778 m), weight 274 lb (124.286 kg), SpO2 97.00%. General appearance: alert and cooperative Skin: Skin color, texture, turgor normal. No rashes or lesions or LEFT THIGH WOUND CLEAN WITHOUT ERYTHEMA Incision/Wound:VAC IN PLACE. WOUNDS CLEAN. SOFT NON DISTENDED.  Disposition: 01-Home or Self Care  Discharge Orders   Future Appointments Provider Department Dept Phone   09/07/2012 11:40 AM Maisie Fus A. Dawne Casali, MD [facility], [place] [phone]   Future Orders Complete By Expires     Diet - low sodium heart healthy  As directed     Increase activity slowly  As directed         Medication List    STOP taking these medications       HYDROcodone-acetaminophen 10-325 MG per tablet  Commonly known as:  NORCO     MIRALAX powder  Generic drug:  polyethylene glycol powder      TAKE these medications       fentaNYL 50 MCG/HR  Commonly known as:  DURAGESIC - dosed mcg/hr  Place 1 patch (50 mcg total) onto the skin every 3 (three) days.     HYDROmorphone 2 MG tablet  Commonly known as:  DILAUDID  Take 1 tablet (2 mg total) by mouth every 3 (three) hours as needed.     polyethylene glycol packet  Commonly known as:  MIRALAX / GLYCOLAX  Take 17 g by mouth daily.  promethazine 12.5 MG tablet  Commonly known as:  PHENERGAN  Take 1 tablet (12.5 mg total) by mouth every 6 (six) hours as needed. For nausea.         Signed: Kymia Simi A. 09/05/2012, 7:33 AM

## 2012-09-06 ENCOUNTER — Telehealth (INDEPENDENT_AMBULATORY_CARE_PROVIDER_SITE_OTHER): Payer: Self-pay

## 2012-09-06 ENCOUNTER — Telehealth (INDEPENDENT_AMBULATORY_CARE_PROVIDER_SITE_OTHER): Payer: Self-pay | Admitting: General Surgery

## 2012-09-06 ENCOUNTER — Other Ambulatory Visit (INDEPENDENT_AMBULATORY_CARE_PROVIDER_SITE_OTHER): Payer: Self-pay

## 2012-09-06 DIAGNOSIS — Z9889 Other specified postprocedural states: Secondary | ICD-10-CM

## 2012-09-06 MED ORDER — PROMETHAZINE HCL 12.5 MG PO TABS: 12.5000 mg | ORAL_TABLET | Freq: Four times a day (QID) | ORAL | Status: DC | PRN

## 2012-09-06 NOTE — Telephone Encounter (Signed)
Pt called to report saturated dressing when he woke this morning.  It was a site he was told would likely leak, but he was not expecting that much.  Pt advised to cleanse area appropriately and cover with large, absorbant dressing; change it as needed.  He has follow up appt tomorrow with Dr. Luisa Hart.  No other problems reported.

## 2012-09-06 NOTE — Telephone Encounter (Signed)
Called patient to let him know we are cancelling his appointment for tomorrow because Dr Luisa Hart said he doesn't need to see him until next week since he was just discharged. I also let him know I put refill for phenergan in electronically to his pharmacy.

## 2012-09-07 ENCOUNTER — Encounter (INDEPENDENT_AMBULATORY_CARE_PROVIDER_SITE_OTHER): Payer: Self-pay | Admitting: Surgery

## 2012-09-12 ENCOUNTER — Ambulatory Visit (INDEPENDENT_AMBULATORY_CARE_PROVIDER_SITE_OTHER): Payer: Self-pay | Admitting: Surgery

## 2012-09-12 ENCOUNTER — Encounter (INDEPENDENT_AMBULATORY_CARE_PROVIDER_SITE_OTHER): Payer: Self-pay | Admitting: Surgery

## 2012-09-12 ENCOUNTER — Other Ambulatory Visit (INDEPENDENT_AMBULATORY_CARE_PROVIDER_SITE_OTHER): Payer: Self-pay | Admitting: Surgery

## 2012-09-12 VITALS — BP 138/84 | HR 109 | Temp 98.2°F | Resp 18 | Ht 70.0 in | Wt 259.0 lb

## 2012-09-12 DIAGNOSIS — R109 Unspecified abdominal pain: Secondary | ICD-10-CM

## 2012-09-12 DIAGNOSIS — Z9889 Other specified postprocedural states: Secondary | ICD-10-CM

## 2012-09-12 MED ORDER — HYDROMORPHONE HCL 2 MG PO TABS: 2.0000 mg | ORAL_TABLET | ORAL | Status: DC | PRN

## 2012-09-12 NOTE — Progress Notes (Signed)
Patient returns after closure of colostomy, takedown of intracutaneous fistula and abdominal wall reconstruction. He is doing fairly well with no fever or chills. He has had some mucoid type drainage from a percutaneous drain site from his left flank which carries from day-to-day to being quite light to being heavier. Has a yellow-green mucous appearance to it without blood or food particles in it. He denies fever chills. Wound VAC in place on abdomen. His pain is well-controlled on Duragesic patch and Dilaudid.  Exam: No distress. Wound VAC over her lower middle incision. Ostomy site almost completely healed. Left flank she has a chronic open track for his percutaneous drain used to be. There is a yellow-green mucoid type discharge noted. There is no redness. There appears to be no particles of food within this.  Impression: Status post were closure of colostomy secondary to diverticular stricture with EC fistula and history of retroperitoneal abscessand closure of complex abdominal wall defect with biologic mesh   Plan: Given the drainage from his old drain site I will check a CT scan with oral contrast only. He is not ill appearing and moving his bowels. We'll ask home health used both a white and black sponge. He seems to be doing okay. Return in 10 days or so.

## 2012-09-12 NOTE — Patient Instructions (Signed)
Will set up CT to evaluate drainage from drain site.

## 2012-09-13 ENCOUNTER — Telehealth (INDEPENDENT_AMBULATORY_CARE_PROVIDER_SITE_OTHER): Payer: Self-pay

## 2012-09-13 ENCOUNTER — Telehealth (INDEPENDENT_AMBULATORY_CARE_PROVIDER_SITE_OTHER): Payer: Self-pay | Admitting: General Surgery

## 2012-09-13 NOTE — Telephone Encounter (Signed)
I spoke with Huntley Dec and asked if they could use the white sponge and black sponge with patients wound vac. She said they don't have the white sponge but did have anti micro guaze that do the same thing. I paged Dr Luisa Hart and he said he was okay with it since it would do the same thing. He says [facility] told the patient about it so he is okay with using whatever the [facility] suggested.

## 2012-09-13 NOTE — Telephone Encounter (Signed)
Spoke with Morrie Sheldon she is aware of appt on 09/17/12 at 1pm [address]  Contrast at 11:15 12 :15  No solids 4 hours prior

## 2012-09-17 ENCOUNTER — Other Ambulatory Visit: Payer: Self-pay

## 2012-09-18 ENCOUNTER — Ambulatory Visit
Admission: RE | Admit: 2012-09-18 | Discharge: 2012-09-18 | Disposition: A | Discharge status: Home / Self Care | Payer: Self-pay | Source: Ambulatory Visit | Attending: Surgery | Admitting: Surgery

## 2012-09-18 ENCOUNTER — Other Ambulatory Visit (INDEPENDENT_AMBULATORY_CARE_PROVIDER_SITE_OTHER): Payer: Self-pay

## 2012-09-18 DIAGNOSIS — R109 Unspecified abdominal pain: Secondary | ICD-10-CM

## 2012-09-19 ENCOUNTER — Other Ambulatory Visit (INDEPENDENT_AMBULATORY_CARE_PROVIDER_SITE_OTHER): Payer: Self-pay

## 2012-09-19 ENCOUNTER — Telehealth (INDEPENDENT_AMBULATORY_CARE_PROVIDER_SITE_OTHER): Payer: Self-pay | Admitting: *Deleted

## 2012-09-19 ENCOUNTER — Telehealth (INDEPENDENT_AMBULATORY_CARE_PROVIDER_SITE_OTHER): Payer: Self-pay

## 2012-09-19 DIAGNOSIS — R188 Other ascites: Secondary | ICD-10-CM

## 2012-09-19 NOTE — Telephone Encounter (Signed)
Message copied by Brennan Bailey on Wed Sep 19, 2012 11:19 AM ------      Message from: Dortha Schwalbe      Created: Tue Sep 18, 2012  4:15 PM       Reviewed CT. Fluid collection noted.  Draining out old drain site.  Will need to be set up for outpatient percutaneous drainage by IR.  THANKS.       ----- Message -----         From: Rad Results In Interface         Sent: 09/18/2012   1:18 PM           To: Thomas A. Cornett, MD                   ------

## 2012-09-19 NOTE — Addendum Note (Signed)
Addended byLiliana Cline on: 09/19/2012 09:42 AM   Modules accepted: Orders

## 2012-09-19 NOTE — Telephone Encounter (Signed)
Patient's father called with several questions that he would like to speak directly with Dr. Luisa Hart regarding.  Father states that patient has been having a large amount of drainage and wants to know what the drainage actually is and if we don't know can it be tested when patient comes in to have drain tube placed.  Father also wants more information regarding what the plan moving forward is.

## 2012-09-19 NOTE — Telephone Encounter (Signed)
I called patient and let him know CT showed fluid collection and that he needs to have a drain put back in. I let him know either our office or IR at [facility] will call him with that appointment.

## 2012-09-20 ENCOUNTER — Encounter (HOSPITAL_COMMUNITY): Payer: Self-pay | Admitting: Pharmacist

## 2012-09-20 ENCOUNTER — Other Ambulatory Visit: Payer: Self-pay | Admitting: Radiology

## 2012-09-21 ENCOUNTER — Encounter (HOSPITAL_COMMUNITY): Payer: Self-pay

## 2012-09-21 ENCOUNTER — Ambulatory Visit (HOSPITAL_COMMUNITY)
Admission: RE | Admit: 2012-09-21 | Discharge: 2012-09-21 | Disposition: A | Discharge status: Home / Self Care | Payer: Medicaid Other | Source: Ambulatory Visit | Attending: Surgery | Admitting: Surgery

## 2012-09-21 ENCOUNTER — Other Ambulatory Visit (INDEPENDENT_AMBULATORY_CARE_PROVIDER_SITE_OTHER): Payer: Self-pay | Admitting: General Surgery

## 2012-09-21 VITALS — BP 94/77 | HR 85 | Temp 98.1°F | Resp 18 | Ht 70.0 in | Wt 259.0 lb

## 2012-09-21 DIAGNOSIS — Z8719 Personal history of other diseases of the digestive system: Secondary | ICD-10-CM | POA: Diagnosis not present

## 2012-09-21 DIAGNOSIS — G4700 Insomnia, unspecified: Secondary | ICD-10-CM | POA: Diagnosis not present

## 2012-09-21 DIAGNOSIS — F17200 Nicotine dependence, unspecified, uncomplicated: Secondary | ICD-10-CM | POA: Diagnosis not present

## 2012-09-21 DIAGNOSIS — K651 Peritoneal abscess: Secondary | ICD-10-CM | POA: Diagnosis not present

## 2012-09-21 DIAGNOSIS — Z87442 Personal history of urinary calculi: Secondary | ICD-10-CM | POA: Diagnosis not present

## 2012-09-21 DIAGNOSIS — Z6841 Body Mass Index (BMI) 40.0 and over, adult: Secondary | ICD-10-CM | POA: Diagnosis not present

## 2012-09-21 DIAGNOSIS — R188 Other ascites: Secondary | ICD-10-CM

## 2012-09-21 DIAGNOSIS — Z9049 Acquired absence of other specified parts of digestive tract: Secondary | ICD-10-CM | POA: Diagnosis not present

## 2012-09-21 DIAGNOSIS — Z91041 Radiographic dye allergy status: Secondary | ICD-10-CM | POA: Insufficient documentation

## 2012-09-21 DIAGNOSIS — E876 Hypokalemia: Secondary | ICD-10-CM

## 2012-09-21 LAB — CBC
Hemoglobin: 15.8 g/dL (ref 13.0–17.0)
MCH: 30.5 pg (ref 26.0–34.0)
MCV: 89.6 fL (ref 78.0–100.0)
RBC: 5.18 MIL/uL (ref 4.22–5.81)

## 2012-09-21 LAB — BASIC METABOLIC PANEL
CO2: 28 mEq/L (ref 19–32)
Glucose, Bld: 147 mg/dL — ABNORMAL HIGH (ref 70–99)
Potassium: 2.3 mEq/L — CL (ref 3.5–5.1)
Sodium: 137 mEq/L (ref 135–145)

## 2012-09-21 MED ORDER — IOHEXOL 300 MG/ML  SOLN
INTRAMUSCULAR | Status: AC | PRN
  Administered 2012-09-21: 10 mL via INTRAVENOUS

## 2012-09-21 MED ORDER — FENTANYL CITRATE 0.05 MG/ML IJ SOLN
INTRAMUSCULAR | Status: AC
  Filled 2012-09-21: qty 4

## 2012-09-21 MED ORDER — FENTANYL CITRATE 0.05 MG/ML IJ SOLN
INTRAMUSCULAR | Status: AC | PRN
  Administered 2012-09-21 (×3): 50 ug via INTRAVENOUS

## 2012-09-21 MED ORDER — POT BICARB-POT CHLORIDE 25 MEQ PO TBEF: 40.0000 meq | EFFERVESCENT_TABLET | Freq: Two times a day (BID) | ORAL | Status: DC

## 2012-09-21 MED ORDER — MIDAZOLAM HCL 2 MG/2ML IJ SOLN
INTRAMUSCULAR | Status: AC | PRN
  Administered 2012-09-21 (×2): 2 mg via INTRAVENOUS

## 2012-09-21 MED ORDER — METRONIDAZOLE IN NACL 5-0.79 MG/ML-% IV SOLN: 500.0000 mg | Freq: Three times a day (TID) | INTRAVENOUS | Status: DC

## 2012-09-21 MED ORDER — CIPROFLOXACIN IN D5W 400 MG/200ML IV SOLN
400.0000 mg | Freq: Once | INTRAVENOUS | Status: AC
  Administered 2012-09-21: 400 mg via INTRAVENOUS
  Filled 2012-09-21: qty 200

## 2012-09-21 MED ORDER — MIDAZOLAM HCL 2 MG/2ML IJ SOLN
INTRAMUSCULAR | Status: AC
  Filled 2012-09-21: qty 4

## 2012-09-21 MED ORDER — SODIUM CHLORIDE 0.9 % IV SOLN
Freq: Once | INTRAVENOUS | Status: AC
  Administered 2012-09-21: 12:00:00 via INTRAVENOUS

## 2012-09-21 MED ORDER — METRONIDAZOLE IN NACL 5-0.79 MG/ML-% IV SOLN
500.0000 mg | Freq: Once | INTRAVENOUS | Status: AC
  Administered 2012-09-21: 500 mg via INTRAVENOUS
  Filled 2012-09-21: qty 100

## 2012-09-21 MED ORDER — POTASSIUM CHLORIDE CRYS ER 20 MEQ PO TBCR
20.0000 meq | EXTENDED_RELEASE_TABLET | Freq: Once | ORAL | Status: AC
  Administered 2012-09-21: 20 meq via ORAL
  Filled 2012-09-21: qty 1

## 2012-09-21 MED ORDER — POTASSIUM CHLORIDE CRYS ER 20 MEQ PO TBCR
40.0000 meq | EXTENDED_RELEASE_TABLET | Freq: Once | ORAL | Status: AC
  Administered 2012-09-21: 40 meq via ORAL
  Filled 2012-09-21: qty 2

## 2012-09-21 NOTE — H&P (Signed)
Agree with PA note.  Will attempt to replace drain under fluoro, may require transition to CT guidance.   Signed,  Sterling Big, MD Vascular & Interventional Radiologist [facility]

## 2012-09-21 NOTE — Progress Notes (Signed)
K+ of 2.3 received from lab; reported to [facility] Turpin,NP. No new orders received at present.

## 2012-09-21 NOTE — H&P (Signed)
[name] is an 42 y.o. male.   Chief Complaint: Left abd previous drain site with new copious output Greenish x several days CT 6/3 confirms abscess Scheduled for abscess evaluation and possible new drain placement Hx diverticulitis; abscess 01/2012; colostomy then reversal 08/23/12 Developed new drainage x few weeks HPI: diverticulitis; colon surgery- reversal; obese;renal stones  Past Medical History  Diagnosis Date  . Urinary anastomotic stricture undiagnosed     hard to be cathed; urology did the last time  . Diverticulitis   . Colon obstruction 01/17/2012  . Obesity, Class III, BMI 40-49.9 (morbid obesity) 01/17/2012  . Complication of anesthesia     difficulty waking up -" irratic breathing "  . History of kidney stones   . Insomnia   . History of elevated glucose     while in hospital - no problem since  . Colostomy in place   . Intra-abdominal abscess, diverticular s/p surgical drainage 01/21/2012    Past Surgical History  Procedure Laterality Date  . Partial colectomy  01/17/2012    Procedure: PARTIAL COLECTOMY;  Surgeon: Clovis Pu. Cornett, MD;  Location: [facility];  Service: General;  Laterality: N/A;  . Colostomy  01/17/2012    Procedure: COLOSTOMY;  Surgeon: Clovis Pu. Cornett, MD;  Location: [facility];  Service: General;  Laterality: N/A;  . Stoma revision  05/16/2012    Procedure: STOMA REVISION;  Surgeon: Clovis Pu. Cornett, MD;  Location: [facility];  Service: General;  Laterality: N/A;  Dilation of ostomy  . Colectomy N/A 08/23/2012    Procedure: PARTIAL COLECTOMY, ostomy closure, flexible and rigid sigmoidectomy, placement of biological mesh, application of wound vac debridement of left thigh wound, and appendectomy;  Surgeon: Clovis Pu. Cornett, MD;  Location: [facility];  Service: General;  Laterality: N/A;  . Ostomy N/A 08/23/2012    Procedure: OSTOMY closure;  Surgeon: Clovis Pu. Cornett, MD;  Location: [facility];  Service: General;  Laterality: N/A;  . Wound debridement Left  08/23/2012    Procedure: DEBRIDEMENT WOUND LEFT THIGH;  Surgeon: Clovis Pu. Cornett, MD;  Location: [facility];  Service: General;  Laterality: Left;  . Appendectomy N/A 08/23/2012    Procedure: APPENDECTOMY;  Surgeon: Clovis Pu. Cornett, MD;  Location: [facility];  Service: General;  Laterality: N/A;  . Application of wound vac  08/23/2012    Procedure: APPLICATION OF WOUND VAC;  Surgeon: Clovis Pu. Cornett, MD;  Location: [facility];  Service: General;;    History reviewed. No pertinent family history. Social History:  reports that he has been smoking Cigarettes.  He started smoking about 8 months ago. He has a 20 pack-year smoking history. He has never used smokeless tobacco. He reports that he does not drink alcohol or use illicit drugs.  Allergies:  Allergies  Allergen Reactions  . Ivp Dye (Iodinated Diagnostic Agents) Other (See Comments)    Kidney failure     (Not in a hospital admission)  Results for orders placed during the hospital encounter of 09/21/12 (from the past 48 hour(s))  CBC     Status: Abnormal   Collection Time    09/21/12 11:08 AM      Result Value Range   WBC 13.0 (*) 4.0 - 10.5 K/uL   RBC 5.18  4.22 - 5.81 MIL/uL   Hemoglobin 15.8  13.0 - 17.0 g/dL   HCT 62.9  52.8 - 41.3 %   MCV 89.6  78.0 - 100.0 fL   MCH 30.5  26.0 - 34.0 pg  MCHC 34.1  30.0 - 36.0 g/dL   RDW 16.1  09.6 - 04.5 %   Platelets 574 (*) 150 - 400 K/uL   No results found.  Review of Systems  Constitutional: Negative for fever.  Respiratory: Negative for sputum production.   Cardiovascular: Negative for chest pain.  Gastrointestinal: Positive for nausea and abdominal pain. Negative for vomiting.  Neurological: Positive for weakness. Negative for headaches.    Blood pressure 95/63, pulse 68, temperature 98.9 F (37.2 C), temperature source Oral, height 5\' 10"  (1.778 m), weight 259 lb (117.482 kg). Physical Exam  Constitutional: He is oriented to person, place, and time.  Cardiovascular: Normal rate,  regular rhythm and normal heart sounds.   No murmur heard. Respiratory: Effort normal and breath sounds normal. He has no wheezes.  GI: Soft. Bowel sounds are normal. There is no tenderness.  Wound vac Rt abd Open drain site Lt abd: greenish output  Musculoskeletal: Normal range of motion.  Neurological: He is alert and oriented to person, place, and time.  Skin: Skin is warm.  Psychiatric: He has a normal mood and affect. His behavior is normal. Judgment and thought content normal.     Assessment/Plan Copious greenish output from previous left abd drain site Scheduled for evaluation and possible new drain placement Pt aware of procedure benefits and risks and agreeable to proceed Consent signed and in chart  Tarrah Furuta A 09/21/2012, 12:16 PM

## 2012-09-21 NOTE — Procedures (Signed)
Interventional Radiology Procedure Note  Procedure: Placement of a 23F drain into the LLQ peri-anastomotic abscess.  Complications: None Recommendations: - Maintain to gravity - Follow cultures - Abx (cipro/flagyl) x 10 days - Return to IR in 7-10 days for tube check - Flush tube BID until drainage out is only the flush volume   Signed,  Sterling Big, MD Vascular & Interventional Radiologist [facility]

## 2012-09-24 ENCOUNTER — Other Ambulatory Visit (HOSPITAL_COMMUNITY): Payer: Self-pay | Admitting: Interventional Radiology

## 2012-09-24 ENCOUNTER — Telehealth (HOSPITAL_COMMUNITY): Payer: Self-pay

## 2012-09-24 DIAGNOSIS — L0291 Cutaneous abscess, unspecified: Secondary | ICD-10-CM

## 2012-09-25 ENCOUNTER — Telehealth (INDEPENDENT_AMBULATORY_CARE_PROVIDER_SITE_OTHER): Payer: Self-pay

## 2012-09-25 LAB — CULTURE, ROUTINE-ABSCESS

## 2012-09-25 NOTE — Telephone Encounter (Signed)
I called patients father back regarding his concerns. He states Guerino can not have contrast but was told he would have it for his upcoming CT. I told him that the CT order was set up by radiology at [facility] so I wasn't sure what was going on with the CT. I told him I would call over there later on today and look into it and would call him or Rushil back. He also states Eyoel is having a hard time mentally coping with everything going on with him and that the family is concerned about him. They will discuss this with Dr Luisa Hart next week at his follow up appointment. Earvin Hansen wanted me to make sure Dr Luisa Hart knew he is very grateful for his prompt response when he called last week asking for Dr Luisa Hart to call him. He is thankful for everything Dr Luisa Hart has done for them.

## 2012-09-26 ENCOUNTER — Telehealth (INDEPENDENT_AMBULATORY_CARE_PROVIDER_SITE_OTHER): Payer: Self-pay

## 2012-09-26 DIAGNOSIS — Z9889 Other specified postprocedural states: Secondary | ICD-10-CM

## 2012-09-26 MED ORDER — HYDROMORPHONE HCL 2 MG PO TABS: 2.0000 mg | ORAL_TABLET | ORAL | Status: DC | PRN

## 2012-09-26 MED ORDER — PROMETHAZINE HCL 12.5 MG PO TABS: 12.5000 mg | ORAL_TABLET | Freq: Four times a day (QID) | ORAL | Status: DC | PRN

## 2012-09-26 MED ORDER — POTASSIUM CHLORIDE ER 10 MEQ PO TBCR: 20.0000 meq | EXTENDED_RELEASE_TABLET | Freq: Two times a day (BID) | ORAL | Status: DC

## 2012-09-26 NOTE — Telephone Encounter (Signed)
Agree 

## 2012-09-26 NOTE — Telephone Encounter (Signed)
Patients father walked in requesting refill of dilaudid and phenergan. Dr Luisa Hart is off after call so I asked Dr Abbey Chatters and he okayed the refill. Dilaudid 2 mg #20 no refill prescription was given to patients father and phenergan was called into [facility] in [place]. I talked to patient on the phone while his father was here and told him Dr Luisa Hart sent me a message last night that said he needs a prescription for KDUR 20 meq daily. I told patient that was called into pharmacy as well. Patient will follow up on Monday with Dr Luisa Hart.

## 2012-09-27 ENCOUNTER — Telehealth (INDEPENDENT_AMBULATORY_CARE_PROVIDER_SITE_OTHER): Payer: Self-pay

## 2012-09-27 NOTE — Telephone Encounter (Signed)
I called patient back regarding his concerns with his CT scheduled next week and having contrast. I let him know the order in epic says no contrast so he should be okay next week. I advised him to make sure when he gets there to go over with the people at radiology that due to kidney disorder he cannot have contrast. Patient understands.

## 2012-09-28 LAB — ANAEROBIC CULTURE

## 2012-10-01 ENCOUNTER — Encounter (INDEPENDENT_AMBULATORY_CARE_PROVIDER_SITE_OTHER): Payer: Self-pay | Admitting: Surgery

## 2012-10-01 ENCOUNTER — Ambulatory Visit (HOSPITAL_COMMUNITY)
Admission: RE | Admit: 2012-10-01 | Discharge: 2012-10-01 | Disposition: A | Discharge status: Home / Self Care | Payer: Medicaid Other | Source: Ambulatory Visit | Attending: Interventional Radiology | Admitting: Interventional Radiology

## 2012-10-01 ENCOUNTER — Ambulatory Visit (INDEPENDENT_AMBULATORY_CARE_PROVIDER_SITE_OTHER): Payer: Self-pay | Admitting: Surgery

## 2012-10-01 ENCOUNTER — Telehealth (INDEPENDENT_AMBULATORY_CARE_PROVIDER_SITE_OTHER): Payer: Self-pay | Admitting: General Surgery

## 2012-10-01 VITALS — BP 126/74 | HR 103 | Temp 97.4°F | Resp 17 | Ht 70.0 in | Wt 258.8 lb

## 2012-10-01 DIAGNOSIS — Y832 Surgical operation with anastomosis, bypass or graft as the cause of abnormal reaction of the patient, or of later complication, without mention of misadventure at the time of the procedure: Secondary | ICD-10-CM | POA: Insufficient documentation

## 2012-10-01 DIAGNOSIS — Z09 Encounter for follow-up examination after completed treatment for conditions other than malignant neoplasm: Secondary | ICD-10-CM | POA: Diagnosis not present

## 2012-10-01 DIAGNOSIS — L0291 Cutaneous abscess, unspecified: Secondary | ICD-10-CM

## 2012-10-01 DIAGNOSIS — Z4682 Encounter for fitting and adjustment of non-vascular catheter: Secondary | ICD-10-CM | POA: Insufficient documentation

## 2012-10-01 DIAGNOSIS — Z9889 Other specified postprocedural states: Secondary | ICD-10-CM

## 2012-10-01 DIAGNOSIS — Z8719 Personal history of other diseases of the digestive system: Secondary | ICD-10-CM | POA: Diagnosis not present

## 2012-10-01 DIAGNOSIS — K651 Peritoneal abscess: Secondary | ICD-10-CM | POA: Diagnosis not present

## 2012-10-01 DIAGNOSIS — T8140XA Infection following a procedure, unspecified, initial encounter: Secondary | ICD-10-CM | POA: Diagnosis not present

## 2012-10-01 DIAGNOSIS — Z9049 Acquired absence of other specified parts of digestive tract: Secondary | ICD-10-CM | POA: Insufficient documentation

## 2012-10-01 MED ORDER — OXYCODONE-ACETAMINOPHEN 10-325 MG PO TABS: 1.0000 | ORAL_TABLET | Freq: Four times a day (QID) | ORAL | Status: DC | PRN

## 2012-10-01 MED ORDER — IOHEXOL 300 MG/ML  SOLN
50.0000 mL | Freq: Once | INTRAMUSCULAR | Status: AC | PRN
  Administered 2012-10-01: 5 mL

## 2012-10-01 MED ORDER — TEMAZEPAM 7.5 MG PO CAPS: 7.5000 mg | ORAL_CAPSULE | Freq: Every evening | ORAL | Status: DC | PRN

## 2012-10-01 NOTE — Procedures (Signed)
Interventional Radiology Procedure Note  Procedure: Tube injection under fluoro followed by tube removal.  Complications: None Recommendations: - Continue abx - Dressing changes daily and PRN  Signed,  Sterling Big, MD Vascular & Interventional Radiologist [facility]

## 2012-10-01 NOTE — Telephone Encounter (Signed)
Patient called after leaving appointment with Dr.Cornett this afternoon. They took the prescriptions to [facility] [phone] to be filled. The prescription for Temazepam 7.5 mg was $300. Patient stated he has no insurance and can't afford that price. Patient stated the Pharmacist suggested another dose. I called the Pharmacy and was told Temazepam 15 mg #30 was $17.84, however it doesn't come in tablet form so it can't be cut in half. Pharmacist suggested another Benzo as in Lorazepam. I did advise patient I would send Dr.Cornett a message, but he wouldn't be able to take care of this until 6/17.

## 2012-10-01 NOTE — Patient Instructions (Signed)
Stop taking dilaudid and durgesic patch.  Start oxycodone 10 mg every 4 hours as needed.  Do not stop pain medication abruptly. Restoril for sleep at bedtime.  Do not take narcotics at bedtime. Drink plenty of  Fluids.  No lifting.  Return in 2 weeks.  Will check labs this week.

## 2012-10-01 NOTE — Progress Notes (Signed)
Patient returns for followup after colostomy closure 6 weeks ago with subsequent percutaneous drainage of intra-abdominal abscess 10 days ago. He was seen by interventional radiology today and CT contrast study through his drain revealed no fistula or residual abscess. The drain was removed. He has complained of feeling lightheaded over the weekend he stopped taking his narcotic medication on Friday. He has a sensation of his hair standing up from the back of his neck and he cannot sleep. He complains of right lower quadrant abdominal pain. He is moving his bowels. He is not vomiting. Wound VAC is in place and managed by home health. He cannot sleep.   Exam: Temperature 97.4 pulse 103 blood pressure 126/74  Gen. Appearance: Frail appearing. Pleasant  Abdomen: Wound VAC in midline with intact. Drain site shows serosanguineous drainage which is minimal. Ostomy wound closed. Right lower quadrant abdomen tender without rebound.  Pulmonary: Lung sounds clear  Cardiovascular: No murmur regular rate and rhythm  Clinical Data: Follow up recurrent intraperitoneal abscess with  percutaneous drainage catheter in place.  CT ABDOMEN AND PELVIS WITHOUT CONTRAST  Technique: Multidetector CT imaging of the abdomen and pelvis was  performed following the standard protocol without intravenous  contrast.  Comparison: Prior CT scan abdomen/pelvis 09/18/2012; IR drain  replacement 09/21/2012  Findings:  Lower Chest: The lung bases are clear. The visualized cardiac  structures within normal limits for size. No pericardial effusion.  Unremarkable distal thoracic esophagus.  Abdomen: Unenhanced CT was performed per clinician order. Lack of  IV contrast limits sensitivity and specificity, especially for  evaluation of abdominal/pelvic solid viscera. Within these  limitations, unremarkable CT appearance of the stomach, duodenum,  spleen, adrenal glands and pancreas. Relative hypoattenuation of  the hepatic  parenchyma consistent with fatty steatosis. No focal  hepatic lesion. Gallbladder is unremarkable. No intra or  extrahepatic biliary ductal dilatation.  Normal appearance of the kidneys. No hydronephrosis or  nephrolithiasis.  A percutaneous drainage catheter is noted in the left pericolic  gutter adjacent to the enteral enteric anastomosis. There is no  residual defined fluid collection in the region of the drainage  catheter, or more inferiorly within the abdomen or pelvis. There  is some trace residual soft tissue stranding/thickening around the  tract of the tube. The colocolonic anastomosis in the left lower  quadrant appears widely patent. There is minimal surrounding  interstitial stranding consistent with postsurgical change.  Midline ventral abdominal wall defect with low back and placed. A  loop of the transverse colon is again noted to be closely opposed  to the undersurface of the anterior abdominal wall suggesting  possible adhesive disease. No evidence of obstruction.  Pelvis: Under distended bladder. Unremarkable seminal vesicles.  No free fluid or suspicious adenopathy.  Bones: No acute fracture or aggressive appearing lytic or blastic  osseous lesion.  Vascular: Trace scattered atherosclerotic vascular calcification  without aneurysmal dilatation.  IMPRESSION:  No evidence of residual loculated fluid or abscess around the  course of the left percutaneous drainage catheter or within the  left hemiabdomen or anatomic pelvis.  Original Report Authenticated By: Malachy Moan, M.D.     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 We will stop the dilaudid and duragesic and start oxycodone 10 mg  Every  4  hours as needed.  Recheck labs this week.  Try restoril for sleep.  He will return in 2 weeks or sooner if necessary.  Cut back on his potassium replacement to 20 meq a day.

## 2012-10-02 ENCOUNTER — Telehealth (INDEPENDENT_AMBULATORY_CARE_PROVIDER_SITE_OTHER): Payer: Self-pay | Admitting: General Surgery

## 2012-10-02 NOTE — Telephone Encounter (Signed)
I called patient to let know I had received a response from Dr.Cornett about changing the Temazepam dosage. Dr.Cornett did ok an order for Temazepam 15 mg #30 NO REFILL, take 15 mg at bedtime for sleep PRN. I called [facility] and spoke with Maria-Pharmacist. I cancelled the first prescription for Temazepam 7.5 mg #30 and replaced it with Temazepam 15 mg #30 NO REFILL, take 15 mg at bedtime for sleep PRN.

## 2012-10-02 NOTE — Telephone Encounter (Signed)
He can have the 15 mg tabs and take 15 mg at night for sleep as needed.

## 2012-10-08 ENCOUNTER — Telehealth (INDEPENDENT_AMBULATORY_CARE_PROVIDER_SITE_OTHER): Payer: Self-pay | Admitting: General Surgery

## 2012-10-08 NOTE — Telephone Encounter (Signed)
Lindon Romp [facility] called in for an order to D/C Wound Vac related to: permanent stop on supplies because there is a FDA violation against the manufacture. Nurse will change over to NS W/D Daily until Hydrogel arrives, which will take approximately 2 days. Hydrogel will then be applied with antimicrobial gauze and changed 3 times/week. Nurse states, wound is healing well and getting smaller. Cornett,MD is unavailable so I obtained a verbal order from Blackman,MD I called Nurse back to let know Blackman,MD gave the ok. Nurse will be sending order over shortly. Nurse's call back number [phone].

## 2012-10-15 ENCOUNTER — Telehealth (INDEPENDENT_AMBULATORY_CARE_PROVIDER_SITE_OTHER): Payer: Self-pay | Admitting: General Surgery

## 2012-10-15 NOTE — Telephone Encounter (Signed)
Jennifer with [facility] called re: patient's wound. She states they started using hydrogel last Thursday and Saturday patient started having green drainage from wound. The wound bed looks great. Nurse calling to see if we can use Dakin's solution on wound or switch back to daily wet to dry dressing changes. Please advise. She also states patient does not have a car and has not made it to get labs drawn. She states she can draw them at the home. She will do that today - drawing a CBC and CMET. Please advise and call Victorino Dike at [phone].

## 2012-10-15 NOTE — Telephone Encounter (Signed)
Returned [facility]. Relayed message to stop hydrogel and go to wet to dry.

## 2012-10-15 NOTE — Telephone Encounter (Signed)
Go  To wet to dry and stop hydrogel

## 2012-10-15 NOTE — Telephone Encounter (Signed)
Nurse with [facility] called to see if Cornett,MD had updated orders for pt. I advised Nurse his orders were to stop Hydrogel and go to W/D.

## 2012-10-22 ENCOUNTER — Encounter (INDEPENDENT_AMBULATORY_CARE_PROVIDER_SITE_OTHER): Payer: Self-pay | Admitting: Surgery

## 2012-10-22 ENCOUNTER — Ambulatory Visit (INDEPENDENT_AMBULATORY_CARE_PROVIDER_SITE_OTHER): Payer: Self-pay | Admitting: Surgery

## 2012-10-22 VITALS — BP 128/78 | HR 74 | Temp 98.0°F | Resp 16 | Ht 70.0 in | Wt 265.8 lb

## 2012-10-22 DIAGNOSIS — Z9889 Other specified postprocedural states: Secondary | ICD-10-CM

## 2012-10-22 MED ORDER — OXYCODONE-ACETAMINOPHEN 5-325 MG PO TABS: 1.0000 | ORAL_TABLET | ORAL | Status: DC | PRN

## 2012-10-22 NOTE — Progress Notes (Signed)
Patient returns for followup after colostomy closure. He is doing much better. He has had no further drainage from his percutaneous site. The wound VAC from his midline incision is been removed. He is using much less pain medicine.  Exam: Open midline wound examined. It is healing quite nicely. It measures 6 cm x 3 cm and the skin level. Percutaneous site is clean without drainage. Old ostomy site is well closed.  Impression: Status post colostomy closure with chronic midline open wound healing well and intra-abdominal abscess status post drainage percutaneously resolved without fistula.  Plan: He is 2 months out, making progress. We will continue to wean  His Percocet. He is on 5/325 #30. Return in 6-8 weeks. Okay to shower.

## 2012-10-22 NOTE — Patient Instructions (Signed)
Stop dressing wound once it is closed.  Will continue to wean narcotics.  Ok to shower

## 2012-10-23 ENCOUNTER — Telehealth (INDEPENDENT_AMBULATORY_CARE_PROVIDER_SITE_OTHER): Payer: Self-pay

## 2012-10-23 NOTE — Telephone Encounter (Signed)
[name] from [facility] called stating she needs direction on if wd vac is to be continued. She has also sent order to Dr Luisa Hart to sign. I advised her I will send msg to Dr Luisa Hart and his assistant to review and advise. [name] can be reached at [phone] ext [phone].

## 2012-10-24 NOTE — Telephone Encounter (Signed)
Returned call to [place] to stop wound vac and do daily wet to dry dressing changes. She will send a medical necessity note for Dr Luisa Hart to sign off on for insurance purposes.

## 2012-10-24 NOTE — Telephone Encounter (Signed)
Wet to dry dressings  Daily. Stop vac.  I've sign  A bunch of orders for them.

## 2012-10-25 ENCOUNTER — Telehealth (INDEPENDENT_AMBULATORY_CARE_PROVIDER_SITE_OTHER): Payer: Self-pay

## 2012-10-25 NOTE — Telephone Encounter (Signed)
No mowing.

## 2012-10-25 NOTE — Telephone Encounter (Signed)
Pt's father called today.  His son is two months post op colostomy closure with wet to dry dressing changesand wants to mow a large area of land on his riding mower.  Father states his abdominal wound is still healing and he does not think his son is ready for this activity.  He would like Dr. Rosezena Sensor opinion.

## 2012-10-25 NOTE — Telephone Encounter (Signed)
Called father back and told him no mowing for Jw. He expressed his gratitude for this.

## 2012-10-31 ENCOUNTER — Encounter (INDEPENDENT_AMBULATORY_CARE_PROVIDER_SITE_OTHER): Payer: Self-pay

## 2012-11-12 ENCOUNTER — Encounter (INDEPENDENT_AMBULATORY_CARE_PROVIDER_SITE_OTHER): Payer: Self-pay | Admitting: Surgery

## 2012-11-13 ENCOUNTER — Telehealth (INDEPENDENT_AMBULATORY_CARE_PROVIDER_SITE_OTHER): Payer: Self-pay

## 2012-11-13 ENCOUNTER — Other Ambulatory Visit (INDEPENDENT_AMBULATORY_CARE_PROVIDER_SITE_OTHER): Payer: Self-pay

## 2012-11-13 DIAGNOSIS — G8918 Other acute postprocedural pain: Secondary | ICD-10-CM

## 2012-11-13 MED ORDER — OXYCODONE-ACETAMINOPHEN 5-325 MG PO TABS: 1.0000 | ORAL_TABLET | ORAL | Status: DC | PRN

## 2012-11-13 NOTE — Telephone Encounter (Signed)
Called [name] and let her know prescription is at front desk for pick up.

## 2012-11-13 NOTE — Telephone Encounter (Signed)
Pt's wife calling requesting refill on Oxycodone 5mg . The pt has 2 pills left from the script that Dr Luisa Hart gave him a few weeks a go. The pt is still having some pain from the dressing changes. The pt will wait for an answer so please call pt's wife back to let her know wether she needs to p/u the script.

## 2012-11-13 NOTE — Telephone Encounter (Signed)
Ok to refill.  5/325  Number 20 if someone can do in office

## 2012-11-15 NOTE — Telephone Encounter (Signed)
The pharmacy called because they rec'd the Oxycodone prescription and it's got Dr Rosezena Sensor dea and name but it is signed by Dr Carolynne Edouard.  I confirmed that with her that he had to sign it in his absence and gave her Dr Billey Chang dea #.

## 2012-11-23 ENCOUNTER — Telehealth (INDEPENDENT_AMBULATORY_CARE_PROVIDER_SITE_OTHER): Payer: Self-pay | Admitting: *Deleted

## 2012-11-23 NOTE — Telephone Encounter (Signed)
Morrie Sheldon called in for the patient today to ask if Dr. Luisa Hart will approve a refill of patient's Restoril.  It was prescribed by Dr. Luisa Hart on 10/01/12 due to insomnia however there is no note as to whether we will continue to refill this medication.  Morrie Sheldon made aware that Dr. Luisa Hart will not be available to review this request until Monday when he is in office.  She states understanding and agreeable with plan at this time.

## 2012-11-26 ENCOUNTER — Other Ambulatory Visit (INDEPENDENT_AMBULATORY_CARE_PROVIDER_SITE_OTHER): Payer: Self-pay

## 2012-11-26 DIAGNOSIS — G4700 Insomnia, unspecified: Secondary | ICD-10-CM

## 2012-11-26 MED ORDER — TEMAZEPAM 7.5 MG PO CAPS: 7.5000 mg | ORAL_CAPSULE | Freq: Every evening | ORAL | Status: DC | PRN

## 2012-11-26 NOTE — Telephone Encounter (Signed)
Sure ok to refill one more time

## 2012-11-26 NOTE — Telephone Encounter (Signed)
Called into pharmacy. Pt wife aware.

## 2012-11-27 ENCOUNTER — Telehealth (INDEPENDENT_AMBULATORY_CARE_PROVIDER_SITE_OTHER): Payer: Self-pay

## 2012-11-27 DIAGNOSIS — G4700 Insomnia, unspecified: Secondary | ICD-10-CM

## 2012-11-27 NOTE — Telephone Encounter (Signed)
Take 1/2 of 15 mg tabs.

## 2012-11-27 NOTE — Telephone Encounter (Signed)
The pharmacy requesting we change the Temazepam 7.5mg  to the 15mg  b/c of the cost difference. The 7.5mg  will cost the pt $300 but the 15mg  will cost $20. Please advise.

## 2012-11-27 NOTE — Telephone Encounter (Signed)
I called pharmacy back and temazepam only come in capsule and cannot be taken in halves. Please advise.Marland KitchenMarland Kitchen

## 2012-11-28 MED ORDER — ZOLPIDEM TARTRATE 5 MG PO TABS: 5.0000 mg | ORAL_TABLET | Freq: Every evening | ORAL | Status: DC | PRN

## 2012-11-28 NOTE — Telephone Encounter (Signed)
Patient wife called and told per Dr Luisa Hart that we are going to discontinue the temazepam and call in ambien 5 mg qhs #14 no refills to pharmacy. They will call to see how much prescription is before picking it up.

## 2012-11-28 NOTE — Telephone Encounter (Signed)
Needs to find primary care. Not going to keep refilling at this point. Can try ambien 5 mg qhs # 14 no refill but this will be it. TC ----- Message ----- From: Brennan Bailey, CMA Sent: 11/27/2012 3:32 PM To: Thomas A. Cornett, MD, Ethlyn Gallery, MA

## 2012-11-28 NOTE — Addendum Note (Signed)
Addended by: Brennan Bailey on: 11/28/2012 10:17 AM   Modules accepted: Orders, Medications

## 2012-12-13 ENCOUNTER — Ambulatory Visit (INDEPENDENT_AMBULATORY_CARE_PROVIDER_SITE_OTHER): Payer: Self-pay | Admitting: Surgery

## 2012-12-13 ENCOUNTER — Encounter (INDEPENDENT_AMBULATORY_CARE_PROVIDER_SITE_OTHER): Payer: Self-pay | Admitting: Surgery

## 2012-12-13 VITALS — BP 116/74 | HR 80 | Resp 14 | Ht 70.0 in | Wt 275.8 lb

## 2012-12-13 DIAGNOSIS — Z8719 Personal history of other diseases of the digestive system: Secondary | ICD-10-CM | POA: Insufficient documentation

## 2012-12-13 MED ORDER — ZOLPIDEM TARTRATE ER 12.5 MG PO TBCR: 12.5000 mg | EXTENDED_RELEASE_TABLET | Freq: Every evening | ORAL | Status: DC | PRN

## 2012-12-13 NOTE — Progress Notes (Signed)
Patient returns for followup after colostomy closure. He is doing much better. He has had no further drainage from his percutaneous site. The wound VAC from his midline incision is been removed. He is using much less pain medicine.  Exam: Open midline wound examined. It is healing quite nicely. It measures 2cm x 3 cm and the skin level. Percutaneous site is clean without drainage. Old ostomy site is well closed.  Impression: Status post colostomy closure with chronic midline open wound healing well and intra-abdominal abscess status post drainage percutaneously resolved without fistula.  Plan: He is 3 months out, making progress. Off pain meds.  Insomnia an issue but ambien helping. Increase to ambien CR 12.5 mg since 10 mg dose helping more than 5 mg.  Return in January.

## 2012-12-13 NOTE — Patient Instructions (Signed)
RETURN January.  Walk daily.

## 2013-02-04 ENCOUNTER — Encounter (INDEPENDENT_AMBULATORY_CARE_PROVIDER_SITE_OTHER): Payer: Self-pay | Admitting: Surgery

## 2013-02-12 ENCOUNTER — Telehealth (INDEPENDENT_AMBULATORY_CARE_PROVIDER_SITE_OTHER): Payer: Self-pay | Admitting: General Surgery

## 2013-02-12 NOTE — Telephone Encounter (Signed)
Patient called and stated that can you mail his letter to his home address that what we have in Epic

## 2013-02-12 NOTE — Telephone Encounter (Signed)
Letter was put in mail today!

## 2013-03-18 ENCOUNTER — Encounter (INDEPENDENT_AMBULATORY_CARE_PROVIDER_SITE_OTHER): Payer: Self-pay

## 2013-03-18 ENCOUNTER — Encounter (INDEPENDENT_AMBULATORY_CARE_PROVIDER_SITE_OTHER): Payer: Self-pay | Admitting: Surgery

## 2013-03-18 ENCOUNTER — Ambulatory Visit (INDEPENDENT_AMBULATORY_CARE_PROVIDER_SITE_OTHER): Payer: Self-pay | Admitting: Surgery

## 2013-03-18 VITALS — BP 130/84 | HR 88 | Temp 98.2°F | Resp 15 | Ht 70.0 in | Wt 300.4 lb

## 2013-03-18 DIAGNOSIS — K432 Incisional hernia without obstruction or gangrene: Secondary | ICD-10-CM

## 2013-03-18 DIAGNOSIS — Z8719 Personal history of other diseases of the digestive system: Secondary | ICD-10-CM

## 2013-03-18 NOTE — Patient Instructions (Signed)
No lifting.  Walk daily for 60 min.  Watch your food intake.  High protein diet. Out of work indefinately.  Return 3 months.

## 2013-03-18 NOTE — Progress Notes (Signed)
Subjective:     Patient ID: [name], male   DOB: 06/27/1970, 42 y.o.   MRN: [id number]  HPI  Patient returns for followup due to history of diverticulitis dating back to October 2013 underwent emergent sigmoid colectomy for stricture complicated by prolonged retroperitoneal abscess. He had multiple drainage procedures. He underwent colostomy closure in May of 2014 complicated by abscess. He is finally recovered from all this. And reclosure of his of his abdomen with a biologic prosthesis. He has gained 50 pounds in surgery. His appetite is good. Occasional episodes of fatigue. Off all pain medicine. Review of Systems  Respiratory: Negative.   Cardiovascular: Negative.   Gastrointestinal: Negative.        Objective:   Physical Exam  Constitutional: He appears well-developed and well-nourished.  Abdominal:    Skin: Skin is warm and dry.       Assessment:     History diverticulitis  Incisional hernia  Morbid obesity    Plan:     Patient is recovered. I've cautioned against his weight gain. His abdominal wall his unstable and at some point will require repair. At this point in time, he is doing well we'll see him back in 3 months to reassess his abdominal wall which may require CT scanning and planning. It isn't safe to let him go back to work at this point in time and I will see him in 3 months.

## 2013-06-24 ENCOUNTER — Encounter (INDEPENDENT_AMBULATORY_CARE_PROVIDER_SITE_OTHER): Payer: Self-pay | Admitting: Surgery

## 2013-06-24 ENCOUNTER — Ambulatory Visit (INDEPENDENT_AMBULATORY_CARE_PROVIDER_SITE_OTHER): Payer: Self-pay | Admitting: Surgery

## 2013-06-24 VITALS — BP 130/76 | HR 78 | Temp 98.4°F | Resp 18 | Ht 70.0 in | Wt 311.0 lb

## 2013-06-24 DIAGNOSIS — K432 Incisional hernia without obstruction or gangrene: Secondary | ICD-10-CM

## 2013-06-24 DIAGNOSIS — Z9889 Other specified postprocedural states: Secondary | ICD-10-CM

## 2013-06-24 MED ORDER — PROMETHAZINE HCL 12.5 MG PO TABS: 12.5000 mg | ORAL_TABLET | Freq: Four times a day (QID) | ORAL | Status: DC | PRN

## 2013-06-24 NOTE — Progress Notes (Signed)
Subjective:     Patient ID: [name], male   DOB: 07/08/1970, 43 y.o.   MRN: [id number]  HPI  Patient returns for followup due to history of diverticulitis dating back to October 2013 underwent emergent sigmoid colectomy for stricture complicated by prolonged retroperitoneal abscess. He had multiple drainage procedures. He underwent colostomy closure in May of 1740 complicated by abscess. He is finally recovered from all this. And reclosure of his of his abdomen with a biologic prosthesis. He has gained 50 pounds in surgery. His appetite is good. Occasional episodes of fatigue. Off all pain medicine. Pt has incisional hernia.  Occasional nausea.  appetite ok.  Review of Systems  Respiratory: Negative.   Cardiovascular: Negative.   Gastrointestinal: Negative.        Objective:   Physical Exam  Constitutional: He appears well-developed and well-nourished.  Abdominal:  Soft.  Large midline scar noted.  Significant rectus diastasis and reducible incisional hernia 10 cm ? No distension Neuro: A and O x 4  GCS 15  Skin: Skin is warm and dry.       Assessment:     History diverticulitis  Incisional hernia  Morbid obesity    Plan:       Check CT to evaluate abdominal pain and hernia.  This will help dictate next step if any.

## 2013-06-24 NOTE — Patient Instructions (Signed)
WILL SCHEDULE CT SCAN TO EVALUATE INCISIONAL HERNIA.  FURTHER DECISIONS AFTER THAT IS DONE.

## 2013-06-25 ENCOUNTER — Telehealth (INDEPENDENT_AMBULATORY_CARE_PROVIDER_SITE_OTHER): Payer: Self-pay | Admitting: *Deleted

## 2013-06-25 ENCOUNTER — Other Ambulatory Visit (INDEPENDENT_AMBULATORY_CARE_PROVIDER_SITE_OTHER): Payer: Self-pay | Admitting: *Deleted

## 2013-06-25 DIAGNOSIS — Z9889 Other specified postprocedural states: Secondary | ICD-10-CM

## 2013-06-25 MED ORDER — PROMETHAZINE HCL 12.5 MG PO TABS: 12.5000 mg | ORAL_TABLET | Freq: Four times a day (QID) | ORAL | Status: AC | PRN

## 2013-06-25 NOTE — Telephone Encounter (Signed)
LMOM for pt to return my call.  I was calling to notify him of the appt for his CT scan at [facility] on 07/01/13 with an arrival time of 2:15pm.  Pt is to drink 1st bottle of contrast at 12:30pm and 2nd bottle at 1:30pm.  NO solid foods 4 hours prior to scan.

## 2013-07-01 ENCOUNTER — Ambulatory Visit
Admission: RE | Admit: 2013-07-01 | Discharge: 2013-07-01 | Disposition: A | Discharge status: Home / Self Care | Payer: Self-pay | Source: Ambulatory Visit | Attending: Surgery | Admitting: Surgery

## 2013-07-01 DIAGNOSIS — K432 Incisional hernia without obstruction or gangrene: Secondary | ICD-10-CM

## 2013-07-01 DIAGNOSIS — Z9889 Other specified postprocedural states: Secondary | ICD-10-CM

## 2013-07-02 ENCOUNTER — Telehealth (INDEPENDENT_AMBULATORY_CARE_PROVIDER_SITE_OTHER): Payer: Self-pay

## 2013-07-02 NOTE — Telephone Encounter (Signed)
Called pt with below CT results.

## 2013-07-02 NOTE — Telephone Encounter (Signed)
Message copied by Carlene Coria on Tue Jul 02, 2013  2:56 PM ------      Message from: Erroll Luna A      Created: Mon Jul 01, 2013  3:49 PM       Looks ok.  No surgery needed at this point recheck in 6 months. ------

## 2013-10-14 ENCOUNTER — Ambulatory Visit (INDEPENDENT_AMBULATORY_CARE_PROVIDER_SITE_OTHER): Payer: Self-pay | Admitting: Surgery

## 2013-10-16 ENCOUNTER — Encounter (INDEPENDENT_AMBULATORY_CARE_PROVIDER_SITE_OTHER): Payer: Self-pay | Admitting: Surgery

## 2013-10-16 ENCOUNTER — Ambulatory Visit (INDEPENDENT_AMBULATORY_CARE_PROVIDER_SITE_OTHER): Payer: Self-pay | Admitting: Surgery

## 2013-10-16 VITALS — BP 125/80 | HR 96 | Temp 98.4°F | Resp 20 | Ht 70.0 in | Wt 317.2 lb

## 2013-10-16 DIAGNOSIS — M6208 Separation of muscle (nontraumatic), other site: Secondary | ICD-10-CM

## 2013-10-16 DIAGNOSIS — M6200 Separation of muscle (nontraumatic), unspecified site: Secondary | ICD-10-CM

## 2013-10-16 NOTE — Progress Notes (Signed)
Subjective:     Patient ID: [name], male   DOB: 1970/05/23, 43 y.o.   MRN: [id number]  HPI  Patient returns for followup due to history of diverticulitis dating back to October 2013 underwent emergent sigmoid colectomy for stricture complicated by prolonged retroperitoneal abscess. He had multiple drainage procedures. He underwent colostomy closure in May of 6063 complicated by abscess. He is finally recovered from all this. And reclosure of his of his abdomen with a biologic prosthesis. He has gained 50 pounds in surgery. His appetite is good. Occasional episodes of fatigue. Off all pain medicine. Pt has incisional hernia.  Occasional nausea.  appetite ok.  Review of Systems  Respiratory: Negative.   Cardiovascular: Negative.   Gastrointestinal: Negative.        Objective:   Physical Exam  Constitutional: He appears well-developed and well-nourished.  Abdominal:  Soft.  Large midline scar noted.  Significant rectus diastasis ? No distension Neuro: A and O x 4  GCS 15  Skin: Skin is warm and dry.  CLINICAL DATA: Eval incisional hernia  EXAM:  CT ABDOMEN AND PELVIS WITHOUT CONTRAST  TECHNIQUE:  Multidetector CT imaging of the abdomen and pelvis was performed  following the standard protocol without intravenous contrast.  COMPARISON: CT ABD/PELV WO CM dated 10/01/2012  FINDINGS:  Noncontrast evaluation of the liver, spleen, adrenals, pancreas,  kidneys is unremarkable.  There is no evidence of abdominal or pelvic free fluid, loculated  fluid collections, masses, nor adenopathy.  There is no evidence of bowel obstruction, enteritis, colitis,  diverticulitis, nor appendicitis within the limitations of a non  contrasted CT.  There is no evidence of abdominal aortic aneurysm.  A wide-mouth ventral abdominal wall hernia is appreciated with bowel  resting deep to the rectus abdominis sheath. The sheath is stretched  in transverse dimensions of 6.8 cm on image 45 series 3. No  further  evidence of an abdominal wall or inguinal hernia is appreciated.  There is no evidence of aggressive appearing osseous lesions.  IMPRESSION:  1. Diastases of the rectus abdominis sheath versus a wide-mouth  hernia just above the level of the umbilicus.  2. No evidence of obstructive or inflammatory abnormalities. No  further focal acute abnormalities identified.  Electronically Signed  By: Margaree Mackintosh M.D.  On: 07/01/2013 15:23      Assessment:     History diverticulitis  Rectus diastasis  Morbid obesity    Plan:       Doubt pt will ever be able to ain employment due to poor condition of abdominal wall.   Looks like diastasis on CT but extremely atrophied and not reconstructable.  He has gained weight which makes this worse.  Better off on disability.

## 2013-10-16 NOTE — Patient Instructions (Signed)
return 6 months. Try tryptophan for sleep.  Found at [facility] store.

## 2013-12-20 ENCOUNTER — Other Ambulatory Visit (INDEPENDENT_AMBULATORY_CARE_PROVIDER_SITE_OTHER): Payer: Self-pay | Admitting: Surgery

## 2014-05-12 IMAGING — XA IR ABSCESS DRAINAGE
1 series · 8 of 8 positions shown · non-contrast
Comparison: none

CLINICAL DATA: Recurrent, enlarging left upper abdominal abscess

[Series 300: line placements · 8 of 8 slices shown]
[im 1/8]
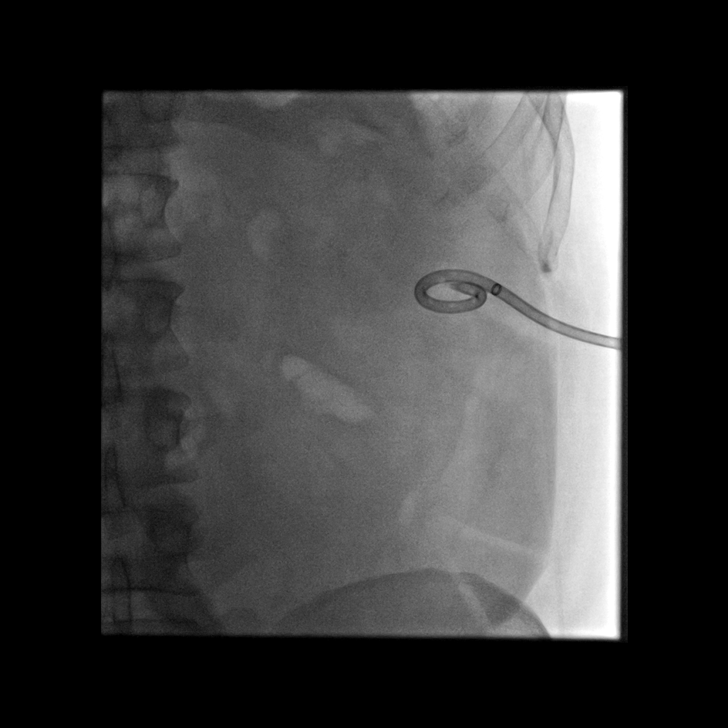
[im 2/8]
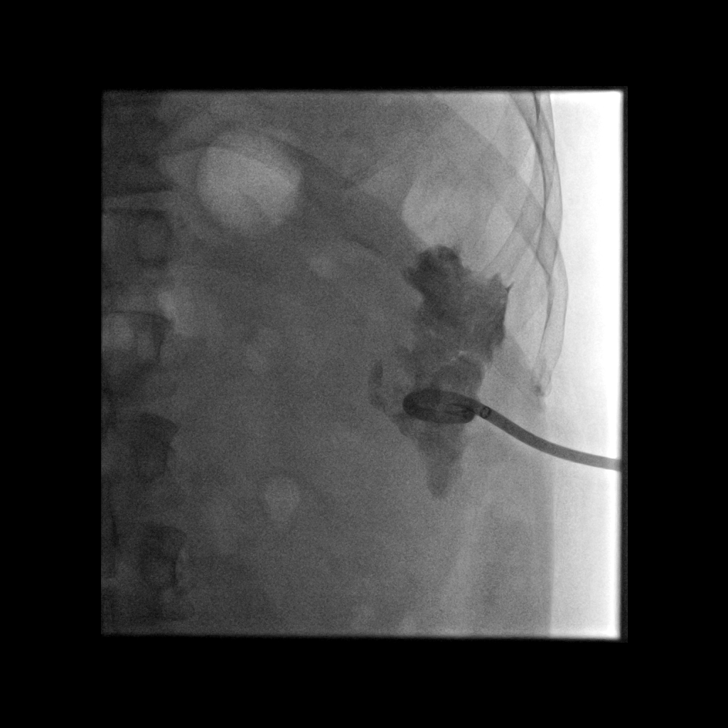
[im 3/8]
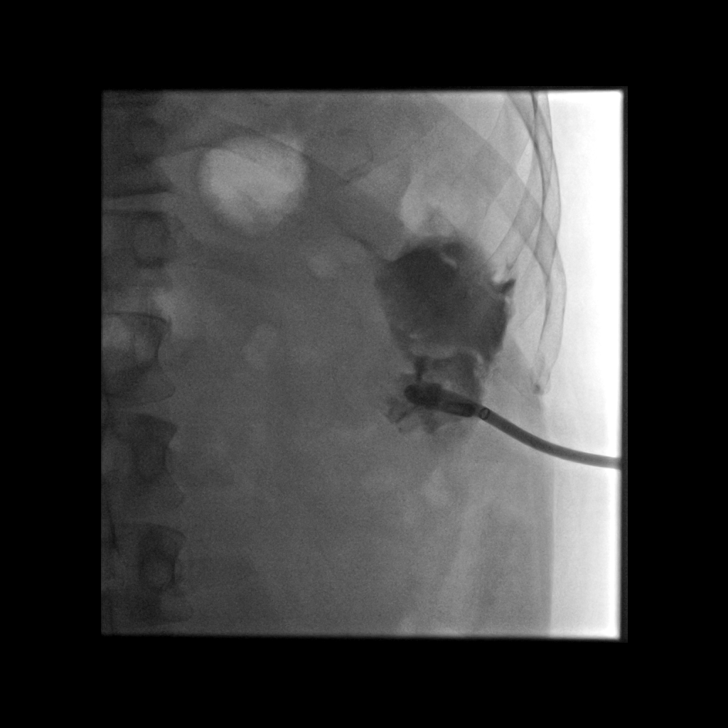
[im 4/8]
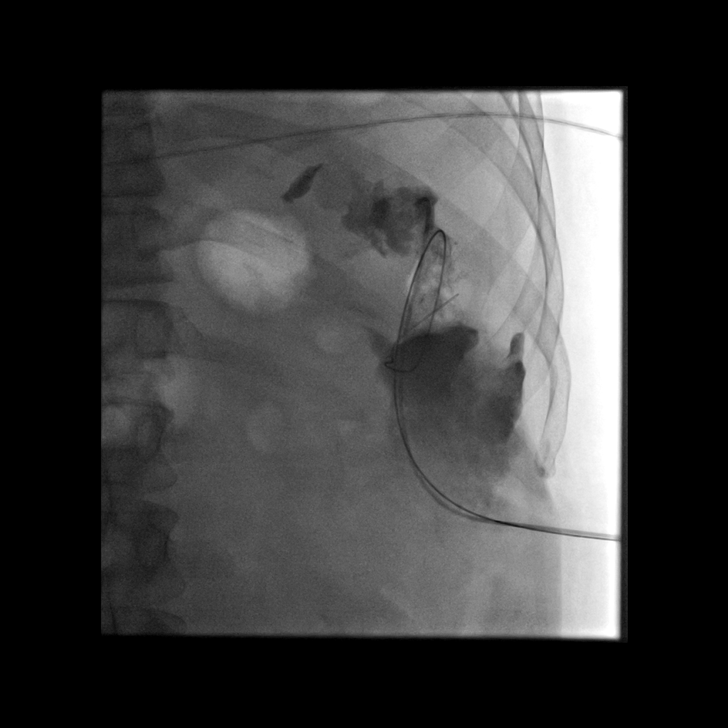
[im 5/8]
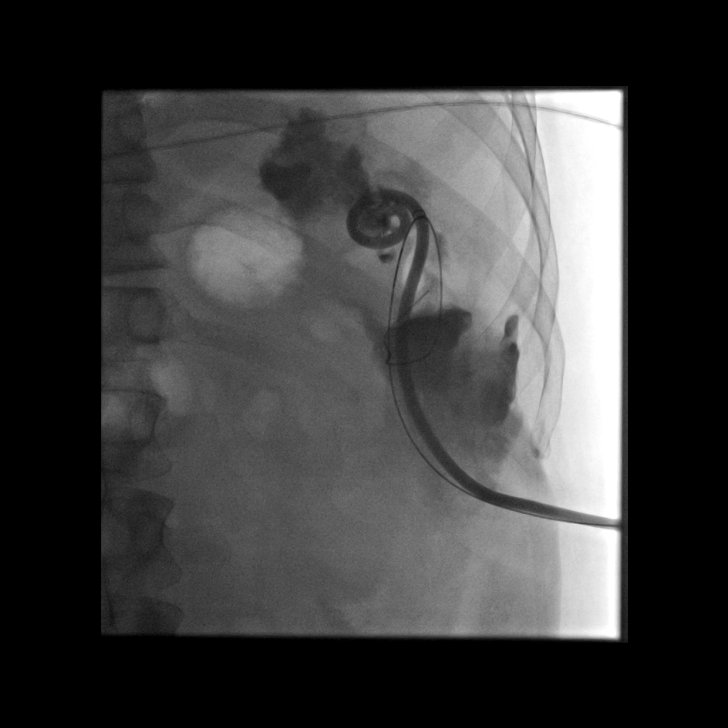
[im 6/8]
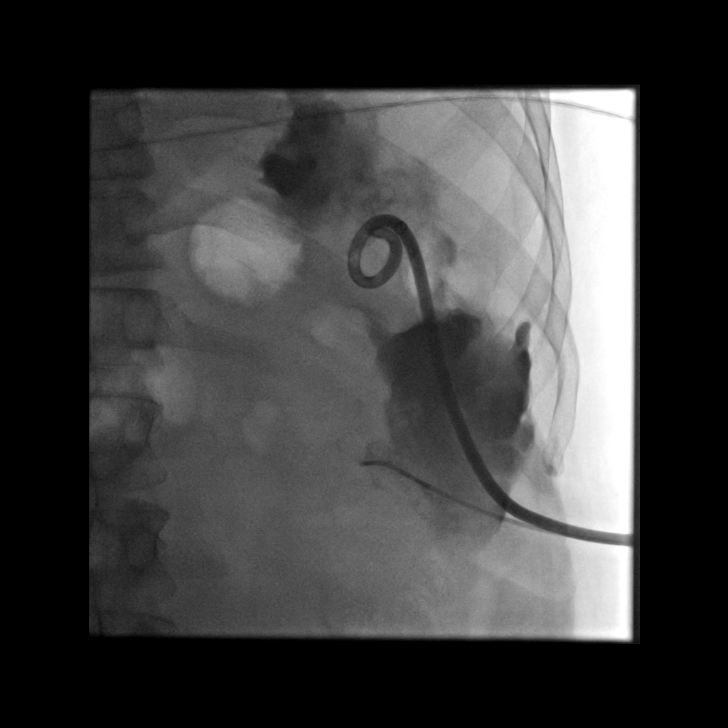
[im 7/8]
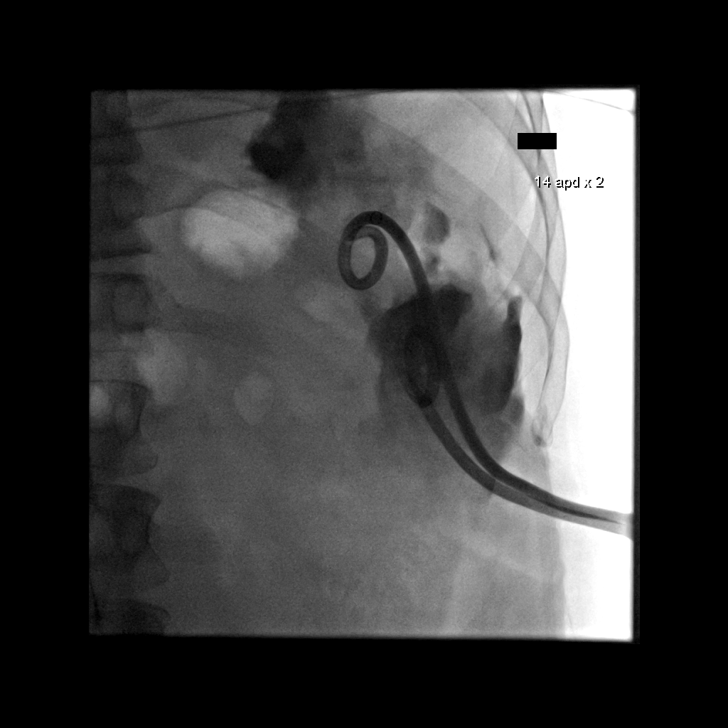
[im 8/8]
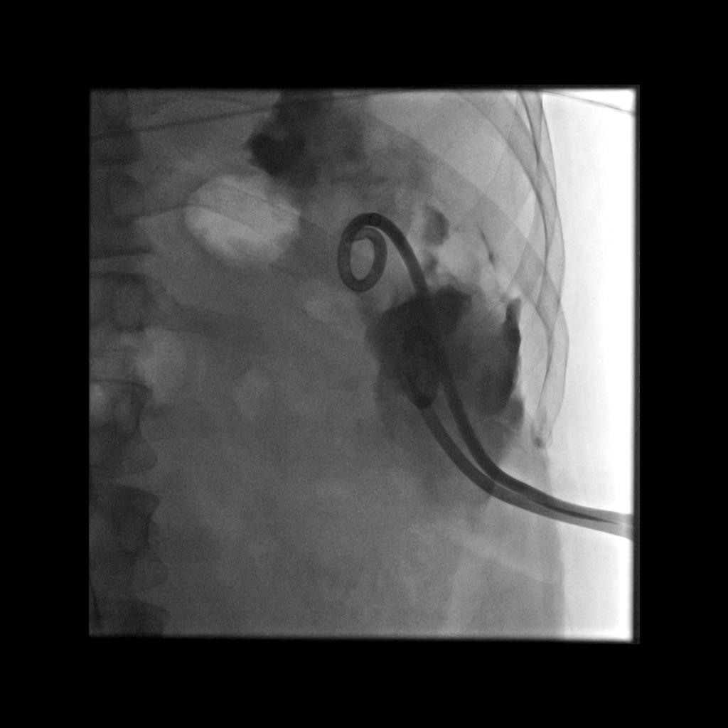

[8 of 8 positions shown; findings below may reference images not displayed]

1.  FLUOROSCOPIC GUIDED ABSCESS DRAINAGE CATHETER PLACEMENT
2.  FLUOROSCOPIC GUIDED ABSCESS DRAINAGE CATHETER EXCHANGE

Comparisons: CT abdomen pelvis - 03/06/2012; 02/22/2012;
02/09/2012; fluoroscopic guided access catheter
placement/exchanged/upsizing - 02/22/2012; 02/03/2012

Intravenous Medications: Fentanyl 300 mcg IV; Versed 2 mg IV

Contrast: 20 ml, administered into the abscess cavity within the
left upper abdominal quadrant.

Total Moderate Sedation Time: 20 minutes

Fluoroscopy Time: 4.1 minutes.

Complications: None immediate

Technique / Findings:

Informed written consent was obtained from the patient after a
discussion of the risks, benefits and alternatives to treatment.
Questions regarding the procedure were encouraged and answered.  A
timeout was performed prior to the initiation of the procedure.

The exit site of the existing left upper abdominal drainage
catheter and surrounding skin were prepped and draped in the usual
sterile fashion, and a sterile drape was applied covering the
operative field.  Maximum barrier sterile technique with sterile
gowns and gloves were used for the procedure.  A timeout was
performed prior to the initiation of the procedure.  Local
anesthesia was provided with 1% lidocaine.

A small amount of contrast was injected via the existing left upper
abdominal abscess drainage catheter. The external portion of the
existing catheter was cut and cannulated with a Benson wire.  A 9-
French vascular sheath was advanced into the fluid collection,
utilized to place an additional Benson wire.

With the use of a Kumpe catheter, a Benson wire was manipulated
into the cranial aspect of the large left upper quadrant abdominal
abscess.  Contrast injection was performed to delineate the cranial
extent of the collection.  Under intermittent fluoroscopic
guidance, the Kumpe catheter was exchanged for a new 14-French all-
purpose drainage catheter with tip coiled and locked within the
superior most aspect of the abscess.  Of note, the catheter could
not be positioned any more cranially within the abscess given the
length of this large-bore all-purpose drainage catheter.

With the use of the Kumpe catheter, with the additional Benson wire
was manipulated into the caudal aspect of the abscess. Contrast
injection was performed to demarcate the caudal extent of the
abscess.  Under intermittent fluoroscopic guidance, the Kumpe
catheter was exchanged for an additional 14-French all-purpose
drainage catheter with tip coiled and locked within the
inferior/caudal extent of the abscess collection.

Both catheters were secured to the skin with interrupted sutures.
A total of approximately 420 ml of purulent, foul-smelling fluid
was aspirated from both of the catheters.  Both catheters were
connected to a drainage bag.  Dressings were placed.  The patient
tolerated the procedure well without immediate postprocedural
complication.
IMPRESSION: Successful fluoroscopic guided repositioning and placement of two
14-French all-purpose drainage catheters with one drain positioned
within the cranial aspect of the abscess and the other within the
caudal aspect of the abscess.  A total of approximately 420 ml of
purulent, foul-smelling fluid was aspirated from both of the
catheters.

## 2014-06-10 IMAGING — CT CT ABD-PELV W/O CM
2 of 4 series · 14 of 32 positions shown, 19 images · IV contrast (READICAT &)
Comparison: 03/12/2012

CLINICAL DATA: Reevaluate abscess.  The patient reports increased
drainage.

CT ABDOMEN AND PELVIS WITHOUT CONTRAST
TECHNIQUE: Multidetector CT imaging of the abdomen and pelvis was
performed following the standard protocol without intravenous
contrast.

[Series 2: routine abdomen · axial · 0.91mm/px · z∈[-382,-27]mm · 6 of 101 slices shown, 11 images]
[im 15/101  soft-tissue]
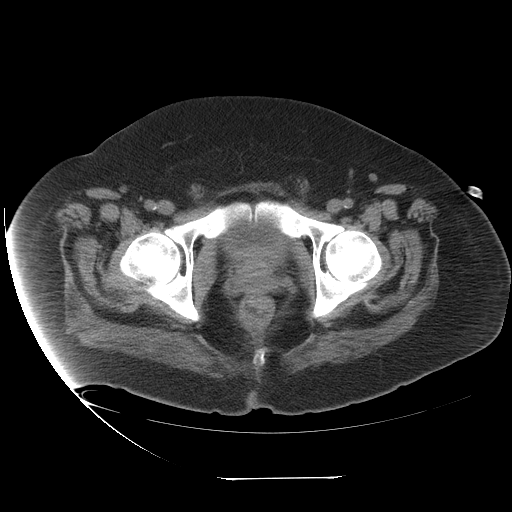
[im 15/101  bone]
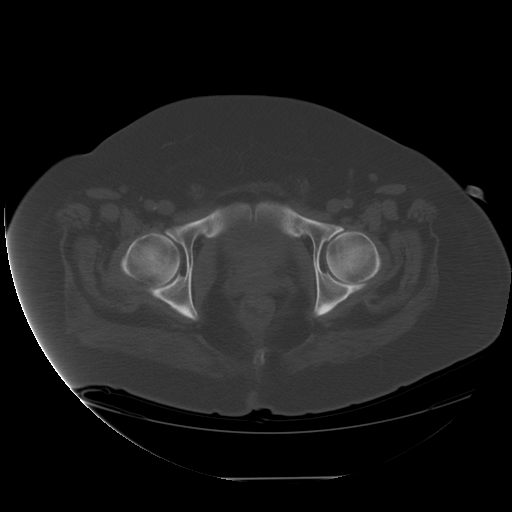
[im 29/101  soft-tissue]
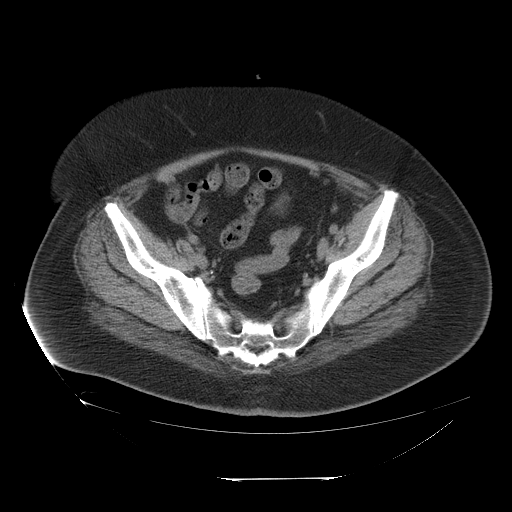
[im 43/101  soft-tissue]
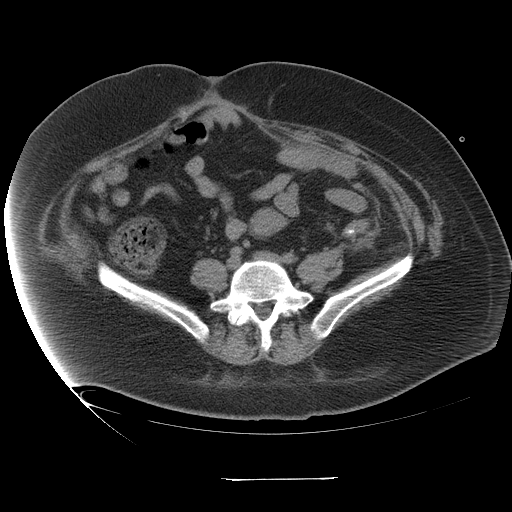
[im 43/101  lung]
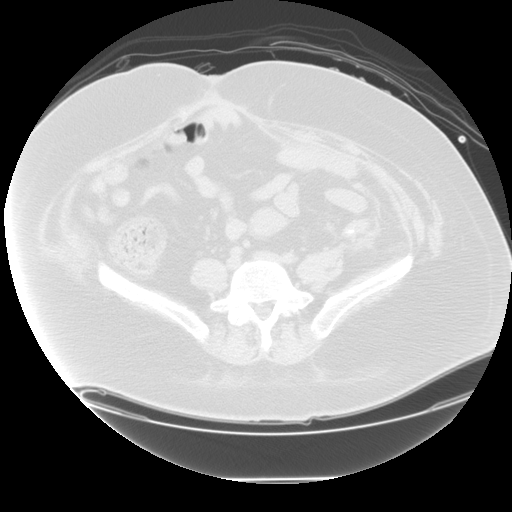
[im 58/101  soft-tissue]
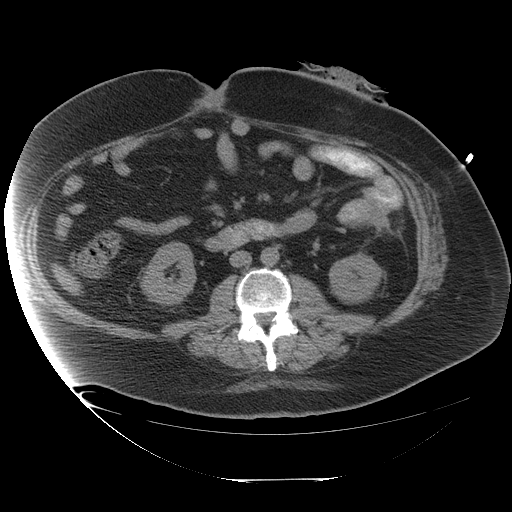
[im 58/101  lung]
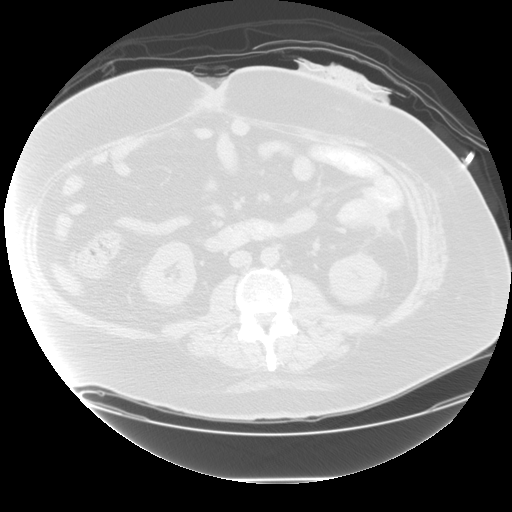
[im 72/101  soft-tissue]
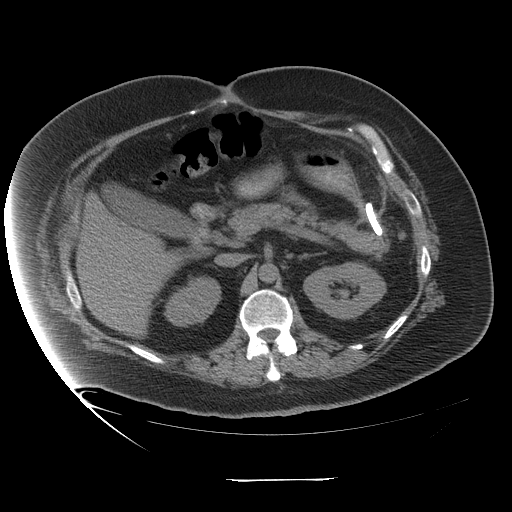
[im 72/101  lung]
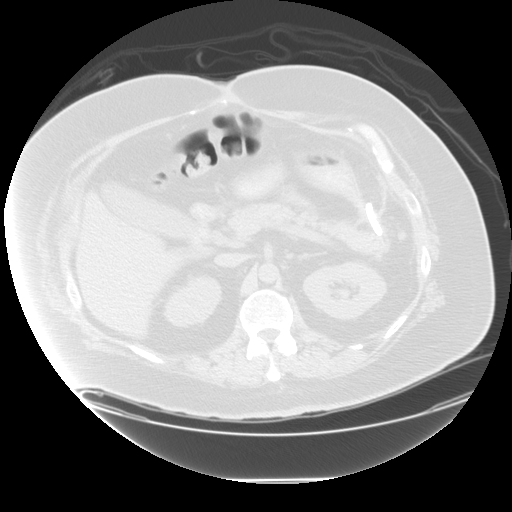
[im 86/101  soft-tissue]
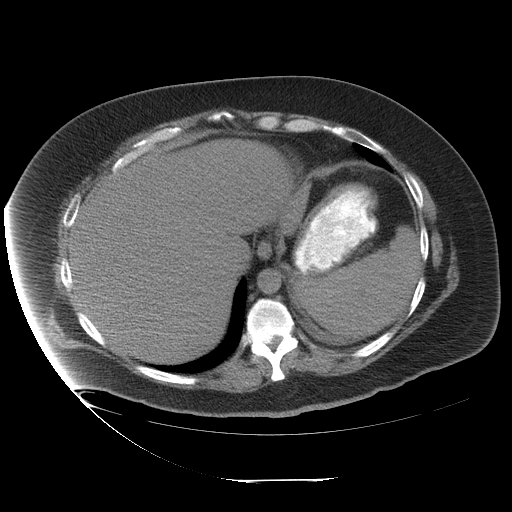
[im 86/101  lung]
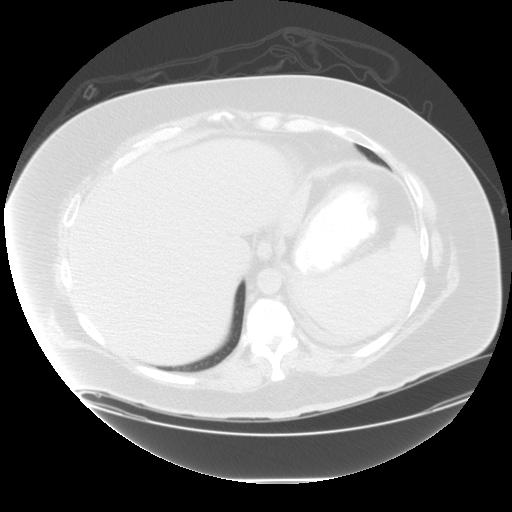

[Series 401: sagittals · sagittal · 1.01mm/px · 8 of 145 slices shown]
[im 14/145  soft-tissue]
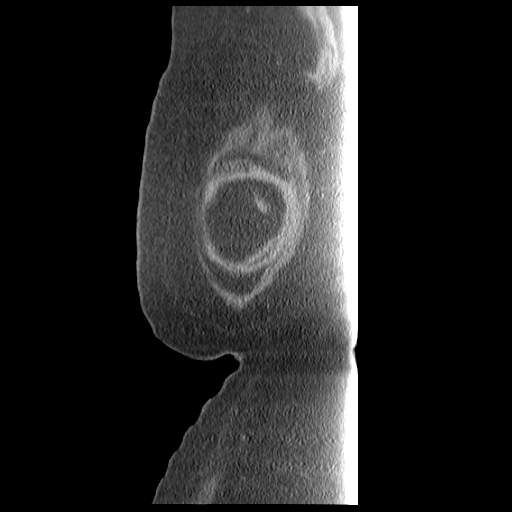
[im 27/145  soft-tissue]
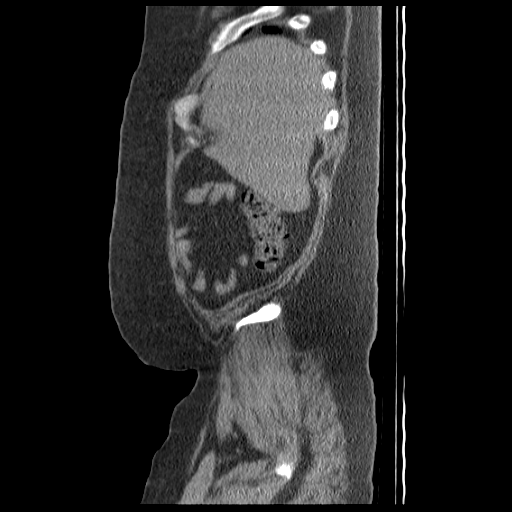
[im 53/145  soft-tissue]
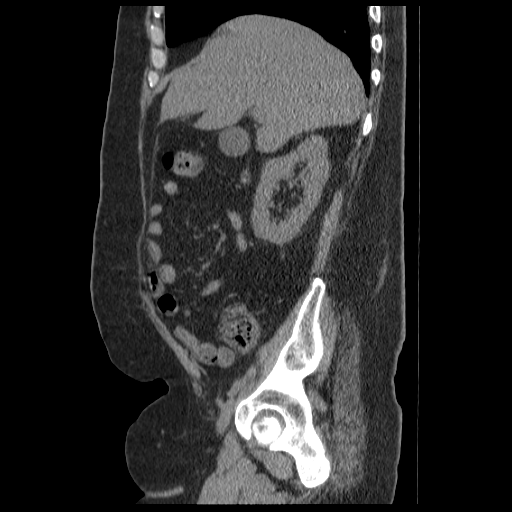
[im 66/145  soft-tissue]
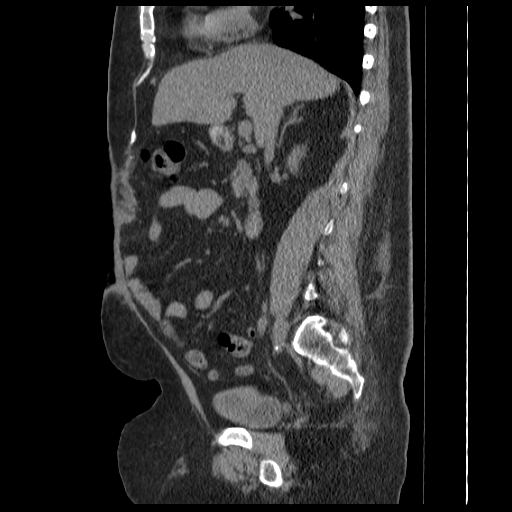
[im 79/145  soft-tissue]
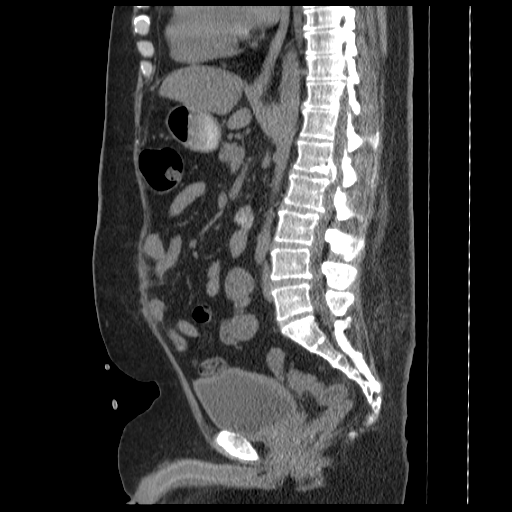
[im 92/145  soft-tissue]
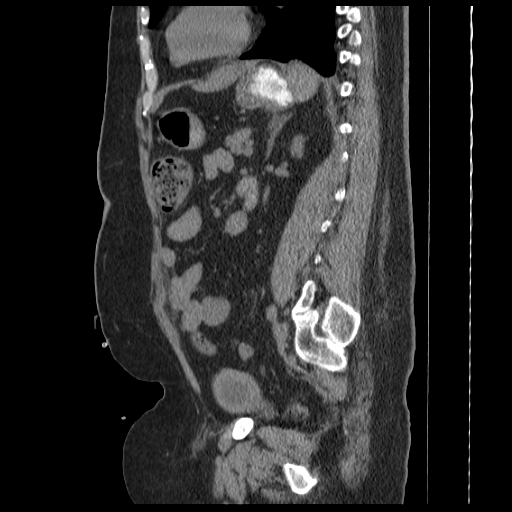
[im 118/145  soft-tissue]
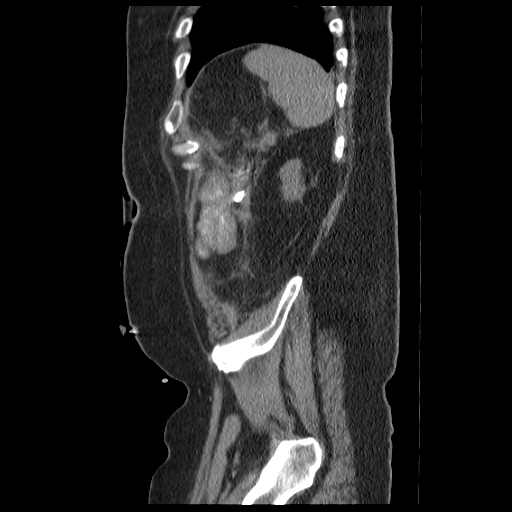
[im 131/145  soft-tissue]
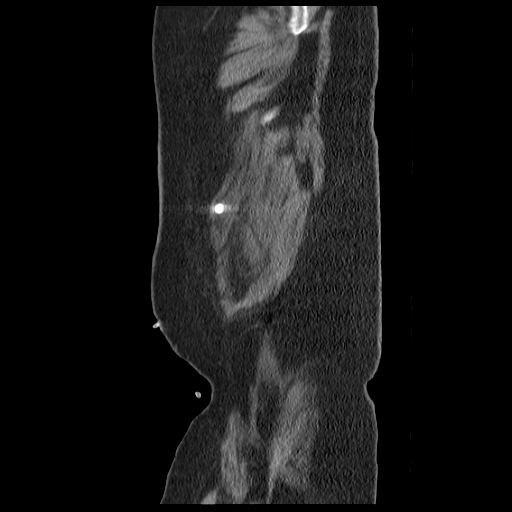

[14 of 32 positions shown; findings below may reference images not displayed]

FINDINGS: 2, percutaneously placed drains are stable from the prior
study, both curling in the left mid to upper quadrant just below
the pancreatic tail, along the left pericolic gutter.  A minimal
amount of fluid surrounds the pigtail catheters similar to the
prior exam.  Below this, along the left pericolic gutter, it is a
small amount of residual fluid measuring 4.6 cm x 1.1 cm in
greatest transverse dimension.  This is essentially stable.  There
is another collection anteriorly, along the left anterior upper
pelvis, that is also essentially stable, measuring 5.5 cm x 1.9 cm
in greatest transverse dimension.  There are no new collections.

Changes from the colon resection and the formation of a left mid
abdomen colostomy are stable.

Mild lung base subsegmental atelectasis, greater on the left.

The liver, spleen, gallbladder and pancreas unremarkable.  No
adrenal masses.  Normal kidneys and ureters.  The bladder appears
mildly thick-walled, which is nonspecific.  Consider a cystitis in
the proper clinical setting.

No enlarged lymph nodes.
IMPRESSION: Findings are without significant change from the prior exam.  The
drainage catheters are unchanged in position. Only minimal fluid
collects adjacent to the drainage catheters.  Small fluid
collections are noted, one below the catheter is along the left
pericolic gutter and the other in the anterior left lower quadrant,
both stable.  No new collections.
